# Patient Record
Sex: Male | Born: 1950
Health system: Southern US, Community
[De-identification: ages and names within clinical notes are randomized; demographics above are authoritative.]

## PROBLEM LIST (undated history)

## (undated) DIAGNOSIS — IMO0002 Reserved for concepts with insufficient information to code with codable children: Secondary | ICD-10-CM

## (undated) DIAGNOSIS — E785 Hyperlipidemia, unspecified: Secondary | ICD-10-CM

## (undated) DIAGNOSIS — D649 Anemia, unspecified: Secondary | ICD-10-CM

## (undated) DIAGNOSIS — N400 Enlarged prostate without lower urinary tract symptoms: Secondary | ICD-10-CM

## (undated) DIAGNOSIS — K449 Diaphragmatic hernia without obstruction or gangrene: Secondary | ICD-10-CM

## (undated) DIAGNOSIS — M1610 Unilateral primary osteoarthritis, unspecified hip: Secondary | ICD-10-CM

## (undated) DIAGNOSIS — R7309 Other abnormal glucose: Secondary | ICD-10-CM

## (undated) DIAGNOSIS — I2510 Atherosclerotic heart disease of native coronary artery without angina pectoris: Secondary | ICD-10-CM

## (undated) DIAGNOSIS — I10 Essential (primary) hypertension: Secondary | ICD-10-CM

## (undated) DIAGNOSIS — K219 Gastro-esophageal reflux disease without esophagitis: Secondary | ICD-10-CM

## (undated) DIAGNOSIS — M48061 Spinal stenosis, lumbar region without neurogenic claudication: Secondary | ICD-10-CM

## (undated) DIAGNOSIS — F528 Other sexual dysfunction not due to a substance or known physiological condition: Secondary | ICD-10-CM

## (undated) DIAGNOSIS — I739 Peripheral vascular disease, unspecified: Secondary | ICD-10-CM

## (undated) DIAGNOSIS — R011 Cardiac murmur, unspecified: Secondary | ICD-10-CM

## (undated) DIAGNOSIS — M169 Osteoarthritis of hip, unspecified: Secondary | ICD-10-CM

## (undated) DIAGNOSIS — I639 Cerebral infarction, unspecified: Secondary | ICD-10-CM

## (undated) DIAGNOSIS — K5730 Diverticulosis of large intestine without perforation or abscess without bleeding: Secondary | ICD-10-CM

## (undated) HISTORY — DX: Spinal stenosis, lumbar region without neurogenic claudication: M48.061

## (undated) HISTORY — DX: Other abnormal glucose: R73.09

## (undated) HISTORY — DX: Reserved for concepts with insufficient information to code with codable children: IMO0002

## (undated) HISTORY — DX: Diaphragmatic hernia without obstruction or gangrene: K44.9

## (undated) HISTORY — DX: Unilateral primary osteoarthritis, unspecified hip: M16.10

## (undated) HISTORY — DX: Hyperlipidemia, unspecified: E78.5

## (undated) HISTORY — DX: Anemia, unspecified: D64.9

## (undated) HISTORY — DX: Benign prostatic hyperplasia without lower urinary tract symptoms: N40.0

## (undated) HISTORY — DX: Atherosclerotic heart disease of native coronary artery without angina pectoris: I25.10

## (undated) HISTORY — DX: Essential (primary) hypertension: I10

## (undated) HISTORY — DX: Other sexual dysfunction not due to a substance or known physiological condition: F52.8

## (undated) HISTORY — DX: Cardiac murmur, unspecified: R01.1

## (undated) HISTORY — DX: Gastro-esophageal reflux disease without esophagitis: K21.9

## (undated) HISTORY — DX: Osteoarthritis of hip, unspecified: M16.9

## (undated) HISTORY — PX: UPPER GASTROINTESTINAL ENDOSCOPY: SHX188

## (undated) HISTORY — DX: Diverticulosis of large intestine without perforation or abscess without bleeding: K57.30

## (undated) HISTORY — DX: Peripheral vascular disease, unspecified: I73.9

---

## 2000-02-07 HISTORY — PX: COLONOSCOPY: SHX174

## 2001-11-01 ENCOUNTER — Ambulatory Visit (HOSPITAL_COMMUNITY): Admission: RE | Admit: 2001-11-01 | Discharge: 2001-11-01 | Payer: Self-pay | Admitting: Internal Medicine

## 2001-11-01 ENCOUNTER — Encounter: Payer: Self-pay | Admitting: Internal Medicine

## 2002-11-27 ENCOUNTER — Encounter: Payer: Self-pay | Admitting: Internal Medicine

## 2004-09-19 ENCOUNTER — Ambulatory Visit: Payer: Self-pay | Admitting: Internal Medicine

## 2004-12-12 ENCOUNTER — Ambulatory Visit: Payer: Self-pay | Admitting: Internal Medicine

## 2004-12-22 ENCOUNTER — Ambulatory Visit: Payer: Self-pay | Admitting: Internal Medicine

## 2005-03-28 ENCOUNTER — Ambulatory Visit: Payer: Self-pay | Admitting: Internal Medicine

## 2005-10-26 ENCOUNTER — Ambulatory Visit: Payer: Self-pay | Admitting: Internal Medicine

## 2006-08-03 ENCOUNTER — Encounter: Payer: Self-pay | Admitting: Internal Medicine

## 2007-01-22 ENCOUNTER — Ambulatory Visit: Payer: Self-pay | Admitting: Internal Medicine

## 2007-01-22 DIAGNOSIS — M1610 Unilateral primary osteoarthritis, unspecified hip: Secondary | ICD-10-CM | POA: Insufficient documentation

## 2007-01-22 DIAGNOSIS — I10 Essential (primary) hypertension: Secondary | ICD-10-CM

## 2007-01-22 DIAGNOSIS — N400 Enlarged prostate without lower urinary tract symptoms: Secondary | ICD-10-CM

## 2007-01-22 DIAGNOSIS — M169 Osteoarthritis of hip, unspecified: Secondary | ICD-10-CM

## 2007-01-22 DIAGNOSIS — E7849 Other hyperlipidemia: Secondary | ICD-10-CM

## 2007-01-22 DIAGNOSIS — F528 Other sexual dysfunction not due to a substance or known physiological condition: Secondary | ICD-10-CM

## 2007-01-28 ENCOUNTER — Encounter (INDEPENDENT_AMBULATORY_CARE_PROVIDER_SITE_OTHER): Payer: Self-pay | Admitting: *Deleted

## 2007-01-28 ENCOUNTER — Ambulatory Visit: Payer: Self-pay | Admitting: Internal Medicine

## 2007-02-04 ENCOUNTER — Encounter (INDEPENDENT_AMBULATORY_CARE_PROVIDER_SITE_OTHER): Payer: Self-pay | Admitting: *Deleted

## 2007-02-07 HISTORY — PX: TOTAL HIP ARTHROPLASTY: SHX124

## 2007-05-06 ENCOUNTER — Ambulatory Visit: Payer: Self-pay | Admitting: Internal Medicine

## 2007-05-06 DIAGNOSIS — K219 Gastro-esophageal reflux disease without esophagitis: Secondary | ICD-10-CM

## 2007-05-09 ENCOUNTER — Encounter (INDEPENDENT_AMBULATORY_CARE_PROVIDER_SITE_OTHER): Payer: Self-pay | Admitting: *Deleted

## 2007-05-15 ENCOUNTER — Ambulatory Visit: Payer: Self-pay | Admitting: Internal Medicine

## 2007-05-15 ENCOUNTER — Encounter (INDEPENDENT_AMBULATORY_CARE_PROVIDER_SITE_OTHER): Payer: Self-pay | Admitting: *Deleted

## 2007-05-15 LAB — CONVERTED CEMR LAB
OCCULT 1: NEGATIVE
OCCULT 2: NEGATIVE
OCCULT 3: NEGATIVE

## 2007-05-23 ENCOUNTER — Telehealth (INDEPENDENT_AMBULATORY_CARE_PROVIDER_SITE_OTHER): Payer: Self-pay | Admitting: *Deleted

## 2007-05-31 ENCOUNTER — Telehealth (INDEPENDENT_AMBULATORY_CARE_PROVIDER_SITE_OTHER): Payer: Self-pay | Admitting: *Deleted

## 2007-05-31 ENCOUNTER — Ambulatory Visit: Payer: Self-pay | Admitting: Gastroenterology

## 2007-06-05 ENCOUNTER — Encounter: Payer: Self-pay | Admitting: Internal Medicine

## 2007-06-05 ENCOUNTER — Ambulatory Visit: Payer: Self-pay | Admitting: Gastroenterology

## 2007-07-09 ENCOUNTER — Ambulatory Visit: Payer: Self-pay | Admitting: Internal Medicine

## 2007-07-10 ENCOUNTER — Ambulatory Visit: Payer: Self-pay | Admitting: Internal Medicine

## 2007-07-10 ENCOUNTER — Encounter: Payer: Self-pay | Admitting: Internal Medicine

## 2007-07-10 ENCOUNTER — Telehealth: Payer: Self-pay | Admitting: Internal Medicine

## 2007-07-10 LAB — CONVERTED CEMR LAB
ALT: 20 units/L (ref 0–53)
AST: 23 units/L (ref 0–37)
Alkaline Phosphatase: 89 units/L (ref 39–117)
BUN: 18 mg/dL (ref 6–23)
Basophils Relative: 0.5 % (ref 0.0–1.0)
CO2: 29 meq/L (ref 19–32)
Chloride: 107 meq/L (ref 96–112)
Creatinine, Ser: 1.2 mg/dL (ref 0.4–1.5)
Eosinophils Absolute: 0.2 10*3/uL (ref 0.0–0.7)
Eosinophils Relative: 2.8 % (ref 0.0–5.0)
GFR calc non Af Amer: 66 mL/min
Lymphocytes Relative: 20.9 % (ref 12.0–46.0)
MCV: 86.9 fL (ref 78.0–100.0)
Neutrophils Relative %: 66.9 % (ref 43.0–77.0)
Platelets: 281 10*3/uL (ref 150–400)
Potassium: 3.6 meq/L (ref 3.5–5.1)
RBC: 4.42 M/uL (ref 4.22–5.81)
Total Bilirubin: 0.8 mg/dL (ref 0.3–1.2)
WBC: 7.3 10*3/uL (ref 4.5–10.5)

## 2007-08-19 DIAGNOSIS — K2990 Gastroduodenitis, unspecified, without bleeding: Secondary | ICD-10-CM

## 2007-08-19 DIAGNOSIS — K5730 Diverticulosis of large intestine without perforation or abscess without bleeding: Secondary | ICD-10-CM | POA: Insufficient documentation

## 2007-08-19 DIAGNOSIS — K648 Other hemorrhoids: Secondary | ICD-10-CM | POA: Insufficient documentation

## 2007-08-19 DIAGNOSIS — K2970 Gastritis, unspecified, without bleeding: Secondary | ICD-10-CM | POA: Insufficient documentation

## 2007-08-19 DIAGNOSIS — K449 Diaphragmatic hernia without obstruction or gangrene: Secondary | ICD-10-CM | POA: Insufficient documentation

## 2007-08-20 ENCOUNTER — Ambulatory Visit: Payer: Self-pay | Admitting: Gastroenterology

## 2007-08-20 DIAGNOSIS — R079 Chest pain, unspecified: Secondary | ICD-10-CM

## 2007-08-22 ENCOUNTER — Ambulatory Visit: Payer: Self-pay | Admitting: Cardiovascular Disease

## 2007-08-28 ENCOUNTER — Encounter: Payer: Self-pay | Admitting: Internal Medicine

## 2007-08-28 ENCOUNTER — Ambulatory Visit: Payer: Self-pay

## 2007-08-28 ENCOUNTER — Ambulatory Visit: Payer: Self-pay | Admitting: Cardiology

## 2007-08-28 ENCOUNTER — Inpatient Hospital Stay (HOSPITAL_COMMUNITY): Admission: AD | Admit: 2007-08-28 | Discharge: 2007-08-29 | Payer: Self-pay | Admitting: Cardiovascular Disease

## 2007-09-04 ENCOUNTER — Ambulatory Visit: Payer: Self-pay | Admitting: Cardiovascular Disease

## 2007-09-10 ENCOUNTER — Ambulatory Visit: Payer: Self-pay | Admitting: Cardiovascular Disease

## 2007-09-10 ENCOUNTER — Ambulatory Visit (HOSPITAL_COMMUNITY): Admission: RE | Admit: 2007-09-10 | Discharge: 2007-09-11 | Payer: Self-pay | Admitting: Cardiovascular Disease

## 2007-09-25 ENCOUNTER — Ambulatory Visit: Payer: Self-pay | Admitting: Cardiovascular Disease

## 2007-10-10 ENCOUNTER — Encounter (INDEPENDENT_AMBULATORY_CARE_PROVIDER_SITE_OTHER): Payer: Self-pay | Admitting: *Deleted

## 2007-12-11 ENCOUNTER — Ambulatory Visit: Payer: Self-pay

## 2007-12-11 ENCOUNTER — Ambulatory Visit: Payer: Self-pay | Admitting: Cardiovascular Disease

## 2008-02-07 HISTORY — PX: CORONARY STENT PLACEMENT: SHX1402

## 2008-03-17 DIAGNOSIS — I2510 Atherosclerotic heart disease of native coronary artery without angina pectoris: Secondary | ICD-10-CM

## 2008-03-18 ENCOUNTER — Encounter: Payer: Self-pay | Admitting: Cardiovascular Disease

## 2008-03-18 ENCOUNTER — Ambulatory Visit: Payer: Self-pay | Admitting: Cardiovascular Disease

## 2008-07-29 ENCOUNTER — Ambulatory Visit: Payer: Self-pay | Admitting: Internal Medicine

## 2008-07-29 DIAGNOSIS — IMO0002 Reserved for concepts with insufficient information to code with codable children: Secondary | ICD-10-CM

## 2008-07-29 DIAGNOSIS — M25559 Pain in unspecified hip: Secondary | ICD-10-CM

## 2008-08-03 ENCOUNTER — Encounter: Payer: Self-pay | Admitting: Internal Medicine

## 2008-08-04 ENCOUNTER — Ambulatory Visit: Payer: Self-pay | Admitting: Internal Medicine

## 2008-08-05 ENCOUNTER — Telehealth: Payer: Self-pay | Admitting: Internal Medicine

## 2008-08-05 DIAGNOSIS — M48061 Spinal stenosis, lumbar region without neurogenic claudication: Secondary | ICD-10-CM | POA: Insufficient documentation

## 2008-08-27 ENCOUNTER — Encounter: Payer: Self-pay | Admitting: Cardiovascular Disease

## 2008-08-28 ENCOUNTER — Encounter: Payer: Self-pay | Admitting: Internal Medicine

## 2008-09-29 ENCOUNTER — Ambulatory Visit: Payer: Self-pay | Admitting: Cardiovascular Disease

## 2008-10-01 ENCOUNTER — Telehealth (INDEPENDENT_AMBULATORY_CARE_PROVIDER_SITE_OTHER): Payer: Self-pay

## 2008-10-05 ENCOUNTER — Ambulatory Visit: Payer: Self-pay

## 2008-10-05 ENCOUNTER — Encounter: Payer: Self-pay | Admitting: Cardiology

## 2008-10-13 ENCOUNTER — Inpatient Hospital Stay (HOSPITAL_COMMUNITY): Admission: RE | Admit: 2008-10-13 | Discharge: 2008-10-15 | Payer: Self-pay | Admitting: Orthopedic Surgery

## 2008-10-19 ENCOUNTER — Telehealth: Payer: Self-pay | Admitting: Cardiovascular Disease

## 2009-08-08 IMAGING — CR DG CHEST 2V
2 series · 2 of 2 positions shown · non-contrast
Comparison: Chest x-ray of 07/10/2007

CLINICAL DATA: Chest pain, post cardiac catheterization

CHEST - 2 VIEW

[w chest pa]
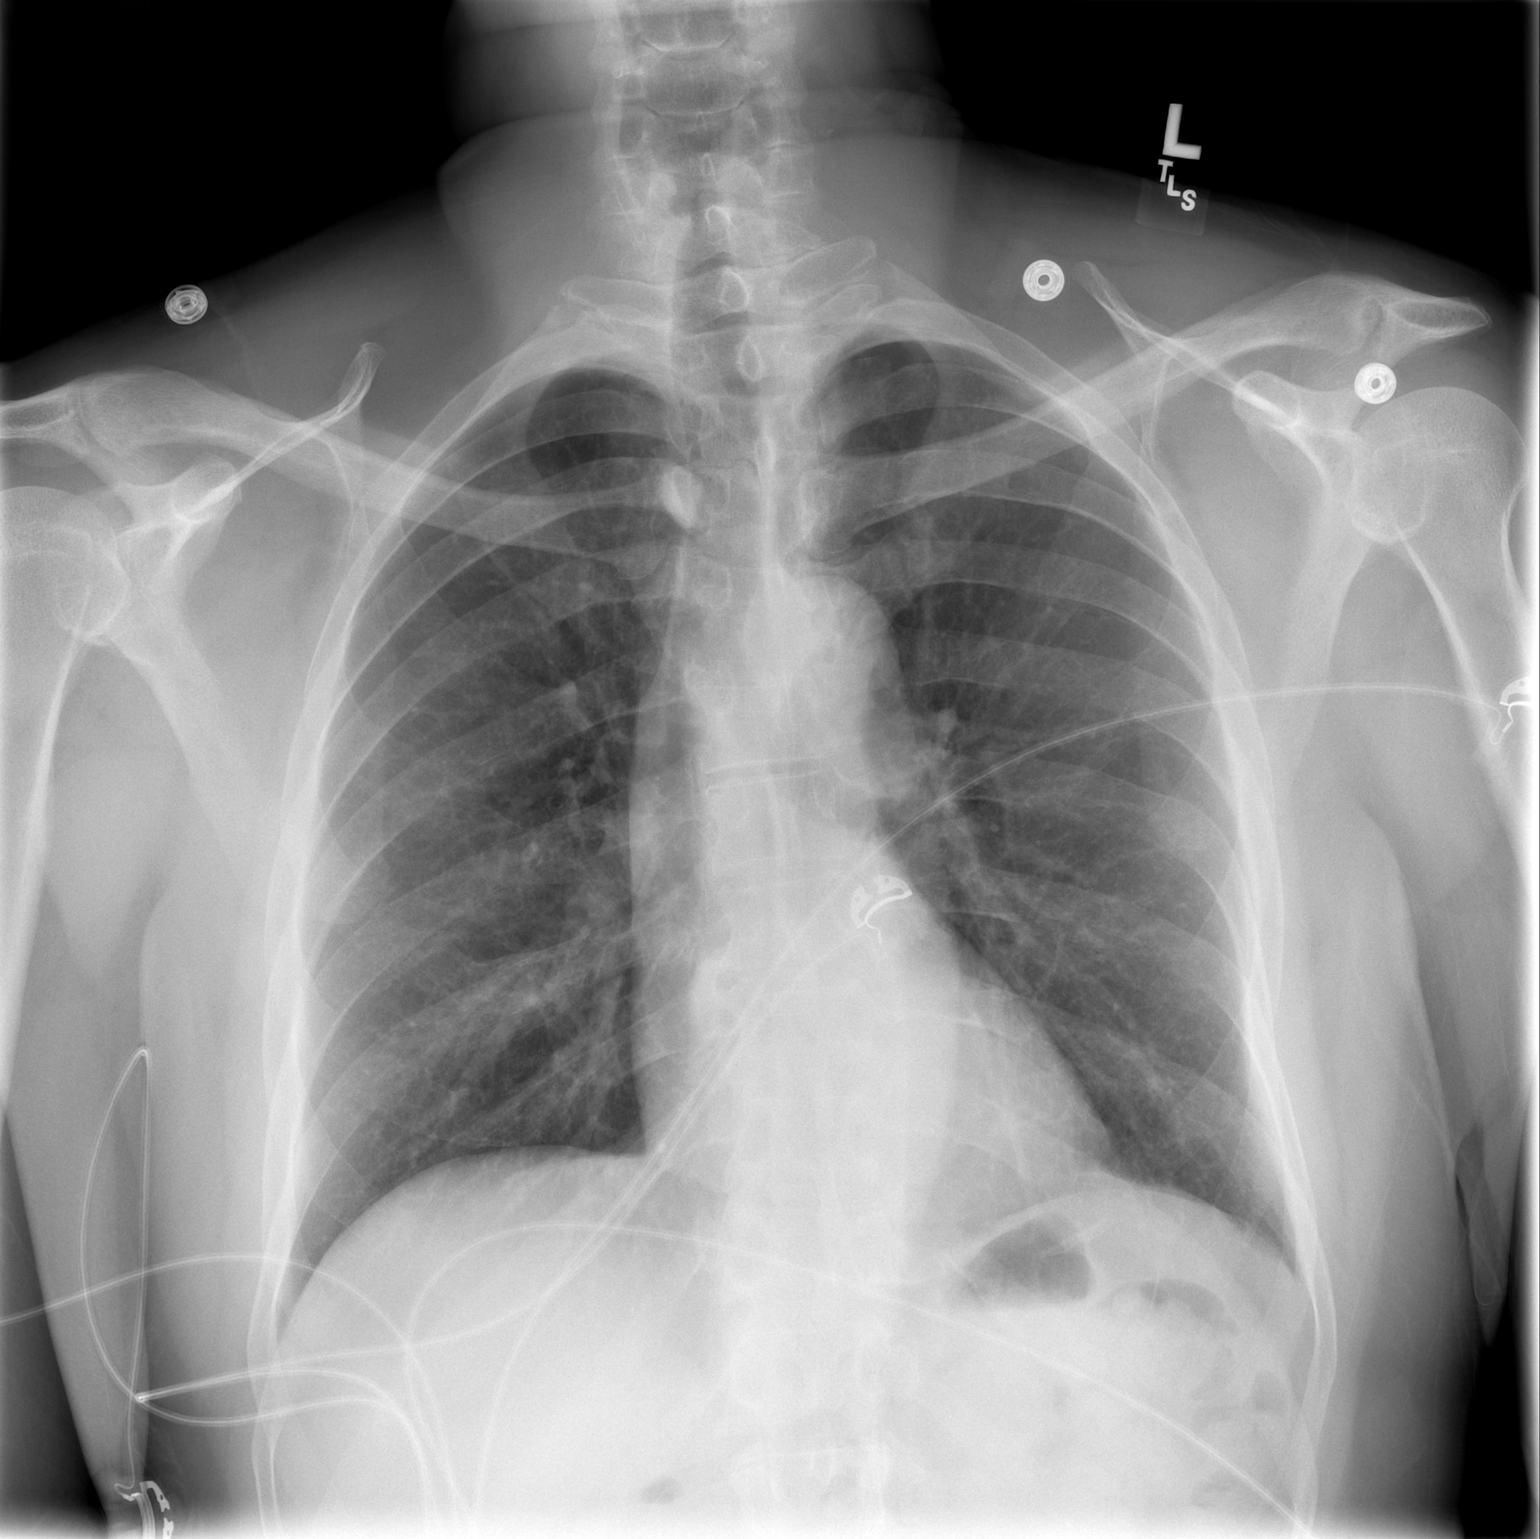

[w chest lat]
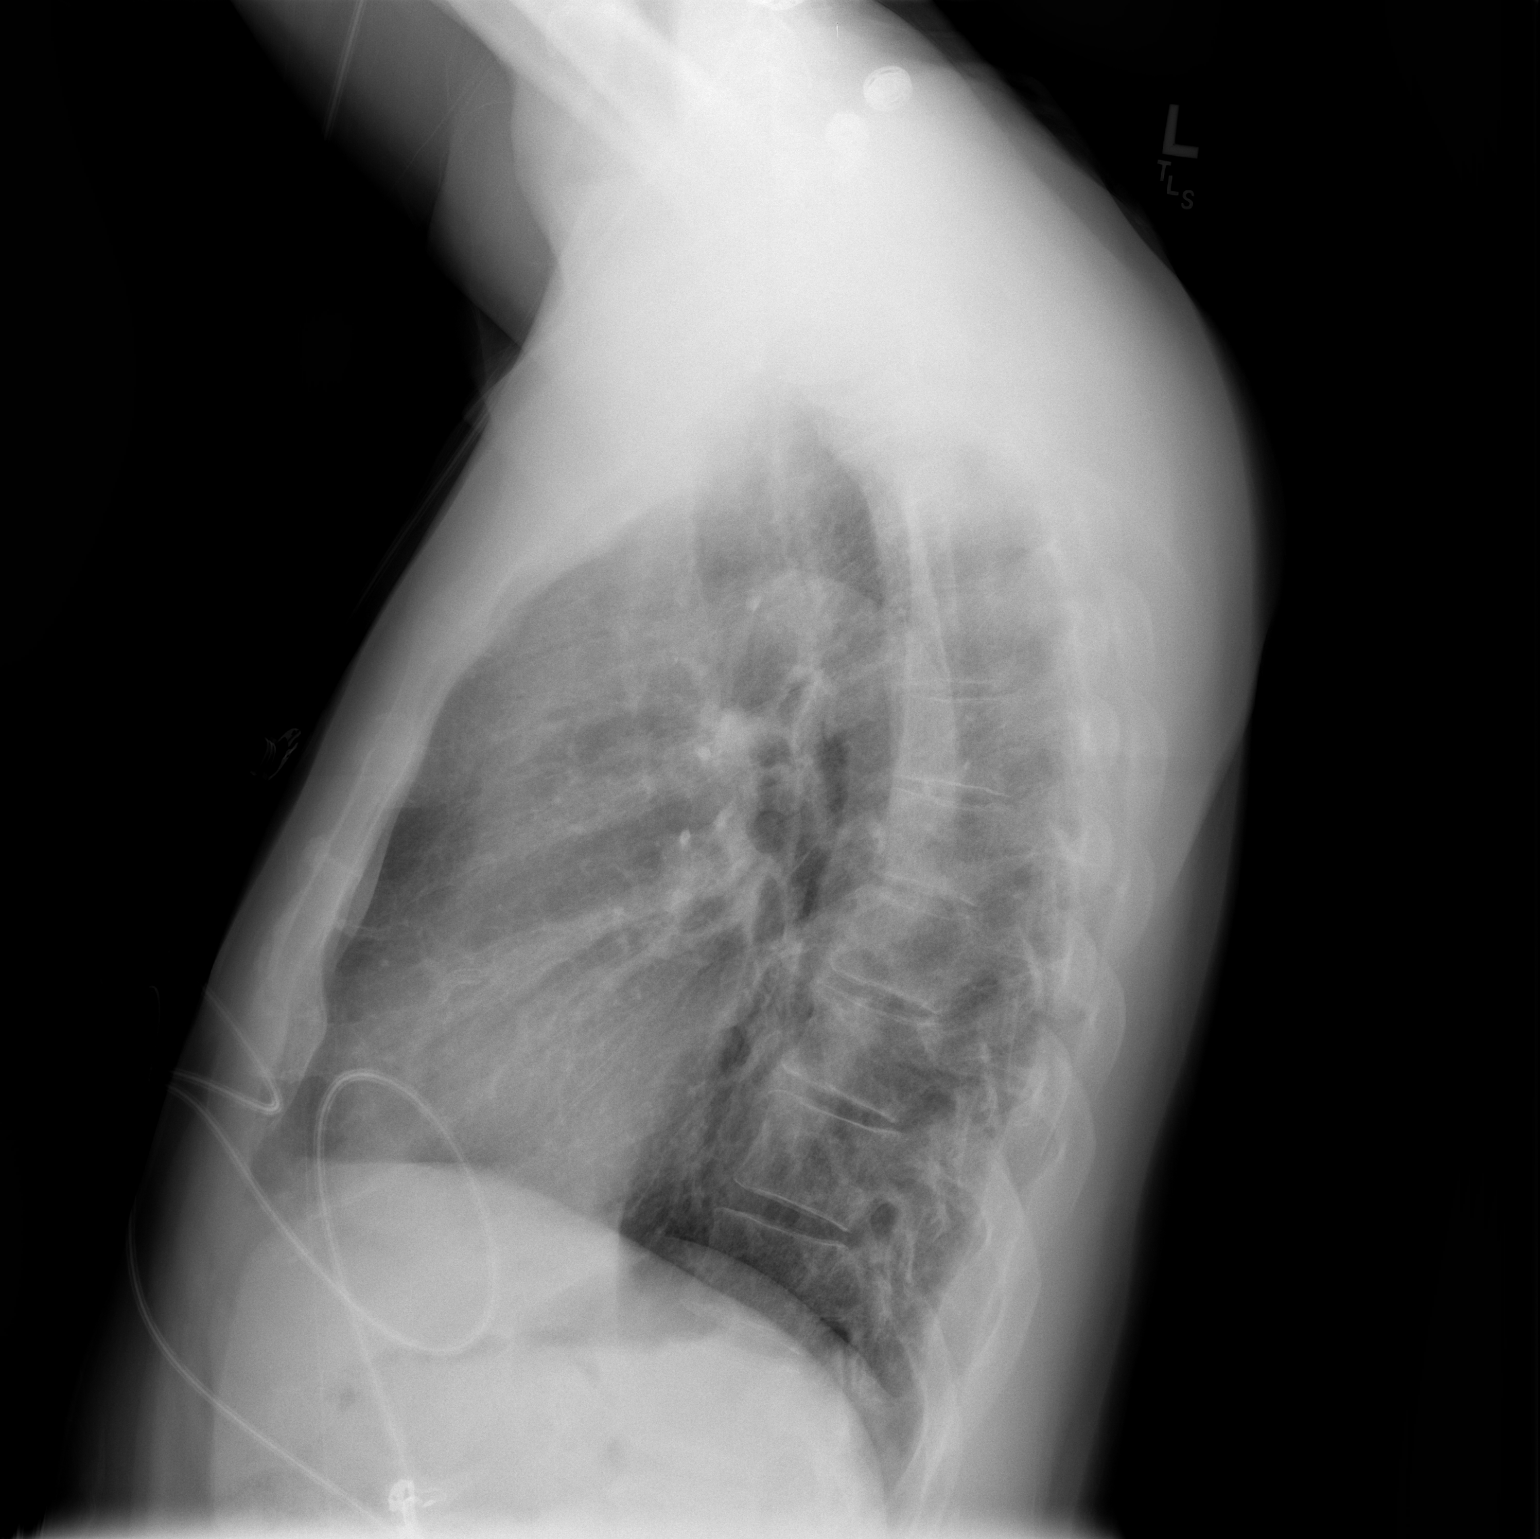

[2 of 2 positions shown; findings below may reference images not displayed]

FINDINGS: The lungs are clear.  The heart is within normal limits
in size.  No bony abnormality is seen other than degenerative
change in the thoracic spine.
IMPRESSION: Stable chest x-ray.  No active lung disease.

## 2009-11-15 ENCOUNTER — Encounter: Payer: Self-pay | Admitting: Internal Medicine

## 2009-11-15 ENCOUNTER — Ambulatory Visit: Payer: Self-pay | Admitting: Internal Medicine

## 2009-11-15 DIAGNOSIS — R3915 Urgency of urination: Secondary | ICD-10-CM | POA: Insufficient documentation

## 2009-11-15 LAB — CONVERTED CEMR LAB
Cholesterol, target level: 200 mg/dL
LDL Goal: 100 mg/dL

## 2009-11-17 ENCOUNTER — Ambulatory Visit: Payer: Self-pay | Admitting: Internal Medicine

## 2009-11-24 LAB — CONVERTED CEMR LAB
ALT: 21 units/L (ref 0–53)
AST: 25 units/L (ref 0–37)
Alkaline Phosphatase: 73 units/L (ref 39–117)
Basophils Relative: 0.3 % (ref 0.0–3.0)
Bilirubin, Direct: 0 mg/dL (ref 0.0–0.3)
Chloride: 110 meq/L (ref 96–112)
Eosinophils Absolute: 0.1 10*3/uL (ref 0.0–0.7)
Eosinophils Relative: 1.3 % (ref 0.0–5.0)
GFR calc non Af Amer: 88.66 mL/min (ref >60)
Lymphocytes Relative: 20.8 % (ref 12.0–46.0)
MCHC: 33 g/dL (ref 30.0–36.0)
Monocytes Relative: 7.5 % (ref 3.0–12.0)
Neutrophils Relative %: 70.1 % (ref 43.0–77.0)
PSA: 2.71 ng/mL (ref 0.10–4.00)
Potassium: 4.9 meq/L (ref 3.5–5.1)
RBC: 4.25 M/uL (ref 4.22–5.81)
Sodium: 144 meq/L (ref 135–145)
Specific Gravity, Urine: >=1.03 (ref 1.000–1.030)
TSH: 0.99 microintl units/mL (ref 0.35–5.50)
Total Bilirubin: 0.4 mg/dL (ref 0.3–1.2)
Total CHOL/HDL Ratio: 4
Urine Glucose: NEGATIVE mg/dL
Urobilinogen, UA: 0.2 (ref 0.0–1.0)
VLDL: 10 mg/dL (ref 0.0–40.0)
WBC: 6.3 10*3/uL (ref 4.5–10.5)
pH: 6 (ref 5.0–8.0)

## 2010-02-02 ENCOUNTER — Ambulatory Visit
Admission: RE | Admit: 2010-02-02 | Discharge: 2010-02-02 | Payer: Self-pay | Source: Home / Self Care | Attending: Internal Medicine | Admitting: Internal Medicine

## 2010-02-02 DIAGNOSIS — H9190 Unspecified hearing loss, unspecified ear: Secondary | ICD-10-CM | POA: Insufficient documentation

## 2010-02-02 DIAGNOSIS — R634 Abnormal weight loss: Secondary | ICD-10-CM

## 2010-02-02 DIAGNOSIS — H6120 Impacted cerumen, unspecified ear: Secondary | ICD-10-CM | POA: Insufficient documentation

## 2010-02-02 DIAGNOSIS — R7309 Other abnormal glucose: Secondary | ICD-10-CM | POA: Insufficient documentation

## 2010-02-02 DIAGNOSIS — R350 Frequency of micturition: Secondary | ICD-10-CM

## 2010-02-02 DIAGNOSIS — D649 Anemia, unspecified: Secondary | ICD-10-CM | POA: Insufficient documentation

## 2010-02-04 LAB — CONVERTED CEMR LAB
Basophils Absolute: 0 10*3/uL (ref 0.0–0.1)
Eosinophils Absolute: 0.2 10*3/uL (ref 0.0–0.7)
Folate: 9.7 ng/mL
HCT: 40.6 % (ref 39.0–52.0)
Lymphs Abs: 1.4 10*3/uL (ref 0.7–4.0)
MCHC: 33.3 g/dL (ref 30.0–36.0)
MCV: 87.6 fL (ref 78.0–100.0)
Monocytes Absolute: 0.6 10*3/uL (ref 0.1–1.0)
Neutrophils Relative %: 64.5 % (ref 43.0–77.0)
Platelets: 305 10*3/uL (ref 150.0–400.0)
RDW: 14.4 % (ref 11.5–14.6)
Saturation Ratios: 23.6 % (ref 20.0–50.0)
Vitamin B-12: 640 pg/mL (ref 211–911)
WBC: 6 10*3/uL (ref 4.5–10.5)

## 2010-02-08 ENCOUNTER — Ambulatory Visit
Admission: RE | Admit: 2010-02-08 | Discharge: 2010-02-08 | Payer: Self-pay | Source: Home / Self Care | Attending: Internal Medicine | Admitting: Internal Medicine

## 2010-02-08 ENCOUNTER — Encounter (INDEPENDENT_AMBULATORY_CARE_PROVIDER_SITE_OTHER): Payer: Self-pay | Admitting: *Deleted

## 2010-02-08 LAB — CONVERTED CEMR LAB
OCCULT 2: NEGATIVE
OCCULT 3: NEGATIVE

## 2010-03-06 LAB — CONVERTED CEMR LAB
ALT: 19 units/L (ref 0–53)
Albumin: 3.7 g/dL (ref 3.5–5.2)
Alkaline Phosphatase: 86 units/L (ref 39–117)
BUN: 18 mg/dL (ref 6–23)
Basophils Absolute: 0 10*3/uL (ref 0.0–0.1)
Basophils Relative: 0.4 % (ref 0.0–1.0)
CO2: 32 meq/L (ref 19–32)
Calcium: 9.9 mg/dL (ref 8.4–10.5)
Cholesterol: 219 mg/dL (ref 0–200)
Eosinophils Absolute: 0.3 10*3/uL (ref 0.0–0.6)
GFR calc Af Amer: 89 mL/min
GFR calc non Af Amer: 73 mL/min
HDL: 41.8 mg/dL (ref >39.0)
Lymphocytes Relative: 23 % (ref 12.0–46.0)
Monocytes Relative: 9.6 % (ref 3.0–11.0)
Neutro Abs: 4.3 10*3/uL (ref 1.4–7.7)
Platelets: 266 10*3/uL (ref 150–400)
TSH: 1.82 microintl units/mL (ref 0.35–5.50)
VLDL: 15 mg/dL (ref 0–40)

## 2010-03-08 NOTE — Assessment & Plan Note (Signed)
Summary: CPX///SPH   Vital Signs:  Patient profile:   60 year old male Height:      73 inches Weight:      192.4 pounds BMI:     25.48 Temp:     98.7 degrees F oral Pulse rate:   76 / minute Resp:     14 per minute BP sitting:   138 / 84  Vitals Entered By: Shonna Chock CMA (November 15, 2009 2:20 PM)   Serial Vital Signs/Assessments:  Time      Position  BP       Pulse  Resp  Temp     By           L Arm     138/84                         Chrae Malloy CMA           R Leg     136/84                         Chrae Malloy CMA  CC: CPX and renew rx for nitroglycerin, General Medical Evaluation, Lipid Management   Primary Care Provider:  Marga Melnick MD  CC:  CPX and renew rx for nitroglycerin, General Medical Evaluation, and Lipid Management.  History of Present Illness: Eric Dickerson is here for a physical; he has  had urinary frequency for 2 years.He has nocturia 1X / night.  Lipid Management History:      Positive NCEP/ATP III risk factors include male age 15 years old or older, family history for ischemic heart disease (males less than 21 years old), hypertension, and ASHD (either angina/prior MI/prior CABG).  Negative NCEP/ATP III risk factors include non-diabetic, non-tobacco-user status, no prior stroke/TIA, no peripheral vascular disease, and no history of aortic aneurysm.     Current Medications (verified): 1)  Plavix 75 Mg Tabs (Clopidogrel Bisulfate) .Marland Kitchen.. 1 Daily 2)  Lopressor 50 Mg Tabs (Metoprolol Tartrate) .... 1/2 Tab Bid 3)  Nitroglycerin 0.4 Mg Subl (Nitroglycerin) .... As Needed  Allergies: 1)  ! Verapamil 2)  ! * Toprol 3)  ! * Benicar 4)  ! Accupril 5)  ! * Labetalol 6)  ! * Shellfish 7)  ! Glucosamine 8)  ! Crestor (Rosuvastatin Calcium)  Past History:  Past Medical History: CAD:  PCI DES RCA 08/2007 .  PCI DES Ramus 09/2007 hiatal hernia gastritis with  anemia  DEGENERATIVE JOINT DISEASE, RIGHT HIP (ICD-715.95) HYPERPLASIA PROSTATE UNS W/O UR  OBST & OTH LUTS (ICD-600.90) HYPERTENSION, ESSENTIAL NOS (ICD-401.9) HYPERLIPIDEMIA (ICD-272.2): NMR 2004: TC 206, Tg 65, HDL 37, LDL 156.[place] Study  LDL goal = < 100 ERECTILE DYSFUNCTION (ICD-302.72)  Past Surgical History: Colonoscopy: negative 2002 PTCA/stent X 2  in 2009  Family History: maternal grandmother: PVD father: htn, MI @  age 43 smoker  brothers:htn,cva ages 68 & 65 brother: died PTE No FH of Colon Cancer:  Social History: Former Smoker quit 1987- smoked over 25 yrs @  1ppd Alcohol use-no Married  4 children Occupation: Quarry manager Patient does not get regular exercise except @ work  Review of Systems  The patient denies anorexia, fever, weight loss, weight gain, vision loss, decreased hearing, hoarseness, chest pain, syncope, dyspnea on exertion, peripheral edema, prolonged cough, headaches, hemoptysis, abdominal pain, melena, hematochezia, severe indigestion/heartburn, suspicious skin lesions, depression, unusual weight change, abnormal bleeding, enlarged lymph nodes, and angioedema.  Blurred vision , ? due to Plavix. GU:  Denies discharge, dysuria, hematuria, and incontinence.  Physical Exam  General:  well-nourished,alert,appropriate and cooperative throughout examination Head:  Normocephalic and atraumatic without obvious abnormalities. No apparent alopecia; goatee Eyes:  No corneal or conjunctival inflammation noted.  Perrla. Funduscopic exam benign, without hemorrhages, exudates or papilledema. Arcus senilis Ears:  External ear exam shows no significant lesions or deformities.  wax on L Hearing is grossly normal bilaterally. Nose:  External nasal examination shows no deformity or inflammation. Nasal mucosa are pink and moist without lesions or exudates. Mouth:  Oral mucosa and oropharynx without lesions or exudates.  Teeth in good repair. Neck:  No deformities, masses, or tenderness noted. Lungs:  Normal respiratory effort, chest  expands symmetrically. Lungs are clear to auscultation, no crackles or wheezes. Heart:  normal rate, regular rhythm, no gallop, no rub, no JVD, no HJR, and grade 1/2 -1 /6 systolic murmur.   Abdomen:  Bowel sounds positive,abdomen soft and non-tender without masses, organomegaly or hernias noted. Rectal:  No external abnormalities noted. Normal sphincter tone. No rectal masses or tenderness. Genitalia:  Testes bilaterally descended without nodularity, tenderness or masses. No scrotal masses or lesions. No penis lesions or urethral discharge. L varicocele.   Prostate:  Prostate gland firm and smooth, no enlargemen but ULN w/o  nodularity, tenderness, mass, asymmetry or induration. Msk:  No deformity or scoliosis noted of thoracic or lumbar spine.   Pulses:  R and L carotid,radial,dorsalis pedis and posterior tibial pulses are full and equal bilaterally Extremities:  No clubbing, cyanosis, edema, or deformity noted with normal full range of motion of all joints.   Neurologic:  alert & oriented X3 and DTRs symmetrical and normal.   Skin:  Intact without suspicious lesions or rashes Cervical Nodes:  No lymphadenopathy noted Axillary Nodes:  No palpable lymphadenopathy Inguinal Nodes:  No significant adenopathy Psych:  memory intact for recent and remote, normally interactive, and good eye contact.     Impression & Recommendations:  Problem # 1:  ROUTINE GENERAL MEDICAL EXAM@HEALTH  CARE FACL (ICD-V70.0)  Orders: EKG w/ Interpretation (93000)  Problem # 2:  URINARY URGENCY (ZOX-096.04)  Problem # 3:  CORONARY ATHEROSCLEROSIS NATIVE CORONARY ARTERY (ICD-414.01) S/P stent X 2 The following medications were removed from the medication list:    Hydrochlorothiazide 25 Mg Tabs (Hydrochlorothiazide) His updated medication list for this problem includes:    Plavix 75 Mg Tabs (Clopidogrel bisulfate) .Marland Kitchen... 1 daily    Lopressor 50 Mg Tabs (Metoprolol tartrate) .Marland Kitchen... 1/2 tab bid    Nitroglycerin 0.4  Mg Subl (Nitroglycerin) .Marland Kitchen... As needed  Problem # 4:  HYPERTENSION, ESSENTIAL NOS (ICD-401.9)  The following medications were removed from the medication list:    Hydrochlorothiazide 25 Mg Tabs (Hydrochlorothiazide) His updated medication list for this problem includes:    Lopressor 50 Mg Tabs (Metoprolol tartrate) .Marland Kitchen... 1/2 tab bid  Problem # 5:  HYPERLIPIDEMIA (ICD-272.2) Crestor intolerance ( myalgias)  Complete Medication List: 1)  Plavix 75 Mg Tabs (Clopidogrel bisulfate) .Marland Kitchen.. 1 daily 2)  Lopressor 50 Mg Tabs (Metoprolol tartrate) .... 1/2 tab bid 3)  Nitroglycerin 0.4 Mg Subl (Nitroglycerin) .... As needed  Other Orders: Tdap => 44yrs IM (54098) Admin 1st Vaccine (11914)  Lipid Assessment/Plan:      Based on NCEP/ATP III, the patient's risk factor category is "history of coronary disease, peripheral vascular disease, cerebrovascular disease, or aortic aneurysm".  The patient's lipid goals are as follows: Total cholesterol goal is 200; LDL cholesterol  goal is 100; HDL cholesterol goal is 40; Triglyceride goal is 150.    Patient Instructions: 1)  Please scedule fasting labs @ [facility]; see Diagnoses for Codes::  2)  BMP ; 3)  Hepatic Panel ; 4)  Lipid Panel ; 5)  TSH ; 6)  CBC w/ Diff; 7)  Urine-dip with micro 8)  PSA; 9)  HbgA1C.   Immunizations Administered:  Tetanus Vaccine:    Vaccine Type: Tdap    Site: right deltoid    Mfr: GlaxoSmithKline    Dose: 0.5 ml    Route: IM    Given by: Shonna Chock CMA    Exp. Date: 11/26/2011    Lot #: [id number]    VIS given: 12/25/07 version given November 15, 2009.    Appended Document: CPX///SPH Prescriptions: NITROGLYCERIN 0.4 MG SUBL (NITROGLYCERIN) as needed  #30 x 1   Entered by:   Shonna Chock CMA   Authorized by:   Marga Melnick MD   Signed by:   Shonna Chock CMA on 11/15/2009   Method used:   Electronically to        [facility]       [address]       [place], [place]  [address]       Ph: [phone]       Fax: [phone]   RxID:   [id number]

## 2010-03-10 NOTE — Assessment & Plan Note (Signed)
Summary: UNABLE TO HEAR OUT OF EAR/KN   Vital Signs:  Patient profile:   60 year old male Weight:      185.8 pounds BMI:     24.60 Temp:     98.5 degrees F oral Pulse rate:   60 / minute Resp:     14 per minute BP sitting:   124 / 82  (left arm) Cuff size:   large  Vitals Entered By: Shonna Chock CMA (February 02, 2010 10:26 AM) CC: Left ear concerns, left ear feels blocked. Symptoms onset after having teeth pulled 3 weeks ago. , Ear pain Comments I examined left ear and it was blocked with waxed./Chrae [facility] CMA  February 02, 2010 10:27 AM    Primary Care Provider:  Marga Melnick MD  CC:  Left ear concerns, left ear feels blocked. Symptoms onset after having teeth pulled 3 weeks ago. , and Ear pain.  History of Present Illness:      This is a 60 year old man who presents with ear  symptoms since 12/23 in the L ear.  The patient also reports hearing loss and tinnitus, but denies ear discharge, fever, and sinus pain.  The ear was soaked & large wax /cotton impaction removed. Hearing normal to whisper @ 6 ft  & TM after removal.                        Weight loss down 6 # since 11/2009; down 26 # over past year. Mild anemia , normal TSH  & PSA and borderline DM  ( A1c 6.2%).were  present; F/U not completed. He is having serious caries addressed. See ROS.  Current Medications (verified): 1)  Plavix 75 Mg Tabs (Clopidogrel Bisulfate) .Marland Kitchen.. 1 Daily 2)  Lopressor 50 Mg Tabs (Metoprolol Tartrate) .... 1/2 Tab Bid 3)  Nitroglycerin 0.4 Mg Subl (Nitroglycerin) .... As Needed  Allergies: 1)  ! Verapamil 2)  ! * Toprol 3)  ! * Benicar 4)  ! Accupril 5)  ! * Labetalol 6)  ! * Shellfish 7)  ! Glucosamine 8)  ! Crestor (Rosuvastatin Calcium)  Review of Systems General:  Denies chills, fever, and sweats. ENT:  Denies difficulty swallowing and hoarseness. CV:  Denies palpitations. GI:  Denies abdominal pain, bloody stools, dark tarry stools, diarrhea, indigestion, loss of appetite,  nausea, and vomiting. GU:  Complains of nocturia and urinary frequency; denies discharge, dysuria, and hematuria; Nocturia X 4 , "every 2 hrs". Derm:  Denies changes in nail beds, dryness, and hair loss. Neuro:  Denies numbness and tingling. Endo:  Complains of excessive urination; denies cold intolerance, excessive hunger, excessive thirst, and heat intolerance. Heme:  Denies abnormal bruising and bleeding.  Physical Exam  General:  Thin but in no acute distress; alert,appropriate and cooperative throughout examination Ears:  R ear normal and L Cerumen impaction.   Nose:  External nasal examination shows no deformity or inflammation. Nasal mucosa are pink and moist without lesions or exudates. Mouth:  Oral mucosa and oropharynx without lesions or exudates.  Lowr teeth in  fair repair; all upper teeth extracted. Neck:  No deformities, masses, or tenderness noted. Lungs:  Normal respiratory effort, chest expands symmetrically. Lungs are clear to auscultation, no crackles or wheezes. Heart:  Normal rate and regular rhythm. S1 and S2 normal without gallop, murmur, click, rub .S4 Abdomen:  Bowel sounds positive,abdomen soft and non-tender without masses, organomegaly or hernias noted. Neurologic:  alert & oriented X3 and  DTRs symmetrical and normal.   Skin:  Intact without suspicious lesions or rashes Cervical Nodes:  No lymphadenopathy noted Axillary Nodes:  No palpable lymphadenopathy Psych:  Oriented X3 and subdued.     Impression & Recommendations:  Problem # 1:  HEARING LOSS, LEFT EAR (ICD-389.9) due to #2  Problem # 2:  CERUMEN IMPACTION, LEFT (ICD-380.4)  Problem # 3:  WEIGHT LOSS (ICD-783.21)  ? role of caries  Orders: Venipuncture (40981) TLB-TSH (Thyroid Stimulating Hormone) (84443-TSH) TLB-A1C / Hgb A1C (Glycohemoglobin) (83036-A1C)  Problem # 4:  URINARY URGENCY (XBJ-478.29)  Problem # 5:  ANEMIA, MILD (ICD-285.9)  Orders: Venipuncture (56213) TLB-CBC Platelet -  w/Differential (85025-CBCD) TLB-B12 + Folate Pnl (08657_84696-E95/MWU) TLB-IBC Pnl (Iron/FE;Transferrin) (83550-IBC)  Complete Medication List: 1)  Plavix 75 Mg Tabs (Clopidogrel bisulfate) .Marland Kitchen.. 1 daily 2)  Lopressor 50 Mg Tabs (Metoprolol tartrate) .... 1/2 tab bid 3)  Nitroglycerin 0.4 Mg Subl (Nitroglycerin) .... As needed  Patient Instructions: 1)  Complete stool cards. Ear Hygiene as discussed. No Q Tips !    Orders Added: 1)  Est. Patient Level IV [13244] 2)  Venipuncture [01027] 3)  TLB-TSH (Thyroid Stimulating Hormone) [84443-TSH] 4)  TLB-CBC Platelet - w/Differential [85025-CBCD] 5)  TLB-B12 + Folate Pnl [82746_82607-B12/FOL] 6)  TLB-IBC Pnl (Iron/FE;Transferrin) [83550-IBC] 7)  TLB-A1C / Hgb A1C (Glycohemoglobin) [83036-A1C]  Appended Document: Orders Update    Clinical Lists Changes  Orders: Added new Service order of UA Dipstick w/o Micro (manual) (25366) - Signed Observations: Added new observation of PH URINE: 5.0  (02/02/2010 11:29) Added new observation of SPEC GR URIN: 1.010  (02/02/2010 11:29) Added new observation of UA COLOR: yellow  (02/02/2010 11:29) Added new observation of APPEARANCE U: Clear  (02/02/2010 11:29) Added new observation of WBC DIPSTK U: negative  (02/02/2010 11:29) Added new observation of NITRITE URN: negative  (02/02/2010 11:29) Added new observation of UROBILINOGEN: 0.2  (02/02/2010 11:29) Added new observation of PROTEIN, URN: negative  (02/02/2010 11:29) Added new observation of BLOOD UR DIP: negative  (02/02/2010 11:29) Added new observation of KETONES URN: negative  (02/02/2010 11:29) Added new observation of BILIRUBIN UR: negative  (02/02/2010 11:29) Added new observation of GLUCOSE, URN: negative  (02/02/2010 11:29)      Laboratory Results   Urine Tests   Date/Time Reported: February 02, 2010 11:29 AM  Routine Urinalysis   Color: yellow Appearance: Clear Glucose: negative   (Normal Range: Negative) Bilirubin:  negative   (Normal Range: Negative) Ketone: negative   (Normal Range: Negative) Spec. Gravity: 1.010   (Normal Range: 1.003-1.035) Blood: negative   (Normal Range: Negative) pH: 5.0   (Normal Range: 5.0-8.0) Protein: negative   (Normal Range: Negative) Urobilinogen: 0.2   (Normal Range: 0-1) Nitrite: negative   (Normal Range: Negative) Leukocyte Esterace: negative   (Normal Range: Negative)

## 2010-03-10 NOTE — Letter (Signed)
Summary: Results Follow up Letter  [facility] [place]  [address] [place], [place] [address]   Phone: [phone]  Fax: [phone]    02/08/2010 MRN: [id number]  [facility] [address] [place], [place]  [address]  Dear Eric Dickerson,  The following are the results of your recent test(s):  Test         Result    Pap Smear:        Normal _____  Not Normal _____ Comments: ______________________________________________________ Cholesterol: LDL(Bad cholesterol):         Your goal is less than:         HDL (Good cholesterol):       Your goal is more than: Comments:  ______________________________________________________ Mammogram:        Normal _____  Not Normal _____ Comments:  ___________________________________________________________________ Hemoccult:        Normal _X____  Not normal _______ Comments:    _____________________________________________________________________ Other Tests:    We routinely do not discuss normal results over the telephone.  If you desire a copy of the results, or you have any questions about this information we can discuss them at your next office visit.   Sincerely,

## 2010-03-22 ENCOUNTER — Encounter: Payer: Self-pay | Admitting: Cardiovascular Disease

## 2010-03-22 ENCOUNTER — Ambulatory Visit (INDEPENDENT_AMBULATORY_CARE_PROVIDER_SITE_OTHER): Payer: BC Managed Care – PPO | Admitting: Cardiovascular Disease

## 2010-03-22 DIAGNOSIS — I2510 Atherosclerotic heart disease of native coronary artery without angina pectoris: Secondary | ICD-10-CM

## 2010-03-22 DIAGNOSIS — I119 Hypertensive heart disease without heart failure: Secondary | ICD-10-CM

## 2010-03-30 NOTE — Assessment & Plan Note (Signed)
Summary: rov/meds/per pt call=mj appt confirm=mj   Primary Provider:  Marga Melnick MD  CC:  check up.  History of Present Illness: Eric Dickerson is seen today for F/U of CAD    He had stenting of the RCA in 08/2007, and staged stenting of the Ramus in 09/2007.  He has arthritis with bilateral hip and back pain that is worsening.  S/P right hip replaccement in 2011 with no complications   He has not had any sifnificant SSCP.  He has mild exertional dyspnea.  He has not had any palpitaions PND, orthopnea or syncope.   Last myovue 8/10 with old anterior infarct no ischemia EF 63%. BP high in office today.  He takes it at home regullarly and assures me that systolics usually run in the 120's and diastolics less than 80  Had all his teeth pulled and bled from his Plavix.  Stents are 61 years old.  Told him it is ok to take 1/2 Plavix tablet/day  Also previously intolerant to crestor and has not wanted to be started on another statin.  Current Problems (verified): 1)  Anemia, Mild  (ICD-285.9) 2)  Pre-diabetes  (ICD-790.29) 3)  Cerumen Impaction, Left  (ICD-380.4) 4)  Hearing Loss, Left Ear  (ICD-389.9) 5)  Urinary Frequency  (ICD-788.41) 6)  Weight Loss  (ICD-783.21) 7)  Urinary Urgency  (IHK-742.59) 8)  Spinal Stenosis, Lumbar  (ICD-724.02) 9)  Lumbar Radiculopathy, Right  (ICD-724.4) 10)  Hip Pain, Right  (ICD-719.45) 11)  Coronary Atherosclerosis Native Coronary Artery  (ICD-414.01) 12)  Chest Pain  (ICD-786.50) 13)  Gastritis  (ICD-535.50) 14)  Hiatal Hernia  (ICD-553.3) 15)  Hemorrhoids, Internal  (ICD-455.0) 16)  Diverticulosis, Colon  (ICD-562.10) 17)  Gerd  (ICD-530.81) 18)  Degenerative Joint Disease, Right Hip  (ICD-715.95) 19)  Hyperplasia Prostate Uns w/o Ur Obst & Oth Luts  (ICD-600.90) 20)  Hypertension, Essential Nos  (ICD-401.9) 21)  Hyperlipidemia  (ICD-272.2) 22)  Erectile Dysfunction  (ICD-302.72)  Current Medications (verified): 1)  Plavix 75 Mg Tabs (Clopidogrel Bisulfate)  .... One Half Tablet By Mouth Once Daily 2)  Lopressor 50 Mg Tabs (Metoprolol Tartrate) .... 1/2 Tab Bid 3)  Nitroglycerin 0.4 Mg Subl (Nitroglycerin) .... As Needed  Allergies (verified): 1)  ! Verapamil 2)  ! * Toprol 3)  ! * Benicar 4)  ! Accupril 5)  ! * Labetalol 6)  ! * Shellfish 7)  ! Glucosamine 8)  ! Crestor (Rosuvastatin Calcium)  Past History:  Past Medical History: Last updated: 11/15/2009 CAD:  PCI DES RCA 08/2007 .  PCI DES Ramus 09/2007 hiatal hernia gastritis with  anemia  DEGENERATIVE JOINT DISEASE, RIGHT HIP (ICD-715.95) HYPERPLASIA PROSTATE UNS W/O UR OBST & OTH LUTS (ICD-600.90) HYPERTENSION, ESSENTIAL NOS (ICD-401.9) HYPERLIPIDEMIA (ICD-272.2): NMR 2004: TC 206, Tg 65, HDL 37, LDL 156.[place] Study  LDL goal = < 100 ERECTILE DYSFUNCTION (ICD-302.72)  Past Surgical History: Last updated: 11/15/2009 Colonoscopy: negative 2002 PTCA/stent X 2  in 2009  Family History: Last updated: 11/15/2009 maternal grandmother: PVD father: htn, MI @  age 3 smoker  brothers:htn,cva ages 69 & 31 brother: died PTE No FH of Colon Cancer:  Social History: Last updated: 11/15/2009 Former Smoker quit 1987- smoked over 25 yrs @  1ppd Alcohol use-no Married  4 children 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   Vital Signs:  Patient profile:   60 year old male Height:      73 inches Weight:      183 pounds BMI:     24.23 Pulse rate:   71 / minute Resp:     14 per minute BP sitting:   148 / 89  (left arm)  Vitals Entered By: Kem Parkinson (March 22, 2010 3:25 PM)  Physical Exam  General:  Affect appropriate Healthy:  appears stated age HEENT: normal Neck supple with  no adenopathy JVP normal no bruits no thyromegaly Lungs clear with no wheezing and good diaphragmatic motion Heart:  S1/S2 no murmur,rub, gallop or click PMI normal Abdomen: benighn, BS positve, no tenderness, no AAA no bruit.  No HSM or HJR Distal pulses intact with no bruits No edema Neuro non-focal Skin warm and dry    Impression & Recommendations:  Problem # 1:  CORONARY ATHEROSCLEROSIS NATIVE CORONARY ARTERY (ICD-414.01) Stable no angina  Continue BB and antiplatlets His updated medication list for this problem includes:    Plavix 75 Mg Tabs (Clopidogrel bisulfate) ..... One half tablet by mouth once daily    Lopressor 50 Mg Tabs (Metoprolol tartrate) .Marland Kitchen... 1/2 tab bid    Nitroglycerin 0.4 Mg Subl (Nitroglycerin) .Marland Kitchen... As needed  Problem # 2:  HYPERTENSION, ESSENTIAL NOS (ICD-401.9) Consider adding amlodipine in future.  Multiple allergies to BP meds.  Home readings are fine His updated medication list for this problem includes:    Lopressor 50 Mg Tabs (Metoprolol tartrate) .Marland Kitchen... 1/2 tab bid  Problem # 3:  HYPERLIPIDEMIA (ICD-272.2) F/U labs primary.  If LDL greater than 130 consider another statin trial  Patient Instructions: 1)  Your physician wants you to follow-up in: ONE YEAR You will receive a reminder letter in the mail two months in advance. If you don't receive a letter, please call our office to schedule the follow-up appointment.

## 2010-05-13 LAB — BASIC METABOLIC PANEL
CO2: 26 mEq/L (ref 19–32)
CO2: 29 mEq/L (ref 19–32)
Calcium: 8.2 mg/dL — ABNORMAL LOW (ref 8.4–10.5)
Chloride: 109 mEq/L (ref 96–112)
GFR calc Af Amer: >60 mL/min (ref >60)
GFR calc non Af Amer: >60 mL/min (ref >60)
Glucose, Bld: 101 mg/dL — ABNORMAL HIGH (ref 70–99)
Potassium: 3.4 mEq/L — ABNORMAL LOW (ref 3.5–5.1)
Sodium: 138 mEq/L (ref 135–145)
Sodium: 140 mEq/L (ref 135–145)

## 2010-05-13 LAB — TYPE AND SCREEN: Antibody Screen: NEGATIVE

## 2010-05-13 LAB — CBC
HCT: 27.5 % — ABNORMAL LOW (ref 39.0–52.0)
Hemoglobin: 8.9 g/dL — ABNORMAL LOW (ref 13.0–17.0)
Hemoglobin: 9.2 g/dL — ABNORMAL LOW (ref 13.0–17.0)
MCHC: 33.3 g/dL (ref 30.0–36.0)
MCHC: 33.6 g/dL (ref 30.0–36.0)
MCV: 87.8 fL (ref 78.0–100.0)
RBC: 3.05 MIL/uL — ABNORMAL LOW (ref 4.22–5.81)
RDW: 14.6 % (ref 11.5–15.5)
WBC: 7 10*3/uL (ref 4.0–10.5)

## 2010-05-14 LAB — BASIC METABOLIC PANEL
CO2: 30 mEq/L (ref 19–32)
Calcium: 9.2 mg/dL (ref 8.4–10.5)
Chloride: 105 mEq/L (ref 96–112)
Creatinine, Ser: 1.04 mg/dL (ref 0.4–1.5)
Glucose, Bld: 111 mg/dL — ABNORMAL HIGH (ref 70–99)

## 2010-05-14 LAB — DIFFERENTIAL
Basophils Absolute: 0 10*3/uL (ref 0.0–0.1)
Basophils Relative: 0 % (ref 0–1)
Eosinophils Absolute: 0.1 10*3/uL (ref 0.0–0.7)
Monocytes Absolute: 0.6 10*3/uL (ref 0.1–1.0)
Neutro Abs: 5.4 10*3/uL (ref 1.7–7.7)
Neutrophils Relative %: 72 % (ref 43–77)

## 2010-05-14 LAB — CBC
Hemoglobin: 13.6 g/dL (ref 13.0–17.0)
MCHC: 33.2 g/dL (ref 30.0–36.0)
MCV: 88.2 fL (ref 78.0–100.0)
RDW: 14.2 % (ref 11.5–15.5)

## 2010-05-14 LAB — APTT: aPTT: 30 seconds (ref 24–37)

## 2010-05-14 LAB — URINALYSIS, ROUTINE W REFLEX MICROSCOPIC
Bilirubin Urine: NEGATIVE
Hgb urine dipstick: NEGATIVE
Protein, ur: NEGATIVE mg/dL
Urobilinogen, UA: 1 mg/dL (ref 0.0–1.0)

## 2010-06-21 NOTE — Assessment & Plan Note (Signed)
[facility]                            CARDIOLOGY OFFICE NOTE   LOTT, SEELBACH                        MRN:          [id number]  DATE:09/24/2007                            DOB:          1950/07/25    Eric Dickerson returns today for followup.  He has had significant angina  with angioplasty and stenting of the right coronary artery.   A note should be made, he does have an intolerance to [facility] and he  is premedicated with prednisone and Benadryl.   His procedure was done by Dr. Excell Seltzer on August 28, 2007.  Since that time  he has been angina free.  He has been active.  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   His coronary risk factors include hypertension, hyperlipidemia, and a  family history of coronary disease.   He has been compliant with his meds.  He has nitroglycerin with him  today.   His review of systems, otherwise negative.  He is taking aspirin a day,  Plavix 75 a day, metoprolol 25 b.i.d., and Crestor 40 a day.   PHYSICAL EXAMINATION:  GENERAL:  Remarkable for a thin-black male, in no  distress.  He has poor dentition.  VITAL SIGNS:  His weight is 194, blood pressure is 137/88, pulse 80 and  regular, and respiratory rate 14 and afebrile.  HEENT:  Unremarkable.  Carotids are normal without bruit.  No  lymphadenopathy, thyromegaly, JVP, or elevation.  LUNGS:  Clear diaphragmatic motion.  No wheezing.  S1 and S2 with normal  heart sounds.  PMI normal.  ABDOMEN:  Benign.  Bowel sounds positive.  No AAA.  No tenderness.  No  bruit.  No hepatosplenomegaly or hepatojugular reflux.  No tenderness.  Cath site, well-healed with no bruit and no ecchymosis.  Distal pulse  are intact.  No edema.  NEUROLOGIC:  Nonfocal.  SKIN:  Warm and dry.  No muscular weakness.   IMPRESSION:  1. Coronary artery disease, stent to the right coronary artery.      Continue aspirin and Plavix.  Follow up treadmill in 3 months.  His      EKG today  was normal and I do not think he needs an imaging study.  2. Hypercholesterolemia in the setting of coronary disease.  Continue      Crestor, lipid, and liver profile in 3 months when he comes for his      treadmill.  3. Hypertension, currently well controlled.  Continue current dose of      metoprolol, we may need to increase this in the future as his basal      heart rate is still in the 80s.  4. History of reflux.  Continue p.r.n. antacids.  He was previously on      an H2 blocker, but we stopped this now that he is on Plavix due to      the potential for resistance to antiplatelet therapy.   I will see him back in 3 months for his treadmill and check his lipid  profile and liver.  Noralyn Pick. Eden Emms, MD, [facility]  Electronically Signed    PCN/MedQ  DD: 09/25/2007  DT: 09/25/2007  Job #: [id number]

## 2010-06-21 NOTE — Discharge Summary (Signed)
NAME:  Eric Dickerson, Eric Dickerson                 ACCOUNT NO.:  [id number]   MEDICAL RECORD NO.:  [id number]          PATIENT TYPE:  INP   LOCATION:  6525                         FACILITY:  [facility]   PHYSICIAN:  Noralyn Pick. Eden Emms, MD, FACCDATE OF BIRTH:  [birth year]   DATE OF ADMISSION:  08/28/2007  DATE OF DISCHARGE:  08/29/2007                               DISCHARGE SUMMARY   CARDIOLOGIST:  Theron Arista C. Eden Emms, MD, [facility]   PRIMARY CARE PHYSICIAN:  Titus Dubin. Alwyn Ren, MD, FACP, FCCP   REASON FOR ADMISSION:  Chest pain and abnormal nuclear study.   DISCHARGE DIAGNOSES:  1. Coronary artery disease.      a.     Status post percutaneous coronary intervention to the right       coronary artery using 2 overlapping Promus drug-eluting stents.      b.     Residual severe stenosis of the ramus intermediate (90%       lesion).      c.     Residual nonobstructive coronary artery disease in the       circumflex and left anterior descending.  2. Preserved left ventricular function with an ejection fraction of      65%.  3. Hypertension.  4. Hyperlipidemia.  5. Gastroesophageal reflux disease.  6. Questionable SHELLFISH allergy.   PROCEDURE PERFORMED IN THIS ADMISSION:  Cardiac catheterization and  percutaneous coronary intervention by Dr. Tonny Bollman on August 28, 2007.  Please see the dictated note for complete details.   ADMISSION HISTORY:  [name] is a 60 year old male patient who saw Dr.  Eden Emms initially in consultation as an outpatient on August 22, 2007 with  complaints of chest pain and abnormal EKG.  He was set up for a Myoview  scan.  This was done in the office on August 28, 2007.  The patient had  chest pain in stage I and his images revealed anterior and inferior  ischemia.  He was admitted for cardiac catheterization.   HOSPITAL COURSE:  The patient underwent cardiac catheterization, the  same day as admission.  As noted above, he had complex 100% proximal  lesion in the RCA.  This was  successfully treated with 2 Promus drug-  eluting stents that overlapped.  He was noted to have significant  disease in the ramus intermediate as well.  Plans are for staged PCI of  this vessel in the coming weeks.  The patient was placed on metoprolol  and Crestor in addition to his aspirin and Plavix.  He was previously on  hydrochlorothiazide and this medication is discontinued at this time  given the initiation of metoprolol.   PERTINENT LABORATORY DATA:  Hemoglobin 11.8 on August 29, 2007, MCV 87.1,  potassium 3.5, BUN 14, creatinine 1.05, TSH 1.010, AST 21, ALT 18, and  albumin 3.6.  Postprocedure cardiac enzymes, CK-MB remained negative x2.  Troponin did have a minimal rise of 0.07, then 0.10.  Chest x-ray on  admission revealed no active lung disease.   DISCHARGE MEDICATIONS:  1. Coated aspirin 325 mg daily.  2. Plavix 75 mg daily.  3. Metoprolol tartrate 25 mg b.i.d.  4. Crestor 40 mg daily.  5. Nitroglycerin p.r.n. chest pain.   The patient was advised to stop his hydrochlorothiazide.   DIET:  Low fat, low sodium diet.   WOUND CARE:  The patient is to call our office for any groin swelling,  bleeding, bruising, or fever.   ACTIVITY:  He is to increase his activity slowly.  No lifting, driving,  or sexual activity for the next 3 days.  Dr. Eden Emms has cleared him to  go back to work on September 02, 2007.   Followup will be with Dr. Tonny Bollman on September 04, 2007, at 2:45 p.m.  The patient will be set up for staged intervention of his ramus  intermediate at his followup appointment that should occur in the next 2  weeks.  The patient should follow up with Dr. Alwyn Ren as directed as well  as Dr. Jarold Motto, his gastroenterologist.   Total physician and PA time greater than 30 minutes and was discharged.      Tereso Newcomer, PA-C      Peter C. Eden Emms, MD, [facility]  Electronically Signed    SW/MEDQ  D:  08/29/2007  T:  08/30/2007  Job:  [id number]   cc:   Titus Dubin. Alwyn Ren,  MD,FACP,FCCP

## 2010-06-21 NOTE — Assessment & Plan Note (Signed)
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  He apparently had a chest x-ray which showed a large  hiatal hernia.  He has a known long history of acid reflux and has been  seen previously by Dr. Corinda Gubler and myself.  He had endoscopic exam in  2002 which showed gastritis felt secondary to NSAIDs.  At that time, he  had had guaiac-positive stools.  Colonoscopy was fairly unremarkable,  except for diverticulosis and some external hemorrhoids.   Eric Dickerson is having regular bowel movements at this time and denies  melena or hematochezia.  Of concern is the fact that he has lost 20  pounds of weight over the last year with some mild anorexia.  He really  denies chest pain or other gastrointestinal symptoms.  He recently saw  Dr. Alwyn Ren and had a negative general physical exam and laboratory data,  including CBC and metabolic profile.  Hemoglobin was 13.8.  TSH level  also was normal as were liver functions.  The patient does not abuse  alcohol, cigarettes, or NSAIDs.  He has not, from what I can see on  reviewing his chart, had evidence of previous abdominal surgery.   PAST MEDICAL HISTORY:  1. Essential hypertension.  2. Hypercholesterolemia.  3. Arthritis in his right hip, and actually on close questioning does      use a fair amount of Advil.   MEDICATIONS:  1. HCTZ 12.5 mg daily.  2. Nexium 40 mg twice a day.  3. Ibuprofen p.r.n.  4. Aleve p.r.n.   ALLERGIES:  HE DENIES DRUG ALLERGIES.   FAMILY HISTORY:  Negative in terms of gastrointestinal problems.   SOCIAL HISTORY:  He is married and apparently lives alone.  He does not  smoke or abuse ethanol.  He has a 12th grade education and works as a  Chartered certified accountant.   REVIEW OF SYSTEMS:  Noncontributory, without current cardiovascular or  pulmonary complaints.   PHYSICAL EXAMINATION:  VITAL SIGNS:  He is 6 feet 2 inches and weighs  197 pounds.  Blood pressure 122/90, pulse 84 and regular.  I could not  appreciate stigmata of chronic liver disease or thyromegaly.  CHEST:  Entirely clear.  CARDIAC:  Regular rhythm without murmurs, gallops, or rubs.  ABDOMEN:  There was no hepatosplenomegaly, abdominal masses or  tenderness.  Bowel sounds were normal.  EXTREMITIES:  Peripheral extremities were unremarkable.  MENTAL STATUS:  Clear.  RECTAL:  Deferred.   ASSESSMENT:  1. Probable large hiatal hernia with possible intermittent gastric      torsion.  2. History of chronic gastroesophageal reflux disease currently on      twice a day Nexium  therapy.  3. History of nonsteroidal anti-inflammatory drug gastroduodenitis -      rule out nonsteroidal anti-inflammatory drug-induced gastric      ulceration.  4. Anorexia and weight loss of unexplained etiology.   RECOMMENDATIONS:  1. Continue reflux regime and twice a day PPI therapy.  2. Will check outpatient barium swallow/upper GI series with      appropriate GI followup.  3. Continue other medications per Dr. Alwyn Ren.     Vania Rea. Jarold Motto, MD, [facility], FAGA  Electronically Signed    DRP/MedQ  DD: 05/31/2007  DT: 05/31/2007  Job #: [id number]   cc:   Titus Dubin. Alwyn Ren, MD,FACP,FCCP

## 2010-06-21 NOTE — Assessment & Plan Note (Signed)
[facility]                            CARDIOLOGY OFFICE NOTE   Eric Dickerson, Eric Dickerson                        MRN:          [id number]  DATE:09/04/2007                            DOB:          March 07, 1950    Eric Dickerson was seen in followup at the [facility] Office on  September 04, 2007.  He is a 60 year old gentleman with coronary artery  disease.  Eric Dickerson presented earlier in the month with chest pain.  He  had both typical and atypical symptoms; however, he underwent an  exercise stress Myoview study with very high risk features.  He  developed chest pain during stage I of the Bruce protocol with inferior  and anterolateral ischemic defects on perfusion imaging.  He underwent  diagnostic coronary angiography later that day on August 28, 2007.  This  demonstrated total occlusion of the right coronary artery with left to  right collaterals, as well as severe stenosis of a moderate-sized ramus  intermedius branch.  The LAD and left circumflex had nonobstructive  stenosis.  Because of the large distal right coronary artery and  significant ischemic defect on stress perfusion imaging, we elected to  proceed with an attempt of reopening the vessel.  There were favorable  angiographic features of this occlusion.  Dr. Myrene Galas and I  performed the procedure together and the vessel was successfully  reopened and treated with overlapping Promus drug-eluting stents.  The  patient had an uneventful postprocedure course and went home the  following day.  Since return home, he continues to have postprandial  chest discomfort.  He denies any further exertional symptoms, but he has  really not been engaged in any heavy exertion since his discharge from  the hospital.  He has been taking Zantac, which has helped his  epigastric and chest discomfort.  He has no dyspnea or other complaints.   Current medications include aspirin 325 mg daily, Plavix 75 mg daily,  metoprolol 25 mg twice daily, and Crestor 40 mg daily.   Allergies include SHELLFISH and GLUCOSAMINE.   PHYSICAL EXAM:  GENERAL:  The patient is alert and oriented.  He is in  no acute distress.  VITALS:  Blood pressure is 133/84, heart rate 83, weight is 195 pounds,  respiratory rate is 12.  HEENT:  Normal.  NECK:  Normal.  Carotid upstrokes without bruits.  JVP is normal.  LUNGS:  Clear bilaterally.  HEART:  Regular rate and rhythm without murmurs or gallops.  ABDOMEN:  Soft, nontender.  No organomegaly.  EXTREMITIES:  No clubbing, cyanosis, or edema.  Distal pulses are intact  and equal.   ASSESSMENT:  This is a 60 year old gentleman with coronary artery  disease as outlined above.  He has had successful stenting of a  chronically occluded right coronary artery.  He has a residual severe  stenosis of the ramus intermedius branch.  The etiologies of his  symptoms are difficult to sort out,  but he clearly have risk stress  study that showed ischemia on both the inferior wall and lateral wall.  We discussed  staged percutaneous coronary intervention of the ramus  branch.  I think this is appropriate in the setting of lateral wall  ischemia.  His procedure was scheduled for next week and risks and  indications were reviewed in detail.  The patient agrees to proceed.  He  will continue on dual anti-platelet therapy with aspirin and Plavix.  After his percutaneous coronary intervention, he will follow up with Dr.  Eden Emms, who is his primary cardiologist.  The patient developed a mild  rash after his previous catheterization, and he has a known shellfish  allergy.  He will be treated with prednisone and Benadryl prior to the  procedure.     Veverly Fells. Excell Seltzer, MD  Electronically Signed    MDC/MedQ  DD: 09/04/2007  DT: 09/05/2007  Job #: [id number]   cc:   Vania Rea. Jarold Motto, MD, [facility], FACP, FAGA  Noralyn Pick. Eden Emms, MD, [facility]

## 2010-06-21 NOTE — Assessment & Plan Note (Signed)
[facility]                            CARDIOLOGY OFFICE NOTE   THEODOROS, STJAMES                        MRN:          [id number]  DATE:08/22/2007                            DOB:          September 28, 1950    HISTORY OF PRESENT ILLNESS:  A 60 year old patient referred for abnormal  EKG, chest pain, and hypertension.   Eric Dickerson is a pleasant 60 year old gentleman, no previously documented  history of heart problems.   He has been having chest pains over the last couple of months,  particularly last 2 weeks.  He has had previous chest pains associated  with reflux.  He has been treated over the years for significant reflux.  However, this was clearly different.  His reflux pain did start in the  epigastrium and went up to his chest, but was clearly worse at night in  recumbency.  He has had a recent EGD, which did not show any  recurrences.  His current pain is exertional.  He can get it after  walking, again it is epigastric pain that radiates up.  There is no  associated diaphoresis or belching.  There is no palpitations or  syncope.   Interestingly, after an initial episode, the pain resolves with resting.  He can walk further and he does not get pain.   He has not had a previous stress test.  His coronary risk factors  include primarily hypertension.   PAST MEDICAL HISTORY:  Remarkable for reflux, hypercholesterolemia, and  hypertension.   FAMILY HISTORY:  Remarkable for an MI in his father's side at age 65.   PAST SURGICAL HISTORY:  The patient has not had previous surgery.   He has not had any significant rest pain.   ALLERGIES:  He has multiple allergies.  He is allergic to ASPIRIN, which  sounds like more GI irritability.  He is allergic to GLUCOSAMINE.  There  is a question of a SHELLFISH allergy.  He also indicates intolerances to  VERAPAMIL, TOPROL, BENICAR, ACCUPRIL, and LABETALOL.   SOCIAL HISTORY:  The patient is happily married  for last 34 years.  He  has 4 children.  He is a Probation officer.  He is otherwise sedentary.  He  likes to fish.  He does not smoke or use alcohol.   CURRENT MEDICATIONS:  1. Hydrochlorothiazide 12.5 a day.  2. Nexium 40 b.i.d.   PHYSICAL EXAMINATION:  GENERAL:  Remarkable for a middle-aged black  male, in no distress.  Affect is appropriate.  He has poor dentition.  VITAL SIGNS:  Weight is 194, blood pressure is 140/88, pulse 93, and he  had a respiratory rate 14, afebrile.  HEENT:  Unremarkable.  NECK:  Carotids normal without bruit, no lymphadenopathy, no  thyromegaly, no JVP elevation.  LUNGS:  Clear.  Good diaphragmatic motion.  No wheezing.  S1 and S2.  Normal heart sounds.  PMI normal.  ABDOMEN:  Benign.  Bowel sounds positive.  No AAA, no tenderness, no  bruit, no hepatosplenomegaly, or hepatojugular reflux.  No epigastric  pain.  EXTREMITIES:  Distal pulses are intact,  no edema.  NEURO:  Nonfocal.  SKIN:  Warm and dry.  MUSCULOSKELETAL:  No muscular weakness.   EKG shows sinus rhythm, nonspecific ST-T wave changes with insignificant  Q waves in II, III and F.   IMPRESSION:  1. Chest pain, a little bit worrisome for angina and that is      exertional.  He needs to follow up stress Myoviews.  Prescription      for sublingual nitro given.  2. Borderline hypertension.  Continue low-dose diuretic.  Follow up      with Dr. Alwyn Ren, given his multiple previous intolerances to meds.      I will leave it up to Dr. Alwyn Ren to further assess his blood      pressure.  His systolic pressures with exercise on his treadmill      will give Dr. Alwyn Ren some leeway in regards to whether it is      adequately treated.  3. History of gastritis.  Continue his current dose of H2 blocker.      Follow up with Dr. Jarold Motto.  4. Abnormal EKG.  I do not think he has had a previous inferior wall      MI.  His Q seem insignificant.  His stress test will give [place] more      leeway into any  abnormalities.  They may simply reflect his      borderline hypertension.   Further recommendation will be based on the results of his response to  nitro and stress test.     Theron Arista C. Eden Emms, MD, [facility]  Electronically Signed    PCN/MedQ  DD: 08/22/2007  DT: 08/22/2007  Job #: [id number]   cc:   Vania Rea. Jarold Motto, MD, [facility], FAGA

## 2010-06-21 NOTE — H&P (Signed)
NAME:  Eric Dickerson, Eric Dickerson NO.:  [id number]   MEDICAL RECORD NO.:  [id number]          PATIENT TYPE:  INP   LOCATION:  NA                           FACILITY:  [facility]   PHYSICIAN:  Madlyn Frankel. Charlann Boxer, M.D.  DATE OF BIRTH:  [birth year]   DATE OF ADMISSION:  10/13/2008  DATE OF DISCHARGE:                              HISTORY & PHYSICAL   PROCEDURE:  Right total hip replacement.   CHIEF COMPLAINTS:  Right hip pain.   HISTORY OF PRESENT ILLNESS:  A 60 year old male with a history of right  hip pain secondary to osteoarthritis.  X-rays revealed bone-on-bone  articulation.  It has been refractory to all conservative treatment.  He  walks with a significant antalgic and Trendelenburg gait.   PRIMARY CARE PHYSICIAN:  Titus Dubin. Alwyn Ren, MD,FACP,FCCP.   CARDIOLOGIST:  Noralyn Pick. Eden Emms, MD, [facility].   PAST MEDICAL HISTORY:  Significant for:  1. Osteoarthritis.  2. Coronary artery disease with stenting.  3. Heart murmur.  4. Hypertension.  5. Reflux disease.   PREVIOUS SURGERY:  Heart stenting.   FAMILY HISTORY:  Heart disease.   SOCIAL HISTORY:  Married, works as a Probation officer.  Nonsmoker.  Primary  caregiver will be family in the home postoperatively.   DRUG ALLERGIES:  No known drug allergies.   CURRENT MEDICATIONS:  1. Plavix 75 mg p.o. daily.  2. Metoprolol50 mg 1/2 tablet b.i.d.  3. Nitroglycerin p.r.n.  4. Tramadol 50 mg p.r.n.   REVIEW OF SYSTEMS:  MUSCULOSKELETAL:  He has joint swelling, joint pain,  back pain.  Otherwise see HPI.   PHYSICAL EXAMINATION:  Pulse 72, respirations 16, blood pressure 138/90.  GENERAL:  Awake, alert and oriented, well-developed, well-nourished in  no acute distress.  HEENT:  Normocephalic.  NECK:  Supple.  No carotid bruits.  CHEST:  Lung sounds clear to auscultation bilaterally.  BREASTS:  Deferred.  HEART:  S1, S2 distinct.  ABDOMEN:  Soft, nontender, nondistended.  Bowel sounds present.  PELVIS:  Stable.  GENITOURINARY:   Deferred.  EXTREMITIES:  Right hip has decreased range of motion, increased pain  with weightbearing, Trendelenburg gait.  SKIN:  No cellulitis.  NEUROLOGIC:  Intact distal sensibilities.   LABORATORY DATA:  Labs, EKG, chest x-ray all pending presurgical  testing.   IMPRESSION:  Right hip osteoarthritis.   PLAN OF ACTION:  Right total hip replacement by Madlyn Frankel. Charlann Boxer, M.D. at  [facility] on October 13, 2008.  Risks, complications were discussed.  Postoperative medications were provided in the office today including  aspirin for deep vein thrombosis  prophylaxis.   He will be speaking with his primary care physician as well as his  cardiologist about bridging therapy and coming off of Plavix prior to  surgery.     ______________________________  Eric Dickerson, [place]      Madlyn Frankel. Charlann Boxer, M.D.  Electronically Signed    BLM/MEDQ  D:  09/30/2008  T:  09/30/2008  Job:  [id number]   cc:   Titus Dubin. Alwyn Ren, MD,FACP,FCCP  [phone] [place]  [place] [address]  Noralyn Pick. Eden Emms, MD, [facility]  [address]  [address]  [place]  [place] [address]

## 2010-06-21 NOTE — H&P (Signed)
NAME:  Eric Dickerson NO.:  [id number]   MEDICAL RECORD NO.:  [id number]          PATIENT TYPE:  INP   LOCATION:  6525                         FACILITY:  [facility]   PHYSICIAN:  Madolyn Frieze. Jens Som, MD, FACCDATE OF BIRTH:  24-Jul-1950   DATE OF ADMISSION:  08/28/2007  DATE OF DISCHARGE:                              HISTORY & PHYSICAL   [name] is a very pleasant 60 year old gentleman who was recently seen  by Dr. Eden Emms for exertional chest pain.  He had developed this over the  past 1 month.  The pain is in the substernal area and radiates to the  upper chest.  It is not pleuritic in position nor is it related to food.  It occurs with exertion and typically resolves with rest.  He has had  brief episodes at rest as well.  He was scheduled for Myoview today,  which showed anterior and inferior ischemia.  He also had chest pain in  stage I.  He is now being admitted for unstable angina and cardiac  catheterization.   His present medications include hydrochlorothiazide 12.5 mg p.o. daily.   He has an allergy to [facility].  He states he has an intolerance to  ASPIRIN due to reflux, and he has an allergy to GLUCOSAMINE.   SOCIAL HISTORY:  He does not smoke nor does he consume alcohol.   His family history is positive for coronary artery disease.   His past medical history is significant for hypertension and  hyperlipidemia, but there is no diabetes mellitus.  He does have a  history of gastroesophageal reflux disease.  He has no prior surgeries.   REVIEW OF SYSTEMS:  He denies any headaches, fevers, or chills.  There  is no productive cough or hemoptysis.  There is no dysphagia,  odynophagia, melena, or hematochezia.  There is no dysuria or hematuria.  There are no rashes or seizure activity.  There is no orthopnea, PND, or  pedal edema.  He has lost approximately 14 pounds over the past year.  Remaining systems are negative.   Physical examination today shows a  blood pressure of 138/80, and his  pulse is 80.  He is well developed and well nourished, in no acute  distress.  His skin is warm and dry.  He does not appear to be  depressed.  There is no peripheral clubbing.  His back is normal.  His  HEENT is normal with normal eyelids.  His neck is supple with normal  upstroke bilaterally.  No bruits noted.  There is no jugular venous  distention.  I cannot appreciate thyromegaly.  His chest is clear to  auscultation.  Normal strengths.  Cardiovascular exam is regular rhythm.  Normal S1 and S2.  There are no murmurs, rubs, or gallops noted.  Abdominal exam, nontender, distended, and positive bowel sounds.  No  hepatosplenomegaly.  No masses appreciated.  There is no abdominal  bruit.  He has 2+ femoral pulses bilaterally.  No bruits.  Extremities  show no edema that I can palpate.  No cords.  He has 2+  posterior tibial  pulses bilaterally.  Neurologic exam is grossly intact.   His electrocardiogram from the treadmill earlier today shows a sinus  rhythm with no ST changes.   DIAGNOSES:  1. Unstable angina - Mr. Haag is complaining of exertional chest pain      and symptoms that are classic for angina.  He has also had some at      rest.  His Myoview shows chest pain in stage I as well as anterior      and inferior ischemia.  We will plan to admit him to [facility] and treat with aspirin, heparin, Lopressor, and statin.      We will proceed with cardiac catheterization tomorrow.  The risks      and benefits have been discussed, and the patient agrees to      proceed.  2. Hypertension - we will begin Lopressor and track his blood pressure      and adjust as indicated.  3. Hyperlipidemia - he will have Lipitor 80 mg p.o. daily added to his      medical regimen.  We will check lipids and liver in 6 weeks.      Madolyn Frieze Jens Som, MD, [facility]  Electronically Signed     BSC/MEDQ  D:  08/28/2007  T:  08/29/2007  Job:  [id number]

## 2010-06-21 NOTE — Discharge Summary (Signed)
NAME:  Eric Dickerson, Eric Dickerson NO.:  [id number]   MEDICAL RECORD NO.:  [id number]          PATIENT TYPE:  OIB   LOCATION:  2621                         FACILITY:  [facility]   PHYSICIAN:  Veverly Fells. Excell Seltzer, MD  DATE OF BIRTH:  Mar 02, 1950   DATE OF ADMISSION:  09/10/2007  DATE OF DISCHARGE:  09/11/2007                               DISCHARGE SUMMARY   PRIMARY FINAL DISCHARGE DIAGNOSIS:  Unstable anginal pain.   SECONDARY DIAGNOSES:  1. Status post percutaneous transluminal coronary angioplasty and      stent to the right coronary artery on August 28, 2007.  2. Allergy or intolerance to shellfish and glucosamine.  3. Hypertension.  4. Hyperlipidemia.  5. Gastroesophageal reflux disease.  6. Family history of coronary artery disease.   TIME OF DISCHARGE:  34 minutes.   HOSPITAL COURSE:  Eric Dickerson is a 60 year old male with known coronary  artery disease.  He had a stent on August 28, 2007, but continued to have  chest pain.  He was seen in the office on September 04, 2007, and scheduled  for outpatient relook catheterization.   The cardiac catheterization showed a ramus intermedius with a 90%  stenosis reduced to 0 with PTCA and a Cypher stent.  He was enrolled in  the ADAPT-DES study and will be followed by research as well.  He was  seen by cardiac rehab postprocedure and ambulated without chest pain or  shortness of breath.   On September 11, 2007, his postprocedure labs were stable.  Eric Dickerson stated  that he was having some stomach upset with full strength aspirin even  though the aspirin was coated.  He is encouraged short term to take the  full strength aspirin and add a proton pump inhibitor or over-the-  counter H2 blocker to his medication regimen.  He will be switched to 81  mg aspirin as soon as possible.  Eric Dickerson was felt by Dr. Excell Seltzer to be  stable for discharge with close outpatient followup arranged.   DISCHARGE INSTRUCTIONS:  1. His activity level is to be  increased gradually.  2. He is not to do any lifting for 2 weeks and no driving for 2 days.  3. He is to call our office for any problems with the cath site.  4. He is to stick to a low-fat diet.  5. He is to follow up with Dr. Eden Emms on September 25, 2007, at 8 a.m.  6. He is to follow up with Dr. Jarold Motto as needed.   DISCHARGE MEDICATIONS:  1. Plavix 75 mg daily.  2. Coated aspirin 325 mg daily.  3. Crestor 40 mg a day.  4. Metoprolol 25 mg b.i.d.  5. Nitroglycerin sublingual p.r.n.      Theodore Demark, PA-C      Veverly Fells. Excell Seltzer, MD  Electronically Signed    RB/MEDQ  D:  09/11/2007  T:  09/12/2007  Job:  [id number]   cc:   Vania Rea. Jarold Motto, MD, Caleen Essex, FAGA

## 2010-08-21 ENCOUNTER — Other Ambulatory Visit: Payer: Self-pay | Admitting: Cardiovascular Disease

## 2010-11-04 LAB — CBC
HCT: 39.6
Hemoglobin: 11.8 — ABNORMAL LOW
Hemoglobin: 12.8 — ABNORMAL LOW
MCHC: 32.6
MCHC: 32.7
MCHC: 32.9
MCV: 87.1
MCV: 87.1
Platelets: 240
Platelets: 265
RBC: 4.17 — ABNORMAL LOW
RBC: 4.53
RDW: 13.8
RDW: 13.9
RDW: 13.9
WBC: 7.1
WBC: 9.4
WBC: 8.7

## 2010-11-04 LAB — BASIC METABOLIC PANEL
BUN: 16
CO2: 29
CO2: 25
Calcium: 9
Calcium: 8.8
Chloride: 104
Creatinine, Ser: 1.05
Creatinine, Ser: 1.04
GFR calc Af Amer: >60
GFR calc Af Amer: >60
GFR calc non Af Amer: >60
Glucose, Bld: 95
Glucose, Bld: 90
Potassium: 4
Sodium: 138
Sodium: 140

## 2010-11-04 LAB — COMPREHENSIVE METABOLIC PANEL
ALT: 18
Alkaline Phosphatase: 73
BUN: 11
CO2: 30
Chloride: 102
GFR calc non Af Amer: >60
Glucose, Bld: 88
Potassium: 3.3 — ABNORMAL LOW
Sodium: 139
Total Bilirubin: 0.7

## 2010-11-04 LAB — CARDIAC PANEL(CRET KIN+CKTOT+MB+TROPI)
CK, MB: 2.1
CK, MB: 3.1
Relative Index: 1.6
Total CK: 138
Total CK: 163

## 2010-11-04 LAB — PROTIME-INR
INR: 0.9
Prothrombin Time: 12.5
Prothrombin Time: 13.6

## 2011-01-02 ENCOUNTER — Other Ambulatory Visit: Payer: Self-pay | Admitting: Cardiovascular Disease

## 2011-01-04 NOTE — Telephone Encounter (Signed)
FU Call: Pt calling to check on status of plavix refill. Please call this refill in.

## 2011-01-12 ENCOUNTER — Ambulatory Visit (INDEPENDENT_AMBULATORY_CARE_PROVIDER_SITE_OTHER): Payer: BC Managed Care – PPO | Admitting: Internal Medicine

## 2011-01-12 ENCOUNTER — Encounter: Payer: Self-pay | Admitting: Internal Medicine

## 2011-01-12 VITALS — BP 132/90 | HR 94 | Temp 98.5°F | Resp 12 | Ht 74.0 in | Wt 196.2 lb

## 2011-01-12 DIAGNOSIS — Z0000 Encounter for general adult medical examination without abnormal findings: Secondary | ICD-10-CM

## 2011-01-12 DIAGNOSIS — M48061 Spinal stenosis, lumbar region without neurogenic claudication: Secondary | ICD-10-CM

## 2011-01-12 DIAGNOSIS — E782 Mixed hyperlipidemia: Secondary | ICD-10-CM

## 2011-01-12 DIAGNOSIS — I2510 Atherosclerotic heart disease of native coronary artery without angina pectoris: Secondary | ICD-10-CM

## 2011-01-12 NOTE — Progress Notes (Signed)
Subjective:    Patient ID: Eric Dickerson, male    DOB: [birth year], 60 y.o.   MRN: [id number]  HPI  Eric Dickerson  is here for a physical;acute issues intermittent postural lightheadedness which he attributes to hypertension.      Review of Systems HYPERTENSION: Disease Monitoring: Blood pressure range-variable, 132/86-141/102   Chest pain, palpitations-no       Dyspnea- no Medications: Compliance- not since BP rose as noted; he had been taking 1/2 daily  Syncope- no    Edema- no  FASTING HYPERGLYCEMIA, PMH of: Disease Monitoring: Blood Sugar ranges-not monitored  Polyuria/phagia/dipsia- ferquency       Visual problems- no Diet: no Exercise: minimally   HYPERLIPIDEMIA: Disease Monitoring: See symptoms for Hypertension Medications: Compliance- previously on statin ; D/Ced by him due to cramps    ROS: Abd pain, bowel changes- no   Muscle aches- not off statin        Objective:   Physical Exam Gen.: Healthy and well-nourished in appearance. Alert, appropriate and cooperative throughout exam. Head: Normocephalic without obvious abnormalities;  no alopecia  Eyes: No corneal or conjunctival inflammation noted. Pupils equal round reactive to light and accommodation. Fundal exam is benign without hemorrhages, exudate, papilledema. Extraocular motion intact. Arcus senilis. Ears: External  ear exam reveals no significant lesions or deformities. Canals clear .TMs normal.  Nose: External nasal exam reveals no deformity or inflammation. Nasal mucosa are pink and moist. No lesions or exudates noted.   Mouth: Oral mucosa and oropharynx reveal no lesions or exudates. Dentures. Neck: No deformities, masses, or tenderness noted. Range of motion normal. Thyroid small & firm; no nodules palpable. Lungs: Normal respiratory effort; chest expands symmetrically. Lungs are clear to auscultation without rales, wheezes, or increased work of breathing. Heart: Normal rate and rhythm. Normal S1 and  S2. No gallop, click, or rub. Grade 1/6 systolic murmur. Abdomen: Bowel sounds normal; abdomen soft and nontender. No masses, organomegaly or hernias noted. Genitalia/DRE: Genital exam is unremarkable. Prostate reveals no enlargement, asymmetry, nodularity, or induration.   .                                                                                   Musculoskeletal/extremities: No deformity or scoliosis noted of  the thoracic or lumbar spine. No clubbing, cyanosis, edema, or deformity noted. Range of motion  normal .Tone & strength  normal.Joints normal. Nail health  good. Vascular: Carotid, radial artery, dorsalis pedis and  posterior tibial pulses are full and equal. No bruits present. Neurologic: Alert and oriented x3. Deep tendon reflexes symmetrical and normal.         Skin: Intact without suspicious lesions or rashes. Lymph: No cervical, axillary, or inguinal lymphadenopathy present. Psych: Mood and affect are normal. Normally interactive  Assessment & Plan:  #1 comprehensive physical exam; no acute findings #2 see Problem List with Assessments & Recommendations  #3 he has coronary artery disease but is not on a statin. He felt this caused myalgias. His family history is of concern in that 2 brothers had strokes in their 29s and his father heart attack in his 30s. Advanced cholesterol testing is indicated to optimally assess risk and optimal intervention for such.  EKG reveals no definite ischemic changes but the T. voltage is low diffusely. Plan: see Orders

## 2011-01-12 NOTE — Patient Instructions (Addendum)
Risk of premature heart attack or stroke increases as LDL or BAD cholesterol rises.Advanced cholesterol panels optimally determine risk based on particle composition ( NMR Lipoprofile ) or by assessing multiple other genetic risks(Boston Heart Panel 1304X). These are indicated when LDL is > 130, especially if there is family history of heart attack in males before 74 or women before 20  Please review Dr Gildardo Griffes book Eat, Drink & Be Healthy for dietary cholesterol information.  Blood Pressure Goal  Ideally is an AVERAGE < 135/85. This AVERAGE should be calculated from @ least 5-7 BP readings taken @ different times of day on different days of week. You should not respond to isolated BP readings , but rather the AVERAGE for that week.  Please  schedule fasting Labs : BMET, hepatic panel, CBC & dif, TSH. Also Boston Heart Panel due to statin intolerance; none coronary disease; and strong family history of premature  vascular disease. PLEASE BRING THESE INSTRUCTIONS TO FOLLOW UP  LAB APPOINTMENT.This will guarantee correct labs are drawn, eliminating need for repeat blood sampling ( needle sticks ! ). Diagnoses /Codes: V70.0, V17.3, 995.20,CAD.

## 2011-01-16 ENCOUNTER — Other Ambulatory Visit: Payer: Self-pay | Admitting: Internal Medicine

## 2011-01-16 DIAGNOSIS — T887XXA Unspecified adverse effect of drug or medicament, initial encounter: Secondary | ICD-10-CM

## 2011-01-16 DIAGNOSIS — Z0000 Encounter for general adult medical examination without abnormal findings: Secondary | ICD-10-CM

## 2011-01-16 DIAGNOSIS — Z8249 Family history of ischemic heart disease and other diseases of the circulatory system: Secondary | ICD-10-CM

## 2011-01-17 ENCOUNTER — Other Ambulatory Visit (INDEPENDENT_AMBULATORY_CARE_PROVIDER_SITE_OTHER): Payer: BC Managed Care – PPO

## 2011-01-17 DIAGNOSIS — T887XXA Unspecified adverse effect of drug or medicament, initial encounter: Secondary | ICD-10-CM

## 2011-01-17 DIAGNOSIS — Z0000 Encounter for general adult medical examination without abnormal findings: Secondary | ICD-10-CM

## 2011-01-17 DIAGNOSIS — Z8249 Family history of ischemic heart disease and other diseases of the circulatory system: Secondary | ICD-10-CM

## 2011-01-17 LAB — CBC WITH DIFFERENTIAL/PLATELET
Basophils Absolute: 0 10*3/uL (ref 0.0–0.1)
Eosinophils Relative: 2 % (ref 0.0–5.0)
Hemoglobin: 13.4 g/dL (ref 13.0–17.0)
Lymphocytes Relative: 31.1 % (ref 12.0–46.0)
Monocytes Relative: 9.3 % (ref 3.0–12.0)
Neutro Abs: 3.3 10*3/uL (ref 1.4–7.7)
RDW: 14.5 % (ref 11.5–14.6)
WBC: 5.8 10*3/uL (ref 4.5–10.5)

## 2011-01-17 LAB — HEPATIC FUNCTION PANEL
Albumin: 3.8 g/dL (ref 3.5–5.2)
Alkaline Phosphatase: 79 U/L (ref 39–117)
Bilirubin, Direct: 0.1 mg/dL (ref 0.0–0.3)
Total Bilirubin: 0.5 mg/dL (ref 0.3–1.2)
Total Protein: 7.7 g/dL (ref 6.0–8.3)

## 2011-01-17 LAB — BASIC METABOLIC PANEL
Chloride: 106 mEq/L (ref 96–112)
GFR: 98.69 mL/min (ref >60.00)
Potassium: 4.1 mEq/L (ref 3.5–5.1)

## 2011-01-17 LAB — TSH: TSH: 2.21 u[IU]/mL (ref 0.35–5.50)

## 2011-02-19 ENCOUNTER — Encounter: Payer: Self-pay | Admitting: Internal Medicine

## 2011-02-23 ENCOUNTER — Encounter: Payer: Self-pay | Admitting: Internal Medicine

## 2011-03-29 ENCOUNTER — Ambulatory Visit: Payer: BC Managed Care – PPO | Admitting: Cardiovascular Disease

## 2011-03-30 ENCOUNTER — Ambulatory Visit (INDEPENDENT_AMBULATORY_CARE_PROVIDER_SITE_OTHER): Payer: BC Managed Care – PPO | Admitting: Cardiovascular Disease

## 2011-03-30 ENCOUNTER — Encounter: Payer: Self-pay | Admitting: Cardiovascular Disease

## 2011-03-30 DIAGNOSIS — E782 Mixed hyperlipidemia: Secondary | ICD-10-CM

## 2011-03-30 DIAGNOSIS — I10 Essential (primary) hypertension: Secondary | ICD-10-CM

## 2011-03-30 DIAGNOSIS — I2510 Atherosclerotic heart disease of native coronary artery without angina pectoris: Secondary | ICD-10-CM

## 2011-03-30 MED ORDER — METOPROLOL TARTRATE 50 MG PO TABS: 50.0000 mg | ORAL_TABLET | Freq: Two times a day (BID) | ORAL | Status: DC

## 2011-03-30 NOTE — Progress Notes (Signed)
Patient ID: Eric Dickerson, male   DOB: 1950/03/06, 61 y.o.   MRN: [id number] Bertram is seen today for F/U of CAD He had stenting of the RCA in 08/2007, and staged stenting of the Ramus in 09/2007. He has arthritis with bilateral hip and back pain that is worsening. S/P right hip replaccement in 2011 with no complications He has not had any sifnificant SSCP. He has mild exertional dyspnea. He has not had any palpitaions PND, orthopnea or syncope. Last myovue 8/10 with old anterior infarct no ischemia EF 63%. BP high in office today. He takes it at home regullarly and assures me that systolics usually run in the 120's and diastolics less than 80  Had all his teeth pulled and bled from his Plavix. Stents are 62 years old. Told him it is ok to take 1/2 Plavix tablet/day Also previously intolerant to crestor and has not wanted to be started on another statin.  ROS: Denies fever, malais, weight loss, blurry vision, decreased visual acuity, cough, sputum, SOB, hemoptysis, pleuritic pain, palpitaitons, heartburn, abdominal pain, melena, lower extremity edema, claudication, or rash.  All other systems reviewed and negative  General: Affect appropriate Healthy:  appears stated age HEENT: normal Neck supple with no adenopathy JVP normal no bruits no thyromegaly Lungs clear with no wheezing and good diaphragmatic motion Heart:  S1/S2 no murmur, no rub, gallop or click PMI normal Abdomen: benighn, BS positve, no tenderness, no AAA no bruit.  No HSM or HJR Distal pulses intact with no bruits No edema Neuro non-focal Skin warm and dry No muscular weakness   Current Outpatient Prescriptions  Medication Sig Dispense Refill  . metoprolol (LOPRESSOR) 50 MG tablet TAKE ONE-HALF TABLET BY MOUTH TWICE DAILY  30 tablet  6  . PLAVIX 75 MG tablet TAKE ONE TABLET BY MOUTH EVERY DAY  30 each  12    Allergies  Olmesartan medoxomil; Quinapril hcl; Glucosamine; Rosuvastatin; and Verapamil  Electrocardiogram:  01/12/11   NSR normal ECG  Assessment and Plan

## 2011-03-30 NOTE — Assessment & Plan Note (Signed)
Increase lopresser to 50 bid.  Low sodium diet.  Intolerant to ACE inhibitors

## 2011-03-30 NOTE — Assessment & Plan Note (Signed)
Stable with no angina and good activity level.  Continue medical Rx Continue asa and plavix

## 2011-03-30 NOTE — Patient Instructions (Addendum)
Your physician wants you to follow-up in: 3 MONTHS WITH DR Haywood Filler will receive a reminder letter in the mail two months in advance. If you don't receive a letter, please call our office to schedule the follow-up appointment. Your physician has recommended you make the following change in your medication: START TAKING METOPROLOL 50 MG 1 TAB TWICE DAILY NOT 1/2 TAB

## 2011-03-30 NOTE — Assessment & Plan Note (Signed)
Cholesterol is at goal.  Continue current dose of statin and diet Rx.  No myalgias or side effects.  F/U  LFT's in 6 months. No results found for this basename: LDLCALC   F/U Dr Alwyn Ren.  Intolerant to some statins

## 2011-06-21 ENCOUNTER — Encounter: Payer: Self-pay | Admitting: *Deleted

## 2011-06-22 ENCOUNTER — Encounter: Payer: Self-pay | Admitting: Cardiovascular Disease

## 2011-06-22 ENCOUNTER — Ambulatory Visit (INDEPENDENT_AMBULATORY_CARE_PROVIDER_SITE_OTHER): Payer: BC Managed Care – PPO | Admitting: Cardiovascular Disease

## 2011-06-22 VITALS — BP 158/100 | HR 72 | Ht 74.0 in | Wt 197.1 lb

## 2011-06-22 DIAGNOSIS — Z79899 Other long term (current) drug therapy: Secondary | ICD-10-CM

## 2011-06-22 DIAGNOSIS — M48061 Spinal stenosis, lumbar region without neurogenic claudication: Secondary | ICD-10-CM

## 2011-06-22 DIAGNOSIS — I10 Essential (primary) hypertension: Secondary | ICD-10-CM

## 2011-06-22 DIAGNOSIS — M79609 Pain in unspecified limb: Secondary | ICD-10-CM

## 2011-06-22 DIAGNOSIS — E782 Mixed hyperlipidemia: Secondary | ICD-10-CM

## 2011-06-22 DIAGNOSIS — M79606 Pain in leg, unspecified: Secondary | ICD-10-CM

## 2011-06-22 MED ORDER — AMLODIPINE BESYLATE 10 MG PO TABS: 10.0000 mg | ORAL_TABLET | Freq: Every day | ORAL | Status: DC

## 2011-06-22 NOTE — Progress Notes (Signed)
Patient ID: Eric Dickerson, male   DOB: 1950-10-27, 61 y.o.   MRN: [id number] Eric Dickerson is seen today for F/U of CAD He had stenting of the RCA in 08/2007, and staged stenting of the Ramus in 09/2007. He has arthritis with bilateral hip and back pain that is worsening. S/P right hip replaccement in 2011 with no complications He has not had any sifnificant SSCP. He has mild exertional dyspnea. He has not had any palpitaions PND, orthopnea or syncope. Last myovue 8/10 with old anterior infarct no ischemia EF 63%. BP high in office today. He takes it at home regullarly and assures me that systolics usually run in the 120's and diastolics less than 80  Had all his teeth pulled and bled from his Plavix. Stents are 62 years old.  He prefers to stay on Plavix  Has weakness in legs.  Both sides heavy with exertion and sometimes can walk through it. No calf or buttock pain.  BP was high 2/13  Lopresser doubled.  ROS: Denies fever, malais, weight loss, blurry vision, decreased visual acuity, cough, sputum, SOB, hemoptysis, pleuritic pain, palpitaitons, heartburn, abdominal pain, melena, lower extremity edema, claudication, or rash.  All other systems reviewed and negative  General: Affect appropriate Healthy:  appears stated age HEENT: normal Neck supple with no adenopathy JVP normal no bruits no thyromegaly Lungs clear with no wheezing and good diaphragmatic motion Heart:  S1/S2 no murmur, no rub, gallop or click PMI normal Abdomen: benighn, BS positve, no tenderness, no AAA no bruit.  No HSM or HJR  Femorals hard to feel no obvoius bruit  PT/DP plus 2 Distal pulses intact with no bruits No edema Neuro non-focal Skin warm and dry No muscular weakness   Current Outpatient Prescriptions  Medication Sig Dispense Refill  . acetaminophen (TYLENOL) 325 MG tablet Take 325 mg by mouth as needed.      . metoprolol (LOPRESSOR) 50 MG tablet Take 1 tablet (50 mg total) by mouth 2 (two) times daily.  60 tablet  11  .  Multiple Vitamins-Minerals (CENTRUM SILVER PO) Take by mouth daily.      Marland Kitchen PLAVIX 75 MG tablet TAKE ONE TABLET BY MOUTH EVERY DAY  30 each  12    Allergies  Olmesartan medoxomil; Quinapril hcl; Glucosamine; Rosuvastatin; and Verapamil  Electrocardiogram:  NSR rate 84 normal Done 01/12/11  Assessment and Plan

## 2011-06-22 NOTE — Assessment & Plan Note (Signed)
He has weakness in his legs No femoral bruits but hard to feel pulses in groin.  Diagnosed with spinal stenosis but need to R/O PVD LE arterial duplex and ABI's consider with exercise if normal at rest

## 2011-06-22 NOTE — Progress Notes (Signed)
Addended by: Scherrie Bateman E on: 06/22/2011 03:12 PM   Modules accepted: Orders

## 2011-06-22 NOTE — Assessment & Plan Note (Signed)
Reviewed home readings and today.  Running too high Add Cozaar 50mg   Check BMET 4 weeks after starting Cozaar.

## 2011-06-22 NOTE — Assessment & Plan Note (Signed)
Cholesterol is at goal.  Continue current dose of statin and diet Rx.  No myalgias or side effects.  F/U  LFT's in 6 months. No results found for this basename: LDLCALC             

## 2011-06-22 NOTE — Patient Instructions (Signed)
Your physician recommends that you schedule a follow-up appointment in:  3 MONTHS WIT DR [facility] Your physician has recommended you make the following change in your medication: ADD  AMLODPINE 10 MG  EVERY DAY Your physician recommends that you return for lab work in:  1 MONTH  BMET  DX [phone] Your physician has requested that you have a lower or upper extremity arterial duplex. This test is an ultrasound of the arteries in the legs or arms. It looks at arterial blood flow in the legs and arms. Allow one hour for Lower and Upper Arterial scans. There are no restrictions or special instructions LOWER ART WITH ABI'S IF NORMAL MAY EXERCISE DX LEG PAIN

## 2011-06-26 ENCOUNTER — Encounter (INDEPENDENT_AMBULATORY_CARE_PROVIDER_SITE_OTHER): Payer: BC Managed Care – PPO

## 2011-06-26 DIAGNOSIS — I70219 Atherosclerosis of native arteries of extremities with intermittent claudication, unspecified extremity: Secondary | ICD-10-CM

## 2011-06-26 DIAGNOSIS — I739 Peripheral vascular disease, unspecified: Secondary | ICD-10-CM

## 2011-06-26 DIAGNOSIS — M79606 Pain in leg, unspecified: Secondary | ICD-10-CM

## 2011-06-28 ENCOUNTER — Ambulatory Visit (INDEPENDENT_AMBULATORY_CARE_PROVIDER_SITE_OTHER): Payer: BC Managed Care – PPO | Admitting: Cardiovascular Disease

## 2011-06-28 ENCOUNTER — Encounter: Payer: Self-pay | Admitting: Cardiovascular Disease

## 2011-06-28 VITALS — BP 135/90 | HR 76 | Ht 74.0 in | Wt 195.1 lb

## 2011-06-28 DIAGNOSIS — I779 Disorder of arteries and arterioles, unspecified: Secondary | ICD-10-CM

## 2011-06-28 DIAGNOSIS — I739 Peripheral vascular disease, unspecified: Secondary | ICD-10-CM | POA: Insufficient documentation

## 2011-06-28 DIAGNOSIS — I2510 Atherosclerotic heart disease of native coronary artery without angina pectoris: Secondary | ICD-10-CM

## 2011-06-28 NOTE — Patient Instructions (Signed)
Your physician has requested that you have an ankle brachial index (ABI) in 6 MONTHS. During this test an ultrasound and blood pressure cuff are used to evaluate the arteries that supply the arms and legs with blood. Allow thirty minutes for this exam. There are no restrictions or special instructions.  Your physician wants you to follow-up in: 6 MONTHS with Dr Kirke Corin. You will receive a reminder letter in the mail two months in advance. If you don't receive a letter, please call our office to schedule the follow-up appointment.  Your physician recommends that you continue on your current medications as directed. Please refer to the Current Medication list given to you today.

## 2011-06-28 NOTE — Progress Notes (Signed)
HPI  This is a 61 year old gentleman who is referred by Dr. Eden Emms for evaluation and management of newly diagnosed peripheral arterial disease. The patient has known history of coronary artery disease status post two-vessel angioplasty and stent placement. He is a previous smoker. He recently complained of bilateral weakness in both legs went walking with mild heaviness. He had an ABI and lower extremity arterial Doppler performed. The ABI was mildly reduced on the left side at 0.7 and normal on the right side. There was evidence of focal more than 50% stenosis in the mid left SFA.  The patient describes heavy feeling in both calves more on the left side after walking more than 200 yards. However, this does not happen all the time. He was actually able to walk half a mile yesterday without significant discomfort.  Allergies  Allergen Reactions  . Olmesartan Medoxomil     REACTION: rash  . Quinapril Hcl     REACTION: cough; ACE inhibitors are contraindicated because of a history of rash with dimensions of receptor blocker.  . Glucosamine     REACTION: Reaction not known  . Rosuvastatin     REACTION: muscle cramps  . Verapamil      Current Outpatient Prescriptions on File Prior to Visit  Medication Sig Dispense Refill  . acetaminophen (TYLENOL) 325 MG tablet Take 325 mg by mouth as needed.      Marland Kitchen amLODipine (NORVASC) 10 MG tablet Take 1 tablet (10 mg total) by mouth daily.  30 tablet  11  . Multiple Vitamins-Minerals (CENTRUM SILVER PO) Take by mouth daily.      Marland Kitchen PLAVIX 75 MG tablet TAKE ONE TABLET BY MOUTH EVERY DAY  30 each  12     Past Medical History  Diagnosis Date  . CAD (coronary artery disease)   . Other abnormal glucose   . Hyperlipidemia   . HTN (hypertension)   . ERECTILE DYSFUNCTION   . CORONARY ATHEROSCLEROSIS NATIVE CORONARY ARTERY   . GERD   . HIATAL HERNIA   . DIVERTICULOSIS, COLON   . HYPERPLASIA PROSTATE UNS W/O UR OBST & OTH LUTS   . DEGENERATIVE JOINT  DISEASE, RIGHT HIP   . SPINAL STENOSIS, LUMBAR   . LUMBAR RADICULOPATHY, RIGHT   . ANEMIA, MILD      Past Surgical History  Procedure Date  . Coronary stent placement 2010     X 2  . Total hip arthroplasty 2009  . Colonoscopy 2002    negative     Family History  Problem Relation Age of Onset  . Diabetes Maternal Grandmother   . Hypertension Father   . Stroke Brother     2 brothers ; 62 & 40  . Pulmonary embolism Brother   . Heart attack Father 78    smoker     History   Social History  . Marital Status: Married    Spouse Name: N/A    Number of Children: N/A  . Years of Education: N/A   Occupational History  . Not on file.   Social History Main Topics  . Smoking status: Former Smoker    Quit date: 02/06/1985  . Smokeless tobacco: Not on file  . Alcohol Use: No  . Drug Use:   . Sexually Active:    Other Topics Concern  . Not on file   Social History Narrative  . No narrative on file      PHYSICAL EXAM   BP 135/90  Pulse 76  Ht 6\' 2"  (1.88 m)  Wt 195 lb 1.9 oz (88.506 kg)  BMI 25.05 kg/m2 Constitutional: He is oriented to person, place, and time. He appears well-developed and well-nourished. No distress.  HENT: No nasal discharge.  Head: Normocephalic and atraumatic.  Eyes: Pupils are equal and round. Right eye exhibits no discharge. Left eye exhibits no discharge.  Neck: Normal range of motion. Neck supple. No JVD present. No thyromegaly present.  Cardiovascular: Normal rate, regular rhythm, normal heart sounds and. Exam reveals no gallop and no friction rub. No murmur heard.  Pulmonary/Chest: Effort normal and breath sounds normal. No stridor. No respiratory distress. He has no wheezes. He has no rales. He exhibits no tenderness.  Abdominal: Soft. Bowel sounds are normal. He exhibits no distension. There is no tenderness. There is no rebound and no guarding.  Musculoskeletal: Normal range of motion. He exhibits no edema and no tenderness.    Neurological: He is alert and oriented to person, place, and time. Coordination normal.  Skin: Skin is warm and dry. No rash noted. He is not diaphoretic. No erythema. No pallor.  Psychiatric: He has a normal mood and affect. His behavior is normal. Judgment and thought content normal.  Vascular: Radial pulses are normal bilaterally. Femoral pulses: Normal on both sides. Posterior tibial: +1 on the right side and not palpable on the left side.     ASSESSMENT AND PLAN

## 2011-06-28 NOTE — Assessment & Plan Note (Signed)
The patient has evidence of peripheral arterial disease with mild non-lifestyle limiting claudication. There was evidence of focal left SFA stenosis. I discussed different management options with the patient including continued medical therapy with a walking program versus proceeding with angiography and possible intervention. Given that his symptoms are overall mild, it's reasonable to treat him medically initially. He is unfortunately intolerant to statins. Continue treatment with Plavix. The patient prefers not to go on another medication and thus I will not add Pletal. I advised him to start a regular walking program. I plan on repeating his ABI in 6 months with a followup at that time to ensure stability. I instructed him to notify me if his symptoms get worse.

## 2011-07-20 ENCOUNTER — Other Ambulatory Visit: Payer: BC Managed Care – PPO

## 2011-07-21 ENCOUNTER — Other Ambulatory Visit (INDEPENDENT_AMBULATORY_CARE_PROVIDER_SITE_OTHER): Payer: BC Managed Care – PPO

## 2011-07-21 DIAGNOSIS — Z79899 Other long term (current) drug therapy: Secondary | ICD-10-CM

## 2011-07-21 LAB — BASIC METABOLIC PANEL
BUN: 18 mg/dL (ref 6–23)
Chloride: 107 mEq/L (ref 96–112)
Potassium: 4.5 mEq/L (ref 3.5–5.1)

## 2011-09-26 ENCOUNTER — Ambulatory Visit: Payer: BC Managed Care – PPO | Admitting: Cardiovascular Disease

## 2011-10-13 ENCOUNTER — Encounter: Payer: Self-pay | Admitting: Cardiovascular Disease

## 2011-10-13 ENCOUNTER — Ambulatory Visit (INDEPENDENT_AMBULATORY_CARE_PROVIDER_SITE_OTHER): Payer: BC Managed Care – PPO | Admitting: Cardiovascular Disease

## 2011-10-13 VITALS — BP 138/94 | HR 95 | Wt 203.0 lb

## 2011-10-13 DIAGNOSIS — E782 Mixed hyperlipidemia: Secondary | ICD-10-CM

## 2011-10-13 DIAGNOSIS — I739 Peripheral vascular disease, unspecified: Secondary | ICD-10-CM

## 2011-10-13 NOTE — Progress Notes (Signed)
Patient ID: Eric Dickerson, male   DOB: 29-Dec-1950, 61 y.o.   MRN: [id number] Eric Dickerson is seen today for F/U of CAD He had stenting of the RCA in 08/2007, and staged stenting of the Ramus in 09/2007. He has arthritis with bilateral hip and back pain that is worsening. S/P right hip replaccement in 2011 with no complications He has not had any sifnificant SSCP. He has mild exertional dyspnea. He has not had any palpitaions PND, orthopnea or syncope. Last myovue 8/10 with old anterior infarct no ischemia EF 63%. BP high in office today. He takes it at home regullarly and assures me that systolics usually run in the 120's and diastolics less than 80  Had all his teeth pulled and bled from his Plavix. Stents are 61 years old. He prefers to stay on Plavix  Has weakness in legs. Both sides heavy with exertion and sometimes can walk through it. No calf or buttock pain.  BP was high 2/13 Lopresser doubled.    ROS: Denies fever, malais, weight loss, blurry vision, decreased visual acuity, cough, sputum, SOB, hemoptysis, pleuritic pain, palpitaitons, heartburn, abdominal pain, melena, lower extremity edema, claudication, or rash.  All other systems reviewed and negative  General: Affect appropriate Healthy:  appears stated age HEENT: normal Neck supple with no adenopathy JVP normal no bruits no thyromegaly Lungs clear with no wheezing and good diaphragmatic motion Heart:  S1/S2 no murmur, no rub, gallop or click PMI normal Abdomen: benighn, BS positve, no tenderness, no AAA no bruit.  No HSM or HJR Distal pulses diminished left femoral bruit No edema Neuro non-focal Skin warm and dry No muscular weakness   Current Outpatient Prescriptions  Medication Sig Dispense Refill  . acetaminophen (TYLENOL) 325 MG tablet Take 325 mg by mouth as needed.      Marland Kitchen amLODipine (NORVASC) 10 MG tablet Take 1 tablet (10 mg total) by mouth daily.  30 tablet  11  . Multiple Vitamins-Minerals (CENTRUM SILVER PO) Take by mouth  daily.      Marland Kitchen PLAVIX 75 MG tablet TAKE ONE TABLET BY MOUTH EVERY DAY  30 each  12    Allergies  Olmesartan medoxomil; Quinapril hcl; Glucosamine; Rosuvastatin; and Verapamil  Electrocardiogram: 06/28/11  NSR rate 72 normal ECG  Assessment and Plan

## 2011-10-13 NOTE — Assessment & Plan Note (Signed)
Well controlled.  Continue current medications and low sodium Dash type diet.    

## 2011-10-13 NOTE — Patient Instructions (Signed)
Your physician recommends that you schedule a follow-up appointment in: November WITH DR ARIDA KNOWN CLAUDICATION   Your physician wants you to follow-up in:   YEAR WITH DR Haywood Filler will receive a reminder letter in the mail two months in advance. If you don't receive a letter, please call our office to schedule the follow-up appointment. Your physician recommends that you continue on your current medications as directed. Please refer to the Current Medication list given to you today. Your physician has requested that you have a lower extremity arterial exercise duplex. During this test, exercise and ultrasound are used to evaluate arterial blood flow in the legs. Allow one hour for this exam. There are no restrictions or special instructions. WITH ABI'S IN November AND SEE DR ARIDA  SAME DAY [facility]

## 2011-10-13 NOTE — Assessment & Plan Note (Signed)
Stable with no angina and good activity level.  Continue medical Rx  

## 2011-10-13 NOTE — Assessment & Plan Note (Signed)
Stable with mild claudication.  Per Dr Kirke Corin F/U with him in November with ABI's

## 2011-10-13 NOTE — Assessment & Plan Note (Signed)
Intolerant to statins continue diet Rx

## 2011-12-27 ENCOUNTER — Encounter: Payer: Self-pay | Admitting: Cardiovascular Disease

## 2011-12-27 ENCOUNTER — Ambulatory Visit: Payer: BC Managed Care – PPO | Admitting: Cardiovascular Disease

## 2011-12-27 ENCOUNTER — Ambulatory Visit (INDEPENDENT_AMBULATORY_CARE_PROVIDER_SITE_OTHER): Payer: BC Managed Care – PPO | Admitting: Cardiovascular Disease

## 2011-12-27 ENCOUNTER — Encounter (INDEPENDENT_AMBULATORY_CARE_PROVIDER_SITE_OTHER): Payer: BC Managed Care – PPO

## 2011-12-27 VITALS — BP 144/96 | HR 72 | Ht 74.0 in | Wt 204.4 lb

## 2011-12-27 DIAGNOSIS — I739 Peripheral vascular disease, unspecified: Secondary | ICD-10-CM

## 2011-12-27 NOTE — Patient Instructions (Addendum)
Your physician recommends that you schedule a follow-up appointment in: as needed  

## 2011-12-29 NOTE — Progress Notes (Signed)
HPI  This is a 61 year old gentleman who is here today for a followup visit regarding  peripheral arterial disease. The patient has known history of coronary artery disease status post two-vessel angioplasty and stent placement. He is a previous smoker. He he has mild claudication. Initial ABI was mildly reduced on the left side at 0.7 and normal on the right side. There was evidence of focal more than 50% stenosis in the mid left SFA.  His symptoms are overall stable. Claudication did not worsen. He is mostly bothered when he walks uphill. He does not have significant limitation walking on a flat level.  Allergies  Allergen Reactions  . Olmesartan Medoxomil     REACTION: rash  . Quinapril Hcl     REACTION: cough; ACE inhibitors are contraindicated because of a history of rash with dimensions of receptor blocker.  . Glucosamine     REACTION: Reaction not known  . Rosuvastatin     REACTION: muscle cramps  . Verapamil      Current Outpatient Prescriptions on File Prior to Visit  Medication Sig Dispense Refill  . acetaminophen (TYLENOL) 325 MG tablet Take 325 mg by mouth as needed.      Marland Kitchen amLODipine (NORVASC) 10 MG tablet Take 1 tablet (10 mg total) by mouth daily.  30 tablet  11  . Multiple Vitamins-Minerals (CENTRUM SILVER PO) Take by mouth daily.      Marland Kitchen PLAVIX 75 MG tablet TAKE ONE TABLET BY MOUTH EVERY DAY  30 each  12     Past Medical History  Diagnosis Date  . CAD (coronary artery disease)   . Other abnormal glucose   . Hyperlipidemia   . HTN (hypertension)   . ERECTILE DYSFUNCTION   . CORONARY ATHEROSCLEROSIS NATIVE CORONARY ARTERY   . GERD   . HIATAL HERNIA   . DIVERTICULOSIS, COLON   . HYPERPLASIA PROSTATE UNS W/O UR OBST & OTH LUTS   . DEGENERATIVE JOINT DISEASE, RIGHT HIP   . SPINAL STENOSIS, LUMBAR   . LUMBAR RADICULOPATHY, RIGHT   . ANEMIA, MILD      Past Surgical History  Procedure Date  . Coronary stent placement 2010     X 2  . Total hip  arthroplasty 2009  . Colonoscopy 2002    negative     Family History  Problem Relation Age of Onset  . Diabetes Maternal Grandmother   . Hypertension Father   . Stroke Brother     2 brothers ; 18 & 10  . Pulmonary embolism Brother   . Heart attack Father 56    smoker     History   Social History  . Marital Status: Married    Spouse Name: N/A    Number of Children: N/A  . Years of Education: N/A   Occupational History  . Not on file.   Social History Main Topics  . Smoking status: Former Smoker    Quit date: 02/06/1985  . Smokeless tobacco: Not on file  . Alcohol Use: No  . Drug Use:   . Sexually Active:    Other Topics Concern  . Not on file   Social History Narrative  . No narrative on file      PHYSICAL EXAM   BP 144/96  Pulse 72  Ht 6\' 2"  (1.88 m)  Wt 204 lb 6.4 oz (92.715 kg)  BMI 26.24 kg/m2  SpO2 98% Constitutional: He is oriented to person, place, and time. He appears well-developed and well-nourished.  No distress.  HENT: No nasal discharge.  Head: Normocephalic and atraumatic.  Eyes: Pupils are equal and round. Right eye exhibits no discharge. Left eye exhibits no discharge.  Neck: Normal range of motion. Neck supple. No JVD present. No thyromegaly present.  Cardiovascular: Normal rate, regular rhythm, normal heart sounds and. Exam reveals no gallop and no friction rub. No murmur heard.  Pulmonary/Chest: Effort normal and breath sounds normal. No stridor. No respiratory distress. He has no wheezes. He has no rales. He exhibits no tenderness.  Abdominal: Soft. Bowel sounds are normal. He exhibits no distension. There is no tenderness. There is no rebound and no guarding.  Musculoskeletal: Normal range of motion. He exhibits no edema and no tenderness.  Neurological: He is alert and oriented to person, place, and time. Coordination normal.  Skin: Skin is warm and dry. No rash noted. He is not diaphoretic. No erythema. No pallor.  Psychiatric: He  has a normal mood and affect. His behavior is normal. Judgment and thought content normal.  Vascular: Radial pulses are normal bilaterally. Femoral pulses: Normal on both sides. Posterior tibial: +1 on the right side and not palpable on the left side.     ASSESSMENT AND PLAN

## 2011-12-29 NOTE — Assessment & Plan Note (Signed)
He is overall stable with no worsening claudication. Repeat ABI is normal on the right side with no significant change in the left side at 0.74. Given that his symptoms are not lifestyle limiting, I recommend continuing medical therapy. I asked him to followup with me as needed if his symptoms worsen. Repeat ABI in 1-2 years might be helpful in identifying progression on the left side. Continue treatment for risk factors.

## 2012-03-25 ENCOUNTER — Other Ambulatory Visit: Payer: Self-pay

## 2012-03-25 MED ORDER — CLOPIDOGREL BISULFATE 75 MG PO TABS: ORAL_TABLET | ORAL | Status: DC

## 2012-04-10 ENCOUNTER — Encounter: Payer: Self-pay | Admitting: Physician Assistant

## 2012-04-10 ENCOUNTER — Ambulatory Visit (INDEPENDENT_AMBULATORY_CARE_PROVIDER_SITE_OTHER): Payer: BC Managed Care – PPO | Admitting: Physician Assistant

## 2012-04-10 VITALS — BP 168/110 | HR 76 | Ht 74.0 in | Wt 205.0 lb

## 2012-04-10 DIAGNOSIS — I2510 Atherosclerotic heart disease of native coronary artery without angina pectoris: Secondary | ICD-10-CM

## 2012-04-10 DIAGNOSIS — I739 Peripheral vascular disease, unspecified: Secondary | ICD-10-CM

## 2012-04-10 DIAGNOSIS — R079 Chest pain, unspecified: Secondary | ICD-10-CM

## 2012-04-10 DIAGNOSIS — E782 Mixed hyperlipidemia: Secondary | ICD-10-CM

## 2012-04-10 DIAGNOSIS — I10 Essential (primary) hypertension: Secondary | ICD-10-CM

## 2012-04-10 MED ORDER — ASPIRIN 81 MG PO TBEC: 81.0000 mg | DELAYED_RELEASE_TABLET | Freq: Every day | ORAL | Status: DC

## 2012-04-10 MED ORDER — FAMOTIDINE 20 MG PO TABS: 20.0000 mg | ORAL_TABLET | Freq: Two times a day (BID) | ORAL | Status: DC

## 2012-04-10 MED ORDER — NITROGLYCERIN 0.4 MG SL SUBL: 0.4000 mg | SUBLINGUAL_TABLET | SUBLINGUAL | Status: DC | PRN

## 2012-04-10 MED ORDER — METOPROLOL TARTRATE 25 MG PO TABS: 25.0000 mg | ORAL_TABLET | Freq: Two times a day (BID) | ORAL | Status: DC

## 2012-04-10 NOTE — Addendum Note (Signed)
Addended byAlben Spittle, Lorin Picket T on: 04/10/2012 05:11 PM   Modules accepted: Orders

## 2012-04-10 NOTE — Progress Notes (Signed)
[address] [place], [place]  [address] Phone: [phone], Fax:  [phone]  Date:  04/10/2012   ID:  Eric Dickerson, DOB 09-07-1950, MRN [id number]  PCP:  Marga Melnick, MD  Primary Cardiologist:  Dr. Charlton Haws   PV:  Dr. Lorine Bears    History of Present Illness: Eric Dickerson is a 62 y.o. male who returns for the evaluation of substernal chest pain with exertion.  He has a hx of CAD, s/p Cypher DES to the ramus and Promus DES x 2 to the RCA 08/2007 (minimal disease in LAD and CFX at that time), PAD, HL, HTN.  Myoview 8/10: EF 63%, inf thinning, small prior ant infarct, no ischemia.  Last seen by Dr. Eden Emms 9/13.  He has noted progressively worsening chest burning over the last 3 mos.  It is just like what he had prior to his PCI but not as bad.  He has not had this symptom since prior to his PCI.  Does note assoc dyspnea.  No jaw or arm pain.  No syncope.  No orthopnea, PND, edema.  Does note increased fatigue.  Has not tried NTG. Pain subsides quickly with rest.  He does note his BP going up and down lately.  Labs (10/11):  LDL 131 Labs (12/12):  K 4.1, creatinine 1.0, ALT 17, Hgb 13.4, TSH 2.21 Labs (6/13):    K 4.5, creatinine 1.2  Wt Readings from Last 3 Encounters:  04/10/12 205 lb (92.987 kg)  12/27/11 204 lb 6.4 oz (92.715 kg)  10/13/11 203 lb (92.08 kg)     Past Medical History  Diagnosis Date  . CAD (coronary artery disease)     a. CAD,  b. s/p Cypher DES to the ramus and Promus DES x 2 to the RCA 08/2007 (minimal disease in LAD and CFX at that time), c. Myoview 8/10: EF 63%, inf thinning, small prior ant infarct, no ischemia  . Other abnormal glucose   . Hyperlipidemia     statin intolerant  . HTN (hypertension)   . ERECTILE DYSFUNCTION   . GERD   . HIATAL HERNIA   . DIVERTICULOSIS, COLON   . HYPERPLASIA PROSTATE UNS W/O UR OBST & OTH LUTS   . DEGENERATIVE JOINT DISEASE, RIGHT HIP   . SPINAL STENOSIS, LUMBAR   . LUMBAR  RADICULOPATHY, RIGHT   . ANEMIA, MILD     Current Outpatient Prescriptions  Medication Sig Dispense Refill  . acetaminophen (TYLENOL) 325 MG tablet Take 325 mg by mouth as needed.      Marland Kitchen amLODipine (NORVASC) 10 MG tablet Take 1 tablet (10 mg total) by mouth daily.  30 tablet  11  . clopidogrel (PLAVIX) 75 MG tablet TAKE ONE TABLET BY MOUTH EVERY DAY  30 tablet  11  . Multiple Vitamins-Minerals (CENTRUM SILVER PO) Take by mouth daily.       No current facility-administered medications for this visit.    Allergies:    Allergies  Allergen Reactions  . Olmesartan Medoxomil     REACTION: rash  . Quinapril Hcl     REACTION: cough; ACE inhibitors are contraindicated because of a history of rash with dimensions of receptor blocker.  . Glucosamine     REACTION: Reaction not known  . Rosuvastatin     REACTION: muscle cramps  . Verapamil     Social History:  The patient  reports that he quit smoking about 27 years ago. He does not have any smokeless tobacco  history on file. He reports that he does not drink alcohol.   ROS:  Please see the history of present illness.   Notes long hx of urinary frequency.  Also notes somewhat increased symptoms of claudication.   All other systems reviewed and negative.   PHYSICAL EXAM: VS:  BP 168/110  Pulse 76  Ht 6\' 2"  (1.88 m)  Wt 205 lb (92.987 kg)  BMI 26.31 kg/m2 Well nourished, well developed, in no acute distress HEENT: normal Neck: no JVD Vascular: no carotid bruits Cardiac:  normal S1, S2; RRR; no murmur Lungs:  clear to auscultation bilaterally, no wheezing, rhonchi or rales Abd: soft, nontender, no hepatomegaly Ext: no edema Skin: warm and dry Neuro:  CNs 2-12 intact, no focal abnormalities noted  EKG:  NSR, HR 76, no acute changes.      ASSESSMENT AND PLAN:  1. Chest Pain:  He presents with symptoms c/w CCS class II-III angina.  He has not had symptoms like this since prior to his last PCI.  ECG is normal.  Symptoms are stable.   His BP is uncontrolled.  We discussed proceeding with cardiac cath.  I discussed this with Dr. Verne Carrow (DOD) who agreed.  Risks and benefits of cardiac catheterization have been discussed with the patient.  These include bleeding, infection, kidney damage, stroke, heart attack, death.  The patient understands these risks and is willing to proceed.  Will plan on arranging with Dr. Charlton Haws.   2. Coronary Artery Disease:  Arrange cardiac cath as noted.  Continue Plavix.  He has had intol to ASA in the past.  Add Pepcid 20 mg bid.  Restart ASA 81 mg QD.  Add metoprolol 25 mg bid.  Refill NTG prn Rx.  He knows to go to the ED if symptoms worsen. 3. Hypertension:  Add metoprolol as noted. 4. Hyperlipidemia:  He is intolerant to statins. 5. PAD:  Proceed with cardiac eval first.  If symptoms persist, may need earlier follow up with Dr. Lorine Bears. 6. Disposition:  Plan cardiac cath as noted and follow up as directed afterward.  Signed, Tereso Newcomer, PA-C  4:19 PM 04/10/2012

## 2012-04-10 NOTE — Patient Instructions (Addendum)
Your physician has requested that you have a cardiac catheterization LEFT HEART CATH ; WE WILL CALL YOU IN THE NEXT 1-2 DAYS WITH THE DATE AND TIME. Cardiac catheterization is used to diagnose and/or treat various heart conditions. Doctors may recommend this procedure for a number of different reasons. The most common reason is to evaluate chest pain. Chest pain can be a symptom of coronary artery disease (CAD), and cardiac catheterization can show whether plaque is narrowing or blocking your heart's arteries. This procedure is also used to evaluate the valves, as well as measure the blood flow and oxygen levels in different parts of your heart. For further information please visit [email or web address] Please follow instruction sheet, as given.  Your physician recommends that you return for lab work in: TODAY PRE CATH LABS, BMET, CBC W/DIFF, PT/INR  START PEPCID 20 MG 1 TABLET  TWICE DAILY START METOPROLOL 25 MG 1 TABLET TWICE DAILY START ASPIRIN 81 MG 1 TABLET DAILY  AN RX FOR NITROGLYCERIN HAS BEEN SENT IN AND YOU HAVE BEEN ADVISED AS TO HOW AND WHEN TO USE NTG

## 2012-04-11 ENCOUNTER — Telehealth: Payer: Self-pay | Admitting: Cardiovascular Disease

## 2012-04-11 LAB — CBC WITH DIFFERENTIAL/PLATELET
Basophils Absolute: 0 10*3/uL (ref 0.0–0.1)
Eosinophils Absolute: 0.2 10*3/uL (ref 0.0–0.7)
HCT: 38.9 % — ABNORMAL LOW (ref 39.0–52.0)
Hemoglobin: 12.8 g/dL — ABNORMAL LOW (ref 13.0–17.0)
Lymphs Abs: 1.5 10*3/uL (ref 0.7–4.0)
MCHC: 32.8 g/dL (ref 30.0–36.0)
MCV: 85.5 fl (ref 78.0–100.0)
Neutro Abs: 4.5 10*3/uL (ref 1.4–7.7)
RDW: 14.6 % (ref 11.5–14.6)

## 2012-04-11 LAB — BASIC METABOLIC PANEL
CO2: 26 mEq/L (ref 19–32)
Glucose, Bld: 92 mg/dL (ref 70–99)
Potassium: 4.1 mEq/L (ref 3.5–5.1)
Sodium: 139 mEq/L (ref 135–145)

## 2012-04-11 NOTE — Telephone Encounter (Signed)
New Problem:    Patient returned your call about his procedure on Monday.  Please call back.

## 2012-04-11 NOTE — Telephone Encounter (Signed)
Instruction letter faxed to pt.

## 2012-04-11 NOTE — Telephone Encounter (Signed)
N/A.  LMTC. 

## 2012-04-11 NOTE — Telephone Encounter (Signed)
Instructions were given verbally.  Pt to call back with his fax number.

## 2012-04-11 NOTE — Telephone Encounter (Signed)
Fax# [phone]

## 2012-04-12 ENCOUNTER — Telehealth: Payer: Self-pay | Admitting: *Deleted

## 2012-04-12 NOTE — Telephone Encounter (Signed)
Message copied by Tarri Fuller on Fri Apr 12, 2012 10:24 AM ------      Message from: [place], [place] T      Created: Thu Apr 11, 2012 11:25 PM       Labs ok for cath.      Tereso Newcomer, PA-C  11:25 PM 04/11/2012 ------

## 2012-04-12 NOTE — Telephone Encounter (Signed)
lmom pre cath labs ok for cath

## 2012-04-13 ENCOUNTER — Telehealth: Payer: Self-pay | Admitting: Physician Assistant

## 2012-04-13 NOTE — Telephone Encounter (Signed)
Received page that pt had called answering service about q re cath Monday. Tried to call back but got voicemail each time. LMOM to call us back if he still has a question. Dayna Dunn PA-C

## 2012-04-13 NOTE — Telephone Encounter (Signed)
Pt called back, wanting instructions on his Monday cath. Reiterated instructions that went out on his faxed letter 3/6 (he did not receive this due to fax problems). He verbalized understanding Dayna Dunn PA-C

## 2012-04-14 NOTE — H&P (Signed)
History and Physical  Date: 04/10/2012   ID: Eric Dickerson, DOB 1950/10/17, MRN [id number]   PCP: Marga Melnick, MD  Primary Cardiologist: Dr. Charlton Haws  PV: Dr. Lorine Bears   History of Present Illness:  Eric Dickerson is a 62 y.o. male who returns for the evaluation of substernal chest pain with exertion. He has a hx of CAD, s/p Cypher DES to the ramus and Promus DES x 2 to the RCA 08/2007 (minimal disease in LAD and CFX at that time), PAD, HL, HTN. Myoview 8/10: EF 63%, inf thinning, small prior ant infarct, no ischemia. Last seen by Dr. Eden Emms 9/13. He has noted progressively worsening chest burning over the last 3 mos. It is just like what he had prior to his PCI but not as bad. He has not had this symptom since prior to his PCI. Does note assoc dyspnea. No jaw or arm pain. No syncope. No orthopnea, PND, edema. Does note increased fatigue. Has not tried NTG. Pain subsides quickly with rest. He does note his BP going up and down lately.   Labs (10/11): LDL 131  Labs (12/12): K 4.1, creatinine 1.0, ALT 17, Hgb 13.4, TSH 2.21  Labs (6/13): K 4.5, creatinine 1.2    Wt Readings from Last 3 Encounters:   04/10/12  205 lb (92.987 kg)   12/27/11  204 lb 6.4 oz (92.715 kg)   10/13/11  203 lb (92.08 kg)     Past Medical History   Diagnosis  Date   .  CAD (coronary artery disease)      a. CAD, b. s/p Cypher DES to the ramus and Promus DES x 2 to the RCA 08/2007 (minimal disease in LAD and CFX at that time), c. Myoview 8/10: EF 63%, inf thinning, small prior ant infarct, no ischemia   .  Other abnormal glucose    .  Hyperlipidemia      statin intolerant   .  HTN (hypertension)    .  ERECTILE DYSFUNCTION    .  GERD    .  HIATAL HERNIA    .  DIVERTICULOSIS, COLON    .  HYPERPLASIA PROSTATE UNS W/O UR OBST & OTH LUTS    .  DEGENERATIVE JOINT DISEASE, RIGHT HIP    .  SPINAL STENOSIS, LUMBAR    .  LUMBAR RADICULOPATHY, RIGHT    .  ANEMIA, MILD      Current Outpatient Prescriptions     Medication  Sig  Dispense  Refill   .  acetaminophen (TYLENOL) 325 MG tablet  Take 325 mg by mouth as needed.     Marland Kitchen  amLODipine (NORVASC) 10 MG tablet  Take 1 tablet (10 mg total) by mouth daily.  30 tablet  11   .  clopidogrel (PLAVIX) 75 MG tablet  TAKE ONE TABLET BY MOUTH EVERY DAY  30 tablet  11   .  Multiple Vitamins-Minerals (CENTRUM SILVER PO)  Take by mouth daily.       No current facility-administered medications for this visit.   Allergies:  Allergies   Allergen  Reactions   .  Olmesartan Medoxomil      REACTION: rash   .  Quinapril Hcl      REACTION: cough; ACE inhibitors are contraindicated because of a history of rash with dimensions of receptor blocker.   .  Glucosamine      REACTION: Reaction not known   .  Rosuvastatin      REACTION: muscle  cramps   .  Verapamil      Social History: The patient reports that he quit smoking about 27 years ago. He does not have any smokeless tobacco history on file. He reports that he does not drink alcohol.    ROS: Please see the history of present illness. Notes long hx of urinary frequency. Also notes somewhat increased symptoms of claudication. All other systems reviewed and negative.   PHYSICAL EXAM:  VS: BP 168/110  Pulse 76  Ht 6\' 2"  (1.88 m)  Wt 205 lb (92.987 kg)  BMI 26.31 kg/m2  Well nourished, well developed, in no acute distress  HEENT: normal  Neck: no JVD  Vascular: no carotid bruits  Cardiac: normal S1, S2; RRR; no murmur  Lungs: clear to auscultation bilaterally, no wheezing, rhonchi or rales  Abd: soft, nontender, no hepatomegaly  Ext: no edema  Skin: warm and dry  Neuro: CNs 2-12 intact, no focal abnormalities noted   EKG: NSR, HR 76, no acute changes.   ASSESSMENT AND PLAN:  1. Chest Pain: He presents with symptoms c/w CCS class II-III angina. He has not had symptoms like this since prior to his last PCI. ECG is normal. Symptoms are stable. His BP is uncontrolled. We discussed proceeding with cardiac  cath. I discussed this with Dr. Verne Carrow (DOD) who agreed. Risks and benefits of cardiac catheterization have been discussed with the patient. These include bleeding, infection, kidney damage, stroke, heart attack, death. The patient understands these risks and is willing to proceed. Will plan on arranging with Dr. Charlton Haws.  2. Coronary Artery Disease: Arrange cardiac cath as noted. Continue Plavix. He has had intol to ASA in the past. Add Pepcid 20 mg bid. Restart ASA 81 mg QD. Add metoprolol 25 mg bid. Refill NTG prn Rx. He knows to go to the ED if symptoms worsen. 3. Hypertension: Add metoprolol as noted. 4. Hyperlipidemia: He is intolerant to statins. 5. PAD: Proceed with cardiac eval first. If symptoms persist, may need earlier follow up with Dr. Lorine Bears. 6. Disposition: Plan cardiac cath as noted and follow up as directed afterward.   Signed,  Tereso Newcomer, PA-C 4:19 PM 04/10/2012

## 2012-04-15 ENCOUNTER — Other Ambulatory Visit: Payer: Self-pay

## 2012-04-15 ENCOUNTER — Inpatient Hospital Stay (HOSPITAL_BASED_OUTPATIENT_CLINIC_OR_DEPARTMENT_OTHER)
Admission: RE | Admit: 2012-04-15 | Discharge: 2012-04-15 | Disposition: A | Discharge status: Home / Self Care | Payer: BC Managed Care – PPO | Source: Ambulatory Visit | Attending: Cardiovascular Disease | Admitting: Cardiovascular Disease

## 2012-04-15 ENCOUNTER — Encounter (HOSPITAL_BASED_OUTPATIENT_CLINIC_OR_DEPARTMENT_OTHER)
Admission: RE | Disposition: A | Discharge status: Home / Self Care | Payer: Self-pay | Source: Ambulatory Visit | Attending: Cardiovascular Disease

## 2012-04-15 ENCOUNTER — Ambulatory Visit (HOSPITAL_COMMUNITY)
Admission: RE | Admit: 2012-04-15 | Discharge: 2012-04-15 | Disposition: A | Discharge status: Home / Self Care | Payer: BC Managed Care – PPO | Source: Ambulatory Visit | Attending: Thoracic Surgery (Cardiothoracic Vascular Surgery) | Admitting: Thoracic Surgery (Cardiothoracic Vascular Surgery)

## 2012-04-15 DIAGNOSIS — I739 Peripheral vascular disease, unspecified: Secondary | ICD-10-CM | POA: Insufficient documentation

## 2012-04-15 DIAGNOSIS — I2510 Atherosclerotic heart disease of native coronary artery without angina pectoris: Secondary | ICD-10-CM | POA: Insufficient documentation

## 2012-04-15 DIAGNOSIS — E785 Hyperlipidemia, unspecified: Secondary | ICD-10-CM | POA: Insufficient documentation

## 2012-04-15 DIAGNOSIS — Z01810 Encounter for preprocedural cardiovascular examination: Secondary | ICD-10-CM

## 2012-04-15 DIAGNOSIS — R079 Chest pain, unspecified: Secondary | ICD-10-CM | POA: Insufficient documentation

## 2012-04-15 DIAGNOSIS — Z9861 Coronary angioplasty status: Secondary | ICD-10-CM | POA: Insufficient documentation

## 2012-04-15 DIAGNOSIS — I10 Essential (primary) hypertension: Secondary | ICD-10-CM | POA: Insufficient documentation

## 2012-04-15 SURGERY — JV LEFT AND RIGHT HEART CATHETERIZATION WITH CORONARY ANGIOGRAM

## 2012-04-15 MED ORDER — ACETAMINOPHEN 325 MG PO TABS: 650.0000 mg | ORAL_TABLET | ORAL | Status: DC | PRN

## 2012-04-15 MED ORDER — SODIUM CHLORIDE 0.9 % IV SOLN: 250.0000 mL | INTRAVENOUS | Status: DC | PRN

## 2012-04-15 MED ORDER — ASPIRIN EC 325 MG PO TBEC: 325.0000 mg | DELAYED_RELEASE_TABLET | Freq: Every day | ORAL | Status: DC

## 2012-04-15 MED ORDER — METOPROLOL TARTRATE 25 MG PO TABS: 50.0000 mg | ORAL_TABLET | Freq: Two times a day (BID) | ORAL | Status: DC

## 2012-04-15 MED ORDER — DIAZEPAM 2 MG PO TABS: 2.0000 mg | ORAL_TABLET | Freq: Three times a day (TID) | ORAL | Status: DC | PRN

## 2012-04-15 MED ORDER — ONDANSETRON HCL 4 MG/2ML IJ SOLN: 4.0000 mg | Freq: Four times a day (QID) | INTRAMUSCULAR | Status: DC | PRN

## 2012-04-15 MED ORDER — SODIUM CHLORIDE 0.9 % IJ SOLN: 3.0000 mL | INTRAMUSCULAR | Status: DC | PRN

## 2012-04-15 MED ORDER — SODIUM CHLORIDE 0.9 % IV SOLN: INTRAVENOUS | Status: DC

## 2012-04-15 MED ORDER — SODIUM CHLORIDE 0.9 % IJ SOLN: 3.0000 mL | Freq: Two times a day (BID) | INTRAMUSCULAR | Status: DC

## 2012-04-15 MED ORDER — ASPIRIN 81 MG PO CHEW
324.0000 mg | CHEWABLE_TABLET | ORAL | Status: AC
  Administered 2012-04-15: 243 mg via ORAL

## 2012-04-15 MED ORDER — SODIUM CHLORIDE 0.45 % IV SOLN: INTRAVENOUS | Status: AC

## 2012-04-15 MED ORDER — OXYCODONE-ACETAMINOPHEN 5-325 MG PO TABS: 1.0000 | ORAL_TABLET | ORAL | Status: DC | PRN

## 2012-04-15 NOTE — OR Nursing (Signed)
Discharge instructions reviewed and signed, pt stated understanding, ambulated in hall without difficulty, site level 0, transported to H&V front desk for further tests this afternoon

## 2012-04-15 NOTE — OR Nursing (Signed)
Tegaderm dressing applied, site level 0, bedrest begins at 0915 

## 2012-04-15 NOTE — Progress Notes (Signed)
VASCULAR LAB PRELIMINARY  PRELIMINARY  PRELIMINARY  PRELIMINARY  Pre-op Cardiac Surgery  Carotid Findings:  Bilateral:  No evidence of hemodynamically significant internal carotid artery stenosis.   Vertebral artery flow is antegrade.      Upper Extremity Right Left  Brachial Pressures 133 T 128 T  Radial Waveforms T T  Ulnar Waveforms T T  Palmar Arch (Allen's Test) WNL WNL   Findings:  Doppler waveforms remain normal with ulnar and radial compressions bilaterally.    Lower  Extremity Right Left  Dorsalis Pedis    Anterior Tibial 122 T 81 M  Posterior Tibial 87 T 66 M  Ankle/Brachial Indices 0.92  0.61    Findings:  Right:  ABI indicates a mild reduction in arterial flow.  Left:  ABI indicates a moderate reduction in arterial flow.   BIGGS, SANDRA, RVT 04/15/2012, 2:15 PM

## 2012-04-15 NOTE — Interval H&P Note (Signed)
History and Physical Interval Note:  04/15/2012 8:32 AM  Eric Dickerson  has presented today for surgery, with the diagnosis of cp  The various methods of treatment have been discussed with the patient and family. After consideration of risks, benefits and other options for treatment, the patient has consented to  Procedure(s): JV LEFT AND RIGHT HEART CATHETERIZATION WITH CORONARY ANGIOGRAM (N/A) as a surgical intervention .  The patient's history has been reviewed, patient examined, no change in status, stable for surgery.  I have reviewed the patient's chart and labs.  Questions were answered to the patient's satisfaction.     Charlton Haws

## 2012-04-15 NOTE — OR Nursing (Signed)
Meal served 

## 2012-04-15 NOTE — CV Procedure (Signed)
      Catheterization   Indication:  Chest pain known CAD with previous stents to RCA and IM2  Procedure: After informed consent and clinical "time out" the right groin was prepped and draped in a sterile fashion.  A 5Fr sheath was placed in the right femoral artery using seldinger technique and local lidocaine.  Standard JL4, JR4 and angled pigtail catheters were used to engage the coronary arteries.  Coronary arteries were visualized in orthogonal views using caudal and cranial angulation.  RAO ventriculography was done using 28* cc of contrast.    Medications:   Versed: 2 mg's  Fentanyl: 0 ug's  Coronary Arteries: Right dominant with no anomalies  LM: 20% distal  LAD: 80% ostial  40% multiple discrete lesions in mid vessel    IM: patent stent  D1: normal  Circumflex: 95% ostial disease    OM1: Normal  RCA:  Proximal and mid stents widely patent   PDA:  40% at crux  PLA: small  Ventriculography: EF: 55 %,  No discrete RWMA;s  Hemodynamics:  Aortic Pressure:  140 80  mmHg  LV Pressure: 144 10  mmHg  Impression:   Unfortunately has developed ostial disease in LAD and circumflex.  This is not manageable with repeat stenting.  Has no rest symptoms.  Will refer to CVTS for CABG Tolerated procedure well with no complications.    Charlton Haws 04/15/2012 9:00 AM

## 2012-04-16 ENCOUNTER — Other Ambulatory Visit: Payer: Self-pay | Admitting: *Deleted

## 2012-04-16 ENCOUNTER — Encounter (HOSPITAL_COMMUNITY): Payer: Self-pay | Admitting: Pharmacy Technician

## 2012-04-16 ENCOUNTER — Institutional Professional Consult (permissible substitution) (INDEPENDENT_AMBULATORY_CARE_PROVIDER_SITE_OTHER): Payer: BC Managed Care – PPO | Admitting: Thoracic Surgery (Cardiothoracic Vascular Surgery)

## 2012-04-16 ENCOUNTER — Encounter: Payer: Self-pay | Admitting: Thoracic Surgery (Cardiothoracic Vascular Surgery)

## 2012-04-16 VITALS — BP 136/88 | HR 85 | Resp 20 | Ht 74.0 in | Wt 205.0 lb

## 2012-04-16 DIAGNOSIS — I2510 Atherosclerotic heart disease of native coronary artery without angina pectoris: Secondary | ICD-10-CM

## 2012-04-16 NOTE — Progress Notes (Signed)
PCP is Eric Melnick, MD Referring Eric Dickerson is Eric Stade, MD  Chief Complaint  Patient presents with  . Coronary Artery Disease    Cardiac Cath and Dopplers per CABG  04/15/12     HPI: 62 year old gentleman presents with chief complaint of exertional chest pain.  Eric Dickerson is a 62 year old gentleman with a history of coronary disease. He previously had PTCA and stenting of the ramus intermedius and right coronary in 2009. Recently he began having exertional chest pain. He describes this as a burning sensation it only occurs when he is walking up an incline or stairs. This is relieved by rest. He has not had any rest or nocturnal symptoms. The symptoms were similar to the ones that he had prior to his angioplasty in 2009.  He saw Dr. Eden Dickerson recommended cardiac catheterization. Catheterization revealed that the previous stents were patent. However he has new tight ostial lesions of both the LAD and circumflex. These are not amenable to angioplasty.   Past Medical History  Diagnosis Date  . CAD (coronary artery disease)     a. CAD,  b. s/p Cypher DES to the ramus and Promus DES x 2 to the RCA 08/2007 (minimal disease in LAD and CFX at that time), c. Myoview 8/10: EF 63%, inf thinning, small prior ant infarct, no ischemia  . Other abnormal glucose   . Hyperlipidemia     statin intolerant  . HTN (hypertension)   . ERECTILE DYSFUNCTION   . GERD   . HIATAL HERNIA   . DIVERTICULOSIS, COLON   . HYPERPLASIA PROSTATE UNS W/O UR OBST & OTH LUTS   . DEGENERATIVE JOINT DISEASE, RIGHT HIP   . SPINAL STENOSIS, LUMBAR   . LUMBAR RADICULOPATHY, RIGHT   . ANEMIA, MILD     Past Surgical History  Procedure Laterality Date  . Coronary stent placement  2010     X 2  . Total hip arthroplasty  2009  . Colonoscopy  2002    negative  . Coronary angioplasty with stent placement Bilateral 2009    Stents to Intermediate and RCA    Family History  Problem Relation Age of Onset  . Diabetes  Maternal Grandmother   . Hypertension Father   . Heart attack Father 13    smoker  . Stroke Brother     2 brothers ; 34 & 53  . Hyperlipidemia Brother   . Pulmonary embolism Brother   . Hyperlipidemia Mother     Social History History  Substance Use Topics  . Smoking status: Former Smoker    Quit date: 02/06/1985  . Smokeless tobacco: Not on file  . Alcohol Use: No    Current Outpatient Prescriptions  Medication Sig Dispense Refill  . amLODipine (NORVASC) 10 MG tablet Take 1 tablet (10 mg total) by mouth daily.  30 tablet  11  . aspirin 81 MG EC tablet Take 1 tablet (81 mg total) by mouth daily. Swallow whole.      . famotidine (PEPCID) 20 MG tablet Take 1 tablet (20 mg total) by mouth 2 (two) times daily.  60 tablet  3  . metoprolol tartrate (LOPRESSOR) 25 MG tablet Take 2 tablets (50 mg total) by mouth 2 (two) times daily.  60 tablet  11  . Multiple Vitamins-Minerals (CENTRUM SILVER PO) Take by mouth daily.      . nitroGLYCERIN (NITROSTAT) 0.4 MG SL tablet Place 1 tablet (0.4 mg total) under the tongue every 5 (five) minutes as needed for chest  pain.  25 tablet  3   No current facility-administered medications for this visit.    Allergies  Allergen Reactions  . Olmesartan Medoxomil     REACTION: rash  . Quinapril Hcl     REACTION: cough; ACE inhibitors are contraindicated because of a history of rash with dimensions of receptor blocker.  . Glucosamine     REACTION: Reaction not known  . Rosuvastatin     REACTION: muscle cramps  . Verapamil     Review of Systems  Constitutional: Positive for fatigue.  Respiratory: Negative.   Cardiovascular: Positive for chest pain. Negative for leg swelling.       Mild claudication- stable  Genitourinary: Positive for frequency.       Erectile dysfunction  Neurological: Negative.   Hematological: Negative.   All other systems reviewed and are negative.    BP 136/88  Pulse 85  Resp 20  Ht 6\' 2"  (1.88 m)  Wt 205 lb  (92.987 kg)  BMI 26.31 kg/m2  SpO2 98% Physical Exam  Vitals reviewed. Constitutional: He is oriented to person, place, and time. He appears well-developed and well-nourished. No distress.  HENT:  Head: Normocephalic and atraumatic.  Eyes: EOM are normal. Pupils are equal, round, and reactive to light.  Neck: Neck supple. No thyromegaly present.  No bruits  Cardiovascular: Normal rate, regular rhythm and normal heart sounds.  Exam reveals no gallop and no friction rub.   No murmur heard. Unable to palpate DP or PT bilaterally  Pulmonary/Chest: Effort normal and breath sounds normal. He has no wheezes. He has no rales.  Abdominal: Soft. There is no tenderness.  Musculoskeletal: He exhibits no edema.  Lymphadenopathy:    He has no cervical adenopathy.  Neurological: He is alert and oriented to person, place, and time. No cranial nerve deficit.  Skin: Skin is warm and dry.  Psychiatric: He has a normal mood and affect.     Diagnostic Tests: CARDIAC CATHETERIZATION Coronary Arteries:  Right dominant with no anomalies  LM: 20% distal   LAD: 80% ostial 40% multiple discrete lesions in mid vessel  IM: patent stent  D1: normal  Circumflex: 95% ostial disease  OM1: Normal  RCA: Proximal and mid stents widely patent  PDA: 40% at crux  PLA: small  Ventriculography: EF: 55 %, No discrete RWMA;s  Hemodynamics:  Aortic Pressure: 140 80 mmHg LV Pressure: 144 10 mmHg  Impression: Unfortunately has developed ostial disease in LAD and circumflex. This is not manageable with repeat stenting. Has no rest symptoms. Will refer to CVTS for CABG  Tolerated procedure well with no complications.  Eric Dickerson  04/15/2012  9:00 AM   Impression: 62 year old with multiple cardiac risk factors and known coronary disease who presents with recent onset exertional angina. A catheterization he has tight ostial lesions of both the LAD and circumflex which are not amenable to percutaneous intervention.  Coronary bypass grafting is indicated for survival benefit as well as relief of symptoms.  He would benefit from bilateral IMA grafting given his young age.  I discussed with him the general nature of the procedure, need for general anesthesia ,and  incisions to be used. We will plan to use bilateral mammary arteries, and only need to harvest saphenous vein if there is a technical problem with one of the mammaries. I discussed the expected hospital stay, overall recovery and short and long term outcomes. He understands the risks include, but are not limited to, death, stroke, MI, DVT/PE, bleeding, possible  need for transfusion, infections, cardiac arrhythmias, and other organ system dysfunction including respiratory, renal, or GI complications.  He does understand this is a palliative and not a curative procedure, and that he may need further cardiac interventions in the future. He They understands and accepts the risks and agree to proceed.   Plan: CABG with bilateral IMA grafting on Monday 04/22/12

## 2012-04-18 ENCOUNTER — Ambulatory Visit (HOSPITAL_COMMUNITY)
Admission: RE | Admit: 2012-04-18 | Discharge: 2012-04-18 | Disposition: A | Discharge status: Home / Self Care | Payer: BC Managed Care – PPO | Source: Ambulatory Visit | Attending: Thoracic Surgery (Cardiothoracic Vascular Surgery) | Admitting: Thoracic Surgery (Cardiothoracic Vascular Surgery)

## 2012-04-18 ENCOUNTER — Encounter (HOSPITAL_COMMUNITY): Payer: Self-pay

## 2012-04-18 ENCOUNTER — Telehealth: Payer: Self-pay | Admitting: *Deleted

## 2012-04-18 ENCOUNTER — Encounter (HOSPITAL_COMMUNITY)
Admission: RE | Admit: 2012-04-18 | Discharge: 2012-04-18 | Disposition: A | Discharge status: Home / Self Care | Payer: BC Managed Care – PPO | Source: Ambulatory Visit | Attending: Thoracic Surgery (Cardiothoracic Vascular Surgery) | Admitting: Thoracic Surgery (Cardiothoracic Vascular Surgery)

## 2012-04-18 ENCOUNTER — Encounter (HOSPITAL_COMMUNITY): Payer: BC Managed Care – PPO

## 2012-04-18 ENCOUNTER — Inpatient Hospital Stay (HOSPITAL_COMMUNITY)
Admission: RE | Admit: 2012-04-18 | Discharge: 2012-04-18 | Disposition: A | Discharge status: Home / Self Care | Payer: BC Managed Care – PPO | Source: Ambulatory Visit | Attending: Thoracic Surgery (Cardiothoracic Vascular Surgery) | Admitting: Thoracic Surgery (Cardiothoracic Vascular Surgery)

## 2012-04-18 VITALS — BP 126/76 | HR 72 | Temp 98.0°F | Resp 20 | Ht 74.0 in | Wt 205.0 lb

## 2012-04-18 DIAGNOSIS — Z01811 Encounter for preprocedural respiratory examination: Secondary | ICD-10-CM | POA: Insufficient documentation

## 2012-04-18 DIAGNOSIS — Z01818 Encounter for other preprocedural examination: Secondary | ICD-10-CM | POA: Insufficient documentation

## 2012-04-18 DIAGNOSIS — Z01810 Encounter for preprocedural cardiovascular examination: Secondary | ICD-10-CM | POA: Insufficient documentation

## 2012-04-18 DIAGNOSIS — Z01812 Encounter for preprocedural laboratory examination: Secondary | ICD-10-CM | POA: Insufficient documentation

## 2012-04-18 DIAGNOSIS — R9431 Abnormal electrocardiogram [ECG] [EKG]: Secondary | ICD-10-CM | POA: Insufficient documentation

## 2012-04-18 LAB — CBC
Hemoglobin: 12.9 g/dL — ABNORMAL LOW (ref 13.0–17.0)
MCH: 28.6 pg (ref 26.0–34.0)
MCV: 84.3 fL (ref 78.0–100.0)
Platelets: 260 10*3/uL (ref 150–400)
RBC: 4.51 MIL/uL (ref 4.22–5.81)
WBC: 9.9 10*3/uL (ref 4.0–10.5)

## 2012-04-18 LAB — COMPREHENSIVE METABOLIC PANEL
ALT: 18 U/L (ref 0–53)
AST: 19 U/L (ref 0–37)
CO2: 22 mEq/L (ref 19–32)
Chloride: 104 mEq/L (ref 96–112)
Creatinine, Ser: 0.92 mg/dL (ref 0.50–1.35)
GFR calc Af Amer: >90 mL/min (ref >90)
GFR calc non Af Amer: 89 mL/min — ABNORMAL LOW (ref >90)
Glucose, Bld: 98 mg/dL (ref 70–99)
Total Bilirubin: 0.2 mg/dL — ABNORMAL LOW (ref 0.3–1.2)

## 2012-04-18 LAB — TYPE AND SCREEN: Antibody Screen: NEGATIVE

## 2012-04-18 LAB — BLOOD GAS, ARTERIAL
Acid-Base Excess: 1.2 mmol/L (ref 0.0–2.0)
Bicarbonate: 25.2 mEq/L — ABNORMAL HIGH (ref 20.0–24.0)
Drawn by: 344381
O2 Saturation: 97.1 %
TCO2: 26.5 mmol/L (ref 0–100)
pCO2 arterial: 39.8 mmHg (ref 35.0–45.0)
pO2, Arterial: 87.5 mmHg (ref 80.0–100.0)

## 2012-04-18 LAB — URINALYSIS, ROUTINE W REFLEX MICROSCOPIC
Nitrite: NEGATIVE
Specific Gravity, Urine: 1.026 (ref 1.005–1.030)
Urobilinogen, UA: 0.2 mg/dL (ref 0.0–1.0)
pH: 5.5 (ref 5.0–8.0)

## 2012-04-18 LAB — HEMOGLOBIN A1C: Mean Plasma Glucose: 126 mg/dL — ABNORMAL HIGH (ref <117)

## 2012-04-18 LAB — SURGICAL PCR SCREEN
MRSA, PCR: NEGATIVE
Staphylococcus aureus: NEGATIVE

## 2012-04-18 LAB — PULMONARY FUNCTION TEST

## 2012-04-18 LAB — APTT: aPTT: 30 seconds (ref 24–37)

## 2012-04-18 NOTE — Pre-Procedure Instructions (Signed)
Eric Dickerson  04/18/2012  Your procedure is scheduled on: Monday, March 17th   Report to [facility] at 5:30 AM.             (Posted surgical time - 7:30 am - 12:52 pm)   Call this number if you have problems the morning of surgery: [phone]   Remember:   Do not eat food or drink liquids after midnight Sunday.   Take these medicines the morning of surgery with A SIP OF WATER: Pepcid, Metoprolol   Do not wear jewelry, no rings, or watches  Do not wear lotions, powders, or colognes. You may NOT wear deodorant.   Men may shave face and neck.   Do not bring valuables to the hospital.  Contacts, dentures or bridgework may not be worn into surgery.   Leave suitcase in the car. After surgery it may be brought to your room.  For patients admitted to the hospital, checkout time is 11:00 AM the day of discharge.   Please bring back to hospital, the incentive spirometry and Cardiac Surgery Booklet.   Name and phone number of your driver:    Special Instructions: Shower using CHG 2 nights before surgery and the night before surgery.  If you shower the day of surgery use CHG.  Use special wash - you have one bottle of CHG for all showers.  You should use approximately 1/3 of the bottle for each shower.   Please read over the following fact sheets that you were given: Pain Booklet, Coughing and Deep Breathing, Blood Transfusion Information, Open Heart Packet, MRSA Information and Surgical Site Infection Prevention

## 2012-04-18 NOTE — Progress Notes (Signed)
Pt given Albuterol 2.5mg  neb with PFT. Unable to enter order and chart in the [facility]. Pt ID verified by name and DOB. Pt tol well, no complications.

## 2012-04-18 NOTE — Telephone Encounter (Signed)
Completed heart surgery education with patient.  Book given.  Answered all questions.  Told to call office if any other concerns came up.

## 2012-04-21 MED ORDER — PHENYLEPHRINE HCL 10 MG/ML IJ SOLN
30.0000 ug/min | INTRAVENOUS | Status: AC
  Administered 2012-04-22: 20 ug/min via INTRAVENOUS
  Filled 2012-04-21: qty 2

## 2012-04-21 MED ORDER — DEXMEDETOMIDINE HCL IN NACL 400 MCG/100ML IV SOLN
0.1000 ug/kg/h | INTRAVENOUS | Status: AC
  Administered 2012-04-22: 0.2 ug/kg/h via INTRAVENOUS
  Filled 2012-04-21: qty 100

## 2012-04-21 MED ORDER — CEFUROXIME SODIUM 1.5 G IJ SOLR
1.5000 g | INTRAMUSCULAR | Status: AC
  Administered 2012-04-22: .75 g via INTRAVENOUS
  Administered 2012-04-22: 1.5 g via INTRAVENOUS
  Filled 2012-04-21 (×2): qty 1.5

## 2012-04-21 MED ORDER — DOPAMINE-DEXTROSE 3.2-5 MG/ML-% IV SOLN
2.0000 ug/kg/min | INTRAVENOUS | Status: DC
  Filled 2012-04-21: qty 250

## 2012-04-21 MED ORDER — NITROGLYCERIN IN D5W 200-5 MCG/ML-% IV SOLN
2.0000 ug/min | INTRAVENOUS | Status: AC
  Administered 2012-04-22: 5 ug/min via INTRAVENOUS
  Filled 2012-04-21: qty 250

## 2012-04-21 MED ORDER — METOPROLOL TARTRATE 12.5 MG HALF TABLET: 12.5000 mg | ORAL_TABLET | Freq: Once | ORAL | Status: DC

## 2012-04-21 MED ORDER — EPINEPHRINE HCL 1 MG/ML IJ SOLN
0.5000 ug/min | INTRAVENOUS | Status: DC
  Filled 2012-04-21: qty 4

## 2012-04-21 MED ORDER — VANCOMYCIN HCL 10 G IV SOLR
1250.0000 mg | INTRAVENOUS | Status: AC
  Administered 2012-04-22: 1250 mg via INTRAVENOUS
  Filled 2012-04-21: qty 1250

## 2012-04-21 MED ORDER — VERAPAMIL HCL 2.5 MG/ML IV SOLN
INTRAVENOUS | Status: AC
  Administered 2012-04-22: 09:00:00
  Filled 2012-04-21: qty 2.5

## 2012-04-21 MED ORDER — SODIUM CHLORIDE 0.9 % IV SOLN
INTRAVENOUS | Status: AC
  Administered 2012-04-22: 1.7 [IU]/h via INTRAVENOUS
  Filled 2012-04-21: qty 1

## 2012-04-21 MED ORDER — DEXTROSE 5 % IV SOLN
750.0000 mg | INTRAVENOUS | Status: DC
  Filled 2012-04-21 (×2): qty 750

## 2012-04-21 MED ORDER — SODIUM CHLORIDE 0.9 % IV SOLN
INTRAVENOUS | Status: AC
  Administered 2012-04-22: 69.8 mL/h via INTRAVENOUS
  Filled 2012-04-21: qty 40

## 2012-04-21 MED ORDER — MAGNESIUM SULFATE 50 % IJ SOLN
40.0000 meq | INTRAMUSCULAR | Status: DC
  Filled 2012-04-21: qty 10

## 2012-04-21 MED ORDER — POTASSIUM CHLORIDE 2 MEQ/ML IV SOLN
80.0000 meq | INTRAVENOUS | Status: DC
  Filled 2012-04-21: qty 40

## 2012-04-22 ENCOUNTER — Encounter (HOSPITAL_COMMUNITY)
Admission: RE | Disposition: A | Discharge status: Home / Self Care | Payer: Self-pay | Source: Ambulatory Visit | Attending: Thoracic Surgery (Cardiothoracic Vascular Surgery)

## 2012-04-22 ENCOUNTER — Encounter (HOSPITAL_COMMUNITY): Payer: Self-pay | Admitting: *Deleted

## 2012-04-22 ENCOUNTER — Encounter (HOSPITAL_COMMUNITY): Payer: Self-pay | Admitting: Certified Registered"

## 2012-04-22 ENCOUNTER — Inpatient Hospital Stay (HOSPITAL_COMMUNITY): Payer: BC Managed Care – PPO | Admitting: Certified Registered"

## 2012-04-22 ENCOUNTER — Inpatient Hospital Stay (HOSPITAL_COMMUNITY): Payer: BC Managed Care – PPO

## 2012-04-22 ENCOUNTER — Inpatient Hospital Stay (HOSPITAL_COMMUNITY)
Admission: RE | Admit: 2012-04-22 | Discharge: 2012-04-26 | DRG: 109 | Disposition: A | Discharge status: Home / Self Care | Payer: BC Managed Care – PPO | Source: Ambulatory Visit | Attending: Thoracic Surgery (Cardiothoracic Vascular Surgery) | Admitting: Thoracic Surgery (Cardiothoracic Vascular Surgery)

## 2012-04-22 DIAGNOSIS — N400 Enlarged prostate without lower urinary tract symptoms: Secondary | ICD-10-CM | POA: Diagnosis present

## 2012-04-22 DIAGNOSIS — Z87891 Personal history of nicotine dependence: Secondary | ICD-10-CM

## 2012-04-22 DIAGNOSIS — K449 Diaphragmatic hernia without obstruction or gangrene: Secondary | ICD-10-CM | POA: Diagnosis present

## 2012-04-22 DIAGNOSIS — E785 Hyperlipidemia, unspecified: Secondary | ICD-10-CM | POA: Diagnosis present

## 2012-04-22 DIAGNOSIS — Z888 Allergy status to other drugs, medicaments and biological substances status: Secondary | ICD-10-CM

## 2012-04-22 DIAGNOSIS — I739 Peripheral vascular disease, unspecified: Secondary | ICD-10-CM | POA: Diagnosis present

## 2012-04-22 DIAGNOSIS — Z9861 Coronary angioplasty status: Secondary | ICD-10-CM

## 2012-04-22 DIAGNOSIS — J9819 Other pulmonary collapse: Secondary | ICD-10-CM | POA: Diagnosis not present

## 2012-04-22 DIAGNOSIS — I10 Essential (primary) hypertension: Secondary | ICD-10-CM | POA: Diagnosis present

## 2012-04-22 DIAGNOSIS — D62 Acute posthemorrhagic anemia: Secondary | ICD-10-CM | POA: Diagnosis not present

## 2012-04-22 DIAGNOSIS — M48061 Spinal stenosis, lumbar region without neurogenic claudication: Secondary | ICD-10-CM | POA: Diagnosis present

## 2012-04-22 DIAGNOSIS — I2510 Atherosclerotic heart disease of native coronary artery without angina pectoris: Principal | ICD-10-CM | POA: Diagnosis present

## 2012-04-22 DIAGNOSIS — Z8719 Personal history of other diseases of the digestive system: Secondary | ICD-10-CM

## 2012-04-22 DIAGNOSIS — I209 Angina pectoris, unspecified: Secondary | ICD-10-CM | POA: Diagnosis present

## 2012-04-22 DIAGNOSIS — K219 Gastro-esophageal reflux disease without esophagitis: Secondary | ICD-10-CM | POA: Diagnosis present

## 2012-04-22 DIAGNOSIS — R7309 Other abnormal glucose: Secondary | ICD-10-CM | POA: Diagnosis present

## 2012-04-22 DIAGNOSIS — M1610 Unilateral primary osteoarthritis, unspecified hip: Secondary | ICD-10-CM | POA: Diagnosis present

## 2012-04-22 DIAGNOSIS — Z7982 Long term (current) use of aspirin: Secondary | ICD-10-CM

## 2012-04-22 DIAGNOSIS — Z8249 Family history of ischemic heart disease and other diseases of the circulatory system: Secondary | ICD-10-CM

## 2012-04-22 DIAGNOSIS — Z96649 Presence of unspecified artificial hip joint: Secondary | ICD-10-CM

## 2012-04-22 DIAGNOSIS — Z79899 Other long term (current) drug therapy: Secondary | ICD-10-CM

## 2012-04-22 DIAGNOSIS — I2584 Coronary atherosclerosis due to calcified coronary lesion: Secondary | ICD-10-CM | POA: Diagnosis present

## 2012-04-22 HISTORY — PX: CORONARY ARTERY BYPASS GRAFT: SHX141

## 2012-04-22 HISTORY — PX: TEE WITHOUT CARDIOVERSION: SHX5443

## 2012-04-22 LAB — POCT I-STAT 3, ART BLOOD GAS (G3+)
Acid-Base Excess: 2 mmol/L (ref 0.0–2.0)
Acid-base deficit: 1 mmol/L (ref 0.0–2.0)
Bicarbonate: 23.7 mEq/L (ref 20.0–24.0)
O2 Saturation: 100 %
O2 Saturation: 100 %
O2 Saturation: 97 %
TCO2: 28 mmol/L (ref 0–100)
TCO2: 25 mmol/L (ref 0–100)
TCO2: 24 mmol/L (ref 0–100)
pCO2 arterial: 35.1 mmHg (ref 35.0–45.0)
pCO2 arterial: 38.9 mmHg (ref 35.0–45.0)
pCO2 arterial: 42.6 mmHg (ref 35.0–45.0)
pH, Arterial: 7.436 (ref 7.350–7.450)
pH, Arterial: 7.332 — ABNORMAL LOW (ref 7.350–7.450)
pO2, Arterial: 293 mmHg — ABNORMAL HIGH (ref 80.0–100.0)
pO2, Arterial: 179 mmHg — ABNORMAL HIGH (ref 80.0–100.0)
pO2, Arterial: 105 mmHg — ABNORMAL HIGH (ref 80.0–100.0)

## 2012-04-22 LAB — MAGNESIUM: Magnesium: 2.6 mg/dL — ABNORMAL HIGH (ref 1.5–2.5)

## 2012-04-22 LAB — POCT I-STAT 4, (NA,K, GLUC, HGB,HCT)
Glucose, Bld: 101 mg/dL — ABNORMAL HIGH (ref 70–99)
Glucose, Bld: 131 mg/dL — ABNORMAL HIGH (ref 70–99)
Glucose, Bld: 112 mg/dL — ABNORMAL HIGH (ref 70–99)
HCT: 33 % — ABNORMAL LOW (ref 39.0–52.0)
HCT: 28 % — ABNORMAL LOW (ref 39.0–52.0)
HCT: 33 % — ABNORMAL LOW (ref 39.0–52.0)
Hemoglobin: 8.5 g/dL — ABNORMAL LOW (ref 13.0–17.0)
Hemoglobin: 9.5 g/dL — ABNORMAL LOW (ref 13.0–17.0)
Potassium: 4.1 mEq/L (ref 3.5–5.1)
Potassium: 3.9 mEq/L (ref 3.5–5.1)
Sodium: 141 mEq/L (ref 135–145)
Sodium: 138 mEq/L (ref 135–145)
Sodium: 138 mEq/L (ref 135–145)
Sodium: 140 mEq/L (ref 135–145)

## 2012-04-22 LAB — CBC
HCT: 32 % — ABNORMAL LOW (ref 39.0–52.0)
HCT: 26.9 % — ABNORMAL LOW (ref 39.0–52.0)
Hemoglobin: 9.5 g/dL — ABNORMAL LOW (ref 13.0–17.0)
MCH: 28.4 pg (ref 26.0–34.0)
MCHC: 34.6 g/dL (ref 30.0–36.0)
MCV: 82.1 fL (ref 78.0–100.0)
MCV: 83.3 fL (ref 78.0–100.0)
Platelets: 208 10*3/uL (ref 150–400)
Platelets: 179 10*3/uL (ref 150–400)
RBC: 3.9 MIL/uL — ABNORMAL LOW (ref 4.22–5.81)
RBC: 3.23 MIL/uL — ABNORMAL LOW (ref 4.22–5.81)
RBC: 3.41 MIL/uL — ABNORMAL LOW (ref 4.22–5.81)
RDW: 13.4 % (ref 11.5–15.5)
WBC: 18.5 10*3/uL — ABNORMAL HIGH (ref 4.0–10.5)
WBC: 13.2 10*3/uL — ABNORMAL HIGH (ref 4.0–10.5)

## 2012-04-22 LAB — POCT I-STAT, CHEM 8
BUN: 10 mg/dL (ref 6–23)
BUN: 11 mg/dL (ref 6–23)
Calcium, Ion: 1.26 mmol/L (ref 1.13–1.30)
Chloride: 106 mEq/L (ref 96–112)
Creatinine, Ser: 0.8 mg/dL (ref 0.50–1.35)
Glucose, Bld: 184 mg/dL — ABNORMAL HIGH (ref 70–99)
Hemoglobin: 8.8 g/dL — ABNORMAL LOW (ref 13.0–17.0)
Potassium: 4.2 mEq/L (ref 3.5–5.1)
Sodium: 140 mEq/L (ref 135–145)
TCO2: 25 mmol/L (ref 0–100)
TCO2: 24 mmol/L (ref 0–100)

## 2012-04-22 LAB — POCT I-STAT 3, VENOUS BLOOD GAS (G3P V)
Bicarbonate: 22 mEq/L (ref 20.0–24.0)
Patient temperature: 36.2
TCO2: 23 mmol/L (ref 0–100)
pCO2, Ven: 38 mmHg — ABNORMAL LOW (ref 45.0–50.0)
pH, Ven: 7.367 — ABNORMAL HIGH (ref 7.250–7.300)

## 2012-04-22 LAB — CREATININE, SERUM
Creatinine, Ser: 0.82 mg/dL (ref 0.50–1.35)
Creatinine, Ser: 0.87 mg/dL (ref 0.50–1.35)
GFR calc Af Amer: >90 mL/min (ref >90)
GFR calc non Af Amer: >90 mL/min (ref >90)

## 2012-04-22 LAB — GLUCOSE, CAPILLARY: Glucose-Capillary: 94 mg/dL (ref 70–99)

## 2012-04-22 LAB — POCT I-STAT GLUCOSE
Glucose, Bld: 105 mg/dL — ABNORMAL HIGH (ref 70–99)
Operator id: 284731

## 2012-04-22 LAB — PROTIME-INR: INR: 1.28 (ref 0.00–1.49)

## 2012-04-22 LAB — APTT: aPTT: 37 seconds (ref 24–37)

## 2012-04-22 LAB — PLATELET COUNT: Platelets: 208 10*3/uL (ref 150–400)

## 2012-04-22 SURGERY — CORONARY ARTERY BYPASS GRAFTING (CABG)
Anesthesia: General | Site: Chest | Wound class: Clean

## 2012-04-22 MED ORDER — MORPHINE SULFATE 2 MG/ML IJ SOLN
1.0000 mg | INTRAMUSCULAR | Status: DC | PRN
Start: 2012-04-22 — End: 2012-04-23
  Filled 2012-04-22 (×2): qty 1

## 2012-04-22 MED ORDER — ASPIRIN EC 325 MG PO TBEC
325.0000 mg | DELAYED_RELEASE_TABLET | Freq: Every day | ORAL | Status: DC
  Filled 2012-04-22: qty 1

## 2012-04-22 MED ORDER — INSULIN ASPART 100 UNIT/ML ~~LOC~~ SOLN
0.0000 [IU] | SUBCUTANEOUS | Status: AC
  Administered 2012-04-22 (×2): 2 [IU] via SUBCUTANEOUS
  Administered 2012-04-22: 4 [IU] via SUBCUTANEOUS

## 2012-04-22 MED ORDER — MAGNESIUM SULFATE 40 MG/ML IJ SOLN
4.0000 g | Freq: Once | INTRAMUSCULAR | Status: AC
  Administered 2012-04-22: 4 g via INTRAVENOUS
  Filled 2012-04-22: qty 100

## 2012-04-22 MED ORDER — SODIUM CHLORIDE 0.9 % IJ SOLN: 3.0000 mL | INTRAMUSCULAR | Status: DC | PRN

## 2012-04-22 MED ORDER — CHLORHEXIDINE GLUCONATE 4 % EX LIQD: 30.0000 mL | CUTANEOUS | Status: DC

## 2012-04-22 MED ORDER — FENTANYL CITRATE 0.05 MG/ML IJ SOLN
INTRAMUSCULAR | Status: DC | PRN
  Administered 2012-04-22: 100 ug via INTRAVENOUS
  Administered 2012-04-22 (×3): 250 ug via INTRAVENOUS
  Administered 2012-04-22: 150 ug via INTRAVENOUS
  Administered 2012-04-22 (×2): 250 ug via INTRAVENOUS

## 2012-04-22 MED ORDER — HEPARIN SODIUM (PORCINE) 1000 UNIT/ML IJ SOLN
INTRAMUSCULAR | Status: DC | PRN
  Administered 2012-04-22: 5000 [IU] via INTRAVENOUS
  Administered 2012-04-22: 30000 [IU] via INTRAVENOUS

## 2012-04-22 MED ORDER — ALBUMIN HUMAN 5 % IV SOLN
12.5000 g | Freq: Once | INTRAVENOUS | Status: AC
  Administered 2012-04-22: 12.5 g via INTRAVENOUS

## 2012-04-22 MED ORDER — ACETAMINOPHEN 10 MG/ML IV SOLN
1000.0000 mg | Freq: Once | INTRAVENOUS | Status: AC
  Administered 2012-04-22: 1000 mg via INTRAVENOUS
  Filled 2012-04-22: qty 100

## 2012-04-22 MED ORDER — PROTAMINE SULFATE 10 MG/ML IV SOLN
INTRAVENOUS | Status: DC | PRN
  Administered 2012-04-22 (×4): 50 mg via INTRAVENOUS
  Administered 2012-04-22 (×2): 25 mg via INTRAVENOUS
  Administered 2012-04-22: 50 mg via INTRAVENOUS

## 2012-04-22 MED ORDER — BISACODYL 5 MG PO TBEC
10.0000 mg | DELAYED_RELEASE_TABLET | Freq: Every day | ORAL | Status: DC
  Administered 2012-04-23: 10 mg via ORAL
  Filled 2012-04-22: qty 2

## 2012-04-22 MED ORDER — POTASSIUM CHLORIDE 10 MEQ/50ML IV SOLN: 10.0000 meq | INTRAVENOUS | Status: AC

## 2012-04-22 MED ORDER — PROPOFOL 10 MG/ML IV BOLUS
INTRAVENOUS | Status: DC | PRN
  Administered 2012-04-22: 60 mg via INTRAVENOUS

## 2012-04-22 MED ORDER — SODIUM CHLORIDE 0.9 % IV SOLN
INTRAVENOUS | Status: DC | PRN
  Administered 2012-04-22: 12:00:00 via INTRAVENOUS

## 2012-04-22 MED ORDER — SODIUM CHLORIDE 0.45 % IV SOLN
INTRAVENOUS | Status: DC
  Administered 2012-04-22: 14:00:00 via INTRAVENOUS

## 2012-04-22 MED ORDER — DOCUSATE SODIUM 100 MG PO CAPS
200.0000 mg | ORAL_CAPSULE | Freq: Every day | ORAL | Status: DC
  Administered 2012-04-23 – 2012-04-25 (×3): 200 mg via ORAL
  Filled 2012-04-22 (×4): qty 2

## 2012-04-22 MED ORDER — ACETAMINOPHEN 160 MG/5ML PO SOLN
975.0000 mg | Freq: Four times a day (QID) | ORAL | Status: DC
  Filled 2012-04-22: qty 40.6

## 2012-04-22 MED ORDER — SODIUM CHLORIDE 0.9 % IJ SOLN
3.0000 mL | Freq: Two times a day (BID) | INTRAMUSCULAR | Status: DC
  Administered 2012-04-23: 3 mL via INTRAVENOUS

## 2012-04-22 MED ORDER — INSULIN ASPART 100 UNIT/ML ~~LOC~~ SOLN: 0.0000 [IU] | SUBCUTANEOUS | Status: DC

## 2012-04-22 MED ORDER — METOPROLOL TARTRATE 25 MG/10 ML ORAL SUSPENSION
12.5000 mg | Freq: Two times a day (BID) | ORAL | Status: DC
  Administered 2012-04-22: 12.5 mg
  Filled 2012-04-22 (×5): qty 5

## 2012-04-22 MED ORDER — ROCURONIUM BROMIDE 100 MG/10ML IV SOLN
INTRAVENOUS | Status: DC | PRN
  Administered 2012-04-22 (×5): 50 mg via INTRAVENOUS

## 2012-04-22 MED ORDER — OXYCODONE HCL 5 MG PO TABS
5.0000 mg | ORAL_TABLET | ORAL | Status: DC | PRN
  Administered 2012-04-23 (×2): 10 mg via ORAL
  Filled 2012-04-22 (×2): qty 2
  Filled 2012-04-22: qty 1

## 2012-04-22 MED ORDER — CALCIUM CHLORIDE 10 % IV SOLN
1.0000 g | Freq: Once | INTRAVENOUS | Status: AC
  Administered 2012-04-22: 1 g via INTRAVENOUS

## 2012-04-22 MED ORDER — METOPROLOL TARTRATE 1 MG/ML IV SOLN: 2.5000 mg | INTRAVENOUS | Status: DC | PRN

## 2012-04-22 MED ORDER — INSULIN REGULAR BOLUS VIA INFUSION
0.0000 [IU] | Freq: Three times a day (TID) | INTRAVENOUS | Status: DC
  Filled 2012-04-22: qty 10

## 2012-04-22 MED ORDER — DEXTROSE 5 % IV SOLN
1.5000 g | Freq: Two times a day (BID) | INTRAVENOUS | Status: DC
  Administered 2012-04-22 – 2012-04-23 (×2): 1.5 g via INTRAVENOUS
  Filled 2012-04-22 (×4): qty 1.5

## 2012-04-22 MED ORDER — MIDAZOLAM HCL 2 MG/2ML IJ SOLN
2.0000 mg | INTRAMUSCULAR | Status: DC | PRN
  Administered 2012-04-22: 2 mg via INTRAVENOUS
  Filled 2012-04-22: qty 2

## 2012-04-22 MED ORDER — FAMOTIDINE 20 MG PO TABS: 20.0000 mg | ORAL_TABLET | Freq: Two times a day (BID) | ORAL | Status: DC

## 2012-04-22 MED ORDER — 0.9 % SODIUM CHLORIDE (POUR BTL) OPTIME
TOPICAL | Status: DC | PRN
  Administered 2012-04-22: 1000 mL

## 2012-04-22 MED ORDER — MORPHINE SULFATE 2 MG/ML IJ SOLN
2.0000 mg | INTRAMUSCULAR | Status: DC | PRN
  Administered 2012-04-22 – 2012-04-23 (×8): 2 mg via INTRAVENOUS
  Administered 2012-04-23: 4 mg via INTRAVENOUS
  Filled 2012-04-22 (×2): qty 2
  Filled 2012-04-22 (×4): qty 1

## 2012-04-22 MED ORDER — SODIUM CHLORIDE 0.9 % IV SOLN
INTRAVENOUS | Status: DC
  Administered 2012-04-22: 20 mL/h via INTRAVENOUS
  Administered 2012-04-23: 09:00:00 via INTRAVENOUS

## 2012-04-22 MED ORDER — METOCLOPRAMIDE HCL 5 MG/ML IJ SOLN
5.0000 mg | Freq: Four times a day (QID) | INTRAMUSCULAR | Status: AC
  Administered 2012-04-22 – 2012-04-23 (×4): 5 mg via INTRAVENOUS
  Filled 2012-04-22 (×4): qty 1

## 2012-04-22 MED ORDER — ALBUMIN HUMAN 5 % IV SOLN
INTRAVENOUS | Status: AC
  Administered 2012-04-22: 12.5 g
  Filled 2012-04-22: qty 250

## 2012-04-22 MED ORDER — ONDANSETRON HCL 4 MG/2ML IJ SOLN
4.0000 mg | Freq: Four times a day (QID) | INTRAMUSCULAR | Status: DC | PRN
  Administered 2012-04-23 – 2012-04-25 (×3): 4 mg via INTRAVENOUS
  Filled 2012-04-22 (×3): qty 2

## 2012-04-22 MED ORDER — ACETAMINOPHEN 500 MG PO TABS
1000.0000 mg | ORAL_TABLET | Freq: Four times a day (QID) | ORAL | Status: DC
  Administered 2012-04-22 – 2012-04-26 (×11): 1000 mg via ORAL
  Filled 2012-04-22 (×19): qty 2

## 2012-04-22 MED ORDER — ASPIRIN 81 MG PO CHEW: 324.0000 mg | CHEWABLE_TABLET | Freq: Every day | ORAL | Status: DC

## 2012-04-22 MED ORDER — ARTIFICIAL TEARS OP OINT
TOPICAL_OINTMENT | OPHTHALMIC | Status: DC | PRN
  Administered 2012-04-22: 1 via OPHTHALMIC

## 2012-04-22 MED ORDER — LACTATED RINGERS IV SOLN
INTRAVENOUS | Status: DC | PRN
  Administered 2012-04-22 (×3): via INTRAVENOUS

## 2012-04-22 MED ORDER — SODIUM CHLORIDE 0.9 % IV SOLN: INTRAVENOUS | Status: DC

## 2012-04-22 MED ORDER — ALBUMIN HUMAN 5 % IV SOLN
250.0000 mL | INTRAVENOUS | Status: AC | PRN
  Administered 2012-04-22 (×4): 250 mL via INTRAVENOUS
  Filled 2012-04-22: qty 500

## 2012-04-22 MED ORDER — FAMOTIDINE IN NACL 20-0.9 MG/50ML-% IV SOLN
20.0000 mg | Freq: Two times a day (BID) | INTRAVENOUS | Status: DC
  Administered 2012-04-22: 20 mg via INTRAVENOUS

## 2012-04-22 MED ORDER — BISACODYL 10 MG RE SUPP: 10.0000 mg | Freq: Every day | RECTAL | Status: DC

## 2012-04-22 MED ORDER — LACTATED RINGERS IV SOLN: INTRAVENOUS | Status: DC

## 2012-04-22 MED ORDER — SODIUM CHLORIDE 0.9 % IJ SOLN
OROMUCOSAL | Status: DC | PRN
  Administered 2012-04-22: 09:00:00 via TOPICAL

## 2012-04-22 MED ORDER — METOPROLOL TARTRATE 12.5 MG HALF TABLET
12.5000 mg | ORAL_TABLET | Freq: Two times a day (BID) | ORAL | Status: DC
  Administered 2012-04-23 (×2): 12.5 mg via ORAL
  Filled 2012-04-22 (×5): qty 1

## 2012-04-22 MED ORDER — SODIUM CHLORIDE 0.9 % IV SOLN: 250.0000 mL | INTRAVENOUS | Status: DC

## 2012-04-22 MED ORDER — DEXMEDETOMIDINE HCL IN NACL 200 MCG/50ML IV SOLN
0.1000 ug/kg/h | INTRAVENOUS | Status: DC
  Administered 2012-04-22 (×2): 0.7 ug/kg/h via INTRAVENOUS
  Filled 2012-04-22 (×2): qty 50

## 2012-04-22 MED ORDER — HEMOSTATIC AGENTS (NO CHARGE) OPTIME
TOPICAL | Status: DC | PRN
  Administered 2012-04-22: 1 via TOPICAL

## 2012-04-22 MED ORDER — PANTOPRAZOLE SODIUM 40 MG PO TBEC
40.0000 mg | DELAYED_RELEASE_TABLET | Freq: Every day | ORAL | Status: DC
  Administered 2012-04-24: 40 mg via ORAL
  Filled 2012-04-22 (×2): qty 1

## 2012-04-22 MED ORDER — ALBUMIN HUMAN 5 % IV SOLN
INTRAVENOUS | Status: AC
  Filled 2012-04-22: qty 250

## 2012-04-22 MED ORDER — NITROGLYCERIN IN D5W 200-5 MCG/ML-% IV SOLN: 0.0000 ug/min | INTRAVENOUS | Status: DC

## 2012-04-22 MED ORDER — VANCOMYCIN HCL IN DEXTROSE 1-5 GM/200ML-% IV SOLN
1000.0000 mg | Freq: Once | INTRAVENOUS | Status: AC
  Administered 2012-04-22: 1000 mg via INTRAVENOUS
  Filled 2012-04-22: qty 200

## 2012-04-22 MED ORDER — LACTATED RINGERS IV SOLN
500.0000 mL | Freq: Once | INTRAVENOUS | Status: AC | PRN
  Administered 2012-04-22: 500 mL via INTRAVENOUS

## 2012-04-22 MED ORDER — PHENYLEPHRINE HCL 10 MG/ML IJ SOLN
0.0000 ug/min | INTRAVENOUS | Status: DC
  Administered 2012-04-23: 20 ug/min via INTRAVENOUS
  Filled 2012-04-22: qty 2

## 2012-04-22 MED ORDER — MIDAZOLAM HCL 5 MG/5ML IJ SOLN
INTRAMUSCULAR | Status: DC | PRN
  Administered 2012-04-22: 5 mg via INTRAVENOUS
  Administered 2012-04-22: 2 mg via INTRAVENOUS
  Administered 2012-04-22: 4 mg via INTRAVENOUS
  Administered 2012-04-22: 2 mg via INTRAVENOUS
  Administered 2012-04-22: 4 mg via INTRAVENOUS
  Administered 2012-04-22: 3 mg via INTRAVENOUS

## 2012-04-22 SURGICAL SUPPLY — 79 items
ATTRACTOMAT 16X20 MAGNETIC DRP (DRAPES) ×3 IMPLANT
BAG DECANTER FOR FLEXI CONT (MISCELLANEOUS) ×3 IMPLANT
BANDAGE ELASTIC 4 VELCRO ST LF (GAUZE/BANDAGES/DRESSINGS) IMPLANT
BANDAGE ELASTIC 6 VELCRO ST LF (GAUZE/BANDAGES/DRESSINGS) IMPLANT
BANDAGE GAUZE ELAST BULKY 4 IN (GAUZE/BANDAGES/DRESSINGS) IMPLANT
BASKET HEART (ORDER IN 25'S) (MISCELLANEOUS) ×1
BASKET HEART (ORDER IN 25S) (MISCELLANEOUS) ×2 IMPLANT
BLADE STERNUM SYSTEM 6 (BLADE) ×3 IMPLANT
CANISTER SUCTION 2500CC (MISCELLANEOUS) ×3 IMPLANT
CATH CPB KIT HENDRICKSON (MISCELLANEOUS) ×3 IMPLANT
CATH ROBINSON RED A/P 18FR (CATHETERS) ×3 IMPLANT
CATH THORACIC 36FR (CATHETERS) IMPLANT
CATH THORACIC 36FR RT ANG (CATHETERS) ×3 IMPLANT
CLIP TI MEDIUM 24 (CLIP) IMPLANT
CLIP TI WIDE RED SMALL 24 (CLIP) ×6 IMPLANT
CLOTH BEACON ORANGE TIMEOUT ST (SAFETY) ×3 IMPLANT
COVER SURGICAL LIGHT HANDLE (MISCELLANEOUS) ×3 IMPLANT
CRADLE DONUT ADULT HEAD (MISCELLANEOUS) ×3 IMPLANT
DRAPE CARDIOVASCULAR INCISE (DRAPES) ×1
DRAPE SLUSH/WARMER DISC (DRAPES) ×3 IMPLANT
DRAPE SRG 135X102X78XABS (DRAPES) ×2 IMPLANT
DRSG COVADERM 4X14 (GAUZE/BANDAGES/DRESSINGS) ×3 IMPLANT
ELECT REM PT RETURN 9FT ADLT (ELECTROSURGICAL) ×6
ELECTRODE REM PT RTRN 9FT ADLT (ELECTROSURGICAL) ×4 IMPLANT
GLOVE EUDERMIC 7 POWDERFREE (GLOVE) ×9 IMPLANT
GOWN PREVENTION PLUS XLARGE (GOWN DISPOSABLE) ×6 IMPLANT
GOWN STRL NON-REIN LRG LVL3 (GOWN DISPOSABLE) ×12 IMPLANT
HEMOSTAT POWDER SURGIFOAM 1G (HEMOSTASIS) ×9 IMPLANT
HEMOSTAT SURGICEL 2X14 (HEMOSTASIS) ×3 IMPLANT
INSERT FOGARTY XLG (MISCELLANEOUS) IMPLANT
KIT BASIN OR (CUSTOM PROCEDURE TRAY) ×3 IMPLANT
KIT ROOM TURNOVER OR (KITS) ×3 IMPLANT
KIT SUCTION CATH 14FR (SUCTIONS) ×6 IMPLANT
KIT VASOVIEW W/TROCAR VH 2000 (KITS) IMPLANT
MARKER GRAFT CORONARY BYPASS (MISCELLANEOUS) ×9 IMPLANT
NS IRRIG 1000ML POUR BTL (IV SOLUTION) ×15 IMPLANT
PACK OPEN HEART (CUSTOM PROCEDURE TRAY) ×3 IMPLANT
PAD ARMBOARD 7.5X6 YLW CONV (MISCELLANEOUS) ×6 IMPLANT
PENCIL BUTTON HOLSTER BLD 10FT (ELECTRODE) IMPLANT
PUNCH AORTIC ROTATE 4.0MM (MISCELLANEOUS) ×3 IMPLANT
PUNCH AORTIC ROTATE 4.5MM 8IN (MISCELLANEOUS) IMPLANT
PUNCH AORTIC ROTATE 5MM 8IN (MISCELLANEOUS) IMPLANT
SET CARDIOPLEGIA MPS 5001102 (MISCELLANEOUS) ×3 IMPLANT
SPONGE GAUZE 4X4 12PLY (GAUZE/BANDAGES/DRESSINGS) ×3 IMPLANT
SPONGE LAP 4X18 X RAY DECT (DISPOSABLE) ×3 IMPLANT
SUT BONE WAX W31G (SUTURE) ×3 IMPLANT
SUT MNCRL AB 4-0 PS2 18 (SUTURE) IMPLANT
SUT PROLENE 3 0 SH DA (SUTURE) ×3 IMPLANT
SUT PROLENE 4 0 RB 1 (SUTURE)
SUT PROLENE 4 0 SH DA (SUTURE) IMPLANT
SUT PROLENE 4-0 RB1 .5 CRCL 36 (SUTURE) IMPLANT
SUT PROLENE 6 0 C 1 30 (SUTURE) ×6 IMPLANT
SUT PROLENE 7 0 BV1 MDA (SUTURE) ×3 IMPLANT
SUT PROLENE 8 0 BV175 6 (SUTURE) ×6 IMPLANT
SUT SILK  1 MH (SUTURE) ×1
SUT SILK 1 MH (SUTURE) ×2 IMPLANT
SUT STEEL 6MS V (SUTURE) IMPLANT
SUT STEEL STERNAL CCS#1 18IN (SUTURE) IMPLANT
SUT STEEL SZ 6 DBL 3X14 BALL (SUTURE) IMPLANT
SUT VIC AB 1 CTX 36 (SUTURE) ×2
SUT VIC AB 1 CTX36XBRD ANBCTR (SUTURE) ×4 IMPLANT
SUT VIC AB 2-0 CT1 27 (SUTURE)
SUT VIC AB 2-0 CT1 TAPERPNT 27 (SUTURE) IMPLANT
SUT VIC AB 2-0 CTX 27 (SUTURE) IMPLANT
SUT VIC AB 3-0 SH 27 (SUTURE)
SUT VIC AB 3-0 SH 27X BRD (SUTURE) IMPLANT
SUT VIC AB 3-0 X1 27 (SUTURE) IMPLANT
SUT VICRYL 4-0 PS2 18IN ABS (SUTURE) IMPLANT
SUTURE E-PAK OPEN HEART (SUTURE) ×3 IMPLANT
SYSTEM SAHARA CHEST DRAIN ATS (WOUND CARE) ×3 IMPLANT
TAPE CLOTH SURG 4X10 WHT LF (GAUZE/BANDAGES/DRESSINGS) ×3 IMPLANT
TAPE PAPER 3X10 WHT MICROPORE (GAUZE/BANDAGES/DRESSINGS) ×3 IMPLANT
TOWEL OR 17X24 6PK STRL BLUE (TOWEL DISPOSABLE) ×6 IMPLANT
TOWEL OR 17X26 10 PK STRL BLUE (TOWEL DISPOSABLE) ×6 IMPLANT
TRAY FOLEY IC TEMP SENS 14FR (CATHETERS) ×3 IMPLANT
TUBE FEEDING 8FR 16IN STR KANG (MISCELLANEOUS) IMPLANT
TUBING INSUFFLATION 10FT LAP (TUBING) IMPLANT
UNDERPAD 30X30 INCONTINENT (UNDERPADS AND DIAPERS) ×3 IMPLANT
WATER STERILE IRR 1000ML POUR (IV SOLUTION) ×6 IMPLANT

## 2012-04-22 NOTE — Preoperative (Signed)
Beta Blockers   Reason not to administer Beta Blockers:Not Applicable 

## 2012-04-22 NOTE — Anesthesia Preprocedure Evaluation (Addendum)
Anesthesia Evaluation  Patient identified by MRN, date of birth, ID band Patient awake    Reviewed: Allergy & Precautions, H&P , NPO status , Patient's Chart, lab work & pertinent test results, reviewed documented beta blocker date and time   Airway Mallampati: I TM Distance: >3 FB Neck ROM: Full    Dental  (+) Edentulous Upper, Edentulous Lower and Dental Advisory Given   Pulmonary          Cardiovascular hypertension, Pt. on home beta blockers + CAD and + Peripheral Vascular Disease Rhythm:Regular Rate:Normal     Neuro/Psych  Neuromuscular disease    GI/Hepatic GERD-  Medicated and Controlled,  Endo/Other    Renal/GU      Musculoskeletal   Abdominal   Peds  Hematology   Anesthesia Other Findings   Reproductive/Obstetrics                          Anesthesia Physical Anesthesia Plan  ASA: III  Anesthesia Plan: General   Post-op Pain Management:    Induction: Intravenous  Airway Management Planned: Oral ETT  Additional Equipment: Arterial line, CVP, PA Cath, TEE and Ultrasound Guidance Line Placement  Intra-op Plan:   Post-operative Plan: Post-operative intubation/ventilation  Informed Consent:   Plan Discussed with:   Anesthesia Plan Comments:         Anesthesia Quick Evaluation

## 2012-04-22 NOTE — Progress Notes (Signed)
[facility] BRIEF SICU PROGRESS NOTE  Day of Surgery  S/P Procedure(s) (LRB): CORONARY ARTERY BYPASS GRAFTING (CABG) (N/A) TRANSESOPHAGEAL ECHOCARDIOGRAM (TEE) (N/A)   Sedated on vent Stable hemodynamics Chest tube output < 100 mL/hr UOP adequate  Plan: Continue routine early postop  Angelyne Terwilliger H 04/22/2012 6:12 PM

## 2012-04-22 NOTE — Progress Notes (Signed)
  Echocardiogram Echocardiogram Transesophageal has been performed.  Ellender Hose A 04/22/2012, 8:45 AM

## 2012-04-22 NOTE — Anesthesia Postprocedure Evaluation (Signed)
  Anesthesia Post-op Note  Patient: Eric Dickerson  Procedure(s) Performed: Procedure(s) with comments: CORONARY ARTERY BYPASS GRAFTING (CABG) (N/A) - CABG X 2, BIMA, POSSIBLE EVH TRANSESOPHAGEAL ECHOCARDIOGRAM (TEE) (N/A)  Patient Location: SICU  Anesthesia Type:General  Level of Consciousness: Patient remains intubated per anesthesia plan  Airway and Oxygen Therapy: Patient remains intubated per anesthesia plan and Patient placed on Ventilator (see vital sign flow sheet for setting)  Post-op Pain: none  Post-op Assessment: Post-op Vital signs reviewed, Patient's Cardiovascular Status Stable and Respiratory Function Stable  Post-op Vital Signs: Reviewed and stable  Complications: No apparent anesthesia complications

## 2012-04-22 NOTE — CV Procedure (Signed)
Intraoperative Transesophageal Echocardiography Report:   Mr. Jeremian Whitby is a 62 year old man with a history of coronary artery disease with stenting of the ramus intermedius and right coronary in 2009. He recently developed exertional chest pain. Cardiac catheterization revealed ostial lesions of the LAD and left circumflex arteries. He is now scheduled to undergo coronary artery bypass grafting by Dr. Dorris Fetch. Intraoperative transesophageal echocardiography was requested to evaluate the right and left ventricular function, to assess for any valvular pathology, and to serve as a monitor for intraoperative volume status.  The patient was brought to the operating room at [facility] and general anesthesia was induced without difficulty. Following endotracheal intubation and orogastric orogastric suctioning, the transesophageal echocardiography probe was inserted into the esophagus without difficulty.  Impression: Pre-bypass findings:  1. Aortic valve: There was mild thickening of the aortic valve leaflets. The leaflets opened normally and there was no aortic insufficiency  2. Mitral valve: The mitral leaflets opened normally and coapted well. There was trace mitral insufficiency. There were no flail or prolapsing segments noted.  3. Left ventricle: There appeared to be normal left ventricular systolic function. There were no regional wall motion abnormalities appreciated and the ejection fraction was estimated at 55-60%. Left ventricular end-diastolic diameter measured 42 mm. Left ventricular wall thickness measured 0.79 cm of the posterior wall and 0.74 cm of the anterior wall at end diastole at the mid-papillary level in the short axis view. There was no thrombus noted in the left ventricular apex  4. Right ventricle: There was normal appearing right ventricular size and normal contractility of the right ventricular free wall.  5. Tricuspid valve: The tricuspid valve appeared  structurally normal and there was trace tricuspid insufficiency.  6. Interatrial septum:  The interatrial septum was intact without evidence of patent foramen ovale or atrial septal defect by color Doppler or bubble study.  7. Left atrium: There was no thrombus noted in the left atrium or left atrial appendage.  8. Ascending aorta: There was no significant atheromatous disease appreciated in the ascending aorta. Maximal diameter of the ascending aorta was 3.47 cm.  9. Descending aorta: The descending aorta appeared free of atheromatous disease and measured 2.14 cm. in diameter.  Post-bypass findings:  1. Aortic valve: The aortic valve appeared unchanged from the pre-bypass study. There was no aortic insufficiency and the leaflets opened normally  2. Mitral valve: The mitral valve appeared unchanged from the pre-bypass study. There was trace mitral insufficiency with good coaptation of the leaflets.  3. Left ventricle: There appeared to be good contractility in all segments interrogated and the ejection fraction was estimated at 55-60%.  4. Right ventricle: There right ventricle appeared normal in size with good contractility of the right ventricular free wall  5. Tricuspid valve: The tricuspid valve was structurally intact with trace tricuspid insufficiency.

## 2012-04-22 NOTE — Transfer of Care (Signed)
Immediate Anesthesia Transfer of Care Note  Patient: Eric Dickerson  Procedure(s) Performed: Procedure(s) with comments: CORONARY ARTERY BYPASS GRAFTING (CABG) (N/A) - CABG X 2, BIMA, POSSIBLE EVH TRANSESOPHAGEAL ECHOCARDIOGRAM (TEE) (N/A)  Patient Location: SICU  Anesthesia Type:General  Level of Consciousness: Patient remains intubated per anesthesia plan  Airway & Oxygen Therapy: Patient remains intubated per anesthesia plan and Patient placed on Ventilator (see vital sign flow sheet for setting)  Post-op Assessment: Report given to PACU RN  Post vital signs: Reviewed and stable  Complications: No apparent anesthesia complications

## 2012-04-22 NOTE — Procedures (Signed)
Extubation Procedure Note  Patient Details:   Name: Eric Dickerson DOB: 17-Jul-1950 MRN: [id number]   Airway Documentation:     Evaluation  O2 sats: stable throughout  Complications: No apparent complications Patient did  tolerate procedure well. Bilateral Breath Sounds: Clear   Yes Extubated PT to a 3L [place]. PT was able to tell me his name. SP02 was 100%  Eric Dickerson, Eric Dickerson 04/22/2012, 8:02 PM

## 2012-04-22 NOTE — H&P (View-Only) (Signed)
PCP is Marga Melnick, MD Referring Kirstan Fentress is [name], MD  Chief Complaint  Patient presents with  . Coronary Artery Disease    Cardiac Cath and Dopplers per CABG  04/15/12     HPI: 62 year old gentleman presents with chief complaint of exertional chest pain.  [name] is a 62 year old gentleman with a history of coronary disease. He previously had PTCA and stenting of the ramus intermedius and right coronary in 2009. Recently he began having exertional chest pain. He describes this as a burning sensation it only occurs when he is walking up an incline or stairs. This is relieved by rest. He has not had any rest or nocturnal symptoms. The symptoms were similar to the ones that he had prior to his angioplasty in 2009.  He saw Dr. Eden Emms recommended cardiac catheterization. Catheterization revealed that the previous stents were patent. However he has new tight ostial lesions of both the LAD and circumflex. These are not amenable to angioplasty.   Past Medical History  Diagnosis Date  . CAD (coronary artery disease)     a. CAD,  b. s/p Cypher DES to the ramus and Promus DES x 2 to the RCA 08/2007 (minimal disease in LAD and CFX at that time), c. Myoview 8/10: EF 63%, inf thinning, small prior ant infarct, no ischemia  . Other abnormal glucose   . Hyperlipidemia     statin intolerant  . HTN (hypertension)   . ERECTILE DYSFUNCTION   . GERD   . HIATAL HERNIA   . DIVERTICULOSIS, COLON   . HYPERPLASIA PROSTATE UNS W/O UR OBST & OTH LUTS   . DEGENERATIVE JOINT DISEASE, RIGHT HIP   . SPINAL STENOSIS, LUMBAR   . LUMBAR RADICULOPATHY, RIGHT   . ANEMIA, MILD     Past Surgical History  Procedure Laterality Date  . Coronary stent placement  2010     X 2  . Total hip arthroplasty  2009  . Colonoscopy  2002    negative  . Coronary angioplasty with stent placement Bilateral 2009    Stents to Intermediate and RCA    Family History  Problem Relation Age of Onset  . Diabetes  Maternal Grandmother   . Hypertension Father   . Heart attack Father 35    smoker  . Stroke Brother     2 brothers ; 70 & 36  . Hyperlipidemia Brother   . Pulmonary embolism Brother   . Hyperlipidemia Mother     Social History History  Substance Use Topics  . Smoking status: Former Smoker    Quit date: 02/06/1985  . Smokeless tobacco: Not on file  . Alcohol Use: No    Current Outpatient Prescriptions  Medication Sig Dispense Refill  . amLODipine (NORVASC) 10 MG tablet Take 1 tablet (10 mg total) by mouth daily.  30 tablet  11  . aspirin 81 MG EC tablet Take 1 tablet (81 mg total) by mouth daily. Swallow whole.      . famotidine (PEPCID) 20 MG tablet Take 1 tablet (20 mg total) by mouth 2 (two) times daily.  60 tablet  3  . metoprolol tartrate (LOPRESSOR) 25 MG tablet Take 2 tablets (50 mg total) by mouth 2 (two) times daily.  60 tablet  11  . Multiple Vitamins-Minerals (CENTRUM SILVER PO) Take by mouth daily.      . nitroGLYCERIN (NITROSTAT) 0.4 MG SL tablet Place 1 tablet (0.4 mg total) under the tongue every 5 (five) minutes as needed for chest  pain.  25 tablet  3   No current facility-administered medications for this visit.    Allergies  Allergen Reactions  . Olmesartan Medoxomil     REACTION: rash  . Quinapril Hcl     REACTION: cough; ACE inhibitors are contraindicated because of a history of rash with dimensions of receptor blocker.  . Glucosamine     REACTION: Reaction not known  . Rosuvastatin     REACTION: muscle cramps  . Verapamil     Review of Systems  Constitutional: Positive for fatigue.  Respiratory: Negative.   Cardiovascular: Positive for chest pain. Negative for leg swelling.       Mild claudication- stable  Genitourinary: Positive for frequency.       Erectile dysfunction  Neurological: Negative.   Hematological: Negative.   All other systems reviewed and are negative.    BP 136/88  Pulse 85  Resp 20  Ht 6\' 2"  (1.88 m)  Wt 205 lb  (92.987 kg)  BMI 26.31 kg/m2  SpO2 98% Physical Exam  Vitals reviewed. Constitutional: He is oriented to person, place, and time. He appears well-developed and well-nourished. No distress.  HENT:  Head: Normocephalic and atraumatic.  Eyes: EOM are normal. Pupils are equal, round, and reactive to light.  Neck: Neck supple. No thyromegaly present.  No bruits  Cardiovascular: Normal rate, regular rhythm and normal heart sounds.  Exam reveals no gallop and no friction rub.   No murmur heard. Unable to palpate DP or PT bilaterally  Pulmonary/Chest: Effort normal and breath sounds normal. He has no wheezes. He has no rales.  Abdominal: Soft. There is no tenderness.  Musculoskeletal: He exhibits no edema.  Lymphadenopathy:    He has no cervical adenopathy.  Neurological: He is alert and oriented to person, place, and time. No cranial nerve deficit.  Skin: Skin is warm and dry.  Psychiatric: He has a normal mood and affect.     Diagnostic Tests: CARDIAC CATHETERIZATION Coronary Arteries:  Right dominant with no anomalies  LM: 20% distal   LAD: 80% ostial 40% multiple discrete lesions in mid vessel  IM: patent stent  D1: normal  Circumflex: 95% ostial disease  OM1: Normal  RCA: Proximal and mid stents widely patent  PDA: 40% at crux  PLA: small  Ventriculography: EF: 55 %, No discrete RWMA;s  Hemodynamics:  Aortic Pressure: 140 80 mmHg LV Pressure: 144 10 mmHg  Impression: Unfortunately has developed ostial disease in LAD and circumflex. This is not manageable with repeat stenting. Has no rest symptoms. Will refer to CVTS for CABG  Tolerated procedure well with no complications.  Eric Dickerson  04/15/2012  9:00 AM   Impression: 62 year old with multiple cardiac risk factors and known coronary disease who presents with recent onset exertional angina. A catheterization he has tight ostial lesions of both the LAD and circumflex which are not amenable to percutaneous intervention.  Coronary bypass grafting is indicated for survival benefit as well as relief of symptoms.  He would benefit from bilateral IMA grafting given his young age.  I discussed with him the general nature of the procedure, need for general anesthesia ,and  incisions to be used. We will plan to use bilateral mammary arteries, and only need to harvest saphenous vein if there is a technical problem with one of the mammaries. I discussed the expected hospital stay, overall recovery and short and long term outcomes. He understands the risks include, but are not limited to, death, stroke, MI, DVT/PE, bleeding, possible  need for transfusion, infections, cardiac arrhythmias, and other organ system dysfunction including respiratory, renal, or GI complications.  He does understand this is a palliative and not a curative procedure, and that he may need further cardiac interventions in the future. He They understands and accepts the risks and agree to proceed.   Plan: CABG with bilateral IMA grafting on Monday 04/22/12

## 2012-04-22 NOTE — Progress Notes (Signed)
Utilization review completed.  P.J. Antavious Spanos,RN,BSN Case Manager [phone]  

## 2012-04-22 NOTE — Anesthesia Postprocedure Evaluation (Signed)
  Anesthesia Post-op Note  Patient: Eric Dickerson  Procedure(s) Performed: Procedure(s) with comments: CORONARY ARTERY BYPASS GRAFTING (CABG) (N/A) - CABG X 2, BIMA, POSSIBLE EVH TRANSESOPHAGEAL ECHOCARDIOGRAM (TEE) (N/A)  Patient Location: SICU  Anesthesia Type:General  Level of Consciousness: sedated and Patient remains intubated per anesthesia plan  Airway and Oxygen Therapy: Patient remains intubated per anesthesia plan and Patient placed on Ventilator (see vital sign flow sheet for setting)  Post-op Pain: none  Post-op Assessment: Post-op Vital signs reviewed and Patient's Cardiovascular Status Stable  Post-op Vital Signs: stable  Complications: No apparent anesthesia complications

## 2012-04-22 NOTE — Interval H&P Note (Signed)
History and Physical Interval Note:  04/22/2012 7:33 AM  Eric Dickerson  has presented today for surgery, with the diagnosis of CAD  The various methods of treatment have been discussed with the patient and family. After consideration of risks, benefits and other options for treatment, the patient has consented to  Procedure(s) with comments: CORONARY ARTERY BYPASS GRAFTING (CABG) (N/A) - CABG X 2, BIMA, POSSIBLE EVH as a surgical intervention .  The patient's history has been reviewed, patient examined, no change in status, stable for surgery.  I have reviewed the patient's chart and labs.  Questions were answered to the patient's satisfaction.     Marda Breidenbach C

## 2012-04-22 NOTE — Brief Op Note (Addendum)
04/22/2012  11:55 AM  PATIENT:  Eric Dickerson  62 y.o. male  PRE-OPERATIVE DIAGNOSIS:  CAD  POST-OPERATIVE DIAGNOSIS:  Coronary Artery Disease  PROCEDURE:  Procedure(s) with comments: CORONARY ARTERY BYPASS GRAFTING x2 -LIMA to LAD -FREE RIMA to OM1  TRANSESOPHAGEAL ECHOCARDIOGRAM (TEE) (N/A)  SURGEON:  Surgeon(s) and Role:    * Loreli Slot, MD - Primary  PHYSICIAN ASSISTANT: Erin Barrett PA-C  ANESTHESIA:   general  EBL:  Total I/O In: -  Out: 450 [Urine:450]  BLOOD ADMINISTERED: CELLSAVER  DRAINS: Chest tubes in right and left pleural space, Mediastinal chest drains   LOCAL MEDICATIONS USED:  NONE   COUNTS:  YES  PLAN OF CARE: Admit to inpatient   PATIENT DISPOSITION:  ICU - intubated and hemodynamically stable.   Delay start of Pharmacological VTE agent (>24hrs) due to surgical blood loss or risk of bleeding: yes  XC= 50 min CPB= 98 min

## 2012-04-22 NOTE — Anesthesia Procedure Notes (Addendum)
Procedure Name: Intubation Date/Time: 04/22/2012 7:55 AM Performed by: De Nurse Pre-anesthesia Checklist: Patient identified, Emergency Drugs available, Timeout performed, Suction available and Patient being monitored Patient Re-evaluated:Patient Re-evaluated prior to inductionOxygen Delivery Method: Circle system utilized Preoxygenation: Pre-oxygenation with 100% oxygen Intubation Type: IV induction Ventilation: Oral airway inserted - appropriate to patient size and Mask ventilation without difficulty Laryngoscope Size: Mac and 3 Grade View: Grade I Tube type: Oral Tube size: 8.5 mm Number of attempts: 1 Airway Equipment and Method: Stylet Placement Confirmation: ETT inserted through vocal cords under direct vision,  breath sounds checked- equal and bilateral and positive ETCO2 Secured at: 23 cm Tube secured with: Tape Dental Injury: Teeth and Oropharynx as per pre-operative assessment

## 2012-04-23 ENCOUNTER — Encounter (HOSPITAL_COMMUNITY): Payer: Self-pay | Admitting: Thoracic Surgery (Cardiothoracic Vascular Surgery)

## 2012-04-23 ENCOUNTER — Inpatient Hospital Stay (HOSPITAL_COMMUNITY): Payer: BC Managed Care – PPO

## 2012-04-23 LAB — BASIC METABOLIC PANEL
CO2: 24 mEq/L (ref 19–32)
Calcium: 8.4 mg/dL (ref 8.4–10.5)
Chloride: 106 mEq/L (ref 96–112)
Glucose, Bld: 114 mg/dL — ABNORMAL HIGH (ref 70–99)
Potassium: 4.2 mEq/L (ref 3.5–5.1)
Sodium: 138 mEq/L (ref 135–145)

## 2012-04-23 LAB — GLUCOSE, CAPILLARY: Glucose-Capillary: 97 mg/dL (ref 70–99)

## 2012-04-23 LAB — CBC
Hemoglobin: 8.4 g/dL — ABNORMAL LOW (ref 13.0–17.0)
Platelets: 179 10*3/uL (ref 150–400)
RBC: 2.94 MIL/uL — ABNORMAL LOW (ref 4.22–5.81)
WBC: 15 10*3/uL — ABNORMAL HIGH (ref 4.0–10.5)

## 2012-04-23 LAB — MAGNESIUM: Magnesium: 2.4 mg/dL (ref 1.5–2.5)

## 2012-04-23 MED ORDER — ZOLPIDEM TARTRATE 5 MG PO TABS
5.0000 mg | ORAL_TABLET | Freq: Every evening | ORAL | Status: DC | PRN
  Administered 2012-04-24 – 2012-04-25 (×2): 5 mg via ORAL
  Filled 2012-04-23 (×2): qty 1

## 2012-04-23 MED ORDER — ALBUMIN HUMAN 5 % IV SOLN
INTRAVENOUS | Status: AC
  Administered 2012-04-23: 01:00:00
  Filled 2012-04-23: qty 250

## 2012-04-23 MED ORDER — INSULIN ASPART 100 UNIT/ML ~~LOC~~ SOLN: 0.0000 [IU] | SUBCUTANEOUS | Status: DC

## 2012-04-23 MED ORDER — ALUM & MAG HYDROXIDE-SIMETH 200-200-20 MG/5ML PO SUSP: 15.0000 mL | ORAL | Status: DC | PRN

## 2012-04-23 MED ORDER — MOVING RIGHT ALONG BOOK
Freq: Once | Status: AC
  Administered 2012-04-24: 08:00:00
  Filled 2012-04-23 (×2): qty 1

## 2012-04-23 MED ORDER — SODIUM CHLORIDE 0.9 % IJ SOLN: 3.0000 mL | INTRAMUSCULAR | Status: DC | PRN

## 2012-04-23 MED ORDER — INSULIN DETEMIR 100 UNIT/ML ~~LOC~~ SOLN
15.0000 [IU] | Freq: Every day | SUBCUTANEOUS | Status: DC
  Administered 2012-04-23 – 2012-04-24 (×2): 15 [IU] via SUBCUTANEOUS
  Filled 2012-04-23 (×2): qty 0.15
  Filled 2012-04-23: qty 10

## 2012-04-23 MED ORDER — ENOXAPARIN SODIUM 40 MG/0.4ML ~~LOC~~ SOLN
40.0000 mg | Freq: Every day | SUBCUTANEOUS | Status: DC
  Administered 2012-04-23 – 2012-04-25 (×3): 40 mg via SUBCUTANEOUS
  Filled 2012-04-23 (×4): qty 0.4

## 2012-04-23 MED ORDER — ASPIRIN EC 81 MG PO TBEC
81.0000 mg | DELAYED_RELEASE_TABLET | Freq: Every day | ORAL | Status: DC
  Administered 2012-04-23 – 2012-04-26 (×4): 81 mg via ORAL
  Filled 2012-04-23 (×4): qty 1

## 2012-04-23 MED ORDER — ALPRAZOLAM 0.25 MG PO TABS
0.2500 mg | ORAL_TABLET | Freq: Four times a day (QID) | ORAL | Status: DC | PRN
  Administered 2012-04-23 – 2012-04-24 (×2): 0.25 mg via ORAL
  Filled 2012-04-23 (×2): qty 1

## 2012-04-23 MED ORDER — MAGNESIUM HYDROXIDE 400 MG/5ML PO SUSP: 30.0000 mL | Freq: Every day | ORAL | Status: DC | PRN

## 2012-04-23 MED ORDER — ASPIRIN EC 81 MG PO TBEC
81.0000 mg | DELAYED_RELEASE_TABLET | Freq: Every day | ORAL | Status: DC
  Filled 2012-04-23: qty 1

## 2012-04-23 MED ORDER — CLOPIDOGREL BISULFATE 75 MG PO TABS
75.0000 mg | ORAL_TABLET | Freq: Every day | ORAL | Status: DC
  Administered 2012-04-23 – 2012-04-26 (×4): 75 mg via ORAL
  Filled 2012-04-23 (×4): qty 1

## 2012-04-23 MED ORDER — SODIUM CHLORIDE 0.9 % IV SOLN: 250.0000 mL | INTRAVENOUS | Status: DC | PRN

## 2012-04-23 MED ORDER — GUAIFENESIN-DM 100-10 MG/5ML PO SYRP
15.0000 mL | ORAL_SOLUTION | ORAL | Status: DC | PRN
  Administered 2012-04-24 – 2012-04-25 (×2): 15 mL via ORAL
  Filled 2012-04-23 (×2): qty 15

## 2012-04-23 MED ORDER — SODIUM CHLORIDE 0.9 % IJ SOLN
3.0000 mL | Freq: Two times a day (BID) | INTRAMUSCULAR | Status: DC
  Administered 2012-04-23 – 2012-04-25 (×5): 3 mL via INTRAVENOUS

## 2012-04-23 MED ORDER — ALBUMIN HUMAN 5 % IV SOLN
12.5000 g | Freq: Once | INTRAVENOUS | Status: AC
  Administered 2012-04-23: 12.5 g via INTRAVENOUS
  Filled 2012-04-23: qty 250

## 2012-04-23 MED ORDER — INSULIN ASPART 100 UNIT/ML ~~LOC~~ SOLN
0.0000 [IU] | Freq: Three times a day (TID) | SUBCUTANEOUS | Status: DC
  Administered 2012-04-23 (×2): 2 [IU] via SUBCUTANEOUS

## 2012-04-23 MED FILL — Magnesium Sulfate Inj 50%: INTRAMUSCULAR | Qty: 10 | Status: AC

## 2012-04-23 MED FILL — Sodium Chloride Irrigation Soln 0.9%: Qty: 3000 | Status: AC

## 2012-04-23 MED FILL — Electrolyte-R (PH 7.4) Solution: INTRAVENOUS | Qty: 3000 | Status: AC

## 2012-04-23 MED FILL — Mannitol IV Soln 20%: INTRAVENOUS | Qty: 500 | Status: AC

## 2012-04-23 MED FILL — Heparin Sodium (Porcine) Inj 1000 Unit/ML: INTRAMUSCULAR | Qty: 10 | Status: AC

## 2012-04-23 MED FILL — Potassium Chloride Inj 2 mEq/ML: INTRAVENOUS | Qty: 40 | Status: AC

## 2012-04-23 MED FILL — Lidocaine HCl IV Inj 20 MG/ML: INTRAVENOUS | Qty: 5 | Status: AC

## 2012-04-23 MED FILL — Sodium Chloride IV Soln 0.9%: INTRAVENOUS | Qty: 1000 | Status: AC

## 2012-04-23 MED FILL — Heparin Sodium (Porcine) Inj 1000 Unit/ML: INTRAMUSCULAR | Qty: 30 | Status: AC

## 2012-04-23 MED FILL — Sodium Bicarbonate IV Soln 8.4%: INTRAVENOUS | Qty: 50 | Status: AC

## 2012-04-23 NOTE — Progress Notes (Signed)
Pt complained of feeling like he had a fever. Temp is 100.1. Encouraged incentive spirometer. Tylenol recently administered per orders for scheduled dose.

## 2012-04-23 NOTE — Progress Notes (Signed)
1 Day Post-Op Procedure(s) (LRB): CORONARY ARTERY BYPASS GRAFTING (CABG) (N/A) TRANSESOPHAGEAL ECHOCARDIOGRAM (TEE) (N/A) Subjective: Feels well Denies pain and nausea  Objective: Vital signs in last 24 hours: Temp:  [96.6 F (35.9 C)-101.1 F (38.4 C)] 100 F (37.8 C) (03/18 0700) Pulse Rate:  [78-112] 98 (03/18 0700) Cardiac Rhythm:  [-] Sinus tachycardia (03/18 0400) Resp:  [11-34] 22 (03/18 0700) BP: (93-117)/(60-86) 106/63 mmHg (03/18 0700) SpO2:  [90 %-100 %] 100 % (03/18 0700) Arterial Line BP: (80-128)/(56-84) 107/58 mmHg (03/18 0700) FiO2 (%):  [40 %-50 %] 40 % (03/17 1854) Weight:  [208 lb 15.9 oz (94.8 kg)] 208 lb 15.9 oz (94.8 kg) (03/18 0600)  Hemodynamic parameters for last 24 hours: PAP: (21-40)/(11-22) 31/14 mmHg CO:  [3 L/min-5.6 L/min] 5.6 L/min CI:  [1.4 L/min/m2-2.6 L/min/m2] 2.6 L/min/m2  Intake/Output from previous day: 03/17 0701 - 03/18 0700 In: 6331.8 [P.O.:60; I.V.:4306.8; Blood:315; IV Piggyback:1650] Out: 4460 [Urine:2765; Blood:675; Chest Tube:1020] Intake/Output this shift:    General appearance: alert and no distress Neurologic: intact Heart: RRR, + rub Lungs: diminished breath sounds bibasilar Abdomen: normal findings: soft, non-tender  Lab Results:  Recent Labs  04/22/12 2030 04/22/12 2043 04/23/12 0330  WBC 15.3*  --  15.0*  HGB 9.7* 9.5* 8.4*  HCT 28.0* 28.0* 24.7*  PLT 179  --  179   BMET:  Recent Labs  04/22/12 2043 04/23/12 0330  NA 139 138  K 4.0 4.2  CL 106 106  CO2  --  24  GLUCOSE 184* 114*  BUN 11 13  CREATININE 0.90 0.97  CALCIUM  --  8.4    PT/INR:  Recent Labs  04/22/12 1359  LABPROT 15.7*  INR 1.28   ABG    Component Value Date/Time   PHART 7.332* 04/22/2012 1933   HCO3 22.3 04/22/2012 1933   TCO2 24 04/22/2012 2043   ACIDBASEDEF 3.0* 04/22/2012 1933   O2SAT 97.0 04/22/2012 1933   CBG (last 3)   Recent Labs  04/22/12 2214 04/22/12 2327 04/23/12 0324  GLUCAP 156* 113* 103*     Assessment/Plan: S/P Procedure(s) (LRB): CORONARY ARTERY BYPASS GRAFTING (CABG) (N/A) TRANSESOPHAGEAL ECHOCARDIOGRAM (TEE) (N/A) Plan for transfer to step-down: see transfer orders CV- s/p CABG x 2, BIMA- has drug eluting stents in RI and RCA- restart plavix  Good hemodynamics- dc swan  Allergic to ACE-I  RESP- pulmonary toilet for basilar atelectasis  RENAL- lytes, creatinine OK- diurese  CBG - well controlled  Anemia- secondary to ABL- monitor   LOS: 1 day    Abbrielle Batts C 04/23/2012

## 2012-04-23 NOTE — Progress Notes (Signed)
Anesthesiology Follow-up:  Alert, neuro intact, hemodynamically stable. Chest tubes just removed.  VS: T-37.4 BP 109/69 HR 106 (SR) RR 23 O2 Sat 97 on 2L  K-4.2 Cr- 0.97 glucose 114 H/H- 8.4/24.7 Plts 179,000  Extubated 6 1/2 hours post-op, stable post-op course.  Kipp Brood

## 2012-04-23 NOTE — Op Note (Signed)
NAMEMarland Kitchen  Eric, Dickerson NO.:  [id number]  MEDICAL RECORD NO.:  [id number]  LOCATION:  2308                         FACILITY:  [facility]  PHYSICIAN:  Salvatore Decent. Dorris Fetch, M.D.DATE OF BIRTH:  [birth year]  DATE OF PROCEDURE:  04/22/2012 DATE OF DISCHARGE:                              OPERATIVE REPORT   PREOPERATIVE DIAGNOSIS:  Severe two-vessel coronary artery disease with exertional angina.  POSTOPERATIVE DIAGNOSIS:  Severe two-vessel coronary artery disease with exertional angina.  PROCEDURE:  Median sternotomy, extracorporeal circulation, coronary artery bypass grafting x2 (left internal mammary artery to left anterior descending, free right internal mammary artery to obtuse marginal 1).  SURGEON:  Salvatore Decent. Dorris Fetch, M.D.  ASSISTANT:  Lowella Dandy, PA-C  ANESTHESIA:  General.  FINDINGS:  Good quality mammary arteries, good quality targets.  CLINICAL NOTE:  Eric Dickerson is a 62 year old gentleman with a history of coronary artery disease who has previously had PTCA and stenting of his right coronary as well as an intermediate branch.  He now presents with exertional chest pain.  Dr. Eden Emms performed cardiac catheterization, which revealed the previous stents were patent, however, he had new tight ostial lesions in both the LAD and circumflex.  He had preserved left ventricular function.  He was referred for coronary artery bypass grafting.  The indications, risks, benefits, and alternatives were discussed in detail with the patient.  He understood and accepted the risks and agreed to proceed.  OPERATIVE NOTE:  Eric Dickerson was brought to the preoperative holding area on April 22, 2012.  There Anesthesia placed a Swan-Ganz catheter and arterial blood pressure monitoring lines.  Intravenous antibiotics were administered.  He was taken to the operating room, anesthetized, and intubated.  A Foley catheter was placed.  Transesophageal echocardiography was  performed.  It revealed normal left ventricular function with no significant valvular pathology.  The chest, abdomen, and legs were prepped and draped in usual sterile fashion.  A median sternotomy was performed.  The left internal mammary artery was harvested using standard technique.  5000 units of heparin was administered during the vessel harvest.  After harvesting the left mammary artery, the right internal mammary artery then was harvested, again using standard technique.  Both of these vessels were of good caliber, although both did bifurcate relatively high, so at this point the decision was not made as to whether the right mammary could be used as a pedicle graft or as a free graft, it was left attached at this point.  The remainder of the full heparin dose was given.  The pericardium was Opened. After confirming adequate anticoagulation with ACT measurement, the aorta was cannulated via concentric 2-0 Ethibond pledgeted pursestring sutures.  A dual-stage venous cannula was placed via pursestring suture in the right atrial appendage.  Cardiopulmonary bypass was instituted.  The coronary arteries were inspected and anastomotic sites were chosen.  It was clear at this point that the right mammary would not reach either the LAD or the obtuse marginal as a pedicle graft.  Therefore, it was clamped and divided proximally.  The proximal stump was suture ligated with a 2-0 silk suture.  An antegrade cardioplegic cannula was placed in the  ascending aorta.  The aorta was crossclamped.  The left ventricle was emptied via the aortic root vent.  Cardiac arrest then was achieved with a combination of cold antegrade blood cardioplegia and topical iced saline.  After achieving a complete diastolic arrest and adequate myocardial septal cooling to 10 degrees Celsius with 1 L of cardioplegia, the following distal anastomoses were performed.  First, the free RIMA was placed end-to-side to  the first obtuse marginal branch of the left circumflex.  This was a 1.5 mm good quality target.  The mammary was of good quality.  The end-to-side anastomosis was performed with a running 8-0 Prolene suture.  The mammary pedicle was tacked to the epicardial surface of the heart with 6- 0 Prolene sutures.  Additional cardioplegia was administered down the aortic root.  Next, the left internal mammary artery was brought through a window in the pericardium.  The distal end was beveled.  It was anastomosed end-to- side to the distal LAD.  This was a 1.5 mm good quality target.  The mammary was a good quality conduit.  The end-to-side anastomosis was performed with a running 8-0 Prolene suture.  After completion of the mammary to LAD anastomosis, the bulldog clamp was briefly removed to inspect for hemostasis.  Immediate and rapid septal rewarming was noted. The bulldog clamp was replaced, the mammary pedicle was tacked to the epicardial surface of the heart with 6-0 Prolene sutures.  The cardioplegia cannula was removed from the ascending aorta.  The proximal anastomosis for the right mammary graft was performed to a 4.0 mm punch aortotomy with a running 7-0 Prolene suture.  After completion of proximal anastomosis, the patient was placed in Trendelenburg position.  Lidocaine was administered.  The bulldog clamp was removed from the left mammary artery.  The aortic root was de-aired and the aortic crossclamp was removed.  Total crossclamp time was 50 minutes.  While rewarming, the patient initially fibrillated, and he required a single defibrillation with 10 joules.  The proximal and distal anastomoses were inspected for hemostasis.  On inspecting the distal anastomosis of the right mammary graft, it appeared that it was kinked or twisted to the distal anastomosis.  The tacking sutures were taken down and it was clear that this was not an acceptable result.  A partial occlusion clamp was  placed on the ascending aorta, encompassing the proximal anastomosis to the right coronary, that anastomosis was taken down, freeing the graft, which then was untwisted and there was excellent backflow.  The proximal anastomosis then was performed once again with a running 7-0 Prolene suture.  The partial occlusion clamp was removed.  Epicardial pacing wires were placed on the right ventricle and right atrium and DDD pacing was initiated.  When the patient had rewarmed to a core temperature of 37 degrees Celsius, he was weaned on the first attempt without difficulty.  He did not require inotropic support. Total bypass time was 98 minutes.  The initial cardiac index was greater than 2 liters/minute/meter squared and the patient remained hemodynamically stable throughout postbypass Period.  Postbypass transesophageal echocardiography revealed preserved left ventricular function unchanged from the prebypass study. A test dose of protamine was administered and was well tolerated.  The atrial and aortic cannulae were removed.  The remainder of protamine was administered without incident.  The chest was irrigated with warm saline.  Hemostasis was achieved.  The pericardium was reapproximated with interrupted 3-0 silk sutures, came together easily without tension or kinking the underlying grafts.  A 32-French Blake drains were placed in each pleura through separate subcostal incisions and a 28-French chest tube was placed into the mediastinum.  The sternum was closed with interrupted heavy gauge stainless steel wires.  The pectoralis fascia, subcutaneous tissue, and skin were closed in standard fashion.  All sponge, needle, and instrument counts were correct at the end of the procedure.  There were no intraoperative complications.  The patient was taken from the operating room to the surgical intensive care unit in good condition.     Salvatore Decent Dorris Fetch, M.D.     SCH/MEDQ  D:   04/22/2012  T:  04/23/2012  Job:  [id number]

## 2012-04-24 ENCOUNTER — Inpatient Hospital Stay (HOSPITAL_COMMUNITY): Payer: BC Managed Care – PPO

## 2012-04-24 LAB — CBC
Hemoglobin: 8 g/dL — ABNORMAL LOW (ref 13.0–17.0)
MCH: 28.7 pg (ref 26.0–34.0)
RBC: 2.79 MIL/uL — ABNORMAL LOW (ref 4.22–5.81)
WBC: 13.6 10*3/uL — ABNORMAL HIGH (ref 4.0–10.5)

## 2012-04-24 LAB — GLUCOSE, CAPILLARY
Glucose-Capillary: 105 mg/dL — ABNORMAL HIGH (ref 70–99)
Glucose-Capillary: 107 mg/dL — ABNORMAL HIGH (ref 70–99)

## 2012-04-24 LAB — BASIC METABOLIC PANEL
GFR calc Af Amer: 85 mL/min — ABNORMAL LOW (ref >90)
GFR calc non Af Amer: 73 mL/min — ABNORMAL LOW (ref >90)
Glucose, Bld: 98 mg/dL (ref 70–99)
Potassium: 4.2 mEq/L (ref 3.5–5.1)
Sodium: 139 mEq/L (ref 135–145)

## 2012-04-24 MED ORDER — METOPROLOL TARTRATE 25 MG PO TABS
25.0000 mg | ORAL_TABLET | Freq: Two times a day (BID) | ORAL | Status: DC
  Administered 2012-04-24: 25 mg via ORAL
  Filled 2012-04-24 (×2): qty 1

## 2012-04-24 MED ORDER — METOPROLOL TARTRATE 50 MG PO TABS
50.0000 mg | ORAL_TABLET | Freq: Two times a day (BID) | ORAL | Status: DC
  Administered 2012-04-24 – 2012-04-26 (×4): 50 mg via ORAL
  Filled 2012-04-24 (×6): qty 1

## 2012-04-24 NOTE — Progress Notes (Addendum)
2 Days Post-Op Procedure(s) (LRB): CORONARY ARTERY BYPASS GRAFTING (CABG) (N/A) TRANSESOPHAGEAL ECHOCARDIOGRAM (TEE) (N/A) Subjective:  Eric Dickerson has no complaints this morning.  He is ambulating without difficulty. +BM  Objective: Vital signs in last 24 hours: Temp:  [98.7 F (37.1 C)-100.1 F (37.8 C)] 99.3 F (37.4 C) (03/19 0516) Pulse Rate:  [72-119] 111 (03/19 0516) Cardiac Rhythm:  [-] Normal sinus rhythm;Sinus tachycardia (03/19 0800) Resp:  [20-30] 20 (03/19 0516) BP: (95-128)/(60-81) 116/76 mmHg (03/19 0516) SpO2:  [92 %-100 %] 92 % (03/19 0516) Arterial Line BP: (114-122)/(67-68) 122/68 mmHg (03/18 1100) Weight:  [207 lb 3.7 oz (94 kg)] 207 lb 3.7 oz (94 kg) (03/19 0516)  Hemodynamic parameters for last 24 hours: PAP: (33-35)/(16-19) 33/16 mmHg  Intake/Output from previous day: 03/18 0701 - 03/19 0700 In: 867 [P.O.:660; I.V.:157; IV Piggyback:50] Out: 745 [Urine:645; Chest Tube:100]  General appearance: alert, cooperative and no distress Heart: regular rate and rhythm Lungs: clear to auscultation bilaterally Abdomen: soft, non-tender; bowel sounds normal; no masses,  no organomegaly Extremities: edema trace Wound: clean and dry  Lab Results:  Recent Labs  04/23/12 0330 04/24/12 0413  WBC 15.0* 13.6*  HGB 8.4* 8.0*  HCT 24.7* 23.6*  PLT 179 163   BMET:  Recent Labs  04/23/12 0330 04/24/12 0413  NA 138 139  K 4.2 4.2  CL 106 106  CO2 24 24  GLUCOSE 114* 98  BUN 13 18  CREATININE 0.97 1.06  CALCIUM 8.4 8.6    PT/INR:  Recent Labs  04/22/12 1359  LABPROT 15.7*  INR 1.28   ABG    Component Value Date/Time   PHART 7.332* 04/22/2012 1933   HCO3 22.3 04/22/2012 1933   TCO2 24 04/22/2012 2043   ACIDBASEDEF 3.0* 04/22/2012 1933   O2SAT 97.0 04/22/2012 1933   CBG (last 3)   Recent Labs  04/23/12 1535 04/23/12 2119 04/24/12 0554  GLUCAP 145* 126* 105*    Assessment/Plan: S/P Procedure(s) (LRB): CORONARY ARTERY BYPASS GRAFTING (CABG)  (N/A) TRANSESOPHAGEAL ECHOCARDIOGRAM (TEE) (N/A)  1. CV- NSR Tachy- on Lopressor 12.5 mg BID, will increase to 25 mg BID 2. Pulm- wean off oxygen as tolerated, encouraged use of IS 3. Renal- creatinine WNL, volume status stable 4. Expected Blood Loss Anemia-stable hgb 8.0 5. CBGs controlled   LOS: 2 days    Lowella Dandy 04/24/2012  Doesn't feel as well today. He has a low grade temp- likely form atelectasis- IS Will increase lopressor to home dose of 50 BID for his sinus tachy

## 2012-04-24 NOTE — Progress Notes (Signed)
CARDIAC REHAB PHASE I   PRE:  Rate/Rhythm: 111ST  BP:  Supine:   Sitting: 121/84  Standing:    SaO2: 96%2L  MODE:  Ambulation   350  ft   POST:  Rate/Rhythm: 130 ST  BP:  Supine: 107/90  Sitting:   Standing:    SaO2: 92%RA 1130-1157 Pt c/o being cold and hesitant to walk. Room warm and pt with blankets. Told pt importance of walking and that I would put a blanket on his shoulders when we walked. Pt walked 350 ft on RA with rolling walker and asst x 1. Tolerated well with one standing rest break. Sats good on RA but HR to 130 so put back on oxygen in room. Set lunch up.   Luetta Nutting, RN BSN  04/24/2012 11:56 AM

## 2012-04-25 LAB — GLUCOSE, CAPILLARY: Glucose-Capillary: 91 mg/dL (ref 70–99)

## 2012-04-25 NOTE — Progress Notes (Signed)
Mid sternal dressing off. Site cleansed with betadine. Dressing left off. Incision WNL. Dion Saucier

## 2012-04-25 NOTE — Discharge Summary (Signed)
Physician Discharge Summary  Patient ID: Eric Dickerson MRN: [id number] DOB/AGE: 63-29-1952 63 y.o.  Admit date: 04/22/2012 Discharge date: 04/26/2012  Admission Diagnoses: 1. History of CAD (s/p DES to ramus, DES x 2 to RCA in 09') 2.History of hyperlipidemia (statin intolerant) 3.History of hypertension 4.History of tobacco abuse 5.History of GERD 6.History of mild anemia  Discharge Diagnoses:  1. History of CAD (s/p DES to ramus, DES x 2 to RCA in 09') 2.History of hyperlipidemia (statin intolerant) 3.History of hypertension 4.History of tobacco abuse 5.History of GERD 6.History of mild anemia   Procedure (s):  Median sternotomy, extracorporeal circulation, coronary  artery bypass grafting x2 (left internal mammary artery to left anterior descending, free right internal mammary artery to obtuse marginal 1) by Dr. Dorris Fetch on 04/22/2012.  History of Presenting Illness: This is a 62 year old African American gentleman with a history of coronary disease. He previously had PTCA and stenting of the ramus intermedius and right coronary in 2009. Recently, he began having exertional chest pain. He describes this as a burning sensation. It only occurs when he is walking up an incline or stairs and is relieved by rest. He has not had any rest or nocturnal symptoms. The symptoms were similar to the ones that he had prior to his angioplasty in 2009. He saw Dr. Eden Emms who recommended a cardiac catheterization. Cardiac catheterization done on 04/15/2012 revealed that the previous stents were patent. However, he has new tight ostial lesions of both the LAD and circumflex. These were not amenable to angioplasty. He was then seen in the office by Dr. Dorris Fetch for the consideration of coronary artery bypass grafting surgery. Potential risks, complications, and benefits of the surgery were discussed with the patient and he agreed to proceed. Pre operative carotid duplex US showed no significant  stenosis bilaterally. ABI's were 0.92 on the right and 0.61 on the left. He was admitted on 04/22/2012 in order to undergo a CABG x 2.  Brief Hospital Course:  He was extubated without difficulty the evening of surgery. He remained afebrile and hemodynamically stable. His Theone Murdoch, a line, chest tubes, and foley were all removed early in his post operative course. He was started on Lopressor and this was titrated accordingly. He was restarted on Plavix, as he had DES stents in the past. He was volume overloaded and diuresed accordingly. He was felt surgically stable for transfer from the ICU to PCTU for further convalescence on 04/23/2012. He has been tolerating a diet and has had a bowel movement. He had a history of mild anemia pre op and had anemia post op as well. His last H and H was 8 and 23.6. He did not require a blood transfusion. He did require a couple liters of oxygen via [place] post op, but was able to be weaned to room air. Epicardial pacing wires and chest tube sutures will be removed prior to discharge. Provided he remains afebrile, hemodynamically stable, and pending morning round evaluation, he will be surgically stable for discharge on 04/26/2012.    Latest Vital Signs: Blood pressure 132/87, pulse 110, temperature 99.8 F (37.7 C), temperature source Oral, resp. rate 18, height 6\' 2"  (1.88 m), weight 92.4 kg (203 lb 11.3 oz), SpO2 94.00%.  Physical Exam: Cardiovascular: Tachycardic, no murmur  Pulmonary: Clear to auscultation bilaterally; no rales, wheezes, or rhonchi.  Abdomen: Soft, non tender, bowel sounds present.  Extremities:No lower extremity edema.  Wound: Clean and dry. No erythema or signs of infection.   Discharge Condition:Stable  Recent laboratory studies:  Lab Results  Component Value Date   WBC 13.6* 04/24/2012   HGB 8.0* 04/24/2012   HCT 23.6* 04/24/2012   MCV 84.6 04/24/2012   PLT 163 04/24/2012   Lab Results  Component Value Date   NA 139 04/24/2012   K 4.2  04/24/2012   CL 106 04/24/2012   CO2 24 04/24/2012   CREATININE 1.06 04/24/2012   GLUCOSE 98 04/24/2012      Diagnostic Studies: Dg Chest 2 View  04/24/2012  *RADIOLOGY REPORT*  Clinical Data: Post CABG and sort chest.  CHEST - 2 VIEW  Comparison: 04/23/2012  Findings: Two views of the chest were obtained.  Again noted is a small right pneumothorax which has not significantly changed.  The right basilar chest tube has been removed and there is evidence for a small right pleural effusion.  Linear density in the left mid lung is suggestive for atelectasis.  The right jugular catheter and Swan-Ganz catheter have been removed.  Stable appearance of the heart and mediastinum. Removal of the mediastinal tube and the left chest tube.  IMPRESSION: Removal of bilateral chest drains.  There is a small right hydropneumothorax.  Left lung atelectasis.   Original Report Authenticated By: Richarda Overlie, M.D.      Future Appointments Provider Department Dept Phone   05/21/2012 10:30 AM Loreli Slot, MD [facility] [phone]      Discharge Medications:   Medication List    STOP taking these medications       nitroGLYCERIN 0.4 MG SL tablet  Commonly known as:  NITROSTAT      TAKE these medications       amLODipine 10 MG tablet  Commonly known as:  NORVASC  Take 1 tablet (10 mg total) by mouth daily.     aspirin EC 81 MG tablet  Take 81 mg by mouth daily.     clopidogrel 75 MG tablet  Commonly known as:  PLAVIX  Take 75 mg by mouth daily.     famotidine 20 MG tablet  Commonly known as:  PEPCID  Take 1 tablet (20 mg total) by mouth 2 (two) times daily.     metoprolol tartrate 25 MG tablet  Commonly known as:  LOPRESSOR  Take 2 tablets (50 mg total) by mouth 2 (two) times daily.     multivitamin with minerals Tabs  Take 1 tablet by mouth daily. Centrum silver     oxyCODONE 5 MG immediate release tablet  Commonly known as:  Oxy IR/ROXICODONE   Take 1-2 tablets (5-10 mg total) by mouth every 4 (four) hours as needed for pain.       The patient has been discharged on:   1.Beta Blocker:  Yes [  x ]                              No   [   ]                              If No, reason:  2.Ace Inhibitor/ARB: Yes [   ]                                     No  [  x  ]  If No, reason: Allergy and preserved LVEF  3.Statin:   Yes [   ]                  No  [ x  ]                  If No, reason: Allergy  4.Marlowe Kays:  Yes  [ x  ]                  No   [   ]                  If No, reason:   Follow Up Appointments:     Follow-up Information   Follow up with Charlton Haws, MD. (Call for a follow up appointment for 2 weeks)    Contact information:   [address] [place] [address] [phone]       Follow up with Loreli Slot, MD. (PA/LAT CXR to be taken (at [facility] which is in the same building as Dr. Sunday Corn office) on 05/21/2012 at 9:30 am;Appointment with Dr. Dorris Fetch is on 05/21/2012 at 10:30 am)    Contact information:   [address] [place] [address] [phone]       Follow up with Marga Melnick, MD. (Call for a follow up appointment regarding further surveillance of HGA1C 6)    Contact information:   [address] [facility] [address] [place] [address] [phone]       Signed: Doree Fudge MPA-C 04/25/2012, 9:09 AM

## 2012-04-25 NOTE — Progress Notes (Addendum)
                   [address]            [place] [address]          [phone]      3 Days Post-Op Procedure(s) (LRB): CORONARY ARTERY BYPASS GRAFTING (CABG) (N/A) TRANSESOPHAGEAL ECHOCARDIOGRAM (TEE) (N/A)  Subjective: Patient eating breakfast and is without complaints.  Objective: Vital signs in last 24 hours: Temp:  [99.4 F (37.4 C)-101.5 F (38.6 C)] 99.4 F (37.4 C) (03/20 0512) Pulse Rate:  [115-122] 116 (03/20 0512) Cardiac Rhythm:  [-] Normal sinus rhythm;Sinus tachycardia (03/19 2025) Resp:  [18-19] 19 (03/20 0512) BP: (107-125)/(69-81) 125/81 mmHg (03/20 0512) SpO2:  [91 %-98 %] 91 % (03/20 0512) Weight:  [93.1 kg (205 lb 4 oz)] 93.1 kg (205 lb 4 oz) (03/20 0512)  Pre op weight  93 kg Current Weight  04/25/12 93.1 kg (205 lb 4 oz)      Intake/Output from previous day: 03/19 0701 - 03/20 0700 In: 360 [P.O.:360] Out: 451 [Urine:450; Stool:1]   Physical Exam:  Cardiovascular: Tachycardic, no murmur Pulmonary: Clear to auscultation bilaterally; no rales, wheezes, or rhonchi. Abdomen: Soft, non tender, bowel sounds present. Extremities:No lower extremity edema. Wound: Clean and dry.  No erythema or signs of infection.  Lab Results: CBC: Recent Labs  04/23/12 0330 04/24/12 0413  WBC 15.0* 13.6*  HGB 8.4* 8.0*  HCT 24.7* 23.6*  PLT 179 163   BMET:  Recent Labs  04/23/12 0330 04/24/12 0413  NA 138 139  K 4.2 4.2  CL 106 106  CO2 24 24  GLUCOSE 114* 98  BUN 13 18  CREATININE 0.97 1.06  CALCIUM 8.4 8.6    PT/INR:  Lab Results  Component Value Date   INR 1.28 04/22/2012   INR 0.92 04/18/2012   INR 1.1* 04/10/2012   ABG:  INR: Will add last result for INR, ABG once components are confirmed Will add last 4 CBG results once components are confirmed  Assessment/Plan:  1. CV - ST. On Lopressor 50 bid and Plavix 75 daily 2.  Pulmonary - Encourage incentive spirometer and flutter valve 3.Remove EPW in am 4.  Acute  blood loss anemia - Last H and H 8 and 23.6 5. CBGs 121/124/91. Pre op HGA1C 6.1. Likely pre diabetic.  Will stop SS and scheduled Levemir 6.Possible discharge 1-2 days  ZIMMERMAN,DONIELLE MPA-C 04/25/2012,7:40 AM  Patient seen and examined. He looks good. Feels better today Sinus tachy with occasional PVC- give Am lopressor now Dc EPW Probably home in AM

## 2012-04-25 NOTE — Progress Notes (Signed)
EPW d/c at 1445 per MD order. All wires intact upon removal. No arrhythmias noted. Vital signs stable. Frequent vitals in place. Bed rest until 1545. Call bell in reach. Dion Saucier

## 2012-04-25 NOTE — Progress Notes (Signed)
CARDIAC REHAB PHASE I   PRE:  Rate/Rhythm: 103 ST    BP: sitting 104/74    SaO2: 93 RA  MODE:  Ambulation: 550 ft   POST:  Rate/Rhythm: 125 ST    BP: sitting 103/68     SaO2: 86 RA up to 91 RA  Tolerated well with RW. No major c/o except DOE. SaO2 low after walk with HR up to 125 ST. Return to recliner. Encouraged IS. Discussed [facility] and requests his name be sent to [place] [facility]. [id number]   Elissa Lovett [place] CES, ACSM 04/25/2012 11:12 AM

## 2012-04-25 NOTE — Care Management Note (Unsigned)
    Page 1 of 1   04/25/2012     3:06:49 PM   CARE MANAGEMENT NOTE 04/25/2012  Patient:  Eric Dickerson, Eric Dickerson   Account Number:  [id number]  Date Initiated:  04/25/2012  Documentation initiated by:  Sherrise Liberto  Subjective/Objective Assessment:   PT S/P CABG X 2 ON 04/22/12.  PTA, PT INDEPENDENT, LIVES WITH SPOUSE.     Action/Plan:   MET WITH PT TO DISCUSS DC PLANS.  PT STATES WIFE TO PROVIDE 24HR CARE AT DC.  PT HAS RW AT HOME, IF NEEDED.  WILL FOLLOW FOR ADDITIONAL NEEDS.   Anticipated DC Date:  04/27/2012   Anticipated DC Plan:  HOME/SELF CARE      DC Planning Services  CM consult      Choice offered to / List presented to:             Status of service:  In process, will continue to follow Medicare Important Message given?   (If response is "NO", the following Medicare IM given date fields will be blank) Date Medicare IM given:   Date Additional Medicare IM given:    Discharge Disposition:    Per UR Regulation:  Reviewed for med. necessity/level of care/duration of stay  If discussed at Long Length of Stay Meetings, dates discussed:    Comments:

## 2012-04-26 MED ORDER — OXYCODONE HCL 5 MG PO TABS: 5.0000 mg | ORAL_TABLET | ORAL | Status: DC | PRN

## 2012-04-26 NOTE — Progress Notes (Addendum)
[id number] Cardiac Rehab Completed discharge education with pt and brother. They voice understanding. Encouraged pt to watch Recovering from [facility] video. Melina Copa RN

## 2012-04-26 NOTE — Progress Notes (Addendum)
                   [address]            [place] [address]          [phone]      4 Days Post-Op Procedure(s) (LRB): CORONARY ARTERY BYPASS GRAFTING (CABG) (N/A) TRANSESOPHAGEAL ECHOCARDIOGRAM (TEE) (N/A)  Subjective: Patient  "washing up". Wants to go home  Objective: Vital signs in last 24 hours: Temp:  [99 F (37.2 C)-101.1 F (38.4 C)] 99.8 F (37.7 C) (03/21 0610) Pulse Rate:  [108-123] 110 (03/21 0610) Cardiac Rhythm:  [-] Normal sinus rhythm;Sinus tachycardia (03/20 2000) Resp:  [18] 18 (03/21 0610) BP: (100-132)/(70-87) 132/87 mmHg (03/21 0610) SpO2:  [92 %-95 %] 94 % (03/21 0610) Weight:  [92.4 kg (203 lb 11.3 oz)] 92.4 kg (203 lb 11.3 oz) (03/21 0610)  Pre op weight  93 kg Current Weight  04/26/12 92.4 kg (203 lb 11.3 oz)      Intake/Output from previous day: 03/20 0701 - 03/21 0700 In: 360 [P.O.:360] Out: 501 [Urine:500; Stool:1]   Physical Exam:  Cardiovascular: Tachycardic, no murmur Pulmonary: Clear to auscultation bilaterally; no rales, wheezes, or rhonchi. Abdomen: Soft, non tender, bowel sounds present. Extremities:No lower extremity edema. Wound: Clean and dry.  No erythema or signs of infection.  Lab Results: CBC:  Recent Labs  04/24/12 0413  WBC 13.6*  HGB 8.0*  HCT 23.6*  PLT 163   BMET:   Recent Labs  04/24/12 0413  NA 139  K 4.2  CL 106  CO2 24  GLUCOSE 98  BUN 18  CREATININE 1.06  CALCIUM 8.6    PT/INR:  Lab Results  Component Value Date   INR 1.28 04/22/2012   INR 0.92 04/18/2012   INR 1.1* 04/10/2012   ABG:  INR: Will add last result for INR, ABG once components are confirmed Will add last 4 CBG results once components are confirmed  Assessment/Plan:  1. CV - ST. On Lopressor 50 bid and Plavix 75 daily. Give Lopressor now 2.  Pulmonary - Encourage incentive spirometer and flutter valve 3.  Acute blood loss anemia - Last H and H 8 and 23.6 4.Possible discharge later  today  ZIMMERMAN,DONIELLE MPA-C 04/26/2012,7:54 AM  Looks great. Wants to go home. He is still a little tachycardic, continue lopressor at current dose Dc home

## 2012-04-30 ENCOUNTER — Encounter: Payer: BC Managed Care – PPO | Admitting: Internal Medicine

## 2012-05-13 ENCOUNTER — Telehealth (HOSPITAL_COMMUNITY): Payer: Self-pay | Admitting: *Deleted

## 2012-05-16 ENCOUNTER — Other Ambulatory Visit: Payer: Self-pay | Admitting: *Deleted

## 2012-05-16 DIAGNOSIS — I2510 Atherosclerotic heart disease of native coronary artery without angina pectoris: Secondary | ICD-10-CM

## 2012-05-17 ENCOUNTER — Ambulatory Visit (INDEPENDENT_AMBULATORY_CARE_PROVIDER_SITE_OTHER): Payer: BC Managed Care – PPO | Admitting: Physician Assistant

## 2012-05-17 ENCOUNTER — Encounter: Payer: Self-pay | Admitting: Physician Assistant

## 2012-05-17 VITALS — BP 120/86 | HR 88 | Ht 74.0 in | Wt 193.1 lb

## 2012-05-17 DIAGNOSIS — I739 Peripheral vascular disease, unspecified: Secondary | ICD-10-CM

## 2012-05-17 DIAGNOSIS — I10 Essential (primary) hypertension: Secondary | ICD-10-CM

## 2012-05-17 DIAGNOSIS — E782 Mixed hyperlipidemia: Secondary | ICD-10-CM

## 2012-05-17 DIAGNOSIS — I2510 Atherosclerotic heart disease of native coronary artery without angina pectoris: Secondary | ICD-10-CM

## 2012-05-17 MED ORDER — PRAVASTATIN SODIUM 20 MG PO TABS: 20.0000 mg | ORAL_TABLET | ORAL | Status: DC

## 2012-05-17 NOTE — Progress Notes (Signed)
[address] [place], [place]  [address] Phone: [phone], Fax:  [phone]  Date:  05/17/2012   ID:  Eric Dickerson, DOB 1951-01-09, MRN [id number]  PCP:  Marga Melnick, MD  Primary Cardiologist:  Dr. Charlton Haws   PV:  Dr. Lorine Bears    History of Present Illness: Eric Dickerson is a 62 y.o. male who returns for f/u after a recent admission to the hospital for CABG.  He has a hx of CAD, s/p Cypher DES to the ramus and Promus DES x 2 to the RCA 08/2007 (minimal disease in LAD and CFX at that time), PAD, HL, HTN.  Myoview 8/10: EF 63%, inf thinning, small prior ant infarct, no ischemia.  I saw him recently with progressively worsening chest pain. We set him up for cardiac catheterization.  LHC 04/15/12: Distal left main 20%, ostial LAD 80%, mid LAD 40% multiple discrete lesions, ramus intermediate patent stent, ostial circumflex 95%, proximal and mid RCA stents patent, PDA 40%, EF 55%.  He was referred for CABG. This was performed by Dr. Dorris Fetch 04/22/12 (LIMA-LAD, free RIMA-OM1).  Preop carotid Dopplers were negative for significant ICA stenosis bilaterally. ABIs: Right 0.92, left 0.61. Postoperative course was fairly uneventful aside from ABL anemia and volume overload. He remained in NSR.  Since discharge, he has done well. Chest is still somewhat sore. Denies significant dyspnea. He has been walking close to a mile a day. He denies orthopnea, PND, edema or syncope.  Labs (10/11):  LDL 131 Labs (12/12):  K 4.1, creatinine 1.0, ALT 17, Hgb 13.4, TSH 2.21 Labs (6/13):    K 4.5, creatinine 1.2 Labs (3/14):    K 4.2, Cr 1.06, ALT 18, Hgb 8   Wt Readings from Last 3 Encounters:  04/26/12 203 lb 11.3 oz (92.4 kg)  04/26/12 203 lb 11.3 oz (92.4 kg)  04/18/12 205 lb (92.987 kg)     Past Medical History  Diagnosis Date  . CAD (coronary artery disease)     a. CAD,  b. s/p Cypher DES to the ramus and Promus DES x 2 to the RCA 08/2007 (minimal disease in LAD and  CFX at that time), c. Myoview 8/10: EF 63%, inf thinning, small prior ant infarct, no ischemia;  d. s/p CABG 3/14 (L-LAD, RIMA-OM1)  . Other abnormal glucose   . Hyperlipidemia     statin intolerant  . HTN (hypertension)   . ERECTILE DYSFUNCTION   . GERD   . HIATAL HERNIA   . DIVERTICULOSIS, COLON   . HYPERPLASIA PROSTATE UNS W/O UR OBST & OTH LUTS   . DEGENERATIVE JOINT DISEASE, RIGHT HIP   . SPINAL STENOSIS, LUMBAR   . LUMBAR RADICULOPATHY, RIGHT   . ANEMIA, MILD   . PAD (peripheral artery disease)     pre CABG 04/2012 => ABIs: Right 0.92, left 0.61    Current Outpatient Prescriptions  Medication Sig Dispense Refill  . amLODipine (NORVASC) 10 MG tablet Take 1 tablet (10 mg total) by mouth daily.  30 tablet  11  . aspirin EC 81 MG tablet Take 81 mg by mouth daily.      . clopidogrel (PLAVIX) 75 MG tablet Take 75 mg by mouth daily.      . famotidine (PEPCID) 20 MG tablet Take 1 tablet (20 mg total) by mouth 2 (two) times daily.  60 tablet  3  . metoprolol tartrate (LOPRESSOR) 25 MG tablet Take 2 tablets (50 mg total) by mouth 2 (two)  times daily.  60 tablet  11  . Multiple Vitamin (MULTIVITAMIN WITH MINERALS) TABS Take 1 tablet by mouth daily. Centrum silver      . oxyCODONE (OXY IR/ROXICODONE) 5 MG immediate release tablet Take 1-2 tablets (5-10 mg total) by mouth every 4 (four) hours as needed for pain.  40 tablet  0   No current facility-administered medications for this visit.    Allergies:    Allergies  Allergen Reactions  . Olmesartan Medoxomil Rash  . Quinapril Hcl Other (See Comments)     cough; ACE inhibitors are contraindicated because of a history of rash with dimensions of receptor blocker.  . Glucosamine Other (See Comments)    Doesn't remember reaction  . Rosuvastatin Other (See Comments)    : muscle cramps  . Verapamil Other (See Comments)    Extra sensitive to sun    Social History:  The patient  reports that he quit smoking about 27 years ago. He does not  have any smokeless tobacco history on file. He reports that he does not drink alcohol or use illicit drugs.   ROS:  Please see the history of present illness.   No fevers, chills, cough.  All other systems reviewed and negative.   PHYSICAL EXAM: VS:  BP 120/86  Pulse 88  Ht 6\' 2"  (1.88 m)  Wt 193 lb 1.9 oz (87.599 kg)  BMI 24.78 kg/m2 Well nourished, well developed, in no acute distress HEENT: normal Neck: no JVD Cardiac:  normal S1, S2; RRR; no murmur Chest: Median sternotomy scar well-healed without erythema or discharge Lungs:  clear to auscultation bilaterally, no wheezing, rhonchi or rales Abd: soft, nontender, no hepatomegaly Ext: no edema Skin: warm and dry Neuro:  CNs 2-12 intact, no focal abnormalities noted  EKG:  NSR, HR 89, normal axis, RBBB, nonspecific ST-T wave changes.      ASSESSMENT AND PLAN:  1. Coronary Artery Disease:  Doing well after recent CABG.  He is not interested in cardiac rehabilitation. He will continue to increase his activity on his own. He has been intolerant to aspirin. He will continue Plavix. 2. Hypertension:  Controlled.  Continue current therapy.  3. Hyperlipidemia:  He has been intolerant to some statins. He agrees to try Pravastatin 20 mg QHS on Mon, Wed, Fri only.  Check Lipids and LFTs in 6 weeks.   4. PAD:  Follow up with Dr. Lorine Bears as planned. 5. Disposition:  F/u with Dr. Charlton Haws in 6-8 weeks.  Luna Glasgow, PA-C  11:33 AM 05/17/2012

## 2012-05-17 NOTE — Patient Instructions (Addendum)
START PRAVASTATIN 20 MG ; TAKE ON Monday, WED, AND FRI'S ONLY  FASTING LIPID AND LIVER PANEL TO BE DONE 07/05/12  PLEASE FOLLOW UP WITH DR. Eden Emms IN ABOUT 6-8 WEEKS

## 2012-05-20 ENCOUNTER — Ambulatory Visit (INDEPENDENT_AMBULATORY_CARE_PROVIDER_SITE_OTHER): Payer: BC Managed Care – PPO | Admitting: Physician Assistant

## 2012-05-20 ENCOUNTER — Ambulatory Visit
Admission: RE | Admit: 2012-05-20 | Discharge: 2012-05-20 | Disposition: A | Discharge status: Home / Self Care | Payer: BC Managed Care – PPO | Source: Ambulatory Visit | Attending: Thoracic Surgery (Cardiothoracic Vascular Surgery) | Admitting: Thoracic Surgery (Cardiothoracic Vascular Surgery)

## 2012-05-20 VITALS — BP 123/86 | HR 97 | Resp 16 | Ht 74.0 in | Wt 193.5 lb

## 2012-05-20 DIAGNOSIS — Z951 Presence of aortocoronary bypass graft: Secondary | ICD-10-CM

## 2012-05-20 DIAGNOSIS — I2510 Atherosclerotic heart disease of native coronary artery without angina pectoris: Secondary | ICD-10-CM

## 2012-05-20 NOTE — Progress Notes (Signed)
[address]            [place] [address]          [phone]     HPI: Patient returns for routine postoperative follow-up having undergone CABG x 2 by Dr. Dorris Fetch on 04/22/2012.  The patient's postoperative course was generally uneventful, and he was discharged home on 04/26/2012.  Since hospital discharge, the patient has continued to progress well.  He has been walking a lot at home, around 1 mile at a time, including some hills, and is not having any shortness of breath or chest tightness.  He was seen by Tereso Newcomer, PA-C on 4/11 and was started on a trial of a low dose statin as he has had intolerances in the past.  He is eating well, and has not taken any pain medicine since he was discharged.  He denies lower extremity edema.    Current Outpatient Prescriptions  Medication Sig Dispense Refill  . amLODipine (NORVASC) 10 MG tablet Take 1 tablet (10 mg total) by mouth daily.  30 tablet  11  . clopidogrel (PLAVIX) 75 MG tablet Take 75 mg by mouth daily.      . famotidine (PEPCID) 20 MG tablet Take 20 mg by mouth 2 (two) times daily as needed.      . metoprolol tartrate (LOPRESSOR) 25 MG tablet Take 2 tablets (50 mg total) by mouth 2 (two) times daily.  60 tablet  11  . Multiple Vitamin (MULTIVITAMIN WITH MINERALS) TABS Take 1 tablet by mouth daily. Centrum silver      . NITROSTAT 0.4 MG SL tablet Place 0.4 mg under the tongue every 5 (five) minutes as needed.       Marland Kitchen oxyCODONE (OXY IR/ROXICODONE) 5 MG immediate release tablet Take 1-2 tablets (5-10 mg total) by mouth every 4 (four) hours as needed for pain.  40 tablet  0  . pravastatin (PRAVACHOL) 20 MG tablet Take 1 tablet (20 mg total) by mouth as directed. TAKE Monday, WED, AND FRI'S ONLY  30 tablet  11   No current facility-administered medications for this visit.     Physical Exam: Filed Vitals:   05/20/12 1306  BP: 123/86  Pulse: 97  Resp: 16    Wounds: Sternal and chest tube  incisions healing well without erythema or drainage.  Sternum is stable to palpation. Heart: regular rate and rhythm Lungs:Clear to auscultation Extremities: No lower extremity edema   Diagnostic Tests: Chest xray: Dg Chest 2 View  05/20/2012  *RADIOLOGY REPORT*  Clinical Data: Recent heart surgery.  CHEST - 2 VIEW  Comparison: 04/24/2012.  Findings: Trachea is midline.  Heart size normal.  Sternotomy wires are unchanged in position.  Small loculated left pleural effusion. Right pleural effusion has resolved in the interval.  Overall improvement in aeration bilaterally with minimal residual streaky atelectasis in the left lower lobe.  No pneumothorax.  IMPRESSION:  1.  Small loculated left pleural effusion. 2.  Resolved right pleural effusion. 3.  Minimal residual streaky atelectasis in the left lower lobe.   Original Report Authenticated By: Leanna Battles, M.D.        Assessment/Plan: Eric Dickerson is doing well following CABG x 2.  He would prefer to do cardiac rehab at [facility], where he works, rather than at [employer], and I think this should be fine.  He may begin driving and increasing his  activity as tolerated.  He plans to return to work in May with a decreased schedule initially.  We will see him back as needed.

## 2012-05-21 ENCOUNTER — Ambulatory Visit: Payer: BC Managed Care – PPO | Admitting: Thoracic Surgery (Cardiothoracic Vascular Surgery)

## 2012-06-29 ENCOUNTER — Telehealth: Payer: Self-pay | Admitting: Adult Health

## 2012-06-29 ENCOUNTER — Other Ambulatory Visit: Payer: Self-pay | Admitting: Adult Health

## 2012-06-29 NOTE — Telephone Encounter (Signed)
Patient states he has called our office at the first of the week to have refill authorized on amlodipine 10 mg daily but has not been called in.  I authorized refills called to [facility] pharmacy on [address], in [place].  He is also stating that he has a rash from institution of metoprolol. Will need follow up appt to discuss alternative medications.

## 2012-07-05 ENCOUNTER — Telehealth: Payer: Self-pay | Admitting: *Deleted

## 2012-07-05 ENCOUNTER — Other Ambulatory Visit: Payer: Self-pay | Admitting: *Deleted

## 2012-07-05 ENCOUNTER — Other Ambulatory Visit (INDEPENDENT_AMBULATORY_CARE_PROVIDER_SITE_OTHER): Payer: BC Managed Care – PPO

## 2012-07-05 DIAGNOSIS — I10 Essential (primary) hypertension: Secondary | ICD-10-CM

## 2012-07-05 DIAGNOSIS — E782 Mixed hyperlipidemia: Secondary | ICD-10-CM

## 2012-07-05 DIAGNOSIS — I2510 Atherosclerotic heart disease of native coronary artery without angina pectoris: Secondary | ICD-10-CM

## 2012-07-05 LAB — HEPATIC FUNCTION PANEL
Alkaline Phosphatase: 68 U/L (ref 39–117)
Bilirubin, Direct: 0 mg/dL (ref 0.0–0.3)
Total Protein: 7.2 g/dL (ref 6.0–8.3)

## 2012-07-05 LAB — LIPID PANEL
Cholesterol: 161 mg/dL (ref 0–200)
LDL Cholesterol: 103 mg/dL — ABNORMAL HIGH (ref 0–99)

## 2012-07-05 MED ORDER — AMLODIPINE BESYLATE 10 MG PO TABS: 10.0000 mg | ORAL_TABLET | Freq: Every day | ORAL | Status: DC

## 2012-07-05 NOTE — Telephone Encounter (Signed)
lmom labs ok, no changes to be made 

## 2012-07-05 NOTE — Telephone Encounter (Signed)
Pt has appt with Dr. Eden Emms on 07/12/12

## 2012-07-05 NOTE — Telephone Encounter (Signed)
Message copied by Tarri Fuller on Fri Jul 05, 2012  2:33 PM ------      Message from: [place], [place] T      Created: Fri Jul 05, 2012  1:24 PM       LFTs ok      Lipids ok on Pravastatin 3 x per week      Continue with current treatment plan.      Tereso Newcomer, PA-C  1:24 PM 07/05/2012 ------

## 2012-07-05 NOTE — Telephone Encounter (Signed)
See note by K. Lyman Bishop, NP. Please make sure someone is getting him an appt as she notes to discuss medications. Tereso Newcomer, PA-C   07/05/2012 8:29 AM

## 2012-07-08 ENCOUNTER — Telehealth: Payer: Self-pay | Admitting: Cardiovascular Disease

## 2012-07-08 NOTE — Telephone Encounter (Signed)
PT AWARE OF LAB RESULTS./CY 

## 2012-07-08 NOTE — Telephone Encounter (Signed)
New problem   Pt want to know results of labs he had done 07/05/12. Please call pt

## 2012-07-12 ENCOUNTER — Ambulatory Visit: Payer: BC Managed Care – PPO | Admitting: Cardiovascular Disease

## 2012-09-06 ENCOUNTER — Encounter: Payer: Self-pay | Admitting: Internal Medicine

## 2012-09-06 ENCOUNTER — Ambulatory Visit (INDEPENDENT_AMBULATORY_CARE_PROVIDER_SITE_OTHER): Payer: BC Managed Care – PPO | Admitting: Internal Medicine

## 2012-09-06 VITALS — BP 116/68 | HR 68 | Temp 98.3°F | Resp 12 | Ht 73.03 in | Wt 197.0 lb

## 2012-09-06 DIAGNOSIS — D649 Anemia, unspecified: Secondary | ICD-10-CM

## 2012-09-06 DIAGNOSIS — I2510 Atherosclerotic heart disease of native coronary artery without angina pectoris: Secondary | ICD-10-CM

## 2012-09-06 DIAGNOSIS — Z1331 Encounter for screening for depression: Secondary | ICD-10-CM

## 2012-09-06 DIAGNOSIS — E782 Mixed hyperlipidemia: Secondary | ICD-10-CM

## 2012-09-06 DIAGNOSIS — N429 Disorder of prostate, unspecified: Secondary | ICD-10-CM

## 2012-09-06 DIAGNOSIS — R7309 Other abnormal glucose: Secondary | ICD-10-CM | POA: Insufficient documentation

## 2012-09-06 DIAGNOSIS — R3915 Urgency of urination: Secondary | ICD-10-CM

## 2012-09-06 LAB — CBC WITH DIFFERENTIAL/PLATELET
HCT: 38.4 % — ABNORMAL LOW (ref 39.0–52.0)
Hemoglobin: 12.3 g/dL — ABNORMAL LOW (ref 13.0–17.0)
Lymphocytes Relative: 26 % (ref 12–46)
Lymphs Abs: 1.7 10*3/uL (ref 0.7–4.0)
MCHC: 32 g/dL (ref 30.0–36.0)
Monocytes Absolute: 0.6 10*3/uL (ref 0.1–1.0)
Monocytes Relative: 10 % (ref 3–12)
Neutro Abs: 4.2 10*3/uL (ref 1.7–7.7)

## 2012-09-06 NOTE — Progress Notes (Signed)
  Subjective:    Patient ID: Eric Dickerson, male    DOB: 1951/01/30, 62 y.o.   MRN: [id number]  HPI   He was scheduled for an annual followup; but he had bypass surgery 04/22/12 has been followed by cardiology and his cardiovascular surgeon.  His previous labs were reviewed. He was significantly anemic postoperatively with hematocrit of 23.6. No iron post discharge. While hospitalized his glucose was as high as 184.  His most recent LDL was 103 and HDL 48.2 on Pravastatin 20 mg 3 days / week. GFR was minimally reduced at 85; but creatinine and BUN were normal.  All medications are prescribed through cardiology.  He does have acute issues of urinary urgency intermittently. This is not a persistent symptom.   Review of Systems He denies hesitancy,dribbling, dysuria, pyuria, or hematuria. He also denies polyuria, polydipsia, or polyphagia. He denies epistaxis, hemoptysis,  melena, or rectal bleeding.He has no unexplained weight loss, dysphagia, or abdominal pain. He has no abnormal bruising or bleeding. He has no difficulty stopping bleeding with injury.     Objective:   Physical Exam General appearance ; thi but adequately  Nourished;t w/o distress.  Eyes: No conjunctival inflammation or scleral icterus is present. Arcus senilis  Oral exam: Dentures; lips and gums are healthy appearing.There is no oropharyngeal erythema or exudate noted.   Heart:  Normal rate and regular rhythm. S1 and S2 normal without gallop, murmur, click, rub or other extra sounds.S4   Lungs:Chest clear to auscultation; no wheezes, rhonchi,rales ,or rubs present.No increased work of breathing.   Abdomen: bowel sounds normal, soft and non-tender without masses, organomegaly or hernias noted.  No guarding or rebound   Genitalia: Genitalia normal except for smaller L testis & left varices. Prostate is broad & flat without enlargement, asymmetry, nodularity, or induration.    Skin:Warm & dry.  Intact without  suspicious lesions or rashes ; no jaundice or tenting  Lymphatic: No lymphadenopathy is noted about the head, neck, axilla, or inguinal areas.    All pulses intact without  Bruits but pedal pulses decreased .No ischemic skin changes.             Assessment & Plan:   #1 urgency #2 anemia #3 hyperglycemia #4 LDL not @ goal of <70; ? Increase Pravastatin vs trial to Livalo 2 mg 3 X/ week See Orders

## 2012-09-06 NOTE — Addendum Note (Signed)
Addended by: Silvio Pate D on: 09/06/2012 03:36 PM   Modules accepted: Orders

## 2012-09-06 NOTE — Patient Instructions (Addendum)
Change the pravastatin to Livalo 2mg  Monday, Wednesday, and Friday (samples). Recheck fasting lipids, hepatic panel, and CK after 6-8 weeks. Your minimal LDL goal is less than 70.   If you activate the  My Chart system; lab & Xray results will be released directly  to you as soon as I review & address these through the computer. If you choose not to sign up for My Chart within 36 hours of labs being drawn; results will be reviewed & interpretation added before being copied & mailed, causing a delay in getting the results to you.If you do not receive that report within 7-10 days ,please call. Additionally you can use this system to gain direct  access to your records  if  out of town or @ an office of a  physician who is not in  the My Chart network.  This improves continuity of care & places you in control of your medical record.

## 2012-09-07 LAB — HEMOGLOBIN A1C: Mean Plasma Glucose: 120 mg/dL — ABNORMAL HIGH (ref <117)

## 2012-09-17 ENCOUNTER — Ambulatory Visit (INDEPENDENT_AMBULATORY_CARE_PROVIDER_SITE_OTHER): Payer: BC Managed Care – PPO | Admitting: Cardiovascular Disease

## 2012-09-17 VITALS — BP 113/74 | HR 89 | Wt 197.0 lb

## 2012-09-17 DIAGNOSIS — E782 Mixed hyperlipidemia: Secondary | ICD-10-CM

## 2012-09-17 DIAGNOSIS — I10 Essential (primary) hypertension: Secondary | ICD-10-CM

## 2012-09-17 DIAGNOSIS — I2510 Atherosclerotic heart disease of native coronary artery without angina pectoris: Secondary | ICD-10-CM

## 2012-09-17 NOTE — Assessment & Plan Note (Signed)
F/U Dr Alwyn Ren med change noted  Near target for post CABG

## 2012-09-17 NOTE — Assessment & Plan Note (Addendum)
Well controlled.  Continue current medications and low sodium Dash type diet.    

## 2012-09-17 NOTE — Assessment & Plan Note (Signed)
Stable with no angina and good activity level.  Continue medical Rx  

## 2012-09-17 NOTE — Progress Notes (Signed)
Patient ID: Eric Dickerson, male   DOB: 1950-04-20, 62 y.o.   MRN: [id number] Eric Dickerson is a 62 y.o. male who returns for f/u after a recent admission to the hospital for CABG. He has a hx of CAD, s/p Cypher DES to the ramus and Promus DES x 2 to the RCA 08/2007 (minimal disease in LAD and CFX at that time), PAD, HL, HTN. Myoview 8/10: EF 63%, inf thinning, small prior ant infarct, no ischemia. I saw him recently with progressively worsening chest pain. We set him up for cardiac catheterization. LHC 04/15/12: Distal left main 20%, ostial LAD 80%, mid LAD 40% multiple discrete lesions, ramus intermediate patent stent, ostial circumflex 95%, proximal and mid RCA stents patent, PDA 40%, EF 55%. He was referred for CABG. This was performed by Dr. Dorris Fetch 04/22/12 (LIMA-LAD, free RIMA-OM1). Preop carotid Dopplers were negative for significant ICA stenosis bilaterally. ABIs: Right 0.92, left 0.61. Postoperative course was fairly uneventful aside from ABL anemia and volume overload. He remained in NSR. Since discharge, he has done well. Chest is still somewhat sore. Denies significant dyspnea. He has been walking close to a mile a day. He denies orthopnea, PND, edema or syncope.  Dr Alwyn Ren has changed his cholesterol pill   ROS: Denies fever, malais, weight loss, blurry vision, decreased visual acuity, cough, sputum, SOB, hemoptysis, pleuritic pain, palpitaitons, heartburn, abdominal pain, melena, lower extremity edema, claudication, or rash.  All other systems reviewed and negative  General: Affect appropriate Healthy:  appears stated age HEENT: normal Neck supple with no adenopathy Sternum well healed JVP normal no bruits no thyromegaly Lungs clear with no wheezing and good diaphragmatic motion Heart:  S1/S2 no murmur, no rub, gallop or click PMI normal Abdomen: benighn, BS positve, no tenderness, no AAA no bruit.  No HSM or HJR Distal pulses intact with no bruits No edema Neuro non-focal Skin warm  and dry No muscular weakness   Current Outpatient Prescriptions  Medication Sig Dispense Refill  . amLODipine (NORVASC) 10 MG tablet Take 1 tablet (10 mg total) by mouth daily.  30 tablet  11  . clopidogrel (PLAVIX) 75 MG tablet Take 75 mg by mouth. 1/2 by mouth daily      . famotidine (PEPCID) 20 MG tablet Take 20 mg by mouth 2 (two) times daily as needed.      . metoprolol tartrate (LOPRESSOR) 25 MG tablet Take 25 mg by mouth 2 (two) times daily.      . Multiple Vitamin (MULTIVITAMIN WITH MINERALS) TABS Take 1 tablet by mouth daily. Centrum silver      . NITROSTAT 0.4 MG SL tablet Place 0.4 mg under the tongue every 5 (five) minutes as needed.       . Pitavastatin Calcium (LIVALO) 2 MG TABS Take by mouth. 1 tab mon, wed, fri       No current facility-administered medications for this visit.    Allergies  Olmesartan medoxomil; Quinapril hcl; Glucosamine; Rosuvastatin; and Verapamil  Electrocardiogram:  05/17/12  SR rate 89 ICRBBB nonspecific ST/T wave changes   Assessment and Plan

## 2012-12-12 ENCOUNTER — Other Ambulatory Visit: Payer: Self-pay

## 2013-04-28 ENCOUNTER — Other Ambulatory Visit: Payer: Self-pay | Admitting: Cardiovascular Disease

## 2013-04-28 ENCOUNTER — Other Ambulatory Visit: Payer: Self-pay | Admitting: Physician Assistant

## 2013-07-06 ENCOUNTER — Other Ambulatory Visit: Payer: Self-pay | Admitting: Cardiovascular Disease

## 2013-07-15 ENCOUNTER — Other Ambulatory Visit (INDEPENDENT_AMBULATORY_CARE_PROVIDER_SITE_OTHER): Payer: BC Managed Care – PPO

## 2013-07-15 ENCOUNTER — Ambulatory Visit (INDEPENDENT_AMBULATORY_CARE_PROVIDER_SITE_OTHER): Payer: BC Managed Care – PPO | Admitting: Family Medicine

## 2013-07-15 ENCOUNTER — Ambulatory Visit (INDEPENDENT_AMBULATORY_CARE_PROVIDER_SITE_OTHER)
Admission: RE | Admit: 2013-07-15 | Discharge: 2013-07-15 | Disposition: A | Discharge status: Home / Self Care | Payer: BC Managed Care – PPO | Source: Ambulatory Visit | Attending: Family Medicine | Admitting: Family Medicine

## 2013-07-15 ENCOUNTER — Encounter: Payer: Self-pay | Admitting: Family Medicine

## 2013-07-15 VITALS — BP 136/80 | HR 74 | Ht 74.0 in | Wt 202.0 lb

## 2013-07-15 DIAGNOSIS — M1710 Unilateral primary osteoarthritis, unspecified knee: Secondary | ICD-10-CM

## 2013-07-15 DIAGNOSIS — M25562 Pain in left knee: Secondary | ICD-10-CM

## 2013-07-15 DIAGNOSIS — M25569 Pain in unspecified knee: Secondary | ICD-10-CM

## 2013-07-15 DIAGNOSIS — S83249A Other tear of medial meniscus, current injury, unspecified knee, initial encounter: Secondary | ICD-10-CM | POA: Insufficient documentation

## 2013-07-15 DIAGNOSIS — IMO0002 Reserved for concepts with insufficient information to code with codable children: Secondary | ICD-10-CM

## 2013-07-15 NOTE — Assessment & Plan Note (Signed)
Seen on ultrasound today. We discussed icing he was given a home exercise program. Patient was given a brace today as well. Patient will come back again in 3 weeks for further evaluation.

## 2013-07-15 NOTE — Progress Notes (Signed)
  [facility] [address] [place], [place] [address] Phone: [phone] Subjective:    I'm seeing this patient by the request  of:  Eric Cobble, MD   CC: Left knee pain  Eric Dickerson is a 63 y.o. male coming in with complaint of left knee pain. Patient states it has gotten worse over the last couple weeks if not the last couple months. Patient does not remember any true injury. Patient states all the pain is on the medial aspect of the knee. States that they can be a clicking sensation sometimes. Patient denies though if her giving on him. Patient has tried some over-the-counter medications with mild improvement. Patient denies any nighttime awakening. Patient was the severity of 7/10.     Past medical history, social, surgical and family history all reviewed in electronic medical record.   Review of Systems: No headache, visual changes, nausea, vomiting, diarrhea, constipation, dizziness, abdominal pain, skin rash, fevers, chills, night sweats, weight loss, swollen lymph nodes, body aches, joint swelling, muscle aches, chest pain, shortness of breath, mood changes.   Objective Blood pressure 136/80, pulse 74, height 6\' 2"  (1.88 m), weight 202 lb (91.627 kg), SpO2 97.00%.  General: No apparent distress alert and oriented x3 mood and affect normal, dressed appropriately.  HEENT: Pupils equal, extraocular movements intact  Respiratory: Patient's speak in full sentences and does not appear short of breath  Cardiovascular: No lower extremity edema, non tender, no erythema  Skin: Warm dry intact with no signs of infection or rash on extremities or on axial skeleton.  Abdomen: Soft nontender  Neuro: Cranial nerves II through XII are intact, neurovascularly intact in all extremities with 2+ DTRs and 2+ pulses.  Lymph: No lymphadenopathy of posterior or anterior cervical chain or axillae bilaterally.  Gait normal with good balance and  coordination.  MSK:  Non tender with full range of motion and good stability and symmetric strength and tone of shoulders, elbows, wrist, hip, and ankles bilaterally.  Knee: Left Normal to inspection with no erythema or effusion or obvious bony abnormalities. Palpation show severe medial joint line tenderness. ROM full in flexion and extension and lower leg rotation. Ligaments with solid consistent endpoints including ACL, PCL, LCL, MCL. Positive Mcmurray's, Apley's, and Thessalonian tests. Non painful patellar compression. Patellar glide was a moderate crepitus. Patellar and quadriceps tendons unremarkable. Hamstring and quadriceps strength is normal.   MSK US performed of: Left knee This study was ordered, performed, and interpreted by Charlann Boxer D.O.  Knee: All structures visualized. Anteromedial meniscus is significantly displaced secondary to severe osteoarthritis and narrowing of the medial joint space. , anterolateral, posteromedial, and posterolateral menisci unremarkable but does have narrowing of the joint spaces.. Patellar Tendon unremarkable on long and transverse views without effusion. No abnormality of prepatellar bursa. LCL and MCL unremarkable on long and transverse views. No abnormality of origin of medial or lateral head of the gastrocnemius.  IMPRESSION:  Moderate to severe osteoarthritis with a displaced medial meniscal tear.    Impression and Recommendations:     This case required medical decision making of moderate complexity.

## 2013-07-15 NOTE — Patient Instructions (Addendum)
Nice to meet you xrays downstairs today Exercises 3 times a week Ice 20 minutes 2 times a day Try the medicine 3 times a day for 6 days.  Stop if it hurts your stomach.  Vitamin D 2000 IU daily.  Tylenol 650 mg three times daily.  Come back again in 3 weeks.

## 2013-07-15 NOTE — Assessment & Plan Note (Signed)
Patient does have severe osteoporosis we will get an x-ray for further evaluation. Patient was given a brace today to offload the medial compartment. In addition to this the patient also has a medial meniscal tear which is going to complicate rehabilitation. We discussed icing, discussed medications and easily going to do for 6 days. Patient knows if he has any discomfort on the stomach he is going to stop the medications. Patient will try some over-the-counter medications he can be beneficial as well. The patient has continued pain he could come back in 3 weeks for further evaluation.

## 2013-08-07 ENCOUNTER — Encounter: Payer: Self-pay | Admitting: Family Medicine

## 2013-08-07 ENCOUNTER — Ambulatory Visit (INDEPENDENT_AMBULATORY_CARE_PROVIDER_SITE_OTHER): Payer: BC Managed Care – PPO | Admitting: Family Medicine

## 2013-08-07 VITALS — BP 120/82 | HR 73 | Ht 74.0 in | Wt 203.0 lb

## 2013-08-07 DIAGNOSIS — M1710 Unilateral primary osteoarthritis, unspecified knee: Secondary | ICD-10-CM

## 2013-08-07 DIAGNOSIS — M1712 Unilateral primary osteoarthritis, left knee: Secondary | ICD-10-CM

## 2013-08-07 NOTE — Patient Instructions (Signed)
Good to see you You are doing better Continue the compression sleeve Ice 10-20 minutes after activity and before bed.  Try the pennsaid topically 2 times  Daily  Call us Monday if you like it and we will send in a prescription that will be shipped to your house.  Would love to see you again in 4 weeks. Otherwise if pain gets worse you knoe where I am.

## 2013-08-07 NOTE — Assessment & Plan Note (Signed)
Patient is doing remarkably well at this time with conservative therapy and is about 50-60% better subjectively. We also discussed continuing compression sleeve as well as the icing protocol. Patient was given to more strengthening exercises I think would be beneficial. Patient will try a topical anti-inflammatory on a trial basis and see if this is helpful. Patient will stop the oral anti-inflammatories and continue the over-the-counter medications that seems to be helping. Patient will follow up again in 4 weeks for further evaluation. At that time if any worsening pain or no improvement I would consider corticosteroid injection.  Spent greater than 25 minutes with patient face-to-face and had greater than 50% of counseling including as described above in assessment and plan.

## 2013-08-07 NOTE — Progress Notes (Signed)
  [name] [address] [place], [place] [address] Phone: [phone] Subjective:    I'm seeing this patient by the request  of:  Unice Cobble, MD   CC: Left knee pain  FBP:[id number] Eric Dickerson is a 63 y.o. male coming in with complaint of left knee pain. Patient does have left knee pain showing the patient does have severe medial compartment degenerative changes and does have an acute meniscal tear. Patient was seen previously and given a brace as well as home exercise program. Patient was also to do icing: Over-the-counter medications which is not doing on a regular basis. Patient states that he is approximately 70% better at this time. Patient states he is able to make it a day without any significant pain and weakness a dull aching sensation at the end of the day. Patient has not been wearing the brace a regular basis because he feels like he can be uncomfortable. Patient denies any new symptoms such as swelling or any radiation of pain. Patient overall is feeling somewhat better. Patient is also already stopped the anti-inflammatories.    Past medical history, social, surgical and family history all reviewed in electronic medical record.   Review of Systems: No headache, visual changes, nausea, vomiting, diarrhea, constipation, dizziness, abdominal pain, skin rash, fevers, chills, night sweats, weight loss, swollen lymph nodes, body aches, joint swelling, muscle aches, chest pain, shortness of breath, mood changes.   Objective Blood pressure 120/82, pulse 73, height 6\' 2"  (1.88 m), weight 203 lb (92.08 kg), SpO2 98.00%.  General: No apparent distress alert and oriented x3 mood and affect normal, dressed appropriately.  HEENT: Pupils equal, extraocular movements intact  Respiratory: Patient's speak in full sentences and does not appear short of breath  Cardiovascular: No lower extremity edema, non tender, no erythema  Skin: Warm dry intact with no  signs of infection or rash on extremities or on axial skeleton.  Abdomen: Soft nontender  Neuro: Cranial nerves II through XII are intact, neurovascularly intact in all extremities with 2+ DTRs and 2+ pulses.  Lymph: No lymphadenopathy of posterior or anterior cervical chain or axillae bilaterally.  Gait normal with good balance and coordination.  MSK:  Non tender with full range of motion and good stability and symmetric strength and tone of shoulders, elbows, wrist, hip, and ankles bilaterally.  Knee: Left Normal to inspection with no erythema or effusion or obvious bony abnormalities. Mild medial joint line tenderness ROM full in flexion and extension and lower leg rotation. Ligaments with solid consistent endpoints including ACL, PCL, LCL, MCL. Continued Positive Mcmurray's, Apley's, and Thessalonian tests. Non painful patellar compression. Patellar glide was a moderate crepitus. Patellar and quadriceps tendons unremarkable. Hamstring and quadriceps strength is normal.     Impression and Recommendations:     This case required medical decision making of moderate complexity.

## 2013-09-05 ENCOUNTER — Ambulatory Visit: Payer: BC Managed Care – PPO | Admitting: Family Medicine

## 2013-10-21 ENCOUNTER — Ambulatory Visit: Payer: BC Managed Care – PPO | Admitting: Internal Medicine

## 2013-10-28 ENCOUNTER — Ambulatory Visit (INDEPENDENT_AMBULATORY_CARE_PROVIDER_SITE_OTHER): Payer: BC Managed Care – PPO | Admitting: Internal Medicine

## 2013-10-28 ENCOUNTER — Encounter: Payer: Self-pay | Admitting: Internal Medicine

## 2013-10-28 VITALS — BP 120/88 | HR 81 | Temp 98.5°F | Resp 13 | Ht 74.0 in | Wt 203.0 lb

## 2013-10-28 DIAGNOSIS — Z0000 Encounter for general adult medical examination without abnormal findings: Secondary | ICD-10-CM

## 2013-10-28 NOTE — Progress Notes (Signed)
Pre visit review using our clinic review tool, if applicable. No additional management support is needed unless otherwise documented below in the visit note. 

## 2013-10-28 NOTE — Progress Notes (Signed)
Subjective:    Patient ID: Eric Dickerson, male    DOB: May 03, 1950, 63 y.o.   MRN: [id number]  HPI   He is here for a physical;acute issues include intermittent polyuria over past 8 years and intermittent hearing loss on L.   He is on modified heart healthy diet. Exercise consists of intermittent walking without associated symptoms. BP < 135/90 or less @ home.  Because of his history coronary disease; his LDL goal is less than 70  There is a strong family history of premature coronary disease.      Review of Systems  Significant headaches, epistaxis, chest pain, palpitations, exertional dyspnea, claudication, paroxysmal nocturnal dyspnea, or edema absent. No abdominal symptoms, memory loss or myalgias on Pravastatin     Objective:   Physical Exam  Pertinent or positive findings  include: Dense arcus senilis is present. Cerumen impaction noted on the left He has complete dentures. Pedal pulses are decreased. There are no ischemic changes of the feet.  Pes planus is present. Prostate broad & flat   Gen.: Healthy and well-nourished in appearance. Alert, appropriate and cooperative throughout exam. Appears younger than stated age  Head: Normocephalic without obvious abnormalities; pattern alopecia  Eyes: No corneal or conjunctival inflammation noted. Pupils equal round reactive to light and accommodation. Extraocular motion intact.  Ears: External  ear exam reveals no significant lesions or deformities. Canals clear .TMs normal. Hearing is grossly normal bilaterally. Nose: External nasal exam reveals no deformity or inflammation. Nasal mucosa are pink and moist. No lesions or exudates noted.   Mouth: Oral mucosa and oropharynx reveal no lesions or exudates.  Neck: No deformities, masses, or tenderness noted. Range of motion & Thyroid normal. Lungs: Normal respiratory effort; chest expands symmetrically. Lungs are clear to auscultation without rales, wheezes, or increased work of  breathing. Heart: Normal rate and rhythm. Normal S1 and S2. No gallop, click, or rub. No murmur. Abdomen: Bowel sounds normal; abdomen soft and nontender. No masses, organomegaly or hernias noted. Genitalia: Genitalia normal except for left varices. Prostate is without asymmetry, nodularity, or induration                            Musculoskeletal/extremities: No deformity or scoliosis noted of  the thoracic or lumbar spine.  No clubbing, cyanosis, edema, or significant extremity  deformity noted. Range of motion normal .Tone & strength normal. Hand joints normal Fingernail / toenail health good. Able to lie down & sit up w/o help. Negative SLR bilaterally Vascular: Carotid, radial artery, dorsalis pedis and  posterior tibial pulses are equal. No bruits present. Neurologic: Alert and oriented x3. Deep tendon reflexes symmetrical and normal.  Gait normal  .      Skin: Intact without suspicious lesions or rashes. Lymph: No cervical, axillary, or inguinal lymphadenopathy present. Psych: Mood and affect are normal. Normally interactive                                                                                        Assessment & Plan:  #1 comprehensive physical exam; no acute findings  Plan: see Orders  &  Recommendations

## 2013-10-28 NOTE — Patient Instructions (Signed)
Please do not use Q-tips as we discussed. Should wax build up occur, please put 2-3 drops of mineral oil in the ear at night and cover the canal with a  cotton ball. In the morning fill the canal with hydrogen peroxide & leave  for 10-15 minutes. Following this shower and use the thinnest washrag available to wick out the wax.  

## 2013-10-30 ENCOUNTER — Other Ambulatory Visit (INDEPENDENT_AMBULATORY_CARE_PROVIDER_SITE_OTHER): Payer: BC Managed Care – PPO

## 2013-10-30 DIAGNOSIS — Z0000 Encounter for general adult medical examination without abnormal findings: Secondary | ICD-10-CM

## 2013-10-30 LAB — CBC WITH DIFFERENTIAL/PLATELET
BASOS PCT: 0.5 % (ref 0.0–3.0)
Basophils Absolute: 0 10*3/uL (ref 0.0–0.1)
EOS ABS: 0.4 10*3/uL (ref 0.0–0.7)
Eosinophils Relative: 5.7 % — ABNORMAL HIGH (ref 0.0–5.0)
HCT: 39.2 % (ref 39.0–52.0)
Hemoglobin: 12.8 g/dL — ABNORMAL LOW (ref 13.0–17.0)
Lymphocytes Relative: 23 % (ref 12.0–46.0)
Lymphs Abs: 1.5 10*3/uL (ref 0.7–4.0)
MCHC: 32.6 g/dL (ref 30.0–36.0)
MCV: 87.1 fl (ref 78.0–100.0)
Monocytes Absolute: 0.5 10*3/uL (ref 0.1–1.0)
Monocytes Relative: 8 % (ref 3.0–12.0)
Neutro Abs: 4 10*3/uL (ref 1.4–7.7)
Neutrophils Relative %: 62.8 % (ref 43.0–77.0)
Platelets: 249 10*3/uL (ref 150.0–400.0)
RBC: 4.5 Mil/uL (ref 4.22–5.81)
RDW: 14.5 % (ref 11.5–15.5)
WBC: 6.4 10*3/uL (ref 4.0–10.5)

## 2013-10-30 LAB — BASIC METABOLIC PANEL
BUN: 14 mg/dL (ref 6–23)
CO2: 27 mEq/L (ref 19–32)
CREATININE: 1 mg/dL (ref 0.4–1.5)
Calcium: 9.6 mg/dL (ref 8.4–10.5)
Chloride: 104 mEq/L (ref 96–112)
GFR: 92.4 mL/min (ref >60.00)
Glucose, Bld: 85 mg/dL (ref 70–99)
Potassium: 4.3 mEq/L (ref 3.5–5.1)
Sodium: 138 mEq/L (ref 135–145)

## 2013-10-30 LAB — HEPATIC FUNCTION PANEL
ALT: 18 U/L (ref 0–53)
AST: 21 U/L (ref 0–37)
Albumin: 3.7 g/dL (ref 3.5–5.2)
Alkaline Phosphatase: 75 U/L (ref 39–117)
BILIRUBIN DIRECT: 0 mg/dL (ref 0.0–0.3)
BILIRUBIN TOTAL: 0.5 mg/dL (ref 0.2–1.2)
TOTAL PROTEIN: 7.2 g/dL (ref 6.0–8.3)

## 2013-10-30 LAB — LIPID PANEL
CHOL/HDL RATIO: 4
Cholesterol: 230 mg/dL — ABNORMAL HIGH (ref 0–200)
HDL: 53.3 mg/dL (ref >39.00)
LDL CALC: 166 mg/dL — AB (ref 0–99)
NonHDL: 176.7
Triglycerides: 52 mg/dL (ref 0.0–149.0)
VLDL: 10.4 mg/dL (ref 0.0–40.0)

## 2013-10-30 LAB — URINALYSIS
BILIRUBIN URINE: NEGATIVE
HGB URINE DIPSTICK: NEGATIVE
Ketones, ur: NEGATIVE
LEUKOCYTES UA: NEGATIVE
NITRITE: NEGATIVE
Specific Gravity, Urine: 1.01 (ref 1.000–1.030)
Total Protein, Urine: NEGATIVE
UROBILINOGEN UA: 0.2 (ref 0.0–1.0)
Urine Glucose: NEGATIVE
pH: 7 (ref 5.0–8.0)

## 2013-10-30 LAB — FERRITIN: FERRITIN: 70.7 ng/mL (ref 22.0–322.0)

## 2013-10-30 LAB — PSA: PSA: 4.07 ng/mL — ABNORMAL HIGH (ref 0.10–4.00)

## 2013-10-30 LAB — TSH: TSH: 1.91 u[IU]/mL (ref 0.35–4.50)

## 2013-11-02 ENCOUNTER — Other Ambulatory Visit: Payer: Self-pay | Admitting: Internal Medicine

## 2013-11-02 DIAGNOSIS — R972 Elevated prostate specific antigen [PSA]: Secondary | ICD-10-CM

## 2013-11-02 MED ORDER — PRAVASTATIN SODIUM 40 MG PO TABS: 40.0000 mg | ORAL_TABLET | Freq: Every day | ORAL | Status: DC

## 2013-11-11 ENCOUNTER — Telehealth: Payer: Self-pay | Admitting: Internal Medicine

## 2013-11-11 NOTE — Telephone Encounter (Signed)
Patient is requesting call on results of labs and what Dr. Linna Darner would like him to do.

## 2013-11-11 NOTE — Telephone Encounter (Signed)
Patient advised Dr Linna Darner would like him to see a urologist regarding his PSA. Referral already placed by Dr Linna Darner. Patient aware he is waiting on a call from [facility].

## 2014-03-08 ENCOUNTER — Other Ambulatory Visit: Payer: Self-pay | Admitting: Internal Medicine

## 2014-04-04 IMAGING — CR DG CHEST 1V PORT
1 series · 1 of 1 positions shown · non-contrast
Comparison: Chest x-ray 04/22/2012.

CLINICAL DATA: Status post CABG.

PORTABLE CHEST - 1 VIEW

[AP]
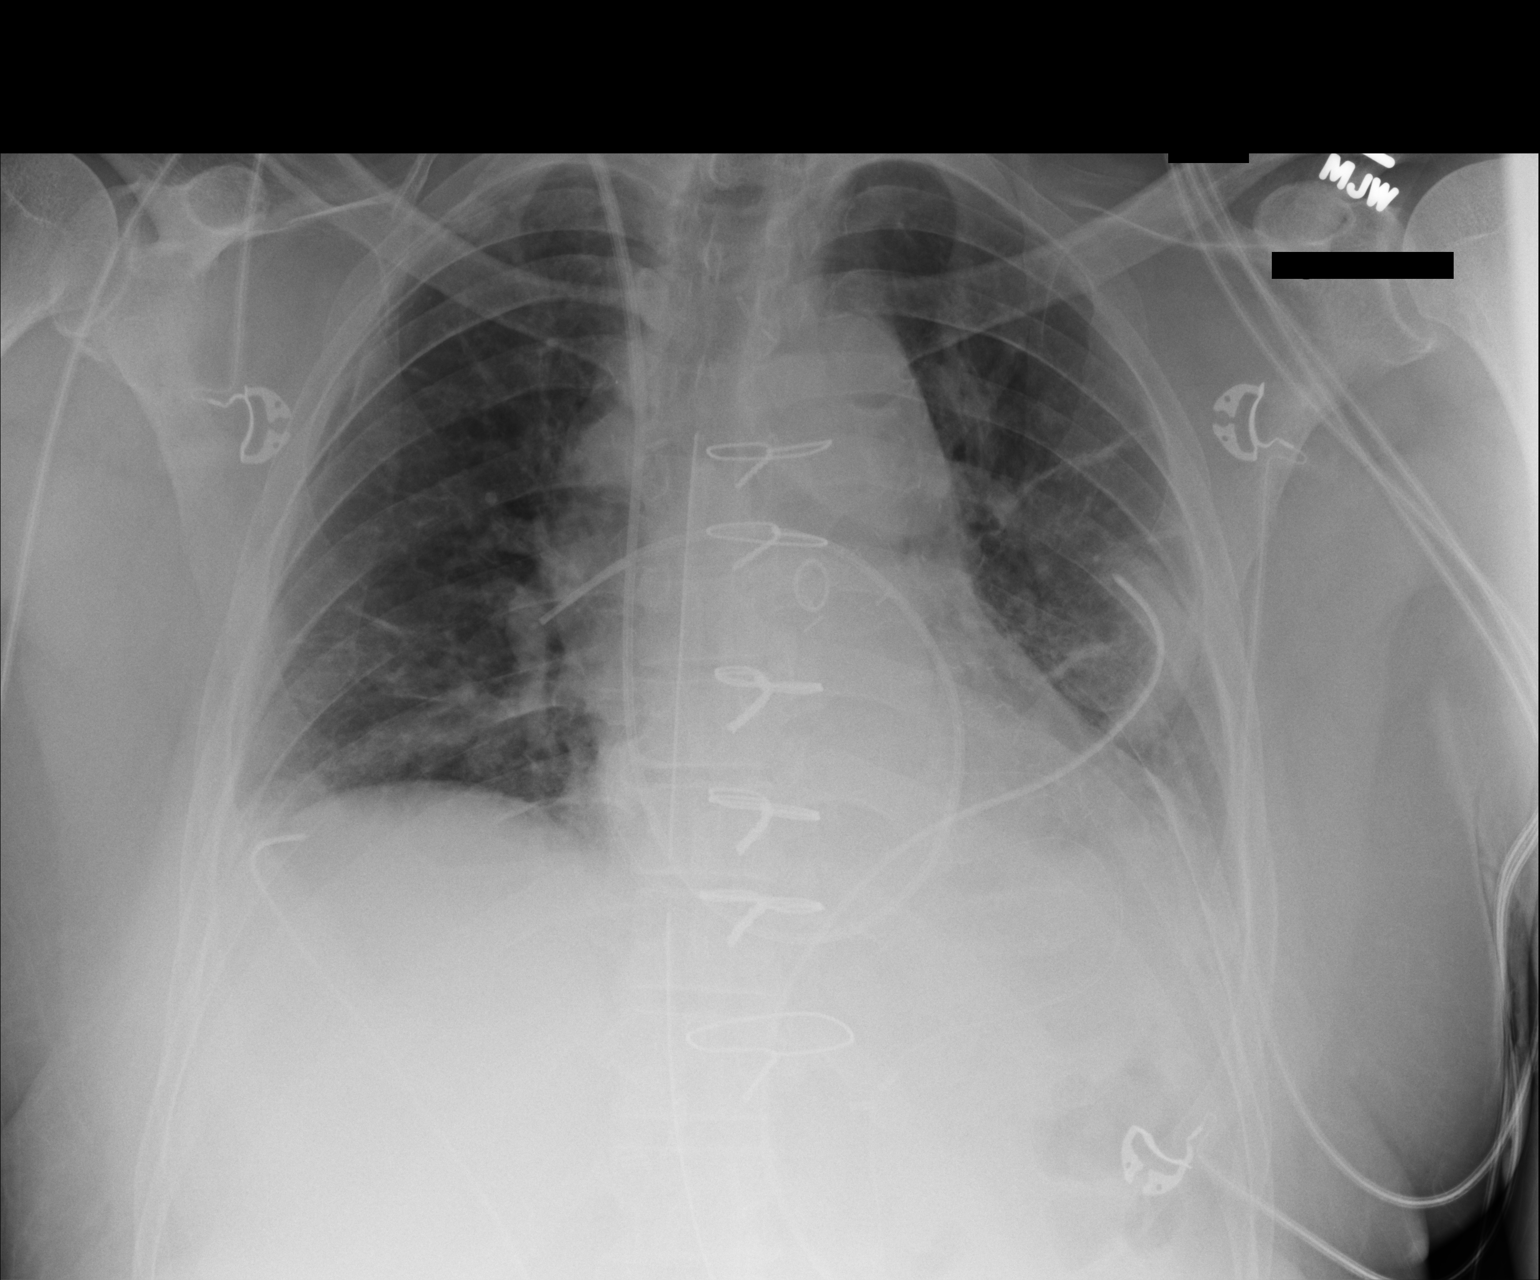

[1 of 1 positions shown; findings below may reference images not displayed]

FINDINGS: Previously noted endotracheal tube and nasogastric tube
have been removed.  Mediastinal / pericardial drain remains in
position projecting over the mid thorax.  Left-sided chest tube
remains in position with tip projecting over the lateral aspect of
the mid left hemithorax.  The right-sided chest tube is in position
with tip projecting over the lower right hemithorax.  Right IJ
central venous Cordis through which a Swan Ganz catheter has been
passed into the right descending pulmonary artery branch.
Epicardial pacing wires remain in position.  Status post median
sternotomy for CABG.  Lung volumes are low.  There are persistent
bibasilar opacities (left greater than right), favored to reflect
residual postoperative atelectasis.  A small left pleural effusion
is unchanged. There is a lucency in the lateral aspect of the right
hemithorax which is favored to represent a skin fold, however, a
small right-sided pneumothorax is difficult to entirely exclude.
No left pneumothorax is identified at this time.  Crowding of the
pulmonary vasculature, without frank pulmonary edema.
Cardiopericardial silhouette is within normal limits. The patient
is rotated to the left on today's exam, resulting in distortion of
the mediastinal contours and reduced diagnostic sensitivity and
specificity for mediastinal pathology.  Atherosclerosis in the
thoracic aorta.
IMPRESSION: 1.  Support apparatus, as above.
2.  Probable skin fold projecting over the lateral aspect of the
right hemithorax.  This may alternatively represent a tiny right-
sided pneumothorax, however, there is a chest tube in position.
This could be further evaluated with a repeat portable chest x-ray
at this time.
3.  Low lung volumes with bibasilar subsegmental atelectasis and
small left pleural effusion.

These results will be called to the ordering clinician or
representative by the Radiologist Assistant, and communication
documented in the PACS Dashboard.

## 2014-04-05 IMAGING — CR DG CHEST 2V
2 series · 2 of 2 positions shown · non-contrast
Comparison: 04/23/2012

CLINICAL DATA: Post CABG and sort chest.

CHEST - 2 VIEW

[w chest pa]
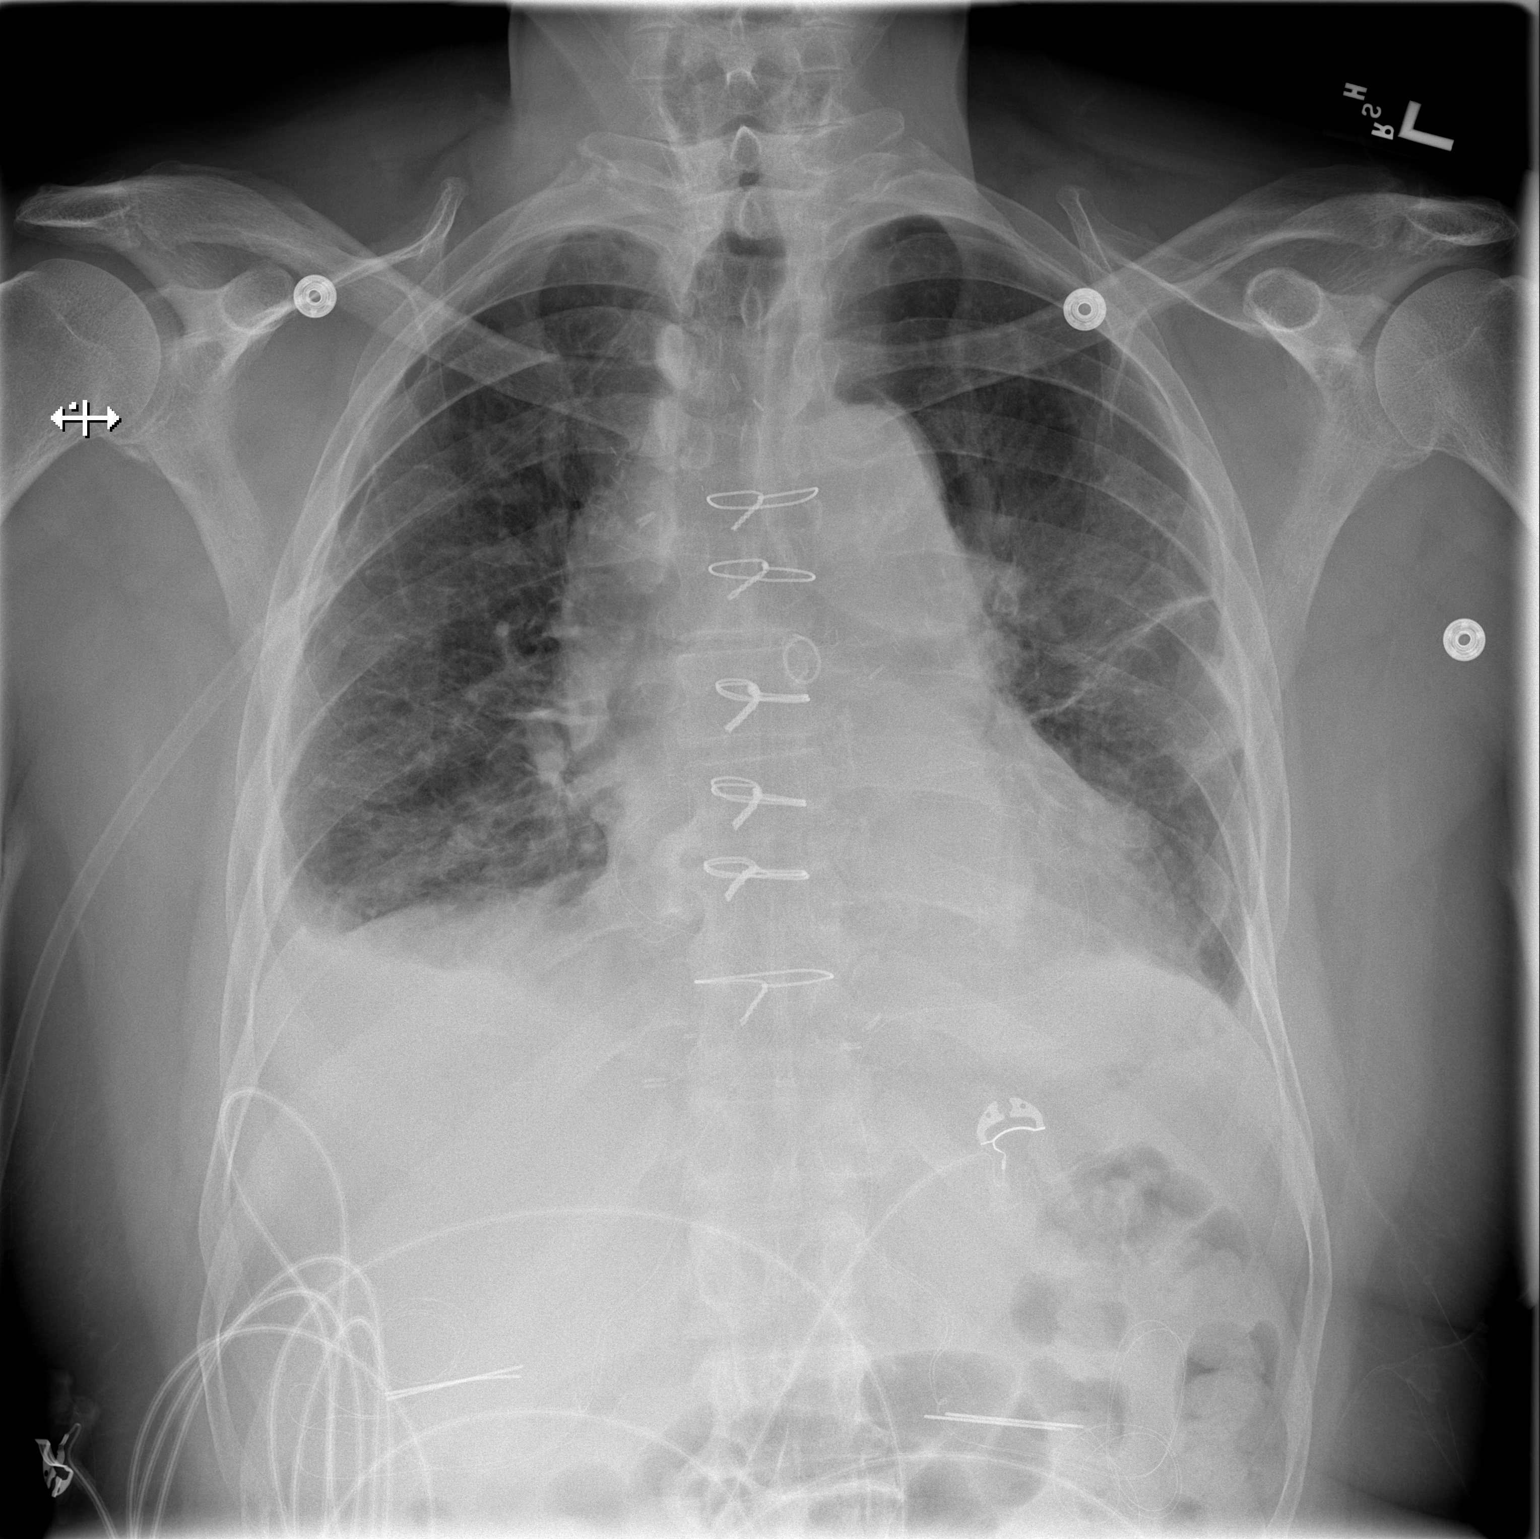

[w chest lat]
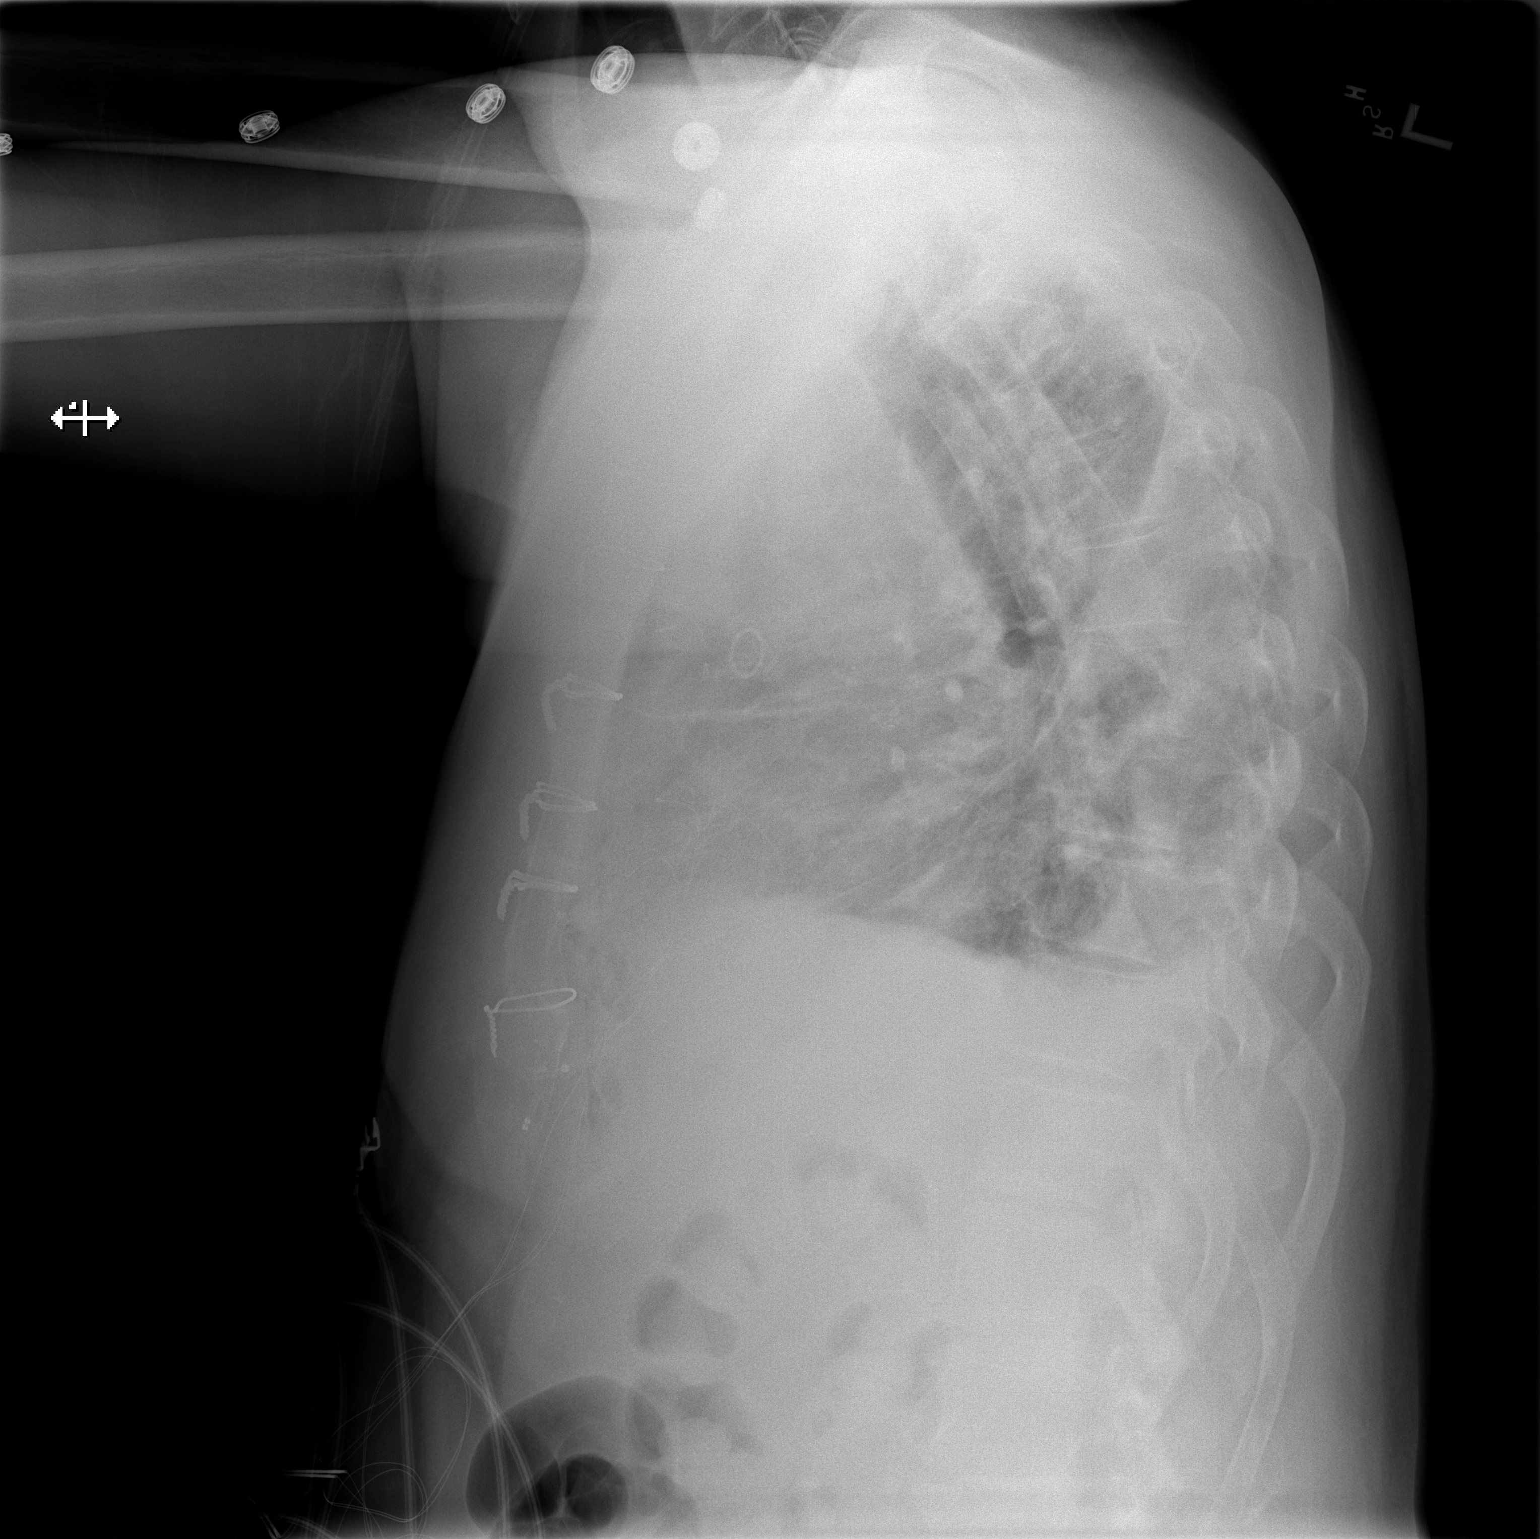

[2 of 2 positions shown; findings below may reference images not displayed]

FINDINGS: Two views of the chest were obtained.  Again noted is a
small right pneumothorax which has not significantly changed.  The
right basilar chest tube has been removed and there is evidence for
a small right pleural effusion.  Linear density in the left mid
lung is suggestive for atelectasis.  The right jugular catheter and
Swan-Ganz catheter have been removed.  Stable appearance of the
heart and mediastinum. Removal of the mediastinal tube and the left
chest tube.
IMPRESSION: Removal of bilateral chest drains.  There is a small right
hydropneumothorax.

Left lung atelectasis.

## 2014-07-12 ENCOUNTER — Other Ambulatory Visit: Payer: Self-pay | Admitting: Internal Medicine

## 2014-07-13 NOTE — Telephone Encounter (Signed)
Please advise on refill. Patient has not been seen since 2014. Thanks, MI

## 2014-07-15 ENCOUNTER — Other Ambulatory Visit: Payer: Self-pay

## 2014-07-15 MED ORDER — CLOPIDOGREL BISULFATE 75 MG PO TABS: 75.0000 mg | ORAL_TABLET | Freq: Every day | ORAL | Status: DC

## 2014-07-15 MED ORDER — METOPROLOL TARTRATE 25 MG PO TABS: 25.0000 mg | ORAL_TABLET | Freq: Two times a day (BID) | ORAL | Status: DC

## 2014-07-15 NOTE — Telephone Encounter (Signed)
REFILLED BY ROSE  JACOBS

## 2014-07-15 NOTE — Telephone Encounter (Signed)
Per note 4.11.14 patient has appt 7.25.16

## 2014-07-16 MED ORDER — METOPROLOL TARTRATE 25 MG PO TABS: 25.0000 mg | ORAL_TABLET | Freq: Two times a day (BID) | ORAL | Status: DC

## 2014-07-22 ENCOUNTER — Other Ambulatory Visit: Payer: Self-pay | Admitting: Cardiovascular Disease

## 2014-07-30 ENCOUNTER — Encounter: Payer: Self-pay | Admitting: Internal Medicine

## 2014-07-30 ENCOUNTER — Ambulatory Visit: Payer: BC Managed Care – PPO | Admitting: Internal Medicine

## 2014-07-30 ENCOUNTER — Ambulatory Visit (INDEPENDENT_AMBULATORY_CARE_PROVIDER_SITE_OTHER): Payer: BC Managed Care – PPO | Admitting: Internal Medicine

## 2014-07-30 VITALS — BP 130/86 | HR 67 | Temp 98.4°F | Resp 16 | Wt 209.0 lb

## 2014-07-30 DIAGNOSIS — L563 Solar urticaria: Secondary | ICD-10-CM | POA: Diagnosis not present

## 2014-07-30 MED ORDER — PREDNISONE 10 MG PO TABS: ORAL_TABLET | ORAL | Status: DC

## 2014-07-30 MED ORDER — RANITIDINE HCL 150 MG PO TABS: 150.0000 mg | ORAL_TABLET | Freq: Two times a day (BID) | ORAL | Status: DC

## 2014-07-30 NOTE — Progress Notes (Signed)
   Subjective:    Patient ID: Eric Dickerson, male    DOB: 1950/05/16, 64 y.o.   MRN: [id number]  HPI  He has had a persistent rash over the lower upper arm and forearms for the last month. It is pruritic. He definitely feels heat aggravates it. He's used Benadryl orally and cortisone topically for the itch with some benefit. He does wear a hat and has no facial rash. The rash tends to be worse on the left upper extremity as it is exposed to sun when he drives.  He has no other constitutional or systemic symptoms. He is not on HCTZ. He's not been on doxycycline.  Review of Systems  No associated itchy, watery eyes.  Swelling of the lips or tongue denied.  Shortness of breath, wheezing, or cough absent.  Fever ,chills , or sweats denied. Purulence absent.  Diarrhea not present.      Objective:   Physical Exam Pertinent or positive findings include: He has hyperpigmentation over the distal upper arms and forearms and hands. He has scattered urticarial lesions mainly on the left upper extremity. Dermatographia can be elicited. He has complete dentures.   General appearance :adequately nourished; in no distress.  Eyes: No conjunctival inflammation or scleral icterus is present.  Oral exam:  Lips and gums are healthy appearing.There is no oropharyngeal erythema or exudate noted.   Heart:  Normal rate and regular rhythm. S1 and S2 normal without gallop, murmur, click, rub or other extra sounds    Lungs:Chest clear to auscultation; no wheezes, rhonchi,rales ,or rubs present.No increased work of breathing.   Vascular : all pulses equal ; no bruits present.  Skin:Warm & dry ; no tenting    Lymphatic: No lymphadenopathy is noted about the head, neck, axilla, or inguinal areas.   Neuro: Strength, tone  normal.         Assessment & Plan:  #1 solar urticaria  Plan: See orders and recommendations. If symptoms persist; allergy evaluation.

## 2014-07-30 NOTE — Progress Notes (Signed)
Pre visit review using our clinic review tool, if applicable. No additional management support is needed unless otherwise documented below in the visit note. 

## 2014-07-30 NOTE — Patient Instructions (Addendum)
Hives are generally idiopathic or without proven cause.Causes can be foods, sun, heat, cold or viruses. Sun exposure appears to be your trigger. Use OTC benadryl @ bedtime.

## 2014-08-30 NOTE — Progress Notes (Signed)
Cardiology Office Note   Date:  08/31/2014   ID:  Eric Dickerson, DOB 16-Jan-1951, MRN [id number]  PCP:  Unice Cobble, MD  Cardiologist:  Dr. Jenkins Rouge   PV:  Dr. Kathlyn Sacramento   Chief Complaint  Patient presents with  . Coronary Artery Disease     History of Present Illness: Eric Dickerson is a 64 y.o. male with a hx of CAD, s/p Cypher DES to the ramus and Promus DES x 2 to the RCA 08/2007 (minimal disease in LAD and CFX at that time), PAD, HL, HTN. He developed progressively worsening chest pain in 3/14 and LHC demonstrated 3 v CAD.  He was referred for CABG by Dr. Roxan Hockey 04/22/12 (LIMA-LAD, free RIMA-OM1).    Last seen by Dr. Jenkins Rouge 8/14.   Returns for FU.  Here alone.  Still working at [employer].  No chest pain, shortness of breath, syncope, orthopnea, PND, edema.  He has a tired feeling in his L leg with activity.  Also has L knee swelling and pain c/w DJD.  Has been told his DJD is not that bad.     Studies/Reports Reviewed Today:  Myoview 8/10: EF 63%, inf thinning, small prior ant infarct, no ischemia.   LHC 04/15/12: Distal left main 20%, ostial LAD 80%, mid LAD 40% multiple discrete lesions, ramus intermediate patent stent, ostial circumflex 95%, proximal and mid RCA stents patent, PDA 40%, EF 55%.  Preop carotid Dopplers were negative for significant ICA stenosis bilaterally. ABIs: Right 0.92, left 0.61.   Past Medical History  Diagnosis Date  . CAD (coronary artery disease)     a. CAD,  b. s/p Cypher DES to the ramus and Promus DES x 2 to the RCA 08/2007 (minimal disease in LAD and CFX at that time), c. Myoview 8/10: EF 63%, inf thinning, small prior ant infarct, no ischemia;  d. s/p CABG 3/14 (L-LAD, RIMA-OM1)  . Other abnormal glucose   . Hyperlipidemia     statin intolerant  . HTN (hypertension)   . ERECTILE DYSFUNCTION   . GERD   . HIATAL HERNIA   . DIVERTICULOSIS, COLON   . HYPERPLASIA PROSTATE UNS W/O UR OBST & OTH LUTS   . DEGENERATIVE JOINT  DISEASE, RIGHT HIP   . SPINAL STENOSIS, LUMBAR   . LUMBAR RADICULOPATHY, RIGHT   . ANEMIA, MILD   . PAD (peripheral artery disease)     pre CABG 04/2012 => ABIs: Right 0.92, left 0.61    Past Surgical History  Procedure Laterality Date  . Coronary stent placement  2010     X 2  . Total hip arthroplasty  2009  . Colonoscopy  2002    negative  . Coronary artery bypass graft N/A 04/22/2012    Procedure: CORONARY ARTERY BYPASS GRAFTING (CABG);  Surgeon: [name], MD;  Location: [facility];  Service: Open Heart Surgery;  Laterality: N/A;  CABG X 2, BIMA, POSSIBLE EVH  . Tee without cardioversion N/A 04/22/2012    Procedure: TRANSESOPHAGEAL ECHOCARDIOGRAM (TEE);  Surgeon: [name], MD;  Location: [facility];  Service: Open Heart Surgery;  Laterality: N/A;     Current Outpatient Prescriptions  Medication Sig Dispense Refill  . amLODipine (NORVASC) 10 MG tablet TAKE ONE TABLET BY MOUTH ONCE DAILY 30 tablet 1  . clopidogrel (PLAVIX) 75 MG tablet Take 1 tablet (75 mg total) by mouth daily. 30 tablet 1  . metoprolol tartrate (LOPRESSOR) 25 MG tablet Take 25 mg by mouth daily.    Marland Kitchen  Multiple Vitamin (MULTIVITAMIN WITH MINERALS) TABS Take 1 tablet by mouth daily. Centrum silver    . NITROSTAT 0.4 MG SL tablet Place 0.4 mg under the tongue every 5 (five) minutes as needed.     . ranitidine (ZANTAC) 150 MG capsule Take 150 mg by mouth 2 (two) times daily as needed for heartburn.    . ezetimibe (ZETIA) 10 MG tablet Take 1 tablet (10 mg total) by mouth daily. 30 tablet 11  . metoprolol succinate (TOPROL-XL) 25 MG 24 hr tablet Take 1 tablet (25 mg total) by mouth daily. 30 tablet 11   No current facility-administered medications for this visit.    Allergies:   Olmesartan medoxomil; Quinapril hcl; Glucosamine; Livalo; Rosuvastatin; and Verapamil    Social History:  The patient  reports that he quit smoking about 29 years ago. He does not have any smokeless tobacco history on file. He  reports that he does not drink alcohol or use illicit drugs.   Family History:  The patient's family history includes Diabetes in his maternal grandmother; Heart attack (age of onset: 48) in his father; Heart attack (age of onset: 1) in his brother; Hyperlipidemia in his brother and mother; Hypertension in his father; Pulmonary embolism in his brother; Stroke in his brother. There is no history of Cancer or COPD.    ROS:   Please see the history of present illness.   Review of Systems  All other systems reviewed and are negative.     PHYSICAL EXAM: VS:  BP 130/90 mmHg  Pulse 70  Ht 6\' 2"  (1.88 m)  Wt 208 lb (94.348 kg)  BMI 26.69 kg/m2    Wt Readings from Last 3 Encounters:  08/31/14 208 lb (94.348 kg)  07/30/14 209 lb (94.802 kg)  10/28/13 203 lb (92.08 kg)     GEN: Well nourished, well developed, in no acute distress HEENT: normal Neck: no JVD, no carotid bruits, no masses Cardiac:  Normal S1/S2, RRR; no murmur ,  no rubs or gallops, no edema   Respiratory:  clear to auscultation bilaterally, no wheezing, rhonchi or rales. GI: soft, nontender, nondistended, + BS MS: no deformity or atrophy Skin: warm and dry  Neuro:  CNs II-XII intact, Strength and sensation are intact Psych: Normal affect   EKG:  EKG is ordered today.  It demonstrates:   NSR, HR 70, normal axis, RBBB, no change from prior tracing   Recent Labs: 10/30/2013: ALT 18; BUN 14; Creatinine, Ser 1.0; Hemoglobin 12.8*; Platelets 249.0; Potassium 4.3; Sodium 138; TSH 1.91    Lipid Panel    Component Value Date/Time   CHOL 230* 10/30/2013 1034   TRIG 52.0 10/30/2013 1034   HDL 53.30 10/30/2013 1034   CHOLHDL 4 10/30/2013 1034   VLDL 10.4 10/30/2013 1034   LDLCALC 166* 10/30/2013 1034   LDLDIRECT 130.4 11/17/2009 0853      ASSESSMENT AND PLAN:  Coronary artery disease involving native coronary artery of native heart without angina pectoris:  No angina.  He cannot tolerate ASA 2/2 GERD.  Continue  Plavix.  He cannot tolerate statins.  Continue beta-blocker.   Essential hypertension:  Borderline control.  He only takes Metoprolol Tartrate 25 mg QPM.  He has fatigue when he takes it Twice daily. I will change Metoprolol Tartrate to Toprol-XL 25 mg QD.    Hyperlipidemia:  Intol to statins.  Try Zetia 10 mg QD (IMPROVE-IT study).  If he has intol to Zetia, refer to Lipid Clinic for PCSK-9 inhibitor.  PAD (peripheral artery disease):  He is having L leg "tiredness."  Suspect claudication.  Also has knee DJD.  Will get ABIs.  If significantly abnormal on L, refer to PV.     Medication Changes: Current medicines are reviewed at length with the patient today.  Concerns regarding medicines are as outlined above.  The following changes have been made:   Discontinued Medications   METOPROLOL TARTRATE (LOPRESSOR) 25 MG TABLET    Take 1 tablet (25 mg total) by mouth 2 (two) times daily.   PREDNISONE (DELTASONE) 10 MG TABLET    1 tid pc   RANITIDINE (ZANTAC) 150 MG TABLET    Take 1 tablet (150 mg total) by mouth 2 (two) times daily.   Modified Medications   No medications on file   New Prescriptions   EZETIMIBE (ZETIA) 10 MG TABLET    Take 1 tablet (10 mg total) by mouth daily.   METOPROLOL SUCCINATE (TOPROL-XL) 25 MG 24 HR TABLET    Take 1 tablet (25 mg total) by mouth daily.     Labs/ tests ordered today include:   Orders Placed This Encounter  Procedures  . Basic Metabolic Panel (BMET)  . Hepatic function panel  . Lipid Profile  . EKG 12-Lead     Disposition:   FU with Dr. Jenkins Rouge 1 year.   Signed, Versie Starks, MHS 08/31/2014 5:10 PM    [facility] [address], [place], [place]  [address] Phone: [phone]; Fax: [phone]

## 2014-08-31 ENCOUNTER — Other Ambulatory Visit: Payer: Self-pay | Admitting: Physician Assistant

## 2014-08-31 ENCOUNTER — Ambulatory Visit (INDEPENDENT_AMBULATORY_CARE_PROVIDER_SITE_OTHER): Payer: BC Managed Care – PPO | Admitting: Physician Assistant

## 2014-08-31 ENCOUNTER — Encounter: Payer: Self-pay | Admitting: Physician Assistant

## 2014-08-31 VITALS — BP 130/90 | HR 70 | Ht 74.0 in | Wt 208.0 lb

## 2014-08-31 DIAGNOSIS — I739 Peripheral vascular disease, unspecified: Secondary | ICD-10-CM | POA: Diagnosis not present

## 2014-08-31 DIAGNOSIS — E785 Hyperlipidemia, unspecified: Secondary | ICD-10-CM

## 2014-08-31 DIAGNOSIS — I2510 Atherosclerotic heart disease of native coronary artery without angina pectoris: Secondary | ICD-10-CM

## 2014-08-31 DIAGNOSIS — I10 Essential (primary) hypertension: Secondary | ICD-10-CM | POA: Diagnosis not present

## 2014-08-31 MED ORDER — EZETIMIBE 10 MG PO TABS: 10.0000 mg | ORAL_TABLET | Freq: Every day | ORAL | Status: DC

## 2014-08-31 MED ORDER — METOPROLOL SUCCINATE ER 25 MG PO TB24
25.0000 mg | ORAL_TABLET | Freq: Every day | ORAL | Status: DC
Start: 2014-08-31 — End: 2015-08-17

## 2014-08-31 NOTE — Patient Instructions (Signed)
Medication Instructions:  1. FINISH BOTTLE OF METOPROLOL TARTRATE  2. THEN START TOPROL XL 25 MG DAILY ONCE DONE WITH TARTRATE   Labwork: 6-8 WEEKS FOR FASTING LIPID AND LIVER PANEL AND BMET  Testing/Procedures: Your physician has requested that you have a lower extremity arterial duplex WITH ABI'S. Allow one hour for Lower and Upper Arterial scans. There are no restrictions or special instructions   Follow-Up: Your physician wants you to follow-up in: [place] Eric Dickerson will receive a reminder letter in the mail two months in advance. If you don't receive a letter, please call our office to schedule the follow-up appointment.   Any Other Special Instructions Will Be Listed Below (If Applicable).

## 2014-09-02 ENCOUNTER — Ambulatory Visit (HOSPITAL_COMMUNITY): Payer: BC Managed Care – PPO | Attending: Cardiology

## 2014-09-02 DIAGNOSIS — E785 Hyperlipidemia, unspecified: Secondary | ICD-10-CM | POA: Diagnosis not present

## 2014-09-02 DIAGNOSIS — I10 Essential (primary) hypertension: Secondary | ICD-10-CM | POA: Insufficient documentation

## 2014-09-02 DIAGNOSIS — I2510 Atherosclerotic heart disease of native coronary artery without angina pectoris: Secondary | ICD-10-CM | POA: Diagnosis not present

## 2014-09-02 DIAGNOSIS — Z87891 Personal history of nicotine dependence: Secondary | ICD-10-CM | POA: Insufficient documentation

## 2014-09-02 DIAGNOSIS — I739 Peripheral vascular disease, unspecified: Secondary | ICD-10-CM | POA: Insufficient documentation

## 2014-09-03 ENCOUNTER — Encounter: Payer: Self-pay | Admitting: Physician Assistant

## 2014-09-03 ENCOUNTER — Telehealth: Payer: Self-pay | Admitting: *Deleted

## 2014-09-03 NOTE — Telephone Encounter (Signed)
Pt notified of ABI results by phone with verbal understanding. Pt aware no further testing needed.

## 2014-09-25 ENCOUNTER — Other Ambulatory Visit: Payer: Self-pay | Admitting: Cardiovascular Disease

## 2014-10-23 ENCOUNTER — Other Ambulatory Visit (INDEPENDENT_AMBULATORY_CARE_PROVIDER_SITE_OTHER): Payer: BC Managed Care – PPO | Admitting: *Deleted

## 2014-10-23 ENCOUNTER — Telehealth: Payer: Self-pay | Admitting: *Deleted

## 2014-10-23 DIAGNOSIS — I10 Essential (primary) hypertension: Secondary | ICD-10-CM | POA: Diagnosis not present

## 2014-10-23 DIAGNOSIS — E785 Hyperlipidemia, unspecified: Secondary | ICD-10-CM

## 2014-10-23 DIAGNOSIS — I2510 Atherosclerotic heart disease of native coronary artery without angina pectoris: Secondary | ICD-10-CM

## 2014-10-23 DIAGNOSIS — E782 Mixed hyperlipidemia: Secondary | ICD-10-CM

## 2014-10-23 LAB — HEPATIC FUNCTION PANEL
ALBUMIN: 3.8 g/dL (ref 3.5–5.2)
ALK PHOS: 71 U/L (ref 39–117)
ALT: 14 U/L (ref 0–53)
AST: 18 U/L (ref 0–37)
Bilirubin, Direct: 0.1 mg/dL (ref 0.0–0.3)
TOTAL PROTEIN: 7.5 g/dL (ref 6.0–8.3)
Total Bilirubin: 0.3 mg/dL (ref 0.2–1.2)

## 2014-10-23 LAB — BASIC METABOLIC PANEL
BUN: 22 mg/dL (ref 6–23)
CHLORIDE: 105 meq/L (ref 96–112)
CO2: 28 meq/L (ref 19–32)
Calcium: 9.6 mg/dL (ref 8.4–10.5)
Creatinine, Ser: 1.21 mg/dL (ref 0.40–1.50)
GFR: 77.35 mL/min (ref >60.00)
GLUCOSE: 87 mg/dL (ref 70–99)
POTASSIUM: 4 meq/L (ref 3.5–5.1)
SODIUM: 140 meq/L (ref 135–145)

## 2014-10-23 LAB — LIPID PANEL
CHOL/HDL RATIO: 4
Cholesterol: 181 mg/dL (ref 0–200)
HDL: 50.6 mg/dL (ref >39.00)
LDL Cholesterol: 116 mg/dL — ABNORMAL HIGH (ref 0–99)
NONHDL: 130.35
Triglycerides: 70 mg/dL (ref 0.0–149.0)
VLDL: 14 mg/dL (ref 0.0–40.0)

## 2014-10-23 NOTE — Addendum Note (Signed)
Addended by: Eulis Foster on: 10/23/2014 07:39 AM   Modules accepted: Orders

## 2014-10-23 NOTE — Telephone Encounter (Signed)
Pt notified of lab results. Per [place] recommendation to lipid clinic; pt states he would like to keep trying Zetia since he has not been on it a long time yet. I advised let's recheck L/L 3 months, I will d/w PA of pt;s wishes.

## 2014-10-27 ENCOUNTER — Other Ambulatory Visit: Payer: Self-pay | Admitting: Internal Medicine

## 2015-01-29 ENCOUNTER — Other Ambulatory Visit (INDEPENDENT_AMBULATORY_CARE_PROVIDER_SITE_OTHER): Payer: BC Managed Care – PPO | Admitting: *Deleted

## 2015-01-29 DIAGNOSIS — I2510 Atherosclerotic heart disease of native coronary artery without angina pectoris: Secondary | ICD-10-CM | POA: Diagnosis not present

## 2015-01-29 DIAGNOSIS — E782 Mixed hyperlipidemia: Secondary | ICD-10-CM

## 2015-01-29 LAB — HEPATIC FUNCTION PANEL
ALT: 14 U/L (ref 9–46)
AST: 16 U/L (ref 10–35)
Albumin: 3.5 g/dL — ABNORMAL LOW (ref 3.6–5.1)
Alkaline Phosphatase: 70 U/L (ref 40–115)
BILIRUBIN DIRECT: 0.1 mg/dL (ref <=0.2)
BILIRUBIN TOTAL: 0.3 mg/dL (ref 0.2–1.2)
Indirect Bilirubin: 0.2 mg/dL (ref 0.2–1.2)
Total Protein: 6.6 g/dL (ref 6.1–8.1)

## 2015-01-29 LAB — LIPID PANEL
CHOL/HDL RATIO: 3.9 ratio (ref <=5.0)
Cholesterol: 196 mg/dL (ref 125–200)
HDL: 50 mg/dL (ref >=40)
LDL Cholesterol: 132 mg/dL — ABNORMAL HIGH (ref <130)
Triglycerides: 68 mg/dL (ref <150)
VLDL: 14 mg/dL (ref <30)

## 2015-01-29 NOTE — Addendum Note (Signed)
Addended by: Eulis Foster on: 01/29/2015 07:33 AM   Modules accepted: Orders

## 2015-01-29 NOTE — Addendum Note (Signed)
Addended by: Eulis Foster on: 01/29/2015 07:32 AM   Modules accepted: Orders

## 2015-02-03 ENCOUNTER — Telehealth: Payer: Self-pay | Admitting: *Deleted

## 2015-02-03 DIAGNOSIS — I2510 Atherosclerotic heart disease of native coronary artery without angina pectoris: Secondary | ICD-10-CM

## 2015-02-03 DIAGNOSIS — E782 Mixed hyperlipidemia: Secondary | ICD-10-CM

## 2015-02-03 NOTE — Telephone Encounter (Signed)
Pt notified of lab results and findings. Pt is agreeable to referral to Lipid Clinic. I will have [facility] call pt with the date and time of appt for Lipid Clinic.

## 2015-02-10 ENCOUNTER — Other Ambulatory Visit: Payer: Self-pay | Admitting: *Deleted

## 2015-02-10 DIAGNOSIS — I10 Essential (primary) hypertension: Secondary | ICD-10-CM

## 2015-02-10 DIAGNOSIS — I2510 Atherosclerotic heart disease of native coronary artery without angina pectoris: Secondary | ICD-10-CM

## 2015-03-19 ENCOUNTER — Ambulatory Visit (INDEPENDENT_AMBULATORY_CARE_PROVIDER_SITE_OTHER): Payer: BC Managed Care – PPO | Admitting: Nurse Practitioner

## 2015-03-19 ENCOUNTER — Encounter: Payer: Self-pay | Admitting: Nurse Practitioner

## 2015-03-19 VITALS — BP 114/76 | HR 76 | Temp 98.7°F | Resp 16 | Wt 207.0 lb

## 2015-03-19 DIAGNOSIS — J302 Other seasonal allergic rhinitis: Secondary | ICD-10-CM | POA: Diagnosis not present

## 2015-03-19 MED ORDER — FLUTICASONE PROPIONATE 50 MCG/ACT NA SUSP: 2.0000 | Freq: Every day | NASAL | Status: DC

## 2015-03-19 NOTE — Progress Notes (Signed)
Patient ID: Eric Dickerson, male    DOB: 1950-03-29  Age: 64 y.o. MRN: [id number]  CC: Cough   HPI Eric Dickerson presents for CC of cough x 1 month.   1) Cough, clear mucous, no other symptoms  PNDrip   Treatment : Alkaseltzer cough/cold  Tylenol sinus   Sick contacts: Denies   History Eric Dickerson has a past medical history of CAD (coronary artery disease); Other abnormal glucose; Hyperlipidemia; HTN (hypertension); ERECTILE DYSFUNCTION; GERD; HIATAL HERNIA; DIVERTICULOSIS, COLON; HYPERPLASIA PROSTATE UNS W/O UR OBST & OTH LUTS; DEGENERATIVE JOINT DISEASE, RIGHT HIP; SPINAL STENOSIS, LUMBAR; LUMBAR RADICULOPATHY, RIGHT; ANEMIA, MILD; and PAD (peripheral artery disease) ([place]).   Eric Dickerson has past surgical history that includes Coronary stent placement (2010); Total hip arthroplasty (2009); Colonoscopy (2002); Coronary artery bypass graft (N/A, 04/22/2012); and TEE without cardioversion (N/A, 04/22/2012).   His family history includes Diabetes in his maternal grandmother; Heart attack (age of onset: 57) in his father; Heart attack (age of onset: 40) in his brother; Hyperlipidemia in his brother and mother; Hypertension in his father; Pulmonary embolism in his brother; Stroke in his brother. There is no history of Cancer or COPD.Eric Dickerson reports that Eric Dickerson quit smoking about 30 years ago. Eric Dickerson does not have any smokeless tobacco history on file. Eric Dickerson reports that Eric Dickerson does not drink alcohol or use illicit drugs.  Outpatient Prescriptions Prior to Visit  Medication Sig Dispense Refill  . metoprolol succinate (TOPROL-XL) 25 MG 24 hr tablet Take 1 tablet (25 mg total) by mouth daily. 30 tablet 11  . Multiple Vitamin (MULTIVITAMIN WITH MINERALS) TABS Take 1 tablet by mouth daily. Centrum silver    . NITROSTAT 0.4 MG SL tablet Place 0.4 mg under the tongue every 5 (five) minutes as needed.     . ranitidine (ZANTAC) 150 MG capsule Take 150 mg by mouth 2 (two) times daily as needed for heartburn.    Marland Kitchen amLODipine (NORVASC) 10 MG  tablet TAKE ONE TABLET BY MOUTH ONCE DAILY 30 tablet 6  . clopidogrel (PLAVIX) 75 MG tablet Take 1 tablet (75 mg total) by mouth daily. 30 tablet 1  . ezetimibe (ZETIA) 10 MG tablet Take 1 tablet (10 mg total) by mouth daily. 30 tablet 11   No facility-administered medications prior to visit.    ROS Review of Systems  Constitutional: Negative for fever, chills, diaphoresis and fatigue.  HENT: Positive for congestion, postnasal drip and rhinorrhea. Negative for sinus pressure, sneezing and sore throat.   Eyes: Negative for visual disturbance.  Respiratory: Positive for cough. Negative for chest tightness, shortness of breath and wheezing.   Cardiovascular: Negative for chest pain, palpitations and leg swelling.  Neurological: Negative for dizziness.    Objective:  BP 114/76 mmHg  Pulse 76  Temp(Src) 98.7 F (37.1 C) (Oral)  Resp 16  Wt 207 lb (93.895 kg)  SpO2 97%  Physical Exam  Constitutional: Eric Dickerson is oriented to person, place, and time. Eric Dickerson appears well-developed and well-nourished. No distress.  HENT:  Head: Normocephalic and atraumatic.  Right Ear: External ear normal.  Left Ear: External ear normal.  Mouth/Throat: No oropharyngeal exudate.  TM's clear bilaterally  Eyes: EOM are normal. Pupils are equal, round, and reactive to light. Right eye exhibits no discharge. Left eye exhibits no discharge. No scleral icterus.  Neck: Normal range of motion. Neck supple.  Cardiovascular: Normal rate, regular rhythm and normal heart sounds.  Exam reveals no gallop and no friction rub.   No murmur heard. Pulmonary/Chest: Effort normal  and breath sounds normal. No respiratory distress. Eric Dickerson has no wheezes. Eric Dickerson has no rales. Eric Dickerson exhibits no tenderness.  Lymphadenopathy:    Eric Dickerson has no cervical adenopathy.  Neurological: Eric Dickerson is alert and oriented to person, place, and time.  Skin: Skin is warm and dry. No rash noted. Eric Dickerson is not diaphoretic.  Psychiatric: Eric Dickerson has a normal mood and affect. His  behavior is normal. Judgment and thought content normal.   Assessment & Plan:   Eric Dickerson was seen today for cough.  Diagnoses and all orders for this visit:  Other seasonal allergic rhinitis  Other orders -     Discontinue: fluticasone (FLONASE) 50 MCG/ACT nasal spray; Place 2 sprays into both nostrils daily. -     fluticasone (FLONASE) 50 MCG/ACT nasal spray; Place 2 sprays into both nostrils daily.  I have discontinued Eric Dickerson clopidogrel and ezetimibe. I am also having him maintain his multivitamin with minerals, NITROSTAT, ranitidine, metoprolol succinate, and fluticasone.  Meds ordered this encounter  Medications  . DISCONTD: fluticasone (FLONASE) 50 MCG/ACT nasal spray    Sig: Place 2 sprays into both nostrils daily.    Dispense:  16 g    Refill:  6    Order Specific Question:  Supervising Provider    Answer:  Deborra Medina L [[id number]]  . fluticasone (FLONASE) 50 MCG/ACT nasal spray    Sig: Place 2 sprays into both nostrils daily.    Dispense:  16 g    Refill:  6    Order Specific Question:  Supervising Provider    Answer:  Crecencio Mc [[id number]]     Follow-up: Return if symptoms worsen or fail to improve.

## 2015-03-19 NOTE — Progress Notes (Signed)
Pre visit review using our clinic review tool, if applicable. No additional management support is needed unless otherwise documented below in the visit note. 

## 2015-03-19 NOTE — Patient Instructions (Signed)
Flonase- 2 sprays in each nostril once daily  Allegra/Zyrtec/ or Claritin to help with other symptoms (pick one over the counter).   Allergic Rhinitis Allergic rhinitis is when the mucous membranes in the nose respond to allergens. Allergens are particles in the air that cause your body to have an allergic reaction. This causes you to release allergic antibodies. Through a chain of events, these eventually cause you to release histamine into the blood stream. Although meant to protect the body, it is this release of histamine that causes your discomfort, such as frequent sneezing, congestion, and an itchy, runny nose.  CAUSES Seasonal allergic rhinitis (hay fever) is caused by pollen allergens that may come from grasses, trees, and weeds. Year-round allergic rhinitis (perennial allergic rhinitis) is caused by allergens such as house dust mites, pet dander, and mold spores. SYMPTOMS  Nasal stuffiness (congestion).  Itchy, runny nose with sneezing and tearing of the eyes. DIAGNOSIS Your health care provider can help you determine the allergen or allergens that trigger your symptoms. If you and your health care provider are unable to determine the allergen, skin or blood testing may be used. Your health care provider will diagnose your condition after taking your health history and performing a physical exam. Your health care provider may assess you for other related conditions, such as asthma, pink eye, or an ear infection. TREATMENT Allergic rhinitis does not have a cure, but it can be controlled by:  Medicines that block allergy symptoms. These may include allergy shots, nasal sprays, and oral antihistamines.  Avoiding the allergen. Hay fever may often be treated with antihistamines in pill or nasal spray forms. Antihistamines block the effects of histamine. There are over-the-counter medicines that may help with nasal congestion and swelling around the eyes. Check with your health care provider  before taking or giving this medicine. If avoiding the allergen or the medicine prescribed do not work, there are many new medicines your health care provider can prescribe. Stronger medicine may be used if initial measures are ineffective. Desensitizing injections can be used if medicine and avoidance does not work. Desensitization is when a patient is given ongoing shots until the body becomes less sensitive to the allergen. Make sure you follow up with your health care provider if problems continue. HOME CARE INSTRUCTIONS It is not possible to completely avoid allergens, but you can reduce your symptoms by taking steps to limit your exposure to them. It helps to know exactly what you are allergic to so that you can avoid your specific triggers. SEEK MEDICAL CARE IF:  You have a fever.  You develop a cough that does not stop easily (persistent).  You have shortness of breath.  You start wheezing.  Symptoms interfere with normal daily activities.   This information is not intended to replace advice given to you by your health care provider. Make sure you discuss any questions you have with your health care provider.   Document Released: 10/18/2000 Document Revised: 02/13/2014 Document Reviewed: 09/30/2012 Elsevier Interactive Patient Education [employer].

## 2015-03-22 ENCOUNTER — Other Ambulatory Visit: Payer: Self-pay | Admitting: Cardiovascular Disease

## 2015-03-22 MED ORDER — AMLODIPINE BESYLATE 10 MG PO TABS: 10.0000 mg | ORAL_TABLET | Freq: Every day | ORAL | Status: DC

## 2015-03-23 DIAGNOSIS — J309 Allergic rhinitis, unspecified: Secondary | ICD-10-CM | POA: Insufficient documentation

## 2015-03-23 NOTE — Assessment & Plan Note (Signed)
Cough and mucous seem to be allergic in nature  Flonase sent to pharmacy  Advised follow up if any symptoms change

## 2015-03-26 NOTE — Progress Notes (Signed)
Patient ID: Eric Dickerson, male   DOB: 1950-08-29, 65 y.o.   MRN: [id number]    Cardiology Office Note   Date:  04/02/2015   ID:  Eric Dickerson, DOB 1950/09/05, MRN [id number]  PCP:  Mauricio Po, FNP  Cardiologist:  Dr. Jenkins Rouge   PV:  Dr. Kathlyn Sacramento   Chief Complaint  Patient presents with  . atherosclerosis of native coronary artery of native heart wi    no sx     History of Present Illness: Eric Dickerson is a 65 y.o. male with a hx of CAD, s/p Cypher DES to the ramus and Promus DES x 2 to the RCA 08/2007 (minimal disease in LAD and CFX at that time), PAD, HL, HTN. He developed progressively worsening chest pain in 3/14 and LHC demonstrated 3 v CAD.  He was referred for CABG by Dr. Roxan Hockey 04/22/12 (LIMA-LAD, free RIMA-OM1).    Last seen by me  8/14.   Returns for FU.  Here alone.  Still working at [employer].  No chest pain, shortness of breath, syncope, orthopnea, PND, edema.    Does have some claudication in LLE   Studies/Reports Reviewed Dickerson:  Myoview 8/10: EF 63%, inf thinning, small prior ant infarct, no ischemia.   LHC 04/15/12: Distal left main 20%, ostial LAD 80%, mid LAD 40% multiple discrete lesions, ramus intermediate patent stent, ostial circumflex 95%, proximal and mid RCA stents patent, PDA 40%, EF 55%.  Preop carotid Dopplers were negative for significant ICA stenosis bilaterally.  09/01/14   ABIs: Right 0.92, left 0.82.  Discussed with Eric Dickerson alternative for cholesterol meds and he will try crestor 20 mg    Past Medical History  Diagnosis Date  . CAD (coronary artery disease)     a. CAD,  b. s/p Cypher DES to the ramus and Promus DES x 2 to the RCA 08/2007 (minimal disease in LAD and CFX at that time), c. Myoview 8/10: EF 63%, inf thinning, small prior ant infarct, no ischemia;  d. s/p CABG 3/14 (L-LAD, RIMA-OM1)  . Other abnormal glucose   . Hyperlipidemia     statin intolerant  . HTN (hypertension)   . ERECTILE DYSFUNCTION   . GERD   .  HIATAL HERNIA   . DIVERTICULOSIS, COLON   . HYPERPLASIA PROSTATE UNS W/O UR OBST & OTH LUTS   . DEGENERATIVE JOINT DISEASE, RIGHT HIP   . SPINAL STENOSIS, LUMBAR   . LUMBAR RADICULOPATHY, RIGHT   . ANEMIA, MILD   . PAD (peripheral artery disease) ([place])     pre CABG 04/2012 => ABIs: Right 0.92, left 0.61 // ABI 09/02/14: R 0.93; L 0.82    Past Surgical History  Procedure Laterality Date  . Coronary stent placement  2010     X 2  . Total hip arthroplasty  2009  . Colonoscopy  2002    negative  . Coronary artery bypass graft N/A 04/22/2012    Procedure: CORONARY ARTERY BYPASS GRAFTING (CABG);  Surgeon: [name], MD;  Location: [place];  Service: Open Heart Surgery;  Laterality: N/A;  CABG X 2, BIMA, POSSIBLE EVH  . Tee without cardioversion N/A 04/22/2012    Procedure: TRANSESOPHAGEAL ECHOCARDIOGRAM (TEE);  Surgeon: [name], MD;  Location: [place];  Service: Open Heart Surgery;  Laterality: N/A;     Current Outpatient Prescriptions  Medication Sig Dispense Refill  . amLODipine (NORVASC) 10 MG tablet Take 1 tablet (10 mg total) by mouth daily. 30 tablet  6  . fluticasone (FLONASE) 50 MCG/ACT nasal spray Place 2 sprays into both nostrils daily. 16 g 6  . metoprolol succinate (TOPROL-XL) 25 MG 24 hr tablet Take 1 tablet (25 mg total) by mouth daily. 30 tablet 11  . Multiple Vitamin (MULTIVITAMIN WITH MINERALS) TABS Take 1 tablet by mouth daily. Centrum silver    . NITROSTAT 0.4 MG SL tablet Place 0.4 mg under the tongue every 5 (five) minutes as needed for chest pain (3 doses max).     . ranitidine (ZANTAC) 150 MG capsule Take 150 mg by mouth 2 (two) times daily as needed for heartburn.    . tamsulosin (FLOMAX) 0.4 MG CAPS capsule Take 0.4 mg by mouth daily.    . rosuvastatin (CRESTOR) 20 MG tablet Take 1 tablet (20 mg total) by mouth daily. 30 tablet 1   No current facility-administered medications for this visit.    Allergies:   Olmesartan medoxomil; Quinapril hcl;  Glucosamine; Livalo; Rosuvastatin; and Verapamil    Social History:  The patient  reports that he quit smoking about 30 years ago. He has never used smokeless tobacco. He reports that he does not drink alcohol or use illicit drugs.   Family History:  The patient's family history includes Diabetes in his maternal grandmother; Heart attack (age of onset: 24) in his father; Heart attack (age of onset: 55) in his brother; Hyperlipidemia in his brother and mother; Hypertension in his father; Pulmonary embolism in his brother; Stroke in his brother. There is no history of Cancer or COPD.    ROS:   Please see the history of present illness.   Review of Systems  All other systems reviewed and are negative.     PHYSICAL EXAM: VS:  BP 110/70 mmHg  Pulse 98  Ht 6\' 2"  (1.88 m)  Wt 93.441 kg (206 lb)  BMI 26.44 kg/m2  SpO2 97%    Wt Readings from Last 3 Encounters:  04/02/15 93.441 kg (206 lb)  03/29/15 93.895 kg (207 lb)  03/19/15 93.895 kg (207 lb)     GEN: Well nourished, well developed, in no acute distress HEENT: normal Neck: no JVD, no carotid bruits, no masses Cardiac:  Normal S1/S2, RRR; no murmur ,  no rubs or gallops, no edema   Respiratory:  clear to auscultation bilaterally, no wheezing, rhonchi or rales. GI: soft, nontender, nondistended, + BS MS: no deformity or atrophy Skin: warm and dry  Neuro:  CNs II-XII intact, Strength and sensation are intact Psych: Normal affect   EKG:  09/01/14   NSR, HR 70, normal axis, RBBB, no change from prior tracing   Recent Labs: 03/29/2015: ALT 15; BUN 17; Creatinine, Ser 1.15; Hemoglobin 12.3*; Platelets 324.0; Potassium 4.4; Sodium 141; TSH 1.71    Lipid Panel    Component Value Date/Time   CHOL 196 01/29/2015 0733   TRIG 68 01/29/2015 0733   HDL 50 01/29/2015 0733   CHOLHDL 3.9 01/29/2015 0733   VLDL 14 01/29/2015 0733   LDLCALC 132* 01/29/2015 0733   LDLDIRECT 130.4 11/17/2009 0853      ASSESSMENT AND PLAN:  Coronary  artery disease involving native coronary artery of native heart without angina pectoris:  No angina.  He cannot tolerate ASA 2/2 GERD.  Continue Plavix.    Continue beta-blocker.   Essential hypertension:  Borderline control.  He only takes Metoprolol Tartrate 25 mg QPM.  He has fatigue when he takes it Twice daily. I will change Metoprolol Tartrate to Toprol-XL 25 mg  QD.    Hyperlipidemia:  Intolerant to some statins and zetia Eric Dickerson and he will try crestor 20 mg and f/u with her in clinic   PAD (peripheral artery disease):   Left ABI .82 on July 2016      Medication Changes: crestor 20 mg    Labs/ tests ordered Dickerson include:   No orders of the defined types were placed in this encounter.     Disposition:   FU with me  1 year.   Jenkins Rouge

## 2015-03-29 ENCOUNTER — Other Ambulatory Visit (INDEPENDENT_AMBULATORY_CARE_PROVIDER_SITE_OTHER): Payer: BC Managed Care – PPO

## 2015-03-29 ENCOUNTER — Encounter: Payer: Self-pay | Admitting: Family

## 2015-03-29 ENCOUNTER — Ambulatory Visit (INDEPENDENT_AMBULATORY_CARE_PROVIDER_SITE_OTHER): Payer: BC Managed Care – PPO | Admitting: Family

## 2015-03-29 VITALS — BP 120/80 | HR 76 | Temp 98.2°F | Resp 16 | Ht 74.0 in | Wt 207.0 lb

## 2015-03-29 DIAGNOSIS — Z0001 Encounter for general adult medical examination with abnormal findings: Secondary | ICD-10-CM | POA: Insufficient documentation

## 2015-03-29 DIAGNOSIS — Z0000 Encounter for general adult medical examination without abnormal findings: Secondary | ICD-10-CM | POA: Insufficient documentation

## 2015-03-29 LAB — COMPREHENSIVE METABOLIC PANEL
ALT: 15 U/L (ref 0–53)
AST: 17 U/L (ref 0–37)
Albumin: 4 g/dL (ref 3.5–5.2)
Alkaline Phosphatase: 67 U/L (ref 39–117)
BUN: 17 mg/dL (ref 6–23)
CHLORIDE: 106 meq/L (ref 96–112)
CO2: 28 mEq/L (ref 19–32)
Calcium: 9.7 mg/dL (ref 8.4–10.5)
Creatinine, Ser: 1.15 mg/dL (ref 0.40–1.50)
GFR: 81.92 mL/min (ref >60.00)
GLUCOSE: 88 mg/dL (ref 70–99)
POTASSIUM: 4.4 meq/L (ref 3.5–5.1)
SODIUM: 141 meq/L (ref 135–145)
Total Bilirubin: 0.3 mg/dL (ref 0.2–1.2)
Total Protein: 6.9 g/dL (ref 6.0–8.3)

## 2015-03-29 LAB — CBC
HEMATOCRIT: 37.6 % — AB (ref 39.0–52.0)
Hemoglobin: 12.3 g/dL — ABNORMAL LOW (ref 13.0–17.0)
MCHC: 32.8 g/dL (ref 30.0–36.0)
MCV: 85.4 fl (ref 78.0–100.0)
Platelets: 324 10*3/uL (ref 150.0–400.0)
RBC: 4.4 Mil/uL (ref 4.22–5.81)
RDW: 14.8 % (ref 11.5–15.5)
WBC: 7.7 10*3/uL (ref 4.0–10.5)

## 2015-03-29 LAB — PSA: PSA: 3.69 ng/mL (ref 0.10–4.00)

## 2015-03-29 LAB — TSH: TSH: 1.71 u[IU]/mL (ref 0.35–4.50)

## 2015-03-29 LAB — HEPATITIS C ANTIBODY: HCV AB: NEGATIVE

## 2015-03-29 NOTE — Progress Notes (Signed)
Pre visit review using our clinic review tool, if applicable. No additional management support is needed unless otherwise documented below in the visit note. 

## 2015-03-29 NOTE — Progress Notes (Signed)
Subjective:    Patient ID: Eric Dickerson, male    DOB: 23-Apr-1950, 65 y.o.   MRN: [id number]  Chief Complaint  Patient presents with  . CPE    not fasting    HPI:  Eric Dickerson is a 65 y.o. male who presents today for an annual wellness visit.   1) Health Maintenance -   Diet -  Averages about 1 meal with several snacks consisting of chicken, [place], rare fruits and vegetables; 2 cups of caffeine daily  Exercise - No structured exercise; walking on job.    2) Preventative Exams / Immunizations:  Dental -- Up to date  Vision -- Up to date   Health Maintenance  Topic Date Due  . Hepatitis C Screening  1950-02-12  . HIV Screening  08/09/1964  . ZOSTAVAX  08/09/2009  . PNA vac Low Risk Adult (1 of 2 - PCV13) 08/10/2014  . INFLUENZA VACCINE  09/07/2015 (Originally 09/07/2014)  . COLONOSCOPY  06/04/2017  . TETANUS/TDAP  11/16/2019    Immunization History  Administered Date(s) Administered  . Td 11/15/2009   Allergies  Allergen Reactions  . Olmesartan Medoxomil Rash  . Quinapril Hcl Other (See Comments)     cough; ACE inhibitors are contraindicated because of a history of rash with Angiotensin receptor blocker.  . Glucosamine Other (See Comments)    Doesn't remember reaction  . Livalo [Pitavastatin]     cramps  . Rosuvastatin Other (See Comments)    : muscle cramps  . Verapamil Other (See Comments)    Extra sensitive to sun     Outpatient Prescriptions Prior to Visit  Medication Sig Dispense Refill  . amLODipine (NORVASC) 10 MG tablet Take 1 tablet (10 mg total) by mouth daily. 30 tablet 6  . fluticasone (FLONASE) 50 MCG/ACT nasal spray Place 2 sprays into both nostrils daily. 16 g 6  . metoprolol succinate (TOPROL-XL) 25 MG 24 hr tablet Take 1 tablet (25 mg total) by mouth daily. 30 tablet 11  . Multiple Vitamin (MULTIVITAMIN WITH MINERALS) TABS Take 1 tablet by mouth daily. Centrum silver    . NITROSTAT 0.4 MG SL tablet Place 0.4 mg under the tongue every  5 (five) minutes as needed.     . ranitidine (ZANTAC) 150 MG capsule Take 150 mg by mouth 2 (two) times daily as needed for heartburn.     No facility-administered medications prior to visit.     Past Medical History  Diagnosis Date  . CAD (coronary artery disease)     a. CAD,  b. s/p Cypher DES to the ramus and Promus DES x 2 to the RCA 08/2007 (minimal disease in LAD and CFX at that time), c. Myoview 8/10: EF 63%, inf thinning, small prior ant infarct, no ischemia;  d. s/p CABG 3/14 (L-LAD, RIMA-OM1)  . Other abnormal glucose   . Hyperlipidemia     statin intolerant  . HTN (hypertension)   . ERECTILE DYSFUNCTION   . GERD   . HIATAL HERNIA   . DIVERTICULOSIS, COLON   . HYPERPLASIA PROSTATE UNS W/O UR OBST & OTH LUTS   . DEGENERATIVE JOINT DISEASE, RIGHT HIP   . SPINAL STENOSIS, LUMBAR   . LUMBAR RADICULOPATHY, RIGHT   . ANEMIA, MILD   . PAD (peripheral artery disease) ([place])     pre CABG 04/2012 => ABIs: Right 0.92, left 0.61 // ABI 09/02/14: R 0.93; L 0.82     Past Surgical History  Procedure Laterality Date  . Coronary  stent placement  2010     X 2  . Total hip arthroplasty  2009  . Colonoscopy  2002    negative  . Coronary artery bypass graft N/A 04/22/2012    Procedure: CORONARY ARTERY BYPASS GRAFTING (CABG);  Surgeon: [name], MD;  Location: [place];  Service: Open Heart Surgery;  Laterality: N/A;  CABG X 2, BIMA, POSSIBLE EVH  . Tee without cardioversion N/A 04/22/2012    Procedure: TRANSESOPHAGEAL ECHOCARDIOGRAM (TEE);  Surgeon: [name], MD;  Location: [place];  Service: Open Heart Surgery;  Laterality: N/A;     Family History  Problem Relation Age of Onset  . Diabetes Maternal Grandmother   . Hypertension Father   . Heart attack Father 16    smoker  . Stroke Brother     2 brothers ; 50 & 51  . Hyperlipidemia Brother   . Pulmonary embolism Brother   . Hyperlipidemia Mother   . Cancer Neg Hx   . COPD Neg Hx   . Heart attack Brother 68      Social History   Social History  . Marital Status: Married    Spouse Name: N/A  . Number of Children: 4  . Years of Education: 12   Occupational History  . Facilities Management    Social History Main Topics  . Smoking status: Former Smoker -- 1.50 packs/day for 12 years    Quit date: 02/06/1985  . Smokeless tobacco: Never Used     Comment: smoked 1967-1987, up to 1.5 ppd  . Alcohol Use: No  . Drug Use: No  . Sexual Activity: Not on file   Other Topics Concern  . Not on file   Social History Narrative   Denies abuse and feels safe at home.     Review of Systems  Constitutional: Denies fever, chills, fatigue, or significant weight gain/loss. HENT: Head: Denies headache or neck pain Ears: Denies changes in hearing, ringing in ears, earache, drainage Nose: Denies discharge, stuffiness, itching, nosebleed, sinus pain Throat: Denies sore throat, hoarseness, dry mouth, sores, thrush Eyes: Denies loss/changes in vision, pain, redness, blurry/double vision, flashing lights Cardiovascular: Denies chest pain/discomfort, tightness, palpitations, shortness of breath with activity, difficulty lying down, swelling, sudden awakening with shortness of breath Respiratory: Denies shortness of breath, cough, sputum production, wheezing Gastrointestinal: Denies dysphasia, heartburn, change in appetite, nausea, change in bowel habits, rectal bleeding, constipation, diarrhea, yellow skin or eyes Genitourinary: Denies frequency, urgency, burning/pain, blood in urine, incontinence, change in urinary strength. Musculoskeletal: Denies muscle/joint pain, stiffness, back pain, redness or swelling of joints, trauma Skin: Denies rashes, lumps, itching, dryness, color changes, or hair/nail changes Neurological: Denies dizziness, fainting, seizures, weakness, numbness, tingling, tremor Psychiatric - Denies nervousness, stress, depression or memory loss Endocrine: Denies heat or cold intolerance,  sweating, frequent urination, excessive thirst, changes in appetite Hematologic: Denies ease of bruising or bleeding     Objective:     BP 120/80 mmHg  Pulse 76  Temp(Src) 98.2 F (36.8 C) (Oral)  Resp 16  Ht 6\' 2"  (1.88 m)  Wt 207 lb (93.895 kg)  BMI 26.57 kg/m2  SpO2 96% Nursing note and vital signs reviewed.  Physical Exam  Constitutional: He is oriented to person, place, and time. He appears well-developed and well-nourished.  HENT:  Head: Normocephalic.  Right Ear: Hearing, tympanic membrane, external ear and ear canal normal.  Left Ear: Hearing, tympanic membrane, external ear and ear canal normal.  Nose: Nose normal.  Mouth/Throat: Uvula is  midline, oropharynx is clear and moist and mucous membranes are normal.  Eyes: Conjunctivae and EOM are normal. Pupils are equal, round, and reactive to light.  Arcus Senillis noted bilaterally.   Neck: Neck supple. No JVD present. No tracheal deviation present. No thyromegaly present.  Cardiovascular: Normal rate, regular rhythm, normal heart sounds and intact distal pulses.   Pulmonary/Chest: Effort normal and breath sounds normal.  Abdominal: Soft. Bowel sounds are normal. He exhibits no distension and no mass. There is no tenderness. There is no rebound and no guarding.  Musculoskeletal: Normal range of motion. He exhibits no edema or tenderness.  Lymphadenopathy:    He has no cervical adenopathy.  Neurological: He is alert and oriented to person, place, and time. He has normal reflexes. No cranial nerve deficit. He exhibits normal muscle tone. Coordination normal.  Skin: Skin is warm and dry.  Psychiatric: He has a normal mood and affect. His behavior is normal. Judgment and thought content normal.       Assessment & Plan:   Problem List Items Addressed This Visit      Other   Routine general medical examination at a health care facility - Primary    1) Anticipatory Guidance: Discussed importance of wearing a seatbelt  while driving and not texting while driving; changing batteries in smoke detector at least once annually; wearing suntan lotion when outside; eating a balanced and moderate diet; getting physical activity at least 30 minutes per day.  2) Immunizations / Screenings / Labs:  Declines influenza, shingles and pneumococcal vaccinations. All other immunizations are up-to-date per recommendations. Obtain hepatitis C antibody for hepatitis C screening. Obtain PSA for prostate cancer screening. All other screenings are up-to-date per recommendations. Obtain CBC, BMET, Lipid profile and TSH.   Overall adequate exam with risk factors for cardiovascular disease including coronary artery disease, hyperlipidemia, hypertension, and peripheral artery disease. Chronic conditions are currently managed through medication. He is working with cardiology in the lipid clinic to help control his cholesterol levels as he has been intolerant to statin therapy. Encouraged nutritional intake that is moderate, varied, and balanced and focuses on nutrient dense foods and is low in saturated/processed sugary foods. Increase physical activity to 30 minutes of moderate level activity daily. Continue other healthy lifestyle behaviors and choices. I'll a prevention exam in 1 year. Follow-up office visit for chronic conditions pending blood work.       Relevant Orders   CBC (Completed)   Comprehensive metabolic panel (Completed)   PSA (Completed)   TSH (Completed)   Hepatitis C antibody

## 2015-03-29 NOTE — Assessment & Plan Note (Signed)
1) Anticipatory Guidance: Discussed importance of wearing a seatbelt while driving and not texting while driving; changing batteries in smoke detector at least once annually; wearing suntan lotion when outside; eating a balanced and moderate diet; getting physical activity at least 30 minutes per day.  2) Immunizations / Screenings / Labs:  Declines influenza, shingles and pneumococcal vaccinations. All other immunizations are up-to-date per recommendations. Obtain hepatitis C antibody for hepatitis C screening. Obtain PSA for prostate cancer screening. All other screenings are up-to-date per recommendations. Obtain CBC, BMET, Lipid profile and TSH.   Overall adequate exam with risk factors for cardiovascular disease including coronary artery disease, hyperlipidemia, hypertension, and peripheral artery disease. Chronic conditions are currently managed through medication. He is working with cardiology in the lipid clinic to help control his cholesterol levels as he has been intolerant to statin therapy. Encouraged nutritional intake that is moderate, varied, and balanced and focuses on nutrient dense foods and is low in saturated/processed sugary foods. Increase physical activity to 30 minutes of moderate level activity daily. Continue other healthy lifestyle behaviors and choices. I'll a prevention exam in 1 year. Follow-up office visit for chronic conditions pending blood work.

## 2015-03-29 NOTE — Patient Instructions (Signed)
Thank you for choosing [facility].  Summary/Instructions:  Health Maintenance, Male A healthy lifestyle and preventative care can promote health and wellness.  Maintain regular health, dental, and eye exams.  Eat a healthy diet. Foods like vegetables, fruits, whole grains, low-fat dairy products, and lean protein foods contain the nutrients you need and are low in calories. Decrease your intake of foods high in solid fats, added sugars, and salt. Get information about a proper diet from your health care provider, if necessary.  Regular physical exercise is one of the most important things you can do for your health. Most adults should get at least 150 minutes of moderate-intensity exercise (any activity that increases your heart rate and causes you to sweat) each week. In addition, most adults need muscle-strengthening exercises on 2 or more days a week.   Maintain a healthy weight. The body mass index (BMI) is a screening tool to identify possible weight problems. It provides an estimate of body fat based on height and weight. Your health care provider can find your BMI and can help you achieve or maintain a healthy weight. For males 20 years and older:  A BMI below 18.5 is considered underweight.  A BMI of 18.5 to 24.9 is normal.  A BMI of 25 to 29.9 is considered overweight.  A BMI of 30 and above is considered obese.  Maintain normal blood lipids and cholesterol by exercising and minimizing your intake of saturated fat. Eat a balanced diet with plenty of fruits and vegetables. Blood tests for lipids and cholesterol should begin at age 3 and be repeated every 5 years. If your lipid or cholesterol levels are high, you are over age 29, or you are at high risk for heart disease, you may need your cholesterol levels checked more frequently.Ongoing high lipid and cholesterol levels should be treated with medicines if diet and exercise are not working.  If you smoke, find out from  your health care provider how to quit. If you do not use tobacco, do not start.  Lung cancer screening is recommended for adults aged 83-80 years who are at high risk for developing lung cancer because of a history of smoking. A yearly low-dose CT scan of the lungs is recommended for people who have at least a 30-pack-year history of smoking and are current smokers or have quit within the past 15 years. A pack year of smoking is smoking an average of 1 pack of cigarettes a day for 1 year (for example, a 30-pack-year history of smoking could mean smoking 1 pack a day for 30 years or 2 packs a day for 15 years). Yearly screening should continue until the smoker has stopped smoking for at least 15 years. Yearly screening should be stopped for people who develop a health problem that would prevent them from having lung cancer treatment.  If you choose to drink alcohol, do not have more than 2 drinks per day. One drink is considered to be 12 oz (360 mL) of beer, 5 oz (150 mL) of wine, or 1.5 oz (45 mL) of liquor.  Avoid the use of street drugs. Do not share needles with anyone. Ask for help if you need support or instructions about stopping the use of drugs.  High blood pressure causes heart disease and increases the risk of stroke. High blood pressure is more likely to develop in:  People who have blood pressure in the end of the normal range (100-139/85-89 mm Hg).  People who are overweight  or obese.  People who are African American.  If you are 69-68 years of age, have your blood pressure checked every 3-5 years. If you are 72 years of age or older, have your blood pressure checked every year. You should have your blood pressure measured twice--once when you are at a hospital or clinic, and once when you are not at a hospital or clinic. Record the average of the two measurements. To check your blood pressure when you are not at a hospital or clinic, you can use:  An automated blood pressure machine  at a pharmacy.  A home blood pressure monitor.  If you are 74-71 years old, ask your health care provider if you should take aspirin to prevent heart disease.  Diabetes screening involves taking a blood sample to check your fasting blood sugar level. This should be done once every 3 years after age 44 if you are at a normal weight and without risk factors for diabetes. Testing should be considered at a younger age or be carried out more frequently if you are overweight and have at least 1 risk factor for diabetes.  Colorectal cancer can be detected and often prevented. Most routine colorectal cancer screening begins at the age of 45 and continues through age 59. However, your health care provider may recommend screening at an earlier age if you have risk factors for colon cancer. On a yearly basis, your health care provider may provide home test kits to check for hidden blood in the stool. A small camera at the end of a tube may be used to directly examine the colon (sigmoidoscopy or colonoscopy) to detect the earliest forms of colorectal cancer. Talk to your health care provider about this at age 81 when routine screening begins. A direct exam of the colon should be repeated every 5-10 years through age 77, unless early forms of precancerous polyps or small growths are found.  People who are at an increased risk for hepatitis B should be screened for this virus. You are considered at high risk for hepatitis B if:  You were born in a country where hepatitis B occurs often. Talk with your health care provider about which countries are considered high risk.  Your parents were born in a high-risk country and you have not received a shot to protect against hepatitis B (hepatitis B vaccine).  You have HIV or AIDS.  You use needles to inject street drugs.  You live with, or have sex with, someone who has hepatitis B.  You are a man who has sex with other men (MSM).  You get hemodialysis  treatment.  You take certain medicines for conditions like cancer, organ transplantation, and autoimmune conditions.  Hepatitis C blood testing is recommended for all people born from [birth year] through 1966 and any individual with known risk factors for hepatitis C.  Healthy men should no longer receive prostate-specific antigen (PSA) blood tests as part of routine cancer screening. Talk to your health care provider about prostate cancer screening.  Testicular cancer screening is not recommended for adolescents or adult males who have no symptoms. Screening includes self-exam, a health care provider exam, and other screening tests. Consult with your health care provider about any symptoms you have or any concerns you have about testicular cancer.  Practice safe sex. Use condoms and avoid high-risk sexual practices to reduce the spread of sexually transmitted infections (STIs).  You should be screened for STIs, including gonorrhea and chlamydia if:  You are sexually  active and are younger than 24 years.  You are older than 24 years, and your health care provider tells you that you are at risk for this type of infection.  Your sexual activity has changed since you were last screened, and you are at an increased risk for chlamydia or gonorrhea. Ask your health care provider if you are at risk.  If you are at risk of being infected with HIV, it is recommended that you take a prescription medicine daily to prevent HIV infection. This is called pre-exposure prophylaxis (PrEP). You are considered at risk if:  You are a man who has sex with other men (MSM).  You are a heterosexual man who is sexually active with multiple partners.  You take drugs by injection.  You are sexually active with a partner who has HIV.  Talk with your health care provider about whether you are at high risk of being infected with HIV. If you choose to begin PrEP, you should first be tested for HIV. You should then be tested  every 3 months for as long as you are taking PrEP.  Use sunscreen. Apply sunscreen liberally and repeatedly throughout the day. You should seek shade when your shadow is shorter than you. Protect yourself by wearing long sleeves, pants, a wide-brimmed hat, and sunglasses year round whenever you are outdoors.  Tell your health care provider of new moles or changes in moles, especially if there is a change in shape or color. Also, tell your health care provider if a mole is larger than the size of a pencil eraser.  A one-time screening for abdominal aortic aneurysm (AAA) and surgical repair of large AAAs by ultrasound is recommended for men aged 2-75 years who are current or former smokers.  Stay current with your vaccines (immunizations).   This information is not intended to replace advice given to you by your health care provider. Make sure you discuss any questions you have with your health care provider.   Document Released: 07/22/2007 Document Revised: 02/13/2014 Document Reviewed: 06/20/2010 Elsevier Interactive Patient Education [employer].

## 2015-03-30 ENCOUNTER — Encounter: Payer: Self-pay | Admitting: Family

## 2015-04-02 ENCOUNTER — Ambulatory Visit (INDEPENDENT_AMBULATORY_CARE_PROVIDER_SITE_OTHER): Payer: BC Managed Care – PPO | Admitting: Cardiovascular Disease

## 2015-04-02 ENCOUNTER — Ambulatory Visit (INDEPENDENT_AMBULATORY_CARE_PROVIDER_SITE_OTHER): Payer: Self-pay | Admitting: Pharmacist

## 2015-04-02 ENCOUNTER — Encounter: Payer: Self-pay | Admitting: Cardiovascular Disease

## 2015-04-02 VITALS — BP 110/70 | HR 98 | Ht 74.0 in | Wt 206.0 lb

## 2015-04-02 DIAGNOSIS — I2510 Atherosclerotic heart disease of native coronary artery without angina pectoris: Secondary | ICD-10-CM

## 2015-04-02 DIAGNOSIS — E782 Mixed hyperlipidemia: Secondary | ICD-10-CM

## 2015-04-02 MED ORDER — NITROGLYCERIN 0.4 MG SL SUBL: 0.4000 mg | SUBLINGUAL_TABLET | SUBLINGUAL | Status: DC | PRN

## 2015-04-02 MED ORDER — ROSUVASTATIN CALCIUM 20 MG PO TABS: 20.0000 mg | ORAL_TABLET | Freq: Every day | ORAL | Status: DC

## 2015-04-02 NOTE — Patient Instructions (Signed)
Start Crestor 20mg  daily.  If you start to have any problems with muscle pains, decrease to 1/2 tablet (10mg ) daily.    Please call Gay Filler at [phone] if you have any pains in your muscles or other issues with the medication.   Plan to recheck your labs in 3 months.  We will call you with the results.

## 2015-04-02 NOTE — Progress Notes (Signed)
HPI: Eric Dickerson is a 65 y.o. male patient referred to lipid clinic by Dr. Johnsie Cancel.  He has a PMH significant for CAD, s/p Cypher DES to the ramus and Promus DES x 2 to the RCA 08/2007 (minimal disease in LAD and CFX at that time), PAD, HL, HTN. He has a history of statin intolerance and was referred to Lipid Clinic for evaluation for PCSK-9 inhibitors.   Current Medications: none Intolerances: Crestor 40mg , Pravastatin 20mg  and 40mg , Livalo dose unknown- all caused myalgias that resolved when therapy stopped.  Occurred within a week of starting therapy.  Zetia 10mg  daily- stopped due to no effect on LDL Risk Factors: CAD, HTN, age  LDL goal: < 70 mg/dL, non-HDL goal < 100 mg/dL  Labs: 01/29/15-TC 196, TG 68, HDL 50, LDL 132 (on Zetia 10mg  daily) 10/30/13-  TC 230, TG 52, HDL 53, LDL 166 (on no therapy)  Past Medical History  Diagnosis Date  . CAD (coronary artery disease)     a. CAD,  b. s/p Cypher DES to the ramus and Promus DES x 2 to the RCA 08/2007 (minimal disease in LAD and CFX at that time), c. Myoview 8/10: EF 63%, inf thinning, small prior ant infarct, no ischemia;  d. s/p CABG 3/14 (L-LAD, RIMA-OM1)  . Other abnormal glucose   . Hyperlipidemia     statin intolerant  . HTN (hypertension)   . ERECTILE DYSFUNCTION   . GERD   . HIATAL HERNIA   . DIVERTICULOSIS, COLON   . HYPERPLASIA PROSTATE UNS W/O UR OBST & OTH LUTS   . DEGENERATIVE JOINT DISEASE, RIGHT HIP   . SPINAL STENOSIS, LUMBAR   . LUMBAR RADICULOPATHY, RIGHT   . ANEMIA, MILD   . PAD (peripheral artery disease) ([place])     pre CABG 04/2012 => ABIs: Right 0.92, left 0.61 // ABI 09/02/14: R 0.93; L 0.82    Current Outpatient Prescriptions on File Prior to Visit  Medication Sig Dispense Refill  . amLODipine (NORVASC) 10 MG tablet Take 1 tablet (10 mg total) by mouth daily. 30 tablet 6  . fluticasone (FLONASE) 50 MCG/ACT nasal spray Place 2 sprays into both nostrils daily. 16 g 6  . metoprolol succinate (TOPROL-XL)  25 MG 24 hr tablet Take 1 tablet (25 mg total) by mouth daily. 30 tablet 11  . Multiple Vitamin (MULTIVITAMIN WITH MINERALS) TABS Take 1 tablet by mouth daily. Centrum silver    . nitroGLYCERIN (NITROSTAT) 0.4 MG SL tablet Place 1 tablet (0.4 mg total) under the tongue every 5 (five) minutes as needed for chest pain (3 doses max). 25 tablet 3  . ranitidine (ZANTAC) 150 MG capsule Take 150 mg by mouth 2 (two) times daily as needed for heartburn.    . rosuvastatin (CRESTOR) 20 MG tablet Take 1 tablet (20 mg total) by mouth daily. 30 tablet 11  . tamsulosin (FLOMAX) 0.4 MG CAPS capsule Take 0.4 mg by mouth daily.     No current facility-administered medications on file prior to visit.    Allergies  Allergen Reactions  . Olmesartan Medoxomil Rash  . Quinapril Hcl Other (See Comments)     cough; ACE inhibitors are contraindicated because of a history of rash with Angiotensin receptor blocker.  . Glucosamine Other (See Comments)    Doesn't remember reaction  . Livalo [Pitavastatin]     cramps  . Rosuvastatin Other (See Comments)    : muscle cramps  . Verapamil Other (See Comments)  Extra sensitive to sun    Assessment/Plan: 1.  Hyperlipidemia- Pt not at LDL goal of < 70 mg/dL.  Has only tried max dose Crestor and does not remember trying lower doses.  Will try lower dose to see if he has any side effects.  Suggested starting at 5mg  three days of the week but pt wanted to start at 20mg  and work his way down if need be.  Will recheck labs in 3 months.  If he is unable to tolerate lower doses of Crestor, will pursue PCSK-9 inhibitor.

## 2015-04-02 NOTE — Patient Instructions (Signed)

## 2015-06-28 ENCOUNTER — Other Ambulatory Visit (INDEPENDENT_AMBULATORY_CARE_PROVIDER_SITE_OTHER): Payer: BC Managed Care – PPO

## 2015-06-28 DIAGNOSIS — I10 Essential (primary) hypertension: Secondary | ICD-10-CM

## 2015-06-28 DIAGNOSIS — E782 Mixed hyperlipidemia: Secondary | ICD-10-CM | POA: Diagnosis not present

## 2015-06-28 LAB — LIPID PANEL
Cholesterol: 142 mg/dL (ref 125–200)
HDL: 56 mg/dL (ref >=40)
LDL CALC: 78 mg/dL (ref <130)
TRIGLYCERIDES: 39 mg/dL (ref <150)
Total CHOL/HDL Ratio: 2.5 Ratio (ref <=5.0)
VLDL: 8 mg/dL (ref <30)

## 2015-06-28 LAB — HEPATIC FUNCTION PANEL
ALT: 17 U/L (ref 9–46)
AST: 20 U/L (ref 10–35)
Albumin: 3.9 g/dL (ref 3.6–5.1)
Alkaline Phosphatase: 75 U/L (ref 40–115)
BILIRUBIN INDIRECT: 0.2 mg/dL (ref 0.2–1.2)
Bilirubin, Direct: 0.1 mg/dL (ref <=0.2)
TOTAL PROTEIN: 6.6 g/dL (ref 6.1–8.1)
Total Bilirubin: 0.3 mg/dL (ref 0.2–1.2)

## 2015-06-28 NOTE — Addendum Note (Signed)
Addended by: Stephannie Peters on: 06/28/2015 08:29 AM   Modules accepted: Orders

## 2015-07-01 ENCOUNTER — Telehealth: Payer: Self-pay

## 2015-07-01 DIAGNOSIS — Z79899 Other long term (current) drug therapy: Secondary | ICD-10-CM

## 2015-07-01 NOTE — Telephone Encounter (Signed)
-----   Message from Josue Hector, MD sent at 06/29/2015  5:09 PM EDT ----- Cholesterol is at goal and LFT's are normal F/U labs in 6 months

## 2015-07-01 NOTE — Telephone Encounter (Signed)
Ordered repeat labs and scheduled for 12/22/15. Patient verbalized understanding and knows to be fasting for lab work.

## 2015-08-17 ENCOUNTER — Other Ambulatory Visit: Payer: Self-pay | Admitting: Physician Assistant

## 2015-08-20 ENCOUNTER — Other Ambulatory Visit: Payer: Self-pay | Admitting: Physician Assistant

## 2015-11-10 ENCOUNTER — Other Ambulatory Visit: Payer: Self-pay | Admitting: Cardiovascular Disease

## 2015-11-12 ENCOUNTER — Other Ambulatory Visit: Payer: Self-pay | Admitting: Cardiovascular Disease

## 2015-11-12 NOTE — Telephone Encounter (Signed)
amLODipine (NORVASC) 10 MG tablet  Medication  Date: 11/10/2015 Department: [facility] [address] Office Ordering/Authorizing: Josue Hector, MD  Order Providers   Prescribing Provider Encounter Provider  Josue Hector, MD Josue Hector, MD  Medication Detail    Disp Refills Start End   amLODipine (NORVASC) 10 MG tablet 30 tablet 3 11/10/2015    Sig: TAKE ONE TABLET BY MOUTH EVERY DAY   E-Prescribing Status: Receipt confirmed by pharmacy (11/10/2015 11:23 AM EDT)   Pharmacy   [facility] [place], [place]

## 2015-11-24 ENCOUNTER — Ambulatory Visit (INDEPENDENT_AMBULATORY_CARE_PROVIDER_SITE_OTHER): Payer: BC Managed Care – PPO | Admitting: Family

## 2015-11-24 ENCOUNTER — Encounter: Payer: Self-pay | Admitting: Family

## 2015-11-24 VITALS — BP 140/90 | HR 85 | Temp 98.4°F | Resp 16 | Ht 74.0 in | Wt 202.4 lb

## 2015-11-24 DIAGNOSIS — R350 Frequency of micturition: Secondary | ICD-10-CM | POA: Diagnosis not present

## 2015-11-24 DIAGNOSIS — H6123 Impacted cerumen, bilateral: Secondary | ICD-10-CM | POA: Insufficient documentation

## 2015-11-24 LAB — POCT URINALYSIS DIPSTICK
BILIRUBIN UA: NEGATIVE
GLUCOSE UA: NEGATIVE
Ketones, UA: NEGATIVE
Leukocytes, UA: NEGATIVE
NITRITE UA: NEGATIVE
Protein, UA: NEGATIVE
RBC UA: NEGATIVE
Spec Grav, UA: >=1.03
UROBILINOGEN UA: NEGATIVE
pH, UA: 6

## 2015-11-24 MED ORDER — DARIFENACIN HYDROBROMIDE ER 7.5 MG PO TB24: 7.5000 mg | ORAL_TABLET | Freq: Every day | ORAL | 0 refills | Status: DC

## 2015-11-24 NOTE — Patient Instructions (Signed)
Thank you for choosing [facility].  SUMMARY AND INSTRUCTIONS:  Please continue to take your medications as prescribed.   Follow up in 1 month.   Medication:  Your prescription(s) have been submitted to your pharmacy or been printed and provided for you. Please take as directed and contact our office if you believe you are having problem(s) with the medication(s) or have any questions.   Follow up:  If your symptoms worsen or fail to improve, please contact our office for further instruction, or in case of emergency go directly to the emergency room at the closest medical facility.     Overactive Bladder, Adult Overactive bladder is a group of urinary symptoms. With overactive bladder, you may suddenly feel the need to pass urine (urinate) right away. After feeling this sudden urge, you might also leak urine if you cannot get to the bathroom fast enough (urinary incontinence). These symptoms might interfere with your daily work or social activities. Overactive bladder symptoms may also wake you up at night. Overactive bladder affects the nerve signals between your bladder and your brain. Your bladder may get the signal to empty before it is full. Very sensitive muscles can also make your bladder squeeze too soon. CAUSES Many things can cause an overactive bladder. Possible causes include:  Urinary tract infection.  Infection of nearby tissues, such as the prostate.  Prostate enlargement.  Being pregnant with twins or more (multiples).  Surgery on the uterus or urethra.  Bladder stones, inflammation, or tumors.  Drinking too much caffeine or alcohol.  Certain medicines, especially those that you take to help your body get rid of extra fluid (diuretics) by increasing urine production.  Muscle or nerve weakness, especially from:  A spinal cord injury.  Stroke.  Multiple sclerosis.  Parkinson disease.  Diabetes. This can cause a high urine volume that fills the  bladder so quickly that the normal urge to urinate is triggered very strongly.  Constipation. A buildup of too much stool can put pressure on your bladder. RISK FACTORS You may be at greater risk for overactive bladder if you:  Are an older adult.  Smoke.  Are going through menopause.  Have prostate problems.  Have a neurological disease, such as stroke, dementia, Parkinson disease, or multiple sclerosis (MS).  Eat or drink things that irritate the bladder. These include alcohol, spicy food, and caffeine.  Are overweight or obese. SIGNS AND SYMPTOMS  The signs and symptoms of an overactive bladder include:  Sudden, strong urges to urinate.  Leaking urine.  Urinating eight or more times per day.  Waking up to urinate two or more times per night. DIAGNOSIS Your health care provider may suspect overactive bladder based on your symptoms. The health care provider will do a physical exam and take your medical history. Blood or urine tests may also be done. For example, you might need to have a bladder function test to check how well you can hold your urine. You might also need to see a health care provider who specializes in the urinary tract (urologist). TREATMENT Treatment for overactive bladder depends on the cause of your condition and whether it is mild or severe. Certain treatments can be done in your health care provider's office or clinic. You can also make lifestyle changes at home. Options include: Behavioral Treatments  Biofeedback. A specialist uses sensors to help you become aware of your body's signals.  Keeping a daily log of when you need to urinate and what happens after the  urge. This may help you manage your condition.  Bladder training. This helps you learn to control the urge to urinate by following a schedule that directs you to urinate at regular intervals (timed voiding). At first, you might have to wait a few minutes after feeling the urge. In time, you  should be able to schedule bathroom visits an hour or more apart.  Kegel exercises. These are exercises to strengthen the pelvic floor muscles, which support the bladder. Toning these muscles can help you control urination, even if your bladder muscles are overactive. A specialist will teach you how to do these exercises correctly. They require daily practice.  Weight loss. If you are obese or overweight, losing weight might relieve your symptoms of overactive bladder. Talk to your health care provider about losing weight and whether there is a specific program or method that would work best for you.  Diet change. This might help if constipation is making your overactive bladder worse. Your health care provider or a dietitian can explain ways to change what you eat to ease constipation. You might also need to consume less alcohol and caffeine or drink other fluids at different times of the day.  Stopping smoking.  Wearing pads to absorb leakage while you wait for other treatments to take effect. Physical Treatments  Electrical stimulation. Electrodes send gentle pulses of electricity to strengthen the nerves or muscles that help to control the bladder. Sometimes, the electrodes are placed outside of the body. In other cases, they might be placed inside the body (implanted). This treatment can take several months to have an effect.  Supportive devices. Women may need a plastic device that fits into the vagina and supports the bladder (pessary). Medicines Several medicines can help treat overactive bladder and are usually used along with other treatments. Some are injected into the muscles involved in urination. Others come in pill form. Your health care provider may prescribe:  Antispasmodics. These medicines block the signals that the nerves send to the bladder. This keeps the bladder from releasing urine at the wrong time.  Tricyclic antidepressants. These types of antidepressants also relax  bladder muscles. Surgery  You may have a device implanted to help manage the nerve signals that indicate when you need to urinate.  You may have surgery to implant electrodes for electrical stimulation.  Sometimes, very severe cases of overactive bladder require surgery to change the shape of the bladder. HOME CARE INSTRUCTIONS   Take medicines only as directed by your health care provider.  Use any implants or a pessary as directed by your health care provider.  Make any diet or lifestyle changes that are recommended by your health care provider. These might include:  Drinking less fluid or drinking at different times of the day. If you need to urinate often during the night, you may need to stop drinking fluids early in the evening.  Cutting down on caffeine or alcohol. Both can make an overactive bladder worse. Caffeine is found in coffee, tea, and sodas.  Doing Kegel exercises to strengthen muscles.  Losing weight if you need to.  Eating a healthy and balanced diet to prevent constipation.  Keep a journal or log to track how much and when you drink and also when you feel the need to urinate. This will help your health care provider to monitor your condition. SEEK MEDICAL CARE IF:  Your symptoms do not get better after treatment.  Your pain and discomfort are getting worse.  You have  more frequent urges to urinate.  You have a fever. SEEK IMMEDIATE MEDICAL CARE IF: You are not able to control your bladder at all.   This information is not intended to replace advice given to you by your health care provider. Make sure you discuss any questions you have with your health care provider.   Document Released: 11/19/2008 Document Revised: 02/13/2014 Document Reviewed: 06/18/2013 Elsevier Interactive Patient Education [employer].

## 2015-11-24 NOTE — Assessment & Plan Note (Signed)
Urinary frequency with concern for overactive bladder versus benign prostate hypertrophy. Given symptom improvements in night no changes in stream will treat for overactive bladder. Start Enablex. Encouraged to limit caffeine intake. Follow-up if symptoms worsen or do not improve with medication change.

## 2015-11-24 NOTE — Assessment & Plan Note (Signed)
Impacted cerumen noted bilaterally. Both ears were cleaned out with warm water mixed with Docusate Sodium. There was wax return from both ears and ear drum was visible. Patient tolerated this well. Discussed continued ear care to prevent build up. Continue to monitor.

## 2015-11-24 NOTE — Progress Notes (Signed)
Subjective:    Patient ID: Eric Dickerson, male    DOB: [birth year], 65 y.o.   MRN: [id number]  Chief Complaint  Patient presents with  . ear pressure    ear pressure that has been going on x2 weeks, not other sinus sxs asscociated, wants to discuss urinary frequency    HPI:  Eric Dickerson is a 65 y.o. male who  has a past medical history of ANEMIA, MILD; CAD (coronary artery disease); DEGENERATIVE JOINT DISEASE, RIGHT HIP; DIVERTICULOSIS, COLON; ERECTILE DYSFUNCTION; GERD; HIATAL HERNIA; HTN (hypertension); Hyperlipidemia; HYPERPLASIA PROSTATE UNS W/O UR OBST & OTH LUTS; LUMBAR RADICULOPATHY, RIGHT; Other abnormal glucose; PAD (peripheral artery disease) ([place]); and SPINAL STENOSIS, LUMBAR. and presents today for an acute office visit.   1.) Ear pressure - This is a new problem. Associated symptom of pressure located in his ears has been going on for about 2 weeks. No other symptoms of pain or discharge. No fevers. Modifying factors include Debrox which did not help very much.   2.) Urinary frequency -  Currently maintained on tamsulosin. Reports taking the medication as prescribed and denies adverse side effects. Frequency has decreased at night with the tamsulosin. Denies dysuria, urgency or hematuria.   Allergies  Allergen Reactions  . Olmesartan Medoxomil Rash  . Quinapril Hcl Other (See Comments)     cough; ACE inhibitors are contraindicated because of a history of rash with Angiotensin receptor blocker.  . Glucosamine Other (See Comments)    Doesn't remember reaction  . Livalo [Pitavastatin]     cramps  . Rosuvastatin Other (See Comments)    : muscle cramps  . Verapamil Other (See Comments)    Extra sensitive to sun      Outpatient Medications Prior to Visit  Medication Sig Dispense Refill  . amLODipine (NORVASC) 10 MG tablet TAKE ONE TABLET BY MOUTH EVERY DAY 30 tablet 3  . fluticasone (FLONASE) 50 MCG/ACT nasal spray Place 2 sprays into both nostrils daily. 16 g 6  .  metoprolol succinate (TOPROL-XL) 25 MG 24 hr tablet TAKE ONE TABLET BY MOUTH EVERY DAY 30 tablet 11  . Multiple Vitamin (MULTIVITAMIN WITH MINERALS) TABS Take 1 tablet by mouth daily. Centrum silver    . nitroGLYCERIN (NITROSTAT) 0.4 MG SL tablet Place 1 tablet (0.4 mg total) under the tongue every 5 (five) minutes as needed for chest pain (3 doses max). 25 tablet 3  . ranitidine (ZANTAC) 150 MG capsule Take 150 mg by mouth 2 (two) times daily as needed for heartburn.    . rosuvastatin (CRESTOR) 20 MG tablet Take 1 tablet (20 mg total) by mouth daily. 30 tablet 11  . tamsulosin (FLOMAX) 0.4 MG CAPS capsule Take 0.4 mg by mouth daily.     No facility-administered medications prior to visit.       Past Surgical History:  Procedure Laterality Date  . COLONOSCOPY  2002   negative  . CORONARY ARTERY BYPASS GRAFT N/A 04/22/2012   Procedure: CORONARY ARTERY BYPASS GRAFTING (CABG);  Surgeon: [name], MD;  Location: [facility] [place];  Service: Open Heart Surgery;  Laterality: N/A;  CABG X 2, BIMA, POSSIBLE EVH  . CORONARY STENT PLACEMENT  2010    X 2  . TEE WITHOUT CARDIOVERSION N/A 04/22/2012   Procedure: TRANSESOPHAGEAL ECHOCARDIOGRAM (TEE);  Surgeon: [name], MD;  Location: [place] [facility];  Service: Open Heart Surgery;  Laterality: N/A;  . TOTAL HIP ARTHROPLASTY  2009      Past Medical History:  Diagnosis Date  . ANEMIA, MILD   . CAD (coronary artery disease)    a. CAD,  b. s/p Cypher DES to the ramus and Promus DES x 2 to the RCA 08/2007 (minimal disease in LAD and CFX at that time), c. Myoview 8/10: EF 63%, inf thinning, small prior ant infarct, no ischemia;  d. s/p CABG 3/14 (L-LAD, RIMA-OM1)  . DEGENERATIVE JOINT DISEASE, RIGHT HIP   . DIVERTICULOSIS, COLON   . ERECTILE DYSFUNCTION   . GERD   . HIATAL HERNIA   . HTN (hypertension)   . Hyperlipidemia    statin intolerant  . HYPERPLASIA PROSTATE UNS W/O UR OBST & OTH LUTS   . LUMBAR RADICULOPATHY, RIGHT   . Other abnormal  glucose   . PAD (peripheral artery disease) ([place])    pre CABG 04/2012 => ABIs: Right 0.92, left 0.61 // ABI 09/02/14: R 0.93; L 0.82  . SPINAL STENOSIS, LUMBAR     Review of Systems  Constitutional: Negative for chills and fever.  HENT: Negative for ear discharge and ear pain.       Objective:    BP 140/90 (BP Location: Left Arm, Patient Position: Sitting, Cuff Size: Normal)   Pulse 85   Temp 98.4 F (36.9 C) (Oral)   Resp 16   Ht 6\' 2"  (1.88 m)   Wt 202 lb 6.4 oz (91.8 kg)   SpO2 97%   BMI 25.99 kg/m  Nursing note and vital signs reviewed.  Physical Exam  Constitutional: He is oriented to person, place, and time. He appears well-developed and well-nourished. No distress.  HENT:  Impacted cerumen noted bilaterally.   Cardiovascular: Normal rate, regular rhythm, normal heart sounds and intact distal pulses.   Pulmonary/Chest: Effort normal and breath sounds normal.  Abdominal: There is no hepatosplenomegaly. There is no CVA tenderness.  Neurological: He is alert and oriented to person, place, and time.  Skin: Skin is warm and dry.  Psychiatric: He has a normal mood and affect. His behavior is normal. Judgment and thought content normal.       Assessment & Plan:   Problem List Items Addressed This Visit      Nervous and Auditory   Impacted cerumen of both ears    Impacted cerumen noted bilaterally. Both ears were cleaned out with warm water mixed with Docusate Sodium. There was wax return from both ears and ear drum was visible. Patient tolerated this well. Discussed continued ear care to prevent build up. Continue to monitor.          Other   Urinary frequency - Primary    Urinary frequency with concern for overactive bladder versus benign prostate hypertrophy. Given symptom improvements in night no changes in stream will treat for overactive bladder. Start Enablex. Encouraged to limit caffeine intake. Follow-up if symptoms worsen or do not improve with medication  change.      Relevant Medications   darifenacin (ENABLEX) 7.5 MG 24 hr tablet   Other Relevant Orders   POCT urinalysis dipstick (Completed)    Other Visit Diagnoses   None.      I am having Mr. Halili start on darifenacin. I am also having him maintain his multivitamin with minerals, ranitidine, fluticasone, tamsulosin, nitroGLYCERIN, rosuvastatin, metoprolol succinate, and amLODipine.   Meds ordered this encounter  Medications  . darifenacin (ENABLEX) 7.5 MG 24 hr tablet    Sig: Take 1 tablet (7.5 mg total) by mouth daily.    Dispense:  30 tablet  Refill:  0    Order Specific Question:   Supervising Provider    Answer:   Pricilla Holm A [id number]     Follow-up: Return in about 1 month (around 12/25/2015), or if symptoms worsen or fail to improve.  Mauricio Po, FNP

## 2015-12-22 ENCOUNTER — Other Ambulatory Visit: Payer: BC Managed Care – PPO | Admitting: *Deleted

## 2015-12-22 DIAGNOSIS — Z79899 Other long term (current) drug therapy: Secondary | ICD-10-CM

## 2015-12-22 LAB — LIPID PANEL
CHOL/HDL RATIO: 2.8 ratio (ref <5.0)
CHOLESTEROL: 167 mg/dL (ref <200)
HDL: 60 mg/dL (ref >40)
LDL Cholesterol: 95 mg/dL (ref <100)
TRIGLYCERIDES: 58 mg/dL (ref <150)
VLDL: 12 mg/dL (ref <30)

## 2015-12-22 LAB — HEPATIC FUNCTION PANEL
ALT: 17 U/L (ref 9–46)
AST: 22 U/L (ref 10–35)
Albumin: 3.9 g/dL (ref 3.6–5.1)
Alkaline Phosphatase: 62 U/L (ref 40–115)
BILIRUBIN INDIRECT: 0.2 mg/dL (ref 0.2–1.2)
Bilirubin, Direct: 0.1 mg/dL (ref <=0.2)
TOTAL PROTEIN: 7.1 g/dL (ref 6.1–8.1)
Total Bilirubin: 0.3 mg/dL (ref 0.2–1.2)

## 2015-12-24 ENCOUNTER — Telehealth: Payer: Self-pay | Admitting: Cardiovascular Disease

## 2015-12-24 DIAGNOSIS — I2510 Atherosclerotic heart disease of native coronary artery without angina pectoris: Secondary | ICD-10-CM

## 2015-12-24 DIAGNOSIS — Z79899 Other long term (current) drug therapy: Secondary | ICD-10-CM

## 2015-12-24 DIAGNOSIS — E782 Mixed hyperlipidemia: Secondary | ICD-10-CM

## 2015-12-24 NOTE — Telephone Encounter (Signed)
Left message for patient to call back for lab results. Sent message of results through [facility]. Per Dr. Johnsie Cancel, patient will need lab work again in 6 months.

## 2015-12-24 NOTE — Telephone Encounter (Signed)
New message  ° ° °Pt verbalized that he is returning call for rn °

## 2016-01-04 NOTE — Telephone Encounter (Signed)
Will schedule labs at patient's January appointment for April/May time frame.

## 2016-02-24 ENCOUNTER — Other Ambulatory Visit: Payer: Self-pay | Admitting: Cardiovascular Disease

## 2016-02-24 NOTE — Progress Notes (Signed)
Patient ID: Eric Dickerson, male   DOB: 1950/03/19, 66 y.o.   MRN: [id number]    Cardiology Office Note   Date:  02/25/2016   ID:  Eric Dickerson, DOB June 03, 1950, MRN [id number]  PCP:  Mauricio Po, FNP  Cardiologist:  Dr. Jenkins Rouge   PV:  Dr. Kathlyn [place]   No chief complaint on file.    History of Present Illness: Eric Dickerson is a 66 y.o. male with a hx of CAD, s/p Cypher DES to the ramus and Promus DES x 2 to the RCA 08/2007 (minimal disease in LAD and CFX at that time), PAD, HL, HTN. He developed progressively worsening chest pain in 3/14 and LHC demonstrated 3 v CAD.  He was referred for CABG by Dr. Roxan Hockey 04/22/12 (LIMA-LAD, free RIMA-OM1).    Last seen by me  8/14.   Returns for FU.  Here alone.  Still working at [employer].  No chest pain, shortness of breath, syncope, orthopnea, PND, edema.    Does have some claudication in LLE   BP up today has not taken meds in 2 days Admits to a lot of med non compliance. Crestor causing cramps Even at 3x/week Not taking Plavix  either No chest pain  Studies/Reports Reviewed Today:  Myoview 8/10: EF 63%, inf thinning, small prior ant infarct, no ischemia.   LHC 04/15/12: Distal left main 20%, ostial LAD 80%, mid LAD 40% multiple discrete lesions, ramus intermediate patent stent, ostial circumflex 95%, proximal and mid RCA stents patent, PDA 40%, EF 55%.  Preop carotid Dopplers were negative for significant ICA stenosis bilaterally.  09/01/14   ABIs: Right 0.92, left 0.82.  Discussed with [facility] alternative for cholesterol meds and he will try crestor 20 mg    Past Medical History:  Diagnosis Date  . ANEMIA, MILD   . CAD (coronary artery disease)    a. CAD,  b. s/p Cypher DES to the ramus and Promus DES x 2 to the RCA 08/2007 (minimal disease in LAD and CFX at that time), c. Myoview 8/10: EF 63%, inf thinning, small prior ant infarct, no ischemia;  d. s/p CABG 3/14 (L-LAD, RIMA-OM1)  . DEGENERATIVE JOINT DISEASE, RIGHT HIP    . DIVERTICULOSIS, COLON   . ERECTILE DYSFUNCTION   . GERD   . HIATAL HERNIA   . HTN (hypertension)   . Hyperlipidemia    statin intolerant  . HYPERPLASIA PROSTATE UNS W/O UR OBST & OTH LUTS   . LUMBAR RADICULOPATHY, RIGHT   . Other abnormal glucose   . PAD (peripheral artery disease) ([place])    pre CABG 04/2012 => ABIs: Right 0.92, left 0.61 // ABI 09/02/14: R 0.93; L 0.82  . SPINAL STENOSIS, LUMBAR     Past Surgical History:  Procedure Laterality Date  . COLONOSCOPY  2002   negative  . CORONARY ARTERY BYPASS GRAFT N/A 04/22/2012   Procedure: CORONARY ARTERY BYPASS GRAFTING (CABG);  Surgeon: [name], MD;  Location: [facility];  Service: Open Heart Surgery;  Laterality: N/A;  CABG X 2, BIMA, POSSIBLE EVH  . CORONARY STENT PLACEMENT  2010    X 2  . TEE WITHOUT CARDIOVERSION N/A 04/22/2012   Procedure: TRANSESOPHAGEAL ECHOCARDIOGRAM (TEE);  Surgeon: [name], MD;  Location: [facility];  Service: Open Heart Surgery;  Laterality: N/A;  . TOTAL HIP ARTHROPLASTY  2009     Current Outpatient Prescriptions  Medication Sig Dispense Refill  . amLODipine (NORVASC) 10 MG tablet Take 1 tablet (  10 mg total) by mouth daily. 30 tablet 1  . darifenacin (ENABLEX) 7.5 MG 24 hr tablet Take 1 tablet (7.5 mg total) by mouth daily. 30 tablet 0  . fluticasone (FLONASE) 50 MCG/ACT nasal spray Place 2 sprays into both nostrils daily. 16 g 6  . metoprolol succinate (TOPROL-XL) 25 MG 24 hr tablet TAKE ONE TABLET BY MOUTH EVERY DAY 30 tablet 11  . Multiple Vitamin (MULTIVITAMIN WITH MINERALS) TABS Take 1 tablet by mouth daily. Centrum silver    . nitroGLYCERIN (NITROSTAT) 0.4 MG SL tablet Place 1 tablet (0.4 mg total) under the tongue every 5 (five) minutes as needed for chest pain (3 doses max). 25 tablet 3  . ranitidine (ZANTAC) 150 MG capsule Take 150 mg by mouth 2 (two) times daily as needed for heartburn.    . rosuvastatin (CRESTOR) 20 MG tablet Take 1 tablet (20 mg total) by mouth daily. 30  tablet 11  . tamsulosin (FLOMAX) 0.4 MG CAPS capsule Take 0.4 mg by mouth daily.    . clopidogrel (PLAVIX) 75 MG tablet Take 1 tablet (75 mg total) by mouth daily. 90 tablet 3   No current facility-administered medications for this visit.     Allergies:   Olmesartan medoxomil; Quinapril hcl; Glucosamine; Livalo [pitavastatin]; Rosuvastatin; and Verapamil    Social History:  The patient  reports that he quit smoking about 31 years ago. He has a 18.00 pack-year smoking history. He has never used smokeless tobacco. He reports that he does not drink alcohol or use drugs.   Family History:  The patient's family history includes Diabetes in his maternal grandmother; Heart attack (age of onset: 88) in his father; Heart attack (age of onset: 4) in his brother; Hyperlipidemia in his brother and mother; Hypertension in his father; Pulmonary embolism in his brother; Stroke in his brother.    ROS:   Please see the history of present illness.   Review of Systems  All other systems reviewed and are negative.     PHYSICAL EXAM: VS:  BP (!) 152/98 (BP Location: Right Arm)   Pulse 82   Ht 6\' 2"  (1.88 m)   Wt 203 lb 12.8 oz (92.4 kg)   BMI 26.17 kg/m     Wt Readings from Last 3 Encounters:  02/25/16 203 lb 12.8 oz (92.4 kg)  11/24/15 202 lb 6.4 oz (91.8 kg)  04/02/15 206 lb (93.4 kg)     GEN: Well nourished, well developed, in no acute distress  HEENT: normal  Neck: no JVD, no carotid bruits, no masses Cardiac:  Normal S1/S2, RRR; no murmur ,  no rubs or gallops, no edema   Respiratory:  clear to auscultation bilaterally, no wheezing, rhonchi or rales. GI: soft, nontender, nondistended, + BS MS: no deformity or atrophy  Skin: warm and dry  Neuro:  CNs II-XII intact, Strength and sensation are intact Psych: Normal affect   EKG:  09/01/14   NSR, HR 70, normal axis, RBBB, no change from prior tracing 02/25/16 SR rate 82 RBBB ? Old IMI   Recent Labs: 03/29/2015: BUN 17; Creatinine, Ser  1.15; Hemoglobin 12.3; Platelets 324.0; Potassium 4.4; Sodium 141; TSH 1.71 12/22/2015: ALT 17    Lipid Panel    Component Value Date/Time   CHOL 167 12/22/2015 0807   TRIG 58 12/22/2015 0807   HDL 60 12/22/2015 0807   CHOLHDL 2.8 12/22/2015 0807   VLDL 12 12/22/2015 0807   LDLCALC 95 12/22/2015 0807   LDLDIRECT 130.4 11/17/2009 0853  ASSESSMENT AND PLAN:  Coronary artery disease involving native coronary artery of native heart without angina pectoris:  No angina.  He cannot tolerate ASA 2/2 GERD.  New script for plavix called in .    Continue beta-blocker.   Essential hypertension:  Non compliant continue beta blocker , norvasc stressed compliance  Hyperlipidemia:  Intolerant to some statins and zetia Taking  crestor irregularly will refer to lipid clinic To see if he is a PSK 9 candidate  Lab Results  Component Value Date   [place] 95 12/22/2015      PAD (peripheral artery disease):   Left ABI .82 on July 2016  /fu ABI's in a year    Medication Changes: crestor 20 mg    Labs/ tests ordered today include:   Orders Placed This Encounter  Procedures  . EKG 12-Lead     Disposition:   FU with me  1 year.   Jenkins Rouge

## 2016-02-25 ENCOUNTER — Ambulatory Visit (INDEPENDENT_AMBULATORY_CARE_PROVIDER_SITE_OTHER): Payer: BC Managed Care – PPO | Admitting: Cardiovascular Disease

## 2016-02-25 ENCOUNTER — Encounter: Payer: Self-pay | Admitting: Cardiovascular Disease

## 2016-02-25 VITALS — BP 152/98 | HR 82 | Ht 74.0 in | Wt 203.8 lb

## 2016-02-25 DIAGNOSIS — I10 Essential (primary) hypertension: Secondary | ICD-10-CM | POA: Diagnosis not present

## 2016-02-25 MED ORDER — CLOPIDOGREL BISULFATE 75 MG PO TABS: 75.0000 mg | ORAL_TABLET | Freq: Every day | ORAL | 3 refills | Status: DC

## 2016-02-25 MED ORDER — CLOPIDOGREL BISULFATE 75 MG PO TABS: 75.0000 mg | ORAL_TABLET | Freq: Every day | ORAL | 0 refills | Status: DC

## 2016-02-25 NOTE — Patient Instructions (Addendum)
Medication Instructions:  START Plavix 75 mg by mouth daily  Lab work: NONE  Testing/Procedures: NONE  Follow-Up: Your physician wants you to follow-up in: 6 months with Dr. Johnsie Cancel. You will receive a reminder letter in the mail two months in advance. If you don't receive a letter, please call our office to schedule the follow-up appointment.  Your physician recommends that you schedule an appointment with lipid clinic to discuss alternative medications for cholesterol.   If you need a refill on your cardiac medications before your next appointment, please call your pharmacy.

## 2016-03-03 ENCOUNTER — Ambulatory Visit (INDEPENDENT_AMBULATORY_CARE_PROVIDER_SITE_OTHER): Payer: BC Managed Care – PPO | Admitting: Pharmacist

## 2016-03-03 DIAGNOSIS — E782 Mixed hyperlipidemia: Secondary | ICD-10-CM | POA: Diagnosis not present

## 2016-03-03 MED ORDER — ROSUVASTATIN CALCIUM 10 MG PO TABS: ORAL_TABLET | ORAL | 3 refills | Status: DC

## 2016-03-03 MED ORDER — EZETIMIBE 10 MG PO TABS: 10.0000 mg | ORAL_TABLET | Freq: Every day | ORAL | 3 refills | Status: DC

## 2016-03-03 NOTE — Progress Notes (Signed)
Patient ID: Eric TRIPPLETT                 DOB: Jul 05, 1950                    MRN: [id number]     HPI: Eric Dickerson is a 66 y.o. male patient referred to lipid clinic by Dr Johnsie Cancel. PMH is significant for CAD s/p Cypher DES to the ramus and Promus DES x 2 to the RCA 08/2007 (minimal disease in LAD and CFX at that time), PAD, HL, HTN. He developed progressively worsening chest pain in 3/14 and LHC demonstrated 3 v CAD.  He was referred for CABG by Dr. Roxan Hockey 04/22/12 (LIMA-LAD, free RIMA-OM1). Patient has a history of statin intolerance and general medication non adherence and presents for further management.  Patient reports that he experiences muscle tightness and aches when he takes statins. He has tried Livalo, pravastatin, Lipitor, Crestor, and Zetia. He does not recall the specific side effects he experienced with Zetia. Most recently, pt has been taking Crestor 20mg  every 3 days, although he has still been experiencing muscle aches. He has a significant family history of heart disease; his father died from an MI at age 31.   Pt is generally non compliant with his medications. He only takes his Plavix a few days a week because he is afraid it will cause bleeding. Discussed patient's need for taking Plavix and that the benefits outweigh the risks.  Current Medications: Crestor 20mg  3 days per week - experiencing muscle aches Intolerances: Crestor 20mg  daily and 3 days per week, Livalo 2mg  3 days per week, pravastatin 40mg  daily and 20mg  3 days per week, Lipitor, Zetia 10mg  daily Risk Factors: CAD s/p PCI with DES, CABG, PAD LDL goal: 70mg /dL   Family History: The patient's family history includes Diabetes in his maternal grandmother; Heart attack (age of onset: 45) in his father; Heart attack (age of onset: 51) in his brother; Hyperlipidemia in his brother and mother; Hypertension in his father; Pulmonary embolism in his brother; Stroke in his brother.   Social History: The patient  reports  that he quit smoking about 31 years ago. He has a 18.00 pack-year smoking history. He has never used smokeless tobacco. He reports that he does not drink alcohol or use drugs.  Labs: 12/22/15: TC 167, TG 58, HDL 60, LDL 95 (Crestor 20mg  every 3 days)   Past Medical History:  Diagnosis Date  . ANEMIA, MILD   . CAD (coronary artery disease)    a. CAD,  b. s/p Cypher DES to the ramus and Promus DES x 2 to the RCA 08/2007 (minimal disease in LAD and CFX at that time), c. Myoview 8/10: EF 63%, inf thinning, small prior ant infarct, no ischemia;  d. s/p CABG 3/14 (L-LAD, RIMA-OM1)  . DEGENERATIVE JOINT DISEASE, RIGHT HIP   . DIVERTICULOSIS, COLON   . ERECTILE DYSFUNCTION   . GERD   . HIATAL HERNIA   . HTN (hypertension)   . Hyperlipidemia    statin intolerant  . HYPERPLASIA PROSTATE UNS W/O UR OBST & OTH LUTS   . LUMBAR RADICULOPATHY, RIGHT   . Other abnormal glucose   . PAD (peripheral artery disease) ([place])    pre CABG 04/2012 => ABIs: Right 0.92, left 0.61 // ABI 09/02/14: R 0.93; L 0.82  . SPINAL STENOSIS, LUMBAR     Current Outpatient Prescriptions on File Prior to Visit  Medication Sig Dispense Refill  . amLODipine (  NORVASC) 10 MG tablet Take 1 tablet (10 mg total) by mouth daily. 30 tablet 1  . clopidogrel (PLAVIX) 75 MG tablet Take 1 tablet (75 mg total) by mouth daily. 90 tablet 3  . clopidogrel (PLAVIX) 75 MG tablet Take 1 tablet (75 mg total) by mouth daily. 7 tablet 0  . darifenacin (ENABLEX) 7.5 MG 24 hr tablet Take 1 tablet (7.5 mg total) by mouth daily. 30 tablet 0  . fluticasone (FLONASE) 50 MCG/ACT nasal spray Place 2 sprays into both nostrils daily. 16 g 6  . metoprolol succinate (TOPROL-XL) 25 MG 24 hr tablet TAKE ONE TABLET BY MOUTH EVERY DAY 30 tablet 11  . Multiple Vitamin (MULTIVITAMIN WITH MINERALS) TABS Take 1 tablet by mouth daily. Centrum silver    . nitroGLYCERIN (NITROSTAT) 0.4 MG SL tablet Place 1 tablet (0.4 mg total) under the tongue every 5 (five) minutes as  needed for chest pain (3 doses max). 25 tablet 3  . ranitidine (ZANTAC) 150 MG capsule Take 150 mg by mouth 2 (two) times daily as needed for heartburn.    . rosuvastatin (CRESTOR) 20 MG tablet Take 1 tablet (20 mg total) by mouth daily. 30 tablet 11  . tamsulosin (FLOMAX) 0.4 MG CAPS capsule Take 0.4 mg by mouth daily.     No current facility-administered medications on file prior to visit.     Allergies  Allergen Reactions  . Olmesartan Medoxomil Rash  . Quinapril Hcl Other (See Comments)     cough; ACE inhibitors are contraindicated because of a history of rash with Angiotensin receptor blocker.  . Glucosamine Other (See Comments)    Doesn't remember reaction  . Livalo [Pitavastatin]     cramps  . Rosuvastatin Other (See Comments)    : muscle cramps  . Verapamil Other (See Comments)    Extra sensitive to sun    Assessment/Plan:  1. Hyperlipidemia - LDL 95mg /dL above goal 70mg /dL in November while taking Crestor 20mg  every 3 days. Pt has been experiencing myalgias on this dose, but is willing to try a lower dose of Crestor 10mg  every other day. If pt is tolerating, will start Zetia 10mg  daily 1 month after starting Crestor. Repeat lipids in 4 months. Encouraged medication adherence.   Megan E. Supple, PharmD, CPP, [facility] [id number] [place],  [address] Phone: [phone]; Fax: [phone] 03/03/2016 3:03 PM

## 2016-03-03 NOTE — Patient Instructions (Signed)
Start taking Crestor (rosuvastatin) 10mg  every other day.  If you tolerate that, add on Zetia (ezetimibe) 10mg  once a day.  Recheck cholesterol in 4 months - we will call you to set up an appointment.

## 2016-03-27 ENCOUNTER — Other Ambulatory Visit: Payer: Self-pay | Admitting: Cardiovascular Disease

## 2016-05-25 ENCOUNTER — Other Ambulatory Visit: Payer: Self-pay | Admitting: Cardiovascular Disease

## 2016-06-29 ENCOUNTER — Other Ambulatory Visit: Payer: BC Managed Care – PPO | Admitting: *Deleted

## 2016-06-29 DIAGNOSIS — E782 Mixed hyperlipidemia: Secondary | ICD-10-CM

## 2016-06-29 DIAGNOSIS — Z79899 Other long term (current) drug therapy: Secondary | ICD-10-CM

## 2016-06-29 DIAGNOSIS — I2510 Atherosclerotic heart disease of native coronary artery without angina pectoris: Secondary | ICD-10-CM

## 2016-06-29 LAB — HEPATIC FUNCTION PANEL
ALT: 18 IU/L (ref 0–44)
AST: 27 IU/L (ref 0–40)
Albumin: 4 g/dL (ref 3.6–4.8)
Alkaline Phosphatase: 77 IU/L (ref 39–117)
Bilirubin Total: 0.3 mg/dL (ref 0.0–1.2)
Bilirubin, Direct: 0.09 mg/dL (ref 0.00–0.40)
Total Protein: 6.7 g/dL (ref 6.0–8.5)

## 2016-06-29 LAB — LIPID PANEL
CHOL/HDL RATIO: 3.1 ratio (ref 0.0–5.0)
Cholesterol, Total: 175 mg/dL (ref 100–199)
HDL: 56 mg/dL (ref >39)
LDL CALC: 109 mg/dL — AB (ref 0–99)
Triglycerides: 52 mg/dL (ref 0–149)
VLDL CHOLESTEROL CAL: 10 mg/dL (ref 5–40)

## 2016-07-05 ENCOUNTER — Ambulatory Visit (INDEPENDENT_AMBULATORY_CARE_PROVIDER_SITE_OTHER): Payer: BC Managed Care – PPO | Admitting: Pharmacist

## 2016-07-05 VITALS — Wt 199.1 lb

## 2016-07-05 DIAGNOSIS — E784 Other hyperlipidemia: Secondary | ICD-10-CM

## 2016-07-05 DIAGNOSIS — E7849 Other hyperlipidemia: Secondary | ICD-10-CM

## 2016-07-05 MED ORDER — EZETIMIBE 10 MG PO TABS: 10.0000 mg | ORAL_TABLET | Freq: Every day | ORAL | 3 refills | Status: DC

## 2016-07-05 MED ORDER — ROSUVASTATIN CALCIUM 10 MG PO TABS: ORAL_TABLET | ORAL | 3 refills | Status: DC

## 2016-07-05 MED ORDER — EZETIMIBE 10 MG PO TABS: ORAL_TABLET | ORAL | 3 refills | Status: DC

## 2016-07-05 NOTE — Patient Instructions (Addendum)
It was great to see you today  Continue taking ezetimibe (Zetia) 10mg  every other day  Please start taking rosuvastatin (Crestor) 10mg  every other day  Make sure that you do not start your rosuvastatin (Crestor) 20mg  every day  Continue to take the rest of your medications the same way  Please call us if you have any issues at all, [phone].

## 2016-07-05 NOTE — Progress Notes (Signed)
Patient ID: Eric Dickerson                 DOB: 1950/03/24                    MRN: [id number]     HPI: Eric Dickerson is a 66 y.o. male patient referred to lipid clinic by Dr Johnsie Cancel. PMH is significant for CAD s/p DES x 2 to the RCA and ramus 08/2007 (f/u revealed 3v disease now s/p CABG 04/22/12), PAD, HL, HTN. Patient has hx of statin intolerance and general medication non adherence. Pt presentsfor further lipid management.   Most recently pt was on Crestor 20mg  TIW where he experienced myalgias and dose was reduced to Crestor 10mg  every other day. Pt was also instructed to start Zetia 10mg  daily after 1 month if able to tolerate Crestor 10mg  every other day. Per initial follow up phone call, pt never started Zetia (previous complaints of adverse effects but could not recall) but was adherent to Crestor 10mg  every other day. Lipid panel on 06/29/16 revealed LDL still above goal < 70 mg/dL at 109mg /dL.  At follow up today, it was discovered that pt actually started Zetia 10mg  every other day and never started the Crestor 10 mg every other day. He actually started on Zetia 10mg  daily and then cut it back due to myalgias on it when he took it every day. Therefore, pt has only been on Zetia 10mg  every other day since last visit in lipid clinic and during last lipid panel. Majority of this visit was directed at uncovering how he takes his medications and extensive counseling regarding plan moving forward. Pt not opposed to PCSK9i in future but would like to try an oral option first.  Current Medications: Zetia 10mg  every other day Intolerances: Crestor 20mg  daily and 3 days per week, Livalo 2mg  3 days per week, pravastatin 40mg  daily and 20mg  3 days per week, Lipitor, Zetia 10mg  daily Risk Factors: CAD s/p PCI with DES, CABG, PAD, family hx (father died of MI at age 48) LDL goal: 70mg /dL   Family History: The patient's family history includes Diabetes in his maternal grandmother; Heart attack (age of  onset: 54) in his father; Heart attack (age of onset: 76) in his brother; Hyperlipidemia in his brother and mother; Hypertension in his father; Pulmonary embolism in his brother; Stroke in his brother.  Social History: The patient reports that he quit smoking about 31 years ago. He has a 18.00 pack-year smoking history. He has never used smokeless tobacco. He reports that he does not drink alcohol or use drugs.  Labs: 06/29/16: TC 175, TG 52, HDL 56, LDL 109 (Zetia 10mg  every other day) 12/22/15: TC 167, TG 58, HDL 60, LDL 95 (Crestor 20mg  every 3 days)       Past Medical History:  Diagnosis Date  . ANEMIA, MILD   . CAD (coronary artery disease)    a. CAD,  b. s/p Cypher DES to the ramus and Promus DES x 2 to the RCA 08/2007 (minimal disease in LAD and CFX at that time), c. Myoview 8/10: EF 63%, inf thinning, small prior ant infarct, no ischemia;  d. s/p CABG 3/14 (L-LAD, RIMA-OM1)  . DEGENERATIVE JOINT DISEASE, RIGHT HIP   . DIVERTICULOSIS, COLON   . ERECTILE DYSFUNCTION   . GERD   . HIATAL HERNIA   . HTN (hypertension)   . Hyperlipidemia    statin intolerant  . HYPERPLASIA PROSTATE UNS W/O UR  OBST & OTH LUTS   . LUMBAR RADICULOPATHY, RIGHT   . Other abnormal glucose   . PAD (peripheral artery disease) ([place])    pre CABG 04/2012 => ABIs: Right 0.92, left 0.61 // ABI 09/02/14: R 0.93; L 0.82  . SPINAL STENOSIS, LUMBAR     Current Outpatient Prescriptions on File Prior to Visit  Medication Sig Dispense Refill  . amLODipine (NORVASC) 10 MG tablet Take 1 tablet (10 mg total) by mouth daily. 30 tablet 1  . clopidogrel (PLAVIX) 75 MG tablet Take 1 tablet (75 mg total) by mouth daily. 90 tablet 3  . clopidogrel (PLAVIX) 75 MG tablet Take 1 tablet (75 mg total) by mouth daily. 7 tablet 0  . darifenacin (ENABLEX) 7.5 MG 24 hr tablet Take 1 tablet (7.5 mg total) by mouth daily. 30 tablet 0  . fluticasone (FLONASE) 50 MCG/ACT nasal spray Place 2 sprays into both nostrils  daily. 16 g 6  . metoprolol succinate (TOPROL-XL) 25 MG 24 hr tablet TAKE ONE TABLET BY MOUTH EVERY DAY 30 tablet 11  . Multiple Vitamin (MULTIVITAMIN WITH MINERALS) TABS Take 1 tablet by mouth daily. Centrum silver    . nitroGLYCERIN (NITROSTAT) 0.4 MG SL tablet Place 1 tablet (0.4 mg total) under the tongue every 5 (five) minutes as needed for chest pain (3 doses max). 25 tablet 3  . ranitidine (ZANTAC) 150 MG capsule Take 150 mg by mouth 2 (two) times daily as needed for heartburn.    . rosuvastatin (CRESTOR) 20 MG tablet Take 1 tablet (20 mg total) by mouth daily. 30 tablet 11  . tamsulosin (FLOMAX) 0.4 MG CAPS capsule Take 0.4 mg by mouth daily.     No current facility-administered medications on file prior to visit.          Allergies  Allergen Reactions  . Olmesartan Medoxomil Rash  . Quinapril Hcl Other (See Comments)     cough; ACE inhibitors are contraindicated because of a history of rash with Angiotensin receptor blocker.  . Glucosamine Other (See Comments)    Doesn't remember reaction  . Livalo [Pitavastatin]     cramps  . Rosuvastatin Other (See Comments)    : muscle cramps  . Verapamil Other (See Comments)    Extra sensitive to sun    Assessment/Plan:  1. Hyperlipidemia - LDL above goal < 70 mg/dL on Zetia 10mg  every other day. Will start Crestor 10mg  every other day and continue Zetia 10mg  every other day. Instructed pt to call if he is unable to tolerate this dose as we can decrease it to 5mg  or decrease the 10mg  dose's frequency. Pt did not tolerate Zetia 10mg  daily so will keep every other day for now. Much of visit spent discussing medication adherence and sorting through patient confusion regarding medications. Follow up lipid panel in 3 months.  Melburn Popper, PharmD, [place] PGY2 Pharmacy Resident  [place] Supple, PharmD, CPP, [place] [address] [place], [place] [address] Phone: [phone];  Fax: [phone]

## 2016-07-12 ENCOUNTER — Telehealth: Payer: Self-pay | Admitting: Cardiovascular Disease

## 2016-07-12 NOTE — Telephone Encounter (Signed)
Called Caryl Pina with [employer]. Patient was on Zetia 10 mg daily, but at last lipid clinic visit it was changed to Zetia 10 mg every other day. Caryl Pina updated changes and had no other questions.

## 2016-07-12 NOTE — Telephone Encounter (Signed)
New message      ezetimibe (ZETIA) 10 MG tablet Take 1 tablet by mouth every other day    Needs to know what instructions they are to follow ? They received prescription for 1 every day?

## 2016-08-14 ENCOUNTER — Other Ambulatory Visit: Payer: Self-pay | Admitting: Physician Assistant

## 2016-10-05 ENCOUNTER — Other Ambulatory Visit: Payer: BC Managed Care – PPO | Admitting: *Deleted

## 2016-10-05 DIAGNOSIS — E7849 Other hyperlipidemia: Secondary | ICD-10-CM

## 2016-10-05 LAB — LIPID PANEL
CHOL/HDL RATIO: 2.6 ratio (ref 0.0–5.0)
Cholesterol, Total: 157 mg/dL (ref 100–199)
HDL: 60 mg/dL (ref >39)
LDL Calculated: 86 mg/dL (ref 0–99)
TRIGLYCERIDES: 54 mg/dL (ref 0–149)
VLDL Cholesterol Cal: 11 mg/dL (ref 5–40)

## 2016-10-05 LAB — HEPATIC FUNCTION PANEL
ALBUMIN: 4.1 g/dL (ref 3.6–4.8)
ALT: 20 IU/L (ref 0–44)
AST: 23 IU/L (ref 0–40)
Alkaline Phosphatase: 76 IU/L (ref 39–117)
BILIRUBIN TOTAL: 0.2 mg/dL (ref 0.0–1.2)
BILIRUBIN, DIRECT: 0.09 mg/dL (ref 0.00–0.40)
TOTAL PROTEIN: 7 g/dL (ref 6.0–8.5)

## 2017-01-01 ENCOUNTER — Encounter: Payer: Self-pay | Admitting: Internal Medicine

## 2017-01-01 ENCOUNTER — Ambulatory Visit: Payer: BC Managed Care – PPO | Admitting: Internal Medicine

## 2017-01-01 VITALS — BP 146/70 | HR 86 | Temp 98.6°F | Ht 74.0 in | Wt 204.2 lb

## 2017-01-01 DIAGNOSIS — H9222 Otorrhagia, left ear: Secondary | ICD-10-CM | POA: Diagnosis not present

## 2017-01-01 DIAGNOSIS — H938X2 Other specified disorders of left ear: Secondary | ICD-10-CM | POA: Diagnosis not present

## 2017-01-01 DIAGNOSIS — H6122 Impacted cerumen, left ear: Secondary | ICD-10-CM | POA: Diagnosis not present

## 2017-01-01 NOTE — Patient Instructions (Signed)
Pt instructed to b[put nothing in ear and return if recurs or bleeding

## 2017-01-01 NOTE — Progress Notes (Signed)
Chief Complaint  Patient presents with  . Acute Visit    ear px    HPI: Eric Dickerson 66 y.o.  SDA PCP NA [place]  Friday and now  Hard to hear.  Feels like  Wax in ear   noi pain  But does get sinus stuff issues .  No fever    No q tips nothing inear and no itching  Hx of cleaning wax.   No pain .  ROS: See pertinent positives and negatives per HPI.  Past Medical History:  Diagnosis Date  . ANEMIA, MILD   . CAD (coronary artery disease)    a. CAD,  b. s/p Cypher DES to the ramus and Promus DES x 2 to the RCA 08/2007 (minimal disease in LAD and CFX at that time), c. Myoview 8/10: EF 63%, inf thinning, small prior ant infarct, no ischemia;  d. s/p CABG 3/14 (L-LAD, RIMA-OM1)  . DEGENERATIVE JOINT DISEASE, RIGHT HIP   . DIVERTICULOSIS, COLON   . ERECTILE DYSFUNCTION   . GERD   . HIATAL HERNIA   . HTN (hypertension)   . Hyperlipidemia    statin intolerant  . HYPERPLASIA PROSTATE UNS W/O UR OBST & OTH LUTS   . LUMBAR RADICULOPATHY, RIGHT   . Other abnormal glucose   . PAD (peripheral artery disease) ([place])    pre CABG 04/2012 => ABIs: Right 0.92, left 0.61 // ABI 09/02/14: R 0.93; L 0.82  . SPINAL STENOSIS, LUMBAR     Family History  Problem Relation Age of Onset  . Hypertension Father   . Heart attack Father 21       smoker  . Hyperlipidemia Mother   . Diabetes Maternal Grandmother   . Stroke Brother        2 brothers ; 95 & 78  . Hyperlipidemia Brother   . Pulmonary embolism Brother   . Heart attack Brother 76  . Cancer Neg Hx   . COPD Neg Hx     Social History   Socioeconomic History  . Marital status: Married    Spouse name: None  . Number of children: 4  . Years of education: 4  . Highest education level: None  Social Needs  . Financial resource strain: None  . Food insecurity - worry: None  . Food insecurity - inability: None  . Transportation needs - medical: None  . Transportation needs - non-medical: None  Occupational History  . Occupation:  Facilities Management  Tobacco Use  . Smoking status: Former Smoker    Packs/day: 1.50    Years: 12.00    Pack years: 18.00    Last attempt to quit: 02/06/1985    Years since quitting: 31.9  . Smokeless tobacco: Never Used  . Tobacco comment: smoked 1967-1987, up to 1.5 ppd  Substance and Sexual Activity  . Alcohol use: No  . Drug use: No  . Sexual activity: None  Other Topics Concern  . None  Social History Narrative   Denies abuse and feels safe at home.     Outpatient Medications Prior to Visit  Medication Sig Dispense Refill  . amLODipine (NORVASC) 10 MG tablet TAKE ONE TABLET BY MOUTH EVERY DAY 30 tablet 9  . clopidogrel (PLAVIX) 75 MG tablet Take 1 tablet (75 mg total) by mouth daily. 90 tablet 3  . ezetimibe (ZETIA) 10 MG tablet Take 1 tablet by mouth every other day 45 tablet 3  . fluticasone (FLONASE) 50 MCG/ACT nasal spray Place 2 sprays  into both nostrils daily. 16 g 6  . metoprolol succinate (TOPROL-XL) 25 MG 24 hr tablet TAKE ONE TABLET BY MOUTH EVERY DAY 30 tablet 5  . Multiple Vitamin (MULTIVITAMIN WITH MINERALS) TABS Take 1 tablet by mouth daily. Centrum silver    . nitroGLYCERIN (NITROSTAT) 0.4 MG SL tablet PLACE 1 TABLET UNDER THE TONGUE EVER Y 5 MINUTES AS NEEDED FOR CHEST PAIN (3 DOSES MAXIMUM) 25 tablet 5  . ranitidine (ZANTAC) 150 MG capsule Take 150 mg by mouth 2 (two) times daily as needed for heartburn.    . rosuvastatin (CRESTOR) 10 MG tablet Take 1 tablet by mouth every other day. 45 tablet 3  . tamsulosin (FLOMAX) 0.4 MG CAPS capsule Take 0.4 mg by mouth daily.    Marland Kitchen darifenacin (ENABLEX) 7.5 MG 24 hr tablet Take 1 tablet (7.5 mg total) by mouth daily. 30 tablet 0   No facility-administered medications prior to visit.      EXAM:  BP (!) 146/70   Pulse 86   Temp 98.6 F (37 C) (Oral)   Ht 6\' 2"  (1.88 m)   Wt 204 lb 3.2 oz (92.6 kg)   SpO2 97%   BMI 26.22 kg/m   Body mass index is 26.22 kg/m.  GENERAL: vitals reviewed and listed above,  alert, oriented, appears well hydrated and in no acute distress HEENT: atraumatic, conjunctiva  clear, no obvious abnormalities on inspection of external nose and ears  r toim ok min wax  Left eac brown wax plug with drop of blood on  Distal canal inferiorly   Tm  Part seems intat  No exudate   OP : no lesion edema or exudate  Dentures    NECK: no obvious masses on inspection palpation  MS: moves all extremities without noticeable focal  abnormality PSYCH: pleasant and cooperative, no obvious depression or anxiety  After irrigation gentle  Canal clear  And tim intacts small amt whit falky area   Plug of ear wax with  Blood clot removed and no other  Blood in canal seen  Pt can hear better  ASSESSMENT AND PLAN:  Discussed the following assessment and plan:  Sensation of plugged ear on left side - better after  removed wax and blood  Impacted cerumen of left ear  Blood in ear canal, left Risk benefit  Gentle irrigation   Left ear     With caution   Blood clot in ear    Removed   ? Cause  No fb seen and no  Pain  -Patient advised to return or notify health care team  if symptoms worsen ,persist or new concerns arise.  Patient Instructions  Pt instructed to b[put nothing in ear and return if recurs or bleeding    [name] M.D.

## 2017-01-01 NOTE — Progress Notes (Signed)
Pre visit review using our clinic review tool, if applicable. No additional management support is needed unless otherwise documented below in the visit note. 

## 2017-01-25 ENCOUNTER — Telehealth: Payer: Self-pay | Admitting: Cardiovascular Disease

## 2017-01-25 NOTE — Telephone Encounter (Signed)
Walk in pt Form-[place] [employer] paperwork dropped off. Placed in [place]

## 2017-02-05 ENCOUNTER — Other Ambulatory Visit: Payer: Self-pay | Admitting: Cardiovascular Disease

## 2017-02-12 ENCOUNTER — Telehealth: Payer: Self-pay | Admitting: Cardiovascular Disease

## 2017-02-12 NOTE — Telephone Encounter (Signed)
Called patient he is aware Disability Parking Pla-Card paper ready,signed,completed by Dr.Nishan. Pt stated he will pick up Tuesday. Put at front desk for patient.

## 2017-03-01 ENCOUNTER — Other Ambulatory Visit (INDEPENDENT_AMBULATORY_CARE_PROVIDER_SITE_OTHER): Payer: BC Managed Care – PPO

## 2017-03-01 ENCOUNTER — Ambulatory Visit: Payer: BC Managed Care – PPO | Admitting: Internal Medicine

## 2017-03-01 ENCOUNTER — Encounter: Payer: Self-pay | Admitting: Internal Medicine

## 2017-03-01 VITALS — BP 124/82 | HR 70 | Temp 98.6°F | Ht 74.0 in | Wt 207.0 lb

## 2017-03-01 DIAGNOSIS — I2510 Atherosclerotic heart disease of native coronary artery without angina pectoris: Secondary | ICD-10-CM | POA: Insufficient documentation

## 2017-03-01 DIAGNOSIS — Z0000 Encounter for general adult medical examination without abnormal findings: Secondary | ICD-10-CM

## 2017-03-01 DIAGNOSIS — R972 Elevated prostate specific antigen [PSA]: Secondary | ICD-10-CM

## 2017-03-01 DIAGNOSIS — R7309 Other abnormal glucose: Secondary | ICD-10-CM

## 2017-03-01 DIAGNOSIS — I10 Essential (primary) hypertension: Secondary | ICD-10-CM | POA: Diagnosis not present

## 2017-03-01 DIAGNOSIS — E785 Hyperlipidemia, unspecified: Secondary | ICD-10-CM

## 2017-03-01 LAB — BASIC METABOLIC PANEL
BUN: 21 mg/dL (ref 6–23)
CHLORIDE: 104 meq/L (ref 96–112)
CO2: 30 meq/L (ref 19–32)
Calcium: 9.7 mg/dL (ref 8.4–10.5)
Creatinine, Ser: 1.03 mg/dL (ref 0.40–1.50)
GFR: 92.48 mL/min (ref >60.00)
GLUCOSE: 83 mg/dL (ref 70–99)
Potassium: 4 mEq/L (ref 3.5–5.1)
SODIUM: 140 meq/L (ref 135–145)

## 2017-03-01 LAB — URINALYSIS, ROUTINE W REFLEX MICROSCOPIC
Bilirubin Urine: NEGATIVE
Hgb urine dipstick: NEGATIVE
Leukocytes, UA: NEGATIVE
Nitrite: NEGATIVE
PH: 6 (ref 5.0–8.0)
RBC / HPF: NONE SEEN (ref 0–?)
SPECIFIC GRAVITY, URINE: 1.025 (ref 1.000–1.030)
Total Protein, Urine: NEGATIVE
URINE GLUCOSE: NEGATIVE
Urobilinogen, UA: 0.2 (ref 0.0–1.0)

## 2017-03-01 LAB — CBC WITH DIFFERENTIAL/PLATELET
BASOS PCT: 1.1 % (ref 0.0–3.0)
Basophils Absolute: 0.1 10*3/uL (ref 0.0–0.1)
EOS PCT: 1.8 % (ref 0.0–5.0)
Eosinophils Absolute: 0.1 10*3/uL (ref 0.0–0.7)
HCT: 39.8 % (ref 39.0–52.0)
HEMOGLOBIN: 13 g/dL (ref 13.0–17.0)
Lymphocytes Relative: 26.8 % (ref 12.0–46.0)
Lymphs Abs: 1.8 10*3/uL (ref 0.7–4.0)
MCHC: 32.6 g/dL (ref 30.0–36.0)
MCV: 85.8 fl (ref 78.0–100.0)
Monocytes Absolute: 0.8 10*3/uL (ref 0.1–1.0)
Monocytes Relative: 11.5 % (ref 3.0–12.0)
Neutro Abs: 4 10*3/uL (ref 1.4–7.7)
Neutrophils Relative %: 58.8 % (ref 43.0–77.0)
Platelets: 241 10*3/uL (ref 150.0–400.0)
RBC: 4.64 Mil/uL (ref 4.22–5.81)
RDW: 14.1 % (ref 11.5–15.5)
WBC: 6.9 10*3/uL (ref 4.0–10.5)

## 2017-03-01 LAB — HEPATIC FUNCTION PANEL
ALBUMIN: 4.2 g/dL (ref 3.5–5.2)
ALT: 15 U/L (ref 0–53)
AST: 17 U/L (ref 0–37)
Alkaline Phosphatase: 74 U/L (ref 39–117)
BILIRUBIN TOTAL: 0.3 mg/dL (ref 0.2–1.2)
Bilirubin, Direct: 0.1 mg/dL (ref 0.0–0.3)
TOTAL PROTEIN: 7.4 g/dL (ref 6.0–8.3)

## 2017-03-01 LAB — LIPID PANEL
Cholesterol: 142 mg/dL (ref 0–200)
HDL: 58 mg/dL (ref >39.00)
LDL CALC: 74 mg/dL (ref 0–99)
NONHDL: 84.39
TRIGLYCERIDES: 52 mg/dL (ref 0.0–149.0)
Total CHOL/HDL Ratio: 2
VLDL: 10.4 mg/dL (ref 0.0–40.0)

## 2017-03-01 LAB — TSH: TSH: 2.75 u[IU]/mL (ref 0.35–4.50)

## 2017-03-01 LAB — PSA: PSA: 3.88 ng/mL (ref 0.10–4.00)

## 2017-03-01 LAB — HEMOGLOBIN A1C: Hgb A1c MFr Bld: 6.2 % (ref 4.6–6.5)

## 2017-03-01 NOTE — Assessment & Plan Note (Signed)
stable overall by history and exam, recent data reviewed with pt, and pt to continue medical treatment as before,  to f/u any worsening symptoms or concerns BP Readings from Last 3 Encounters:  03/01/17 124/82  01/01/17 (!) 146/70  02/25/16 (!) 152/98

## 2017-03-01 NOTE — Assessment & Plan Note (Signed)
Goal LDL < 70, but unable to tolerate higher crestor, cont same tx

## 2017-03-01 NOTE — Assessment & Plan Note (Signed)
Also for f/u psa, asympt 

## 2017-03-01 NOTE — Assessment & Plan Note (Signed)
Asympt,  Lab Results  Component Value Date   HGBA1C 5.8 (H) 09/06/2012   stable overall by history and exam, recent data reviewed with pt, and pt to continue medical treatment as before,  to f/u any worsening symptoms or concerns

## 2017-03-01 NOTE — Progress Notes (Signed)
Subjective:    Patient ID: Eric Dickerson, male    DOB: 1950-10-19, 67 y.o.   MRN: [id number]  HPI  Here for wellness and f/u;  Overall doing ok;  Pt denies Chest pain, worsening SOB, DOE, wheezing, orthopnea, PND, worsening LE edema, palpitations, dizziness or syncope.  Pt denies neurological change such as new headache, facial or extremity weakness.  Pt denies polydipsia, polyuria, or low sugar symptoms. Pt states overall good compliance with treatment and medications, good tolerability, and has been trying to follow appropriate diet.  Pt denies worsening depressive symptoms, suicidal ideation or panic. No fever, night sweats, wt loss, loss of appetite, or other constitutional symptoms.  Pt states good ability with ADL's, has low fall risk, home safety reviewed and adequate, no other significant changes in hearing or vision, and only occasionally active with exercise. Declines flu and pneumonia shots or shingrix.  Has cramps to higher dose statins.  No other interval hx or complaints  Has failed 2 stating but able to tolerate the crestor 10 and zetia 10.  Admit to most days only taking half of his plavix as his wife was hospd for bleeding on this and he is wary of same. Past Medical History:  Diagnosis Date  . ANEMIA, MILD   . CAD (coronary artery disease)    a. CAD,  b. s/p Cypher DES to the ramus and Promus DES x 2 to the RCA 08/2007 (minimal disease in LAD and CFX at that time), c. Myoview 8/10: EF 63%, inf thinning, small prior ant infarct, no ischemia;  d. s/p CABG 3/14 (L-LAD, RIMA-OM1)  . DEGENERATIVE JOINT DISEASE, RIGHT HIP   . DIVERTICULOSIS, COLON   . ERECTILE DYSFUNCTION   . GERD   . HIATAL HERNIA   . HTN (hypertension)   . Hyperlipidemia    statin intolerant  . HYPERPLASIA PROSTATE UNS W/O UR OBST & OTH LUTS   . LUMBAR RADICULOPATHY, RIGHT   . Other abnormal glucose   . PAD (peripheral artery disease) ([place])    pre CABG 04/2012 => ABIs: Right 0.92, left 0.61 // ABI 09/02/14: R  0.93; L 0.82  . SPINAL STENOSIS, LUMBAR    Past Surgical History:  Procedure Laterality Date  . COLONOSCOPY  2002   negative  . CORONARY ARTERY BYPASS GRAFT N/A 04/22/2012   Procedure: CORONARY ARTERY BYPASS GRAFTING (CABG);  Surgeon: [name], MD;  Location: [facility];  Service: Open Heart Surgery;  Laterality: N/A;  CABG X 2, BIMA, POSSIBLE EVH  . CORONARY STENT PLACEMENT  2010    X 2  . TEE WITHOUT CARDIOVERSION N/A 04/22/2012   Procedure: TRANSESOPHAGEAL ECHOCARDIOGRAM (TEE);  Surgeon: [name], MD;  Location: [facility];  Service: Open Heart Surgery;  Laterality: N/A;  . TOTAL HIP ARTHROPLASTY  2009    reports that he quit smoking about 32 years ago. He has a 18.00 pack-year smoking history. he has never used smokeless tobacco. He reports that he does not drink alcohol or use drugs. family history includes Diabetes in his maternal grandmother; Heart attack (age of onset: 84) in his father; Heart attack (age of onset: 59) in his brother; Hyperlipidemia in his brother and mother; Hypertension in his father; Pulmonary embolism in his brother; Stroke in his brother. Allergies  Allergen Reactions  . Olmesartan Medoxomil Rash  . Quinapril Hcl Other (See Comments)     cough; ACE inhibitors are contraindicated because of a history of rash with Angiotensin receptor blocker.  . Glucosamine  Other (See Comments)    Doesn't remember reaction  . Livalo [Pitavastatin]     cramps  . Rosuvastatin Other (See Comments)    : muscle cramps  . Verapamil Other (See Comments)    Extra sensitive to sun   Current Outpatient Medications on File Prior to Visit  Medication Sig Dispense Refill  . amLODipine (NORVASC) 10 MG tablet TAKE ONE TABLET BY MOUTH EVERY DAY 30 tablet 0  . clopidogrel (PLAVIX) 75 MG tablet Take 1 tablet (75 mg total) by mouth daily. 90 tablet 3  . darifenacin (ENABLEX) 7.5 MG 24 hr tablet Take 1 tablet (7.5 mg total) by mouth daily. 30 tablet 0  . ezetimibe (ZETIA) 10  MG tablet Take 1 tablet by mouth every other day 45 tablet 3  . fluticasone (FLONASE) 50 MCG/ACT nasal spray Place 2 sprays into both nostrils daily. 16 g 6  . metoprolol succinate (TOPROL-XL) 25 MG 24 hr tablet TAKE ONE TABLET BY MOUTH ONCE A DAY 60 tablet 0  . Multiple Vitamin (MULTIVITAMIN WITH MINERALS) TABS Take 1 tablet by mouth daily. Centrum silver    . nitroGLYCERIN (NITROSTAT) 0.4 MG SL tablet PLACE 1 TABLET UNDER THE TONGUE EVER Y 5 MINUTES AS NEEDED FOR CHEST PAIN (3 DOSES MAXIMUM) 25 tablet 5  . ranitidine (ZANTAC) 150 MG capsule Take 150 mg by mouth 2 (two) times daily as needed for heartburn.    . rosuvastatin (CRESTOR) 10 MG tablet Take 1 tablet by mouth every other day. 45 tablet 3  . tamsulosin (FLOMAX) 0.4 MG CAPS capsule Take 0.4 mg by mouth daily.     No current facility-administered medications on file prior to visit.    Review of Systems Constitutional: Negative for other unusual diaphoresis, sweats, appetite or weight changes HENT: Negative for other worsening hearing loss, ear pain, facial swelling, mouth sores or neck stiffness.   Eyes: Negative for other worsening pain, redness or other visual disturbance.  Respiratory: Negative for other stridor or swelling Cardiovascular: Negative for other palpitations or other chest pain  Gastrointestinal: Negative for worsening diarrhea or loose stools, blood in stool, distention or other pain Genitourinary: Negative for hematuria, flank pain or other change in urine volume.  Musculoskeletal: Negative for myalgias or other joint swelling.  Skin: Negative for other color change, or other wound or worsening drainage.  Neurological: Negative for other syncope or numbness. Hematological: Negative for other adenopathy or swelling Psychiatric/Behavioral: Negative for hallucinations, other worsening agitation, SI, self-injury, or new decreased concentration All other system neg per pt    Objective:   Physical Exam BP 124/82    Pulse 70   Temp 98.6 F (37 C) (Oral)   Ht 6\' 2"  (1.88 m)   Wt 207 lb (93.9 kg)   SpO2 98%   BMI 26.58 kg/m  VS noted,  Constitutional: Pt is oriented to person, place, and time. Appears well-developed and well-nourished, in no significant distress and comfortable Head: Normocephalic and atraumatic  Eyes: Conjunctivae and EOM are normal. Pupils are equal, round, and reactive to light Right Ear: External ear normal without discharge Left Ear: External ear normal without discharge Nose: Nose without discharge or deformity Mouth/Throat: Oropharynx is without other ulcerations and moist  Neck: Normal range of motion. Neck supple. No JVD present. No tracheal deviation present or significant neck LA or mass Cardiovascular: Normal rate, regular rhythm, normal heart sounds and intact distal pulses.   Pulmonary/Chest: WOB normal and breath sounds without rales or wheezing  Abdominal: Soft. Bowel sounds  are normal. NT. No HSM  Musculoskeletal: Normal range of motion. Exhibits no edema Lymphadenopathy: Has no other cervical adenopathy.  Neurological: Pt is alert and oriented to person, place, and time. Pt has normal reflexes. No cranial nerve deficit. Motor grossly intact, Gait intact Skin: Skin is warm and dry. No rash noted or new ulcerations Psychiatric:  Has normal mood and affect. Behavior is normal without agitation No other exam findings    Assessment & Plan:

## 2017-03-01 NOTE — Assessment & Plan Note (Signed)

## 2017-03-01 NOTE — Patient Instructions (Addendum)
/  Please continue all other medications as before, and refills have been done if requested.  Please have the pharmacy call with any other refills you may need.  Please continue your efforts at being more active, low cholesterol diet, and weight control.  You are otherwise up to date with prevention measures today.  You will be contacted regarding the referral for: colonoscopy  Please keep your appointments with your specialists as you may have planned  Please go to the LAB in the Basement (turn left off the elevator) for the tests to be done today  You will be contacted by phone if any changes need to be made immediately.  Otherwise, you will receive a letter about your results with an explanation, but please check with MyChart first.  Please remember to sign up for MyChart if you have not done so, as this will be important to you in the future with finding out test results, communicating by private email, and scheduling acute appointments online when needed.  Please return in 1 year for your yearly visit, or sooner if needed, with Lab testing done 3-5 days before

## 2017-04-20 ENCOUNTER — Other Ambulatory Visit: Payer: Self-pay | Admitting: Cardiovascular Disease

## 2017-05-04 ENCOUNTER — Other Ambulatory Visit: Payer: Self-pay | Admitting: Cardiovascular Disease

## 2017-05-08 ENCOUNTER — Other Ambulatory Visit: Payer: Self-pay | Admitting: Cardiovascular Disease

## 2017-05-14 ENCOUNTER — Encounter: Payer: Self-pay | Admitting: Internal Medicine

## 2017-05-16 ENCOUNTER — Other Ambulatory Visit: Payer: Self-pay | Admitting: Cardiovascular Disease

## 2017-05-28 ENCOUNTER — Encounter: Payer: Self-pay | Admitting: Gastroenterology

## 2017-05-29 ENCOUNTER — Other Ambulatory Visit: Payer: Self-pay | Admitting: Cardiovascular Disease

## 2017-05-31 ENCOUNTER — Other Ambulatory Visit: Payer: Self-pay | Admitting: Cardiovascular Disease

## 2017-06-04 ENCOUNTER — Other Ambulatory Visit: Payer: Self-pay | Admitting: Cardiovascular Disease

## 2017-06-05 ENCOUNTER — Other Ambulatory Visit: Payer: Self-pay | Admitting: Cardiovascular Disease

## 2017-06-18 ENCOUNTER — Other Ambulatory Visit: Payer: Self-pay | Admitting: Cardiovascular Disease

## 2017-06-22 ENCOUNTER — Encounter: Payer: Self-pay | Admitting: Cardiology

## 2017-07-16 ENCOUNTER — Ambulatory Visit: Payer: BC Managed Care – PPO | Admitting: Cardiology

## 2017-07-16 ENCOUNTER — Encounter: Payer: Self-pay | Admitting: Cardiology

## 2017-07-16 VITALS — BP 138/82 | HR 87 | Ht 74.0 in | Wt 207.0 lb

## 2017-07-16 DIAGNOSIS — E782 Mixed hyperlipidemia: Secondary | ICD-10-CM

## 2017-07-16 DIAGNOSIS — I10 Essential (primary) hypertension: Secondary | ICD-10-CM

## 2017-07-16 DIAGNOSIS — I2510 Atherosclerotic heart disease of native coronary artery without angina pectoris: Secondary | ICD-10-CM

## 2017-07-16 NOTE — Progress Notes (Signed)
Cardiology Office Note   Date:  07/16/2017   ID:  Eric Dickerson, DOB Jun 16, 1950, MRN [id number]  PCP:  [name], MD  Cardiologist:  Dr. Jairo Ben Dr Fletcher Anon  Chief Complaint  Patient presents with  . Coronary Artery Disease      History of Present Illness: Eric Dickerson is a 67 y.o. male who presents for CAD.    He has a hx of CAD, s/p Cypher DES to the ramus and Promus DES x 2 to the RCA 08/2007 (minimal disease in LAD and CFX at that time), PAD, HL, HTN. He developed progressively worsening chest pain in 3/14 and LHC demonstrated 3 v CAD.  He was referred for CABG by Dr. Roxan Hockey 04/22/12 (LIMA-LAD, free RIMA-OM1).    Cannot take asa due to GERD, needs plavix,     ? PSK9 candidate. Currently tolerating the crestor and zetia with LDL of 74- alternates them every other day. He has only been taking half of his plavix, pt taking this way he is afraid of internal bleeding.    Today he has no complaints, no chest pain and no SOB.  No claudication but has knee pain and some lower back pain.   He walks a lot at work.  Tries to eat healthy.    Past Medical History:  Diagnosis Date  . ANEMIA, MILD   . CAD (coronary artery disease)    a. CAD,  b. s/p Cypher DES to the ramus and Promus DES x 2 to the RCA 08/2007 (minimal disease in LAD and CFX at that time), c. Myoview 8/10: EF 63%, inf thinning, small prior ant infarct, no ischemia;  d. s/p CABG 3/14 (L-LAD, RIMA-OM1)  . DEGENERATIVE JOINT DISEASE, RIGHT HIP   . DIVERTICULOSIS, COLON   . ERECTILE DYSFUNCTION   . GERD   . HIATAL HERNIA   . HTN (hypertension)   . Hyperlipidemia    statin intolerant  . HYPERPLASIA PROSTATE UNS W/O UR OBST & OTH LUTS   . LUMBAR RADICULOPATHY, RIGHT   . Other abnormal glucose   . PAD (peripheral artery disease) ([place])    pre CABG 04/2012 => ABIs: Right 0.92, left 0.61 // ABI 09/02/14: R 0.93; L 0.82  . SPINAL STENOSIS, LUMBAR     Past Surgical History:  Procedure Laterality Date  .  COLONOSCOPY  2002   negative  . CORONARY ARTERY BYPASS GRAFT N/A 04/22/2012   Procedure: CORONARY ARTERY BYPASS GRAFTING (CABG);  Surgeon: [name], MD;  Location: [place];  Service: Open Heart Surgery;  Laterality: N/A;  CABG X 2, BIMA, POSSIBLE EVH  . CORONARY STENT PLACEMENT  2010    X 2  . TEE WITHOUT CARDIOVERSION N/A 04/22/2012   Procedure: TRANSESOPHAGEAL ECHOCARDIOGRAM (TEE);  Surgeon: [name], MD;  Location: [place] [facility];  Service: Open Heart Surgery;  Laterality: N/A;  . TOTAL HIP ARTHROPLASTY  2009     Current Outpatient Medications  Medication Sig Dispense Refill  . amLODipine (NORVASC) 10 MG tablet Take 1 tablet (10 mg total) by mouth daily. Please keep upcoming appt for more refills 30 tablet 0  . clopidogrel (PLAVIX) 75 MG tablet Take 1 tablet (75 mg total) by mouth daily. Please keep upcoming appt for more refills 30 tablet 0  . ezetimibe (ZETIA) 10 MG tablet TAKE 1 TABLET BY MOUTH EVERY OTHER DAY 45 tablet 0  . fluticasone (FLONASE) 50 MCG/ACT nasal spray Place 2 sprays into both nostrils daily. [place]  g 6  . metoprolol succinate (TOPROL-XL) 25 MG 24 hr tablet Take 1 tablet (25 mg total) by mouth daily. Please make appt for future refills 1st attempt 30 tablet 0  . Multiple Vitamin (MULTIVITAMIN WITH MINERALS) TABS Take 1 tablet by mouth daily. Centrum silver    . nitroGLYCERIN (NITROSTAT) 0.4 MG SL tablet PLACE 1 TABLET UNDER THE TONGUE EVER Y 5 MINUTES AS NEEDED FOR CHEST PAIN (3 DOSES MAXIMUM) 25 tablet 0  . ranitidine (ZANTAC) 150 MG capsule Take 150 mg by mouth 2 (two) times daily as needed for heartburn.    . rosuvastatin (CRESTOR) 10 MG tablet Take 1 tablet by mouth every other day. 45 tablet 3  . tamsulosin (FLOMAX) 0.4 MG CAPS capsule Take 0.4 mg by mouth daily.     No current facility-administered medications for this visit.     Allergies:   Olmesartan medoxomil; Quinapril hcl; Glucosamine; Livalo [pitavastatin]; Rosuvastatin; and Verapamil     Social History:  The patient  reports that he quit smoking about 32 years ago. He has a 18.00 pack-year smoking history. He has never used smokeless tobacco. He reports that he does not drink alcohol or use drugs.   Family History:  The patient's family history includes Diabetes in his maternal grandmother; Heart attack (age of onset: 63) in his father; Heart attack (age of onset: 15) in his brother; Hyperlipidemia in his brother and mother; Hypertension in his father; Pulmonary embolism in his brother; Stroke in his brother.    ROS:  General:no colds or fevers, no weight changes Skin:no rashes or ulcers HEENT:no blurred vision, no congestion CV:see HPI PUL:see HPI GI:no diarrhea constipation or melena, no indigestion GU:no hematuria, no dysuria MS:no joint pain, no claudication Neuro:no syncope, no lightheadedness Endo:no diabetes, no thyroid disease  Wt Readings from Last 3 Encounters:  07/16/17 207 lb (93.9 kg)  03/01/17 207 lb (93.9 kg)  01/01/17 204 lb 3.2 oz (92.6 kg)     PHYSICAL EXAM: VS:  BP 138/82   Pulse 87   Ht 6\' 2"  (1.88 m)   Wt 207 lb (93.9 kg)   SpO2 97%   BMI 26.58 kg/m  , BMI Body mass index is 26.58 kg/m. General:Pleasant affect, NAD Skin:Warm and dry, brisk capillary refill HEENT:normocephalic, sclera clear, mucus membranes moist Neck:supple, no JVD, no bruits  Heart:S1S2 RRR with soft systolic murmur, no gallup, rub or click Lungs:clear without rales, rhonchi, or wheezes TWS:FKCL, non tender, + BS, do not palpate liver spleen or masses Ext:no lower ext edema, 2+ pedal pulses, 2+ radial pulses Neuro:alert and oriented X 3, MAE, follows commands, + facial symmetry    EKG:  EKG is ordered today. The ekg ordered today demonstrates SR at 72 RBBB and no acute changes.     Recent Labs: 03/01/2017: ALT 15; BUN 21; Creatinine, Ser 1.03; Hemoglobin 13.0; Platelets 241.0; Potassium 4.0; Sodium 140; TSH 2.75    Lipid Panel    Component Value  Date/Time   CHOL 142 03/01/2017 1438   CHOL 157 10/05/2016 0830   TRIG 52.0 03/01/2017 1438   HDL 58.00 03/01/2017 1438   HDL 60 10/05/2016 0830   CHOLHDL 2 03/01/2017 1438   VLDL 10.4 03/01/2017 1438   LDLCALC 74 03/01/2017 1438   LDLCALC 86 10/05/2016 0830   LDLDIRECT 130.4 11/17/2009 0853       Other studies Reviewed: Additional studies/ records that were reviewed today include: Marland Kitchen  Myoview 8/10: EF 63%, inf thinning, small prior ant infarct, no ischemia.  LHC 04/15/12: Distal left main 20%, ostial LAD 80%, mid LAD 40% multiple discrete lesions, ramus intermediate patent stent, ostial circumflex 95%, proximal and mid RCA stents patent, PDA 40%, EF 55%.  Preop carotid Dopplers were negative for significant ICA stenosis bilaterally.  09/01/14   ABIs: Right 0.92, left 0.82.  ASSESSMENT AND PLAN:  1.  CAD s/p CABG and prior to cabg  hx of stents to RCA stable no angina or EKG changes.  Cannot take ASA and takes half a plavix daily - he is afraid to take more due to fear of internal bleeding.   Follow up with Dr. Johnsie Cancel in 1 year.    2.   HLD on alternating zetia and crestor  3.    PAD denies claudication currently  4.   HTN controlled    Current medicines are reviewed with the patient today.  The patient Has no concerns regarding medicines.  The following changes have been made:  See above Labs/ tests ordered today include:see above  Disposition:   FU:  see above  Signed, Cecilie Kicks, NP  07/16/2017 3:25 PM    [facility] [place], [place], [place], [place] Phone: [phone]; Fax: [phone]

## 2017-07-16 NOTE — Patient Instructions (Signed)
Medication Instructions:   Your physician recommends that you continue on your current medications as directed. Please refer to the Current Medication list given to you today.   If you need a refill on your cardiac medications before your next appointment, please call your pharmacy.  Labwork: NONE ORDERED  TODAY    Testing/Procedures: NONE ORDERED  TODAY    Follow-Up: Your physician wants you to follow-up in: [place] will receive a reminder letter in the mail two months in advance. If you don't receive a letter, please call our office to schedule the follow-up appointment.     Any Other Special Instructions Will Be Listed Below (If Applicable).

## 2017-07-17 ENCOUNTER — Telehealth: Payer: Self-pay

## 2017-07-17 ENCOUNTER — Ambulatory Visit: Payer: BC Managed Care – PPO | Admitting: Gastroenterology

## 2017-07-17 ENCOUNTER — Encounter: Payer: Self-pay | Admitting: Gastroenterology

## 2017-07-17 VITALS — BP 132/80 | HR 91 | Ht 74.0 in | Wt 206.0 lb

## 2017-07-17 DIAGNOSIS — I2510 Atherosclerotic heart disease of native coronary artery without angina pectoris: Secondary | ICD-10-CM | POA: Diagnosis not present

## 2017-07-17 DIAGNOSIS — Z1211 Encounter for screening for malignant neoplasm of colon: Secondary | ICD-10-CM

## 2017-07-17 MED ORDER — PEG-KCL-NACL-NASULF-NA ASC-C 140 G PO SOLR: 140.0000 g | ORAL | 0 refills | Status: DC

## 2017-07-17 NOTE — Progress Notes (Signed)
[facility] Consult Note:  History: Eric Dickerson 07/17/2017  Referring physician: [name], MD  Reason for consult/chief complaint: Recall colonoscopy (Patient on Plavix, No GI complaints)   Subjective  HPI:  This is a very pleasant 67 year old man here to discuss screening colonoscopy and his chronic antiplatelet therapy.  His last colonoscopy with Dr. [name] in April 2009 found no polyps.  He has coronary disease with prior stents and a subsequent CABG.  He is maintained on Plavix because aspirin worsens his heartburn. [name] denies dysphagia, odynophagia, nausea, vomiting, early satiety or weight loss.  ROS:  Review of Systems  Constitutional: Negative for appetite change and unexpected weight change.  HENT: Negative for mouth sores and voice change.   Eyes: Negative for pain and redness.  Respiratory: Negative for cough and shortness of breath.   Cardiovascular: Negative for chest pain and palpitations.  Genitourinary: Negative for dysuria and hematuria.  Musculoskeletal: Negative for arthralgias and myalgias.  Skin: Negative for pallor and rash.  Neurological: Negative for weakness and headaches.  Hematological: Negative for adenopathy.     Past Medical History: Past Medical History:  Diagnosis Date  . ANEMIA, MILD   . CAD (coronary artery disease)    a. CAD,  b. s/p Cypher DES to the ramus and Promus DES x 2 to the RCA 08/2007 (minimal disease in LAD and CFX at that time), c. Myoview 8/10: EF 63%, inf thinning, small prior ant infarct, no ischemia;  d. s/p CABG 3/14 (L-LAD, RIMA-OM1)  . DEGENERATIVE JOINT DISEASE, RIGHT HIP   . DIVERTICULOSIS, COLON   . ERECTILE DYSFUNCTION   . GERD   . HIATAL HERNIA   . HTN (hypertension)   . Hyperlipidemia    statin intolerant  . HYPERPLASIA PROSTATE UNS W/O UR OBST & OTH LUTS   . LUMBAR RADICULOPATHY, RIGHT   . Other abnormal glucose   . PAD (peripheral artery disease) ([place])    pre CABG 04/2012  => ABIs: Right 0.92, left 0.61 // ABI 09/02/14: R 0.93; L 0.82  . SPINAL STENOSIS, LUMBAR      Past Surgical History: Past Surgical History:  Procedure Laterality Date  . COLONOSCOPY  2002   negative  . CORONARY ARTERY BYPASS GRAFT N/A 04/22/2012   Procedure: CORONARY ARTERY BYPASS GRAFTING (CABG);  Surgeon: [name], MD;  Location: [place];  Service: Open Heart Surgery;  Laterality: N/A;  CABG X 2, BIMA, POSSIBLE EVH  . CORONARY STENT PLACEMENT  2010    X 2  . TEE WITHOUT CARDIOVERSION N/A 04/22/2012   Procedure: TRANSESOPHAGEAL ECHOCARDIOGRAM (TEE);  Surgeon: [name], MD;  Location: [place];  Service: Open Heart Surgery;  Laterality: N/A;  . TOTAL HIP ARTHROPLASTY  2009     Family History: Family History  Problem Relation Age of Onset  . Hypertension Father   . Heart attack Father 60       smoker  . Hyperlipidemia Mother   . Diabetes Maternal Grandmother   . Stroke Brother        2 brothers ; 34 & 4  . Hyperlipidemia Brother   . Pulmonary embolism Brother   . Heart attack Brother 44  . Cancer Neg Hx   . COPD Neg Hx    No known family history of colorectal cancer  Social History: Social History   Socioeconomic History  . Marital status: Married    Spouse name: Not on file  . Number of children: 4  . Years  of education: 75  . Highest education level: Not on file  Occupational History  . Occupation: Facilities Management  Social Needs  . Financial resource strain: Not on file  . Food insecurity:    Worry: Not on file    Inability: Not on file  . Transportation needs:    Medical: Not on file    Non-medical: Not on file  Tobacco Use  . Smoking status: Former Smoker    Packs/day: 1.50    Years: 12.00    Pack years: 18.00    Last attempt to quit: 02/06/1985    Years since quitting: 32.4  . Smokeless tobacco: Never Used  . Tobacco comment: smoked 1967-1987, up to 1.5 ppd  Substance and Sexual Activity  . Alcohol use: No  . Drug use: No    . Sexual activity: Not on file  Lifestyle  . Physical activity:    Days per week: Not on file    Minutes per session: Not on file  . Stress: Not on file  Relationships  . Social connections:    Talks on phone: Not on file    Gets together: Not on file    Attends religious service: Not on file    Active member of club or organization: Not on file    Attends meetings of clubs or organizations: Not on file    Relationship status: Not on file  Other Topics Concern  . Not on file  Social History Narrative   Denies abuse and feels safe at home.     Allergies: Allergies  Allergen Reactions  . Olmesartan Medoxomil Rash  . Quinapril Hcl Other (See Comments)     cough; ACE inhibitors are contraindicated because of a history of rash with Angiotensin receptor blocker.  . Glucosamine Other (See Comments)    Doesn't remember reaction  . Livalo [Pitavastatin]     cramps  . Rosuvastatin Other (See Comments)    : muscle cramps  . Verapamil Other (See Comments)    Extra sensitive to sun    Outpatient Meds: Current Outpatient Medications  Medication Sig Dispense Refill  . amLODipine (NORVASC) 10 MG tablet Take 1 tablet (10 mg total) by mouth daily. Please keep upcoming appt for more refills 30 tablet 0  . clopidogrel (PLAVIX) 75 MG tablet Take 1 tablet (75 mg total) by mouth daily. Please keep upcoming appt for more refills 30 tablet 0  . ezetimibe (ZETIA) 10 MG tablet TAKE 1 TABLET BY MOUTH EVERY OTHER DAY 45 tablet 0  . fluticasone (FLONASE) 50 MCG/ACT nasal spray Place 2 sprays into both nostrils daily. 16 g 6  . metoprolol succinate (TOPROL-XL) 25 MG 24 hr tablet Take 1 tablet (25 mg total) by mouth daily. Please make appt for future refills 1st attempt 30 tablet 0  . Multiple Vitamin (MULTIVITAMIN WITH MINERALS) TABS Take 1 tablet by mouth daily. Centrum silver    . nitroGLYCERIN (NITROSTAT) 0.4 MG SL tablet PLACE 1 TABLET UNDER THE TONGUE EVER Y 5 MINUTES AS NEEDED FOR CHEST PAIN (3  DOSES MAXIMUM) 25 tablet 0  . ranitidine (ZANTAC) 150 MG capsule Take 150 mg by mouth 2 (two) times daily as needed for heartburn.    . rosuvastatin (CRESTOR) 10 MG tablet Take 1 tablet by mouth every other day. 45 tablet 3  . tamsulosin (FLOMAX) 0.4 MG CAPS capsule Take 0.4 mg by mouth daily.    Marland Kitchen PEG-KCl-NaCl-NaSulf-Na Asc-C (PLENVU) 140 g SOLR Take 140 g by mouth as directed. 1 each  0   No current facility-administered medications for this visit.       ___________________________________________________________________ Objective   Exam:  BP 132/80   Pulse 91   Ht 6\' 2"  (1.88 m)   Wt 206 lb (93.4 kg)   SpO2 98%   BMI 26.45 kg/m    General: this is a(n) well-appearing man  Eyes: sclera anicteric, no redness  ENT: oral mucosa moist without lesions, no cervical or supraclavicular lymphadenopathy  CV: RRR without murmur, S1/S2, no JVD, no peripheral edema  Resp: clear to auscultation bilaterally, normal RR and effort noted  GI: soft, no tenderness, with active bowel sounds. No guarding or palpable organomegaly noted.  Skin; warm and dry, no rash or jaundice noted  Neuro: awake, alert and oriented x 3. Normal gross motor function and fluent speech   Assessment: Encounter Diagnoses  Name Primary?  . Special screening for malignant neoplasms, colon Yes  . Coronary artery disease involving native coronary artery of native heart without angina pectoris     He is appropriate for screening colonoscopy.  [place] will need to be off Plavix 5 days prior to procedure, and we will clear this with his cardiologist.  I suspect they will not have an objection, given the number of years since his last coronary intervention.  We discussed the nature of the procedure including the risks and benefits and he is agreeable.  There is the small but real risk of cardiac event while he is off Plavix several days.   Nelida Meuse III  CC: [name], MD

## 2017-07-17 NOTE — Patient Instructions (Addendum)
If you are age 68 or older, your body mass index should be between 23-30. Your Body mass index is 26.45 kg/m. If this is out of the aforementioned range listed, please consider follow up with your Primary Care Provider.  If you are age 46 or younger, your body mass index should be between 19-25. Your Body mass index is 26.45 kg/m. If this is out of the aformentioned range listed, please consider follow up with your Primary Care Provider.   You have been scheduled for a colonoscopy. Please follow written instructions given to you at your visit today.  Please pick up your prep supplies at the pharmacy within the next 1-3 days. If you use inhalers (even only as needed), please bring them with you on the day of your procedure. Your physician has requested that you go to www.startemmi.com and enter the access code given to you at your visit today. This web site gives a general overview about your procedure. However, you should still follow specific instructions given to you by our office regarding your preparation for the procedure.  It was a pleasure to meet you today!  Dr. Loletha Carrow

## 2017-07-17 NOTE — Telephone Encounter (Signed)
[facility] Pre-operative Risk Assessment     Request for surgical clearance:     Endoscopy Procedure  What type of surgery is being performed?     Colonoscopy   When is this surgery scheduled?     08-07-2017  What type of clearance is required ?   Pharmacy  Are there any medications that need to be held prior to surgery and how long? Plavix, 5 days prior   Practice name and name of physician performing surgery?      [facility]  What is your office phone and fax number?      Phone- [phone]  [phone]  Anesthesia type (None, local, MAC, general) ?       MAC

## 2017-07-18 NOTE — Telephone Encounter (Signed)
Left message to return call 

## 2017-07-18 NOTE — Telephone Encounter (Signed)
   Primary Cardiologist: Dr. Johnsie Cancel  Chart reviewed as part of pre-operative protocol coverage. Given past medical history and time since last visit, based on ACC/AHA guidelines, Eric Dickerson would be at acceptable risk for the planned procedure without further cardiovascular testing. He was just seen by provider in our practice 07/16/17 and doing well w/o anginal symptoms.   He can hold Plavix for 5 days prior to colonoscopy. Resume as soon as safe to do so from GI standpoint.   I will route this recommendation to the requesting party via Epic fax function and remove from pre-op pool.  Please call with questions.  Lyda Jester, PA-C 07/18/2017, 3:02 PM

## 2017-07-19 NOTE — Telephone Encounter (Signed)
Pt. notified and aware. He states understanding to hold the Plavix 5 days prior to colonoscopy.

## 2017-07-19 NOTE — Telephone Encounter (Signed)
Pre-op clearance to hold plavix for 5 days prior to procedure has been faxed via epic.

## 2017-07-19 NOTE — Telephone Encounter (Signed)
Patient returned phone call. °

## 2017-07-23 ENCOUNTER — Other Ambulatory Visit: Payer: Self-pay | Admitting: Cardiovascular Disease

## 2017-07-24 ENCOUNTER — Encounter: Payer: Self-pay | Admitting: Gastroenterology

## 2017-08-06 HISTORY — PX: COLONOSCOPY: SHX174

## 2017-08-07 ENCOUNTER — Other Ambulatory Visit: Payer: Self-pay

## 2017-08-07 ENCOUNTER — Ambulatory Visit (AMBULATORY_SURGERY_CENTER): Payer: BC Managed Care – PPO | Admitting: Gastroenterology

## 2017-08-07 ENCOUNTER — Encounter: Payer: Self-pay | Admitting: Gastroenterology

## 2017-08-07 VITALS — BP 124/76 | HR 64 | Temp 99.3°F | Resp 19 | Ht 74.0 in | Wt 206.0 lb

## 2017-08-07 DIAGNOSIS — Z1211 Encounter for screening for malignant neoplasm of colon: Secondary | ICD-10-CM | POA: Diagnosis not present

## 2017-08-07 DIAGNOSIS — D122 Benign neoplasm of ascending colon: Secondary | ICD-10-CM | POA: Diagnosis not present

## 2017-08-07 DIAGNOSIS — K635 Polyp of colon: Secondary | ICD-10-CM

## 2017-08-07 DIAGNOSIS — D120 Benign neoplasm of cecum: Secondary | ICD-10-CM

## 2017-08-07 MED ORDER — SODIUM CHLORIDE 0.9 % IV SOLN: 500.0000 mL | INTRAVENOUS | Status: DC

## 2017-08-07 NOTE — Patient Instructions (Signed)
YOU HAD AN ENDOSCOPIC PROCEDURE TODAY AT [facility]:   Refer to the procedure report that was given to you for any specific questions about what was found during the examination.  If the procedure report does not answer your questions, please call your gastroenterologist to clarify.  If you requested that your care partner not be given the details of your procedure findings, then the procedure report has been included in a sealed envelope for you to review at your convenience later.  YOU SHOULD EXPECT: Some feelings of bloating in the abdomen. Passage of more gas than usual.  Walking can help get rid of the air that was put into your GI tract during the procedure and reduce the bloating. If you had a lower endoscopy (such as a colonoscopy or flexible sigmoidoscopy) you may notice spotting of blood in your stool or on the toilet paper. If you underwent a bowel prep for your procedure, you may not have a normal bowel movement for a few days.  Please Note:  You might notice some irritation and congestion in your nose or some drainage.  This is from the oxygen used during your procedure.  There is no need for concern and it should clear up in a day or so.  SYMPTOMS TO REPORT IMMEDIATELY:   Following lower endoscopy (colonoscopy or flexible sigmoidoscopy):  Excessive amounts of blood in the stool  Significant tenderness or worsening of abdominal pains  Swelling of the abdomen that is new, acute  Fever of 100F or higher  For urgent or emergent issues, a gastroenterologist can be reached at any hour by calling [phone].   DIET:  We do recommend a small meal at first, but then you may proceed to your regular diet.  Drink plenty of fluids but you should avoid alcoholic beverages for 24 hours.  ACTIVITY:  You should plan to take it easy for the rest of today and you should NOT DRIVE or use heavy machinery until tomorrow (because of the sedation medicines used during the test).     FOLLOW UP: Our staff will call the number listed on your records the next business day following your procedure to check on you and address any questions or concerns that you may have regarding the information given to you following your procedure. If we do not reach you, we will leave a message.  However, if you are feeling well and you are not experiencing any problems, there is no need to return our call.  We will assume that you have returned to your regular daily activities without incident.  If any biopsies were taken you will be contacted by phone or by letter within the next 1-3 weeks.  Please call us at [phone] if you have not heard about the biopsies in 3 weeks.   Await for biopsy results Polyps (handout given) Hemorrhoids (handout given) Diverticulosis (handout given) Restart Plavix tomorrow  SIGNATURES/CONFIDENTIALITY: You and/or your care partner have signed paperwork which will be entered into your electronic medical record.  These signatures attest to the fact that that the information above on your After Visit Summary has been reviewed and is understood.  Full responsibility of the confidentiality of this discharge information lies with you and/or your care-partner.

## 2017-08-07 NOTE — Op Note (Signed)
[facility] Patient Name: Eric Dickerson Procedure Date: 08/07/2017 1:29 PM MRN: [id number] Endoscopist: Mallie Mussel L. Loletha Carrow , MD Age: 67 Referring MD:  Date of Birth: 1950-09-26 Gender: Male Account #: [id number] Procedure:                Colonoscopy Indications:              Screening for colorectal malignant neoplasm ( no                            polyps 05/2007) Medicines:                Monitored Anesthesia Care Procedure:                Pre-Anesthesia Assessment:                           - Prior to the procedure, a History and Physical                            was performed, and patient medications and                            allergies were reviewed. The patient's tolerance of                            previous anesthesia was also reviewed. The risks                            and benefits of the procedure and the sedation                            options and risks were discussed with the patient.                            All questions were answered, and informed consent                            was obtained. Prior Anticoagulants: The patient has                            taken Plavix (clopidogrel), last dose was 5 days                            prior to procedure. ASA Grade Assessment: III - A                            patient with severe systemic disease. After                            reviewing the risks and benefits, the patient was                            deemed in satisfactory condition to undergo the  procedure.                           After obtaining informed consent, the colonoscope                            was passed under direct vision. Throughout the                            procedure, the patient's blood pressure, pulse, and                            oxygen saturations were monitored continuously. The                            Model CF-HQ190L [id number]) scope was introduced                            through  the anus and advanced to the the cecum,                            identified by appendiceal orifice and ileocecal                            valve. The colonoscopy was performed without                            difficulty. The patient tolerated the procedure                            well. The quality of the bowel preparation was                            good. The ileocecal valve, appendiceal orifice, and                            rectum were photographed. The quality of the bowel                            preparation was evaluated using the BBPS [facility]                            Bowel Preparation Scale) with scores of: Right                            Colon = 2, Transverse Colon = 2 and Left Colon = 2.                            The total BBPS score equals 6. Scope In: 1:44:06 PM Scope Out: 1:56:14 PM Scope Withdrawal Time: 0 hours 9 minutes 20 seconds  Total Procedure Duration: 0 hours 12 minutes 8 seconds  Findings:                 The perianal and digital rectal examinations were  normal.                           Many large-mouthed diverticula were found in the                            left colon and right colon.                           A 2 mm polyp was found in the cecum. The polyp was                            sessile. The polyp was removed with a cold biopsy                            forceps. Resection and retrieval were complete.                           A 4 mm polyp was found in the ascending colon. The                            polyp was sessile. The polyp was removed with a                            cold snare. Resection and retrieval were complete.                           Internal hemorrhoids were found. The hemorrhoids                            were Grade I (internal hemorrhoids that do not                            prolapse).                           The exam was otherwise without abnormality on                             direct and retroflexion views. Complications:            No immediate complications. Estimated Blood Loss:     Estimated blood loss was minimal. Impression:               - Diverticulosis in the left colon and in the right                            colon.                           - One 2 mm polyp in the cecum, removed with a cold                            biopsy forceps. Resected and retrieved.                           -  One 4 mm polyp in the ascending colon, removed                            with a cold snare. Resected and retrieved.                           - Internal hemorrhoids.                           - The examination was otherwise normal on direct                            and retroflexion views. Recommendation:           - Patient has a contact number available for                            emergencies. The signs and symptoms of potential                            delayed complications were discussed with the                            patient. Return to normal activities tomorrow.                            Written discharge instructions were provided to the                            patient.                           - Resume previous diet.                           - Continue present medications.                           - Resume Plavix (clopidogrel) at prior dose                            tomorrow.                           - Await pathology results.                           - Repeat colonoscopy is recommended for                            surveillance. The colonoscopy date will be                            determined after pathology results from today's                            exam become available for review. Laiah Pouncey L.  Loletha Carrow, MD 08/07/2017 2:01:38 PM This report has been signed electronically.

## 2017-08-07 NOTE — Progress Notes (Signed)
Called to room to assist during endoscopic procedure.  Patient ID and intended procedure confirmed with present staff. Received instructions for my participation in the procedure from the performing physician.  

## 2017-08-07 NOTE — Progress Notes (Signed)
To PACU, VSS. Report to RN.tb 

## 2017-08-08 ENCOUNTER — Telehealth: Payer: Self-pay | Admitting: *Deleted

## 2017-08-08 NOTE — Telephone Encounter (Signed)
  Follow up Call-  Call back number 08/07/2017  Post procedure Call Back phone  # [phone]  Permission to leave phone message Yes  Some recent data might be hidden     Patient questions:  Do you have a fever, pain , or abdominal swelling? No. Pain Score  0 *  Have you tolerated food without any problems? Yes.    Have you been able to return to your normal activities? Yes.    Do you have any questions about your discharge instructions: Diet   No. Medications  No. Follow up visit  No.  Do you have questions or concerns about your Care? No.  Actions: * If pain score is 4 or above: No action needed, pain <4.

## 2017-08-08 NOTE — Telephone Encounter (Signed)
No answer, left message to call if questions or concerns. 

## 2017-08-13 ENCOUNTER — Encounter: Payer: Self-pay | Admitting: Gastroenterology

## 2017-11-27 ENCOUNTER — Ambulatory Visit: Payer: BC Managed Care – PPO | Admitting: Family

## 2017-11-27 ENCOUNTER — Other Ambulatory Visit: Payer: Self-pay | Admitting: Family

## 2017-11-27 ENCOUNTER — Encounter: Payer: Self-pay | Admitting: Family

## 2017-11-27 ENCOUNTER — Other Ambulatory Visit (INDEPENDENT_AMBULATORY_CARE_PROVIDER_SITE_OTHER): Payer: BC Managed Care – PPO

## 2017-11-27 VITALS — BP 136/80 | HR 73 | Temp 98.3°F | Ht 74.0 in | Wt 203.1 lb

## 2017-11-27 DIAGNOSIS — N401 Enlarged prostate with lower urinary tract symptoms: Secondary | ICD-10-CM | POA: Diagnosis not present

## 2017-11-27 DIAGNOSIS — R350 Frequency of micturition: Secondary | ICD-10-CM | POA: Diagnosis not present

## 2017-11-27 DIAGNOSIS — H6123 Impacted cerumen, bilateral: Secondary | ICD-10-CM

## 2017-11-27 DIAGNOSIS — R351 Nocturia: Secondary | ICD-10-CM

## 2017-11-27 DIAGNOSIS — R972 Elevated prostate specific antigen [PSA]: Secondary | ICD-10-CM

## 2017-11-27 LAB — PSA: PSA: 5.98 ng/mL — ABNORMAL HIGH (ref 0.10–4.00)

## 2017-11-27 LAB — CBC WITH DIFFERENTIAL/PLATELET
BASOS ABS: 0.1 10*3/uL (ref 0.0–0.1)
Basophils Relative: 1.3 % (ref 0.0–3.0)
Eosinophils Absolute: 0.1 10*3/uL (ref 0.0–0.7)
Eosinophils Relative: 1.6 % (ref 0.0–5.0)
HCT: 39.7 % (ref 39.0–52.0)
HEMOGLOBIN: 13 g/dL (ref 13.0–17.0)
LYMPHS ABS: 1.8 10*3/uL (ref 0.7–4.0)
Lymphocytes Relative: 27.2 % (ref 12.0–46.0)
MCHC: 32.7 g/dL (ref 30.0–36.0)
MCV: 85.4 fl (ref 78.0–100.0)
MONOS PCT: 11.1 % (ref 3.0–12.0)
Monocytes Absolute: 0.7 10*3/uL (ref 0.1–1.0)
Neutro Abs: 3.8 10*3/uL (ref 1.4–7.7)
Neutrophils Relative %: 58.8 % (ref 43.0–77.0)
Platelets: 255 10*3/uL (ref 150.0–400.0)
RBC: 4.65 Mil/uL (ref 4.22–5.81)
RDW: 14.3 % (ref 11.5–15.5)
WBC: 6.5 10*3/uL (ref 4.0–10.5)

## 2017-11-27 LAB — COMPREHENSIVE METABOLIC PANEL
ALBUMIN: 4.1 g/dL (ref 3.5–5.2)
ALK PHOS: 80 U/L (ref 39–117)
ALT: 17 U/L (ref 0–53)
AST: 20 U/L (ref 0–37)
BILIRUBIN TOTAL: 0.3 mg/dL (ref 0.2–1.2)
BUN: 19 mg/dL (ref 6–23)
CO2: 30 mEq/L (ref 19–32)
Calcium: 9.7 mg/dL (ref 8.4–10.5)
Chloride: 105 mEq/L (ref 96–112)
Creatinine, Ser: 1.08 mg/dL (ref 0.40–1.50)
GFR: 87.37 mL/min (ref >60.00)
GLUCOSE: 80 mg/dL (ref 70–99)
Potassium: 4.1 mEq/L (ref 3.5–5.1)
Sodium: 140 mEq/L (ref 135–145)
TOTAL PROTEIN: 7.3 g/dL (ref 6.0–8.3)

## 2017-11-27 LAB — POC URINALSYSI DIPSTICK (AUTOMATED)
BILIRUBIN UA: NEGATIVE
Glucose, UA: NEGATIVE
KETONES UA: NEGATIVE
Leukocytes, UA: NEGATIVE
Nitrite, UA: NEGATIVE
PH UA: 6 (ref 5.0–8.0)
Protein, UA: NEGATIVE
RBC UA: NEGATIVE
SPEC GRAV UA: 1.025 (ref 1.010–1.025)
Urobilinogen, UA: 0.2 E.U./dL

## 2017-11-27 LAB — HEMOGLOBIN A1C: HEMOGLOBIN A1C: 6 % (ref 4.6–6.5)

## 2017-11-27 MED ORDER — DOXYCYCLINE HYCLATE 100 MG PO TABS: 100.0000 mg | ORAL_TABLET | Freq: Two times a day (BID) | ORAL | 0 refills | Status: DC

## 2017-11-27 MED ORDER — FLUTICASONE PROPIONATE 50 MCG/ACT NA SUSP: 2.0000 | Freq: Every day | NASAL | 6 refills | Status: DC

## 2017-11-27 NOTE — Progress Notes (Signed)
Eric Dickerson is a 67 y.o. male with the following history as recorded in Epic[employer]:  Patient Active Problem List   Diagnosis Date Noted  . Elevated PSA 03/01/2017  . CAD (coronary artery disease)   . HTN (hypertension)   . Hyperlipidemia   . Impacted cerumen of both ears 11/24/2015  . Routine general medical examination at a health care facility 03/29/2015  . Allergic rhinitis 03/23/2015  . Solar urticaria 07/30/2014  . Primary localized osteoarthrosis, lower leg 07/15/2013  . Acute medial meniscal tear 07/15/2013  . Other abnormal glucose 09/06/2012  . Urgency of urination 09/06/2012  . PAD (peripheral artery disease) ([place]) 06/28/2011  . ANEMIA, MILD 02/02/2010  . Urinary frequency 02/02/2010  . SPINAL STENOSIS, LUMBAR 08/05/2008  . LUMBAR RADICULOPATHY, RIGHT 07/29/2008  . HIATAL HERNIA 08/19/2007  . DIVERTICULOSIS, COLON 08/19/2007  . GERD 05/06/2007  . ERECTILE DYSFUNCTION 01/22/2007  . HYPERPLASIA PROSTATE UNS W/O UR OBST & OTH LUTS 01/22/2007  . DEGENERATIVE JOINT DISEASE, RIGHT HIP 01/22/2007    Current Outpatient Medications  Medication Sig Dispense Refill  . amLODipine (NORVASC) 10 MG tablet TAKE 1 TABLET BY MOUTH EVERY DAY **NEED APPOINTMENT FOR FUTURE REFILLS** 90 tablet 3  . clopidogrel (PLAVIX) 75 MG tablet Take 1 tablet (75 mg total) by mouth daily. Please keep upcoming appt for more refills 30 tablet 0  . ezetimibe (ZETIA) 10 MG tablet TAKE 1 TABLET BY MOUTH EVERY OTHER DAY 45 tablet 0  . fluticasone (FLONASE) 50 MCG/ACT nasal spray Place 2 sprays into both nostrils daily. 16 g 6  . metoprolol succinate (TOPROL-XL) 25 MG 24 hr tablet TAKE 1 TABLET BY MOUTH EVERY DAY **NEED APPOINTMENT FOR FUTURE REFILLS** 90 tablet 3  . Multiple Vitamin (MULTIVITAMIN WITH MINERALS) TABS Take 1 tablet by mouth daily. Centrum silver    . nitroGLYCERIN (NITROSTAT) 0.4 MG SL tablet PLACE 1 TABLET UNDER THE TONGUE EVER Y 5 MINUTES AS NEEDED FOR CHEST PAIN (3 DOSES MAXIMUM) 25 tablet 0   . ranitidine (ZANTAC) 150 MG capsule Take 150 mg by mouth 2 (two) times daily as needed for heartburn.    . rosuvastatin (CRESTOR) 10 MG tablet Take 1 tablet by mouth every other day. 45 tablet 3  . doxycycline (VIBRA-TABS) 100 MG tablet Take 1 tablet (100 mg total) by mouth 2 (two) times daily. 20 tablet 0  . tamsulosin (FLOMAX) 0.4 MG CAPS capsule Take 0.4 mg by mouth daily.     Current Facility-Administered Medications  Medication Dose Route Frequency Provider Last Rate Last Dose  . 0.9 %  sodium chloride infusion  500 mL Intravenous Continuous Danis, Estill Cotta III, MD        Allergies: Olmesartan medoxomil; Quinapril hcl; Glucosamine; Livalo [pitavastatin]; Rosuvastatin; and Verapamil  Past Medical History:  Diagnosis Date  . ANEMIA, MILD   . CAD (coronary artery disease)    a. CAD,  b. s/p Cypher DES to the ramus and Promus DES x 2 to the RCA 08/2007 (minimal disease in LAD and CFX at that time), c. Myoview 8/10: EF 63%, inf thinning, small prior ant infarct, no ischemia;  d. s/p CABG 3/14 (L-LAD, RIMA-OM1)  . DEGENERATIVE JOINT DISEASE, RIGHT HIP   . DIVERTICULOSIS, COLON   . ERECTILE DYSFUNCTION   . GERD   . Heart murmur   . HIATAL HERNIA   . HTN (hypertension)   . Hyperlipidemia    statin intolerant  . HYPERPLASIA PROSTATE UNS W/O UR OBST & OTH LUTS   . LUMBAR RADICULOPATHY,  RIGHT   . Other abnormal glucose   . PAD (peripheral artery disease) ([place])    pre CABG 04/2012 => ABIs: Right 0.92, left 0.61 // ABI 09/02/14: R 0.93; L 0.82  . SPINAL STENOSIS, LUMBAR     Past Surgical History:  Procedure Laterality Date  . COLONOSCOPY  2002   negative  . CORONARY ARTERY BYPASS GRAFT N/A 04/22/2012   Procedure: CORONARY ARTERY BYPASS GRAFTING (CABG);  Surgeon: [name], MD;  Location: [facility];  Service: Open Heart Surgery;  Laterality: N/A;  CABG X 2, BIMA, POSSIBLE EVH  . CORONARY STENT PLACEMENT  2010    X 2  . TEE WITHOUT CARDIOVERSION N/A 04/22/2012   Procedure:  TRANSESOPHAGEAL ECHOCARDIOGRAM (TEE);  Surgeon: [name], MD;  Location: [facility];  Service: Open Heart Surgery;  Laterality: N/A;  . TOTAL HIP ARTHROPLASTY  2009    Family History  Problem Relation Age of Onset  . Hypertension Father   . Heart attack Father 30       smoker  . Hyperlipidemia Mother   . Diabetes Maternal Grandmother   . Stroke Brother        2 brothers ; 67 & 52  . Hyperlipidemia Brother   . Pulmonary embolism Brother   . Heart attack Brother 85  . Cancer Neg Hx   . COPD Neg Hx   . Colon cancer Neg Hx   . Esophageal cancer Neg Hx   . Stomach cancer Neg Hx   . Rectal cancer Neg Hx     Social History   Tobacco Use  . Smoking status: Former Smoker    Packs/day: 1.50    Years: 12.00    Pack years: 18.00    Last attempt to quit: 02/06/1985    Years since quitting: 32.8  . Smokeless tobacco: Never Used  . Tobacco comment: smoked 1967-1987, up to 1.5 ppd  Substance Use Topics  . Alcohol use: No    Subjective:  Patient presents with concerns for frequent urination "on and off" for the past 7 days; has been on Flomax x 1 year with no difficulty; had an episode of feeling light-headed last week and was concerned that related to Flomax; stopped medication/ has noticed worsening urinary symptoms; denies any blood in urine, back or abdominal pain; feels like he is not emptying his bladder completely; no fever;      Objective:  Vitals:   11/27/17 1403  BP: 136/80  Pulse: 73  Temp: 98.3 F (36.8 C)  TempSrc: Oral  SpO2: 98%  Weight: 203 lb 1.9 oz (92.1 kg)  Height: '6\' 2"'$  (1.88 m)    General: Well developed, well nourished, in no acute distress  Skin : Warm and dry.  Head: Normocephalic and atraumatic  Eyes: Sclera and conjunctiva clear; pupils round and reactive to light; extraocular movements intact  Ears: External normal; canals clear; after ear lavage, tympanic membranes normal  Oropharynx: Pink, supple. No suspicious lesions  Neck: Supple  without thyromegaly, adenopathy  Lungs: Respirations unlabored; clear to auscultation bilaterally without wheeze, rales, rhonchi  CVS exam: normal rate and regular rhythm.  Abdomen: Soft; nontender; nondistended; normoactive bowel sounds; no masses or hepatosplenomegaly  Neurologic: Alert and oriented; speech intact; face symmetrical; moves all extremities well; CNII-XII intact without focal deficit   Assessment:  1. BPH associated with nocturia   2. Frequent urination   3. Bilateral impacted cerumen     Plan:  1. ? Underlying prostatitis; re-start Flomax; Rx for Doxycycline  100 mg bid x 10 days; check PSA today; follow-up to be determined. 2. Check CBC, CMP, Hgba1c today; 3. Reassurance that ears mostly like source of feeling light-headed/ not his Flomax; ear lavage completed successfully.    No follow-ups on file.  Orders Placed This Encounter  Procedures  . CBC w/Diff    Standing Status:   Future    Number of Occurrences:   1    Standing Expiration Date:   11/27/2018  . Comp Met (CMET)    Standing Status:   Future    Number of Occurrences:   1    Standing Expiration Date:   11/27/2018  . HgB A1c    Standing Status:   Future    Number of Occurrences:   1    Standing Expiration Date:   11/27/2018  . PSA    Standing Status:   Future    Number of Occurrences:   1    Standing Expiration Date:   11/27/2018  . POCT Urinalysis Dipstick (Automated)    Requested Prescriptions   Signed Prescriptions Disp Refills  . fluticasone (FLONASE) 50 MCG/ACT nasal spray 16 g 6    Sig: Place 2 sprays into both nostrils daily.  Marland Kitchen doxycycline (VIBRA-TABS) 100 MG tablet 20 tablet 0    Sig: Take 1 tablet (100 mg total) by mouth 2 (two) times daily.

## 2017-12-12 DIAGNOSIS — M25551 Pain in right hip: Secondary | ICD-10-CM | POA: Insufficient documentation

## 2018-03-20 ENCOUNTER — Encounter: Payer: Self-pay | Admitting: Family

## 2018-08-16 ENCOUNTER — Other Ambulatory Visit (HOSPITAL_COMMUNITY): Payer: Self-pay

## 2018-08-27 ENCOUNTER — Other Ambulatory Visit: Payer: Self-pay | Admitting: Cardiovascular Disease

## 2018-08-27 MED ORDER — METOPROLOL SUCCINATE ER 25 MG PO TB24: ORAL_TABLET | ORAL | 0 refills | Status: DC

## 2018-08-27 MED ORDER — AMLODIPINE BESYLATE 10 MG PO TABS: ORAL_TABLET | ORAL | 0 refills | Status: DC

## 2018-08-27 NOTE — Telephone Encounter (Signed)
Pt's medications were sent to pt's pharmacy as requested. Confirmation received.  

## 2018-09-17 ENCOUNTER — Encounter: Payer: Self-pay | Admitting: Cardiology

## 2018-09-17 ENCOUNTER — Ambulatory Visit: Payer: BC Managed Care – PPO | Admitting: Cardiology

## 2018-09-17 ENCOUNTER — Other Ambulatory Visit: Payer: Self-pay

## 2018-09-17 VITALS — BP 126/88 | HR 73 | Ht 74.0 in | Wt 200.8 lb

## 2018-09-17 DIAGNOSIS — E782 Mixed hyperlipidemia: Secondary | ICD-10-CM | POA: Diagnosis not present

## 2018-09-17 DIAGNOSIS — T887XXA Unspecified adverse effect of drug or medicament, initial encounter: Secondary | ICD-10-CM

## 2018-09-17 DIAGNOSIS — I739 Peripheral vascular disease, unspecified: Secondary | ICD-10-CM

## 2018-09-17 DIAGNOSIS — I10 Essential (primary) hypertension: Secondary | ICD-10-CM | POA: Diagnosis not present

## 2018-09-17 DIAGNOSIS — I2510 Atherosclerotic heart disease of native coronary artery without angina pectoris: Secondary | ICD-10-CM | POA: Diagnosis not present

## 2018-09-17 NOTE — Patient Instructions (Signed)
Medication Instructions:  Your physician has recommended you make the following change in your medication:  1.  STOP the Metoprolol   If you need a refill on your cardiac medications before your next appointment, please call your pharmacy.   Lab work: 10/10/2018 (at next office visit) [place]  If you have labs (blood work) drawn today and your tests are completely normal, you will receive your results only by: Marland Kitchen MyChart Message (if you have MyChart) OR . A paper copy in the mail If you have any lab test that is abnormal or we need to change your treatment, we will call you to review the results.  Testing/Procedures: None ordered  Follow-Up: At [facility], you and your health needs are our priority.  As part of our continuing mission to provide you with exceptional heart care, we have created designated Provider Care Teams.  These Care Teams include your primary Cardiologist (physician) and Advanced Practice Providers (APPs -  Physician Assistants and Nurse Practitioners) who all work together to provide you with the care you need, when you need it. Marland Kitchen You are scheduled to follow-up with Cecilie Kicks, NP, 10/10/2018 at 9:45.  MAKE SURE YOU COME FASTING FOR YOUR LAB WORK  Any Other Special Instructions Will Be Listed Below (If Applicable). None ordered

## 2018-09-17 NOTE — Progress Notes (Signed)
Cardiology Office Note   Date:  09/17/2018   ID:  Eric Dickerson, DOB Sep 21, 1950, MRN [id number]  PCP:  Eric Borg, MD  Cardiologist:  Dr. Johnsie Cancel    PV Dr. Fletcher Dickerson   Chief Complaint  Patient presents with  . Coronary Artery Disease      History of Present Illness: Eric Dickerson is a 68 y.o. male who presents for CAD.   He has ahx of CAD, s/p Cypher DES to the ramus and Promus DES x 2 to the RCA 08/2007 (minimal disease in LAD and CFX at that time), PAD, HL, HTN. He developed progressively worsening chest pain in 3/14 and LHC demonstrated 3 v CAD. He was referred for CABG by Dr. Roxan Dickerson 04/22/12 (LIMA-LAD, free RIMA-OM1).   Cannot take asa due to GERD, needs plavix,     ? PSK9 candidate. Currently tolerating the crestor and zetia with LDL of 74- alternates them every other day. He has only been taking half of his plavix, pt taking this way he is afraid of internal bleeding.   Last visit 07/16/17 pt was stable.     Today no chest pain and no SOB.  He does complain of lower back pain on the metoprolol.  He had run out and his back pain improved so when he had new supply he took every other day and felt better on non BB days.  He felt he urinated better when not taking.  He is still working at [employer] and practices social distancing, wears mask and has face shield and washes hands freq.  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      Past Medical History:  Diagnosis Date  . ANEMIA, MILD   . CAD (coronary artery disease)    a. CAD,  b. s/p Cypher DES to the ramus and Promus DES x 2 to the RCA 08/2007 (minimal disease in LAD and CFX at that time), c. Myoview 8/10: EF 63%, inf thinning, small prior ant infarct, no ischemia;  d. s/p CABG 3/14 (L-LAD, RIMA-OM1)  . DEGENERATIVE JOINT DISEASE, RIGHT HIP   . DIVERTICULOSIS, COLON   . ERECTILE DYSFUNCTION   . GERD   . Heart murmur   . HIATAL HERNIA   . HTN (hypertension)   . Hyperlipidemia    statin intolerant  . HYPERPLASIA  PROSTATE UNS W/O UR OBST & OTH LUTS   . LUMBAR RADICULOPATHY, RIGHT   . Other abnormal glucose   . PAD (peripheral artery disease) ([place])    pre CABG 04/2012 => ABIs: Right 0.92, left 0.61 // ABI 09/02/14: R 0.93; L 0.82  . SPINAL STENOSIS, LUMBAR     Past Surgical History:  Procedure Laterality Date  . COLONOSCOPY  2002   negative  . CORONARY ARTERY BYPASS GRAFT N/A 04/22/2012   Procedure: CORONARY ARTERY BYPASS GRAFTING (CABG);  Surgeon: Eric Nakayama, MD;  Location: [facility];  Service: Open Heart Surgery;  Laterality: N/A;  CABG X 2, BIMA, POSSIBLE EVH  . CORONARY STENT PLACEMENT  2010    X 2  . TEE WITHOUT CARDIOVERSION N/A 04/22/2012   Procedure: TRANSESOPHAGEAL ECHOCARDIOGRAM (TEE);  Surgeon: Eric Nakayama, MD;  Location: [facility];  Service: Open Heart Surgery;  Laterality: N/A;  . TOTAL HIP ARTHROPLASTY  2009     Current Outpatient Medications  Medication Sig Dispense Refill  . amLODipine (NORVASC) 10 MG tablet TAKE 1 TABLET BY MOUTH EVERY DAY. Please make overdue appt with Dr. Johnsie Cancel before anymore refills.  1st attempt 30 tablet 0  . clopidogrel (PLAVIX) 75 MG tablet Take 1 tablet (75 mg total) by mouth daily. Please keep upcoming appt for more refills 30 tablet 0  . doxycycline (VIBRA-TABS) 100 MG tablet Take 1 tablet (100 mg total) by mouth 2 (two) times daily. 20 tablet 0  . fluticasone (FLONASE) 50 MCG/ACT nasal spray Place 2 sprays into both nostrils daily. 16 g 6  . metoprolol succinate (TOPROL-XL) 25 MG 24 hr tablet TAKE 1 TABLET BY MOUTH EVERY DAY. Please make overdue appt with Dr. Johnsie Cancel before anymore refills. 1st attempt 30 tablet 0  . Multiple Vitamin (MULTIVITAMIN WITH MINERALS) TABS Take 1 tablet by mouth daily. Centrum silver    . nitroGLYCERIN (NITROSTAT) 0.4 MG SL tablet PLACE 1 TABLET UNDER THE TONGUE EVER Y 5 MINUTES AS NEEDED FOR CHEST PAIN (3 DOSES MAXIMUM) 25 tablet 0  . tamsulosin (FLOMAX) 0.4 MG CAPS capsule Take 0.4 mg by mouth daily.     Current  Facility-Administered Medications  Medication Dose Route Frequency Provider Last Rate Last Dose  . 0.9 %  sodium chloride infusion  500 mL Intravenous Continuous Eric, Estill Cotta III, MD        Allergies:   Olmesartan medoxomil, Quinapril hcl, Glucosamine, Livalo [pitavastatin], Rosuvastatin, and Verapamil    Social History:  The patient  reports that he quit smoking about 33 years ago. He has a 18.00 pack-year smoking history. He has never used smokeless tobacco. He reports that he does not drink alcohol or use drugs.   Family History:  The patient's family history includes Diabetes in his maternal grandmother; Heart attack (age of onset: 56) in his father; Heart attack (age of onset: 37) in his brother; Hyperlipidemia in his brother and mother; Hypertension in his father; Pulmonary embolism in his brother; Stroke in his brother.    ROS:  General:no colds or fevers, + weight loss Skin:no rashes or ulcers HEENT:no blurred vision, no congestion CV:see HPI PUL:see HPI GI:no diarrhea constipation or melena, no indigestion GU:no hematuria, no dysuria MS:no joint pain, no claudication Neuro:no syncope, no lightheadedness Endo:no diabetes, no thyroid disease  Wt Readings from Last 3 Encounters:  09/17/18 200 lb 12.8 oz (91.1 kg)  11/27/17 203 lb 1.9 oz (92.1 kg)  08/07/17 206 lb (93.4 kg)     PHYSICAL EXAM: VS:  BP 126/88   Pulse 73   Ht 6\' 2"  (1.88 m)   Wt 200 lb 12.8 oz (91.1 kg)   SpO2 98%   BMI 25.78 kg/m  , BMI Body mass index is 25.78 kg/m. General:Pleasant affect, NAD Skin:Warm and dry, brisk capillary refill HEENT:normocephalic, sclera clear, mucus membranes moist Neck:supple, no JVD, no bruits  Heart:S1S2 RRR without murmur, gallup, rub or click Lungs:clear without rales, rhonchi, or wheezes BTY:OMAY, non tender, + BS, do not palpate liver spleen or masses Ext:no lower ext edema, 2+ pedal pulses, 2+ radial pulses Neuro:alert and oriented X 3, MAE, follows commands, +  facial symmetry    EKG:  EKG is ordered today. The ekg ordered today demonstrates SR at 73 with RBBB and no changes from prior EKG   Recent Labs: 11/27/2017: ALT 17; BUN 19; Creatinine, Ser 1.08; Hemoglobin 13.0; Platelets 255.0; Potassium 4.1; Sodium 140    Lipid Panel    Component Value Date/Time   CHOL 142 03/01/2017 1438   CHOL 157 10/05/2016 0830   TRIG 52.0 03/01/2017 1438   HDL 58.00 03/01/2017 1438   HDL 60 10/05/2016 0830   CHOLHDL 2  03/01/2017 1438   VLDL 10.4 03/01/2017 1438   LDLCALC 74 03/01/2017 1438   LDLCALC 86 10/05/2016 0830   LDLDIRECT 130.4 11/17/2009 0853       Other studies Reviewed: Additional studies/ records that were reviewed today include: .  Reviewed last OV notes.  ASSESSMENT AND PLAN:  1.  Possible side effect to metoprolol - he will stop for now and return in 3 weeks for BP check - will check with Dr. Johnsie Cancel in meantime to see if we can stop or change to bystolic.  2.  CAD with CABG in 2014 with no chest pain. Stable on plavix because he cannot take aSA due to GERD.    3.  HLD will check in 3 weeks lipids and hepatic and continue on zetia and crestor   4.  PAD without claudication.  5.  HTN controlled off BB, will recheck in 3 weeks.    Current medicines are reviewed with the patient today.  The patient Has no concerns regarding medicines.  The following changes have been made:  See above Labs/ tests ordered today include:see above  Disposition:   FU:  see above  Signed, Cecilie Kicks, NP  09/17/2018 2:37 PM    [address], [place], [place], [place] Phone: [phone]; Fax: [phone]

## 2018-09-24 ENCOUNTER — Other Ambulatory Visit: Payer: Self-pay | Admitting: Cardiovascular Disease

## 2018-10-08 NOTE — Progress Notes (Signed)
Cardiology Office Note   Date:  10/10/2018   ID:  Eric Dickerson, DOB 02/08/1950, MRN [id number]  PCP:  [name], MD  Cardiologist:  Dr. Johnsie Cancel    Chief Complaint  Patient presents with  . Hypertension      History of Present Illness: Eric Dickerson is a 68 y.o. male who presents for BP recheck   He has ahx of CAD, s/p Cypher DES to the ramus and Promus DES x 2 to the RCA 08/2007 (minimal disease in LAD and CFX at that time), PAD, HL, HTN. He developed progressively worsening chest pain in 3/14 and LHC demonstrated 3 v CAD. He was referred for CABG by Dr. Roxan Hockey 04/22/12 (LIMA-LAD, free RIMA-OM1).   Cannot take asa due to GERD, needs plavix,   ? PSK9 candidate.Currently tolerating the crestor and zetia with LDL of 74- alternates them every other day. He has only been taking half of his plavix, pt taking this way he is afraid of internal bleeding. Last visit 07/16/17 pt was stable.     Todayno chest pain and no SOB.  He does complain of lower back pain on the metoprolol.  He had run out and his back pain improved so when he had new supply he took every other day and felt better on non BB days.  He felt he urinated better when not taking.  He is still working at [employer] and practices social distancing, wears mask and has face shield and washes hands freq.  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   We stopped metoprolol and he is back for bP check.  BP not elevated so will hold off BB.  Pt feels better off BB.  Urination is better and back discomfort while still present is better.  Lipids drawn no chest pain or SOB.      Past Medical History:  Diagnosis Date  . ANEMIA, MILD   . CAD (coronary artery disease)    a. CAD,  b. s/p Cypher DES to the ramus and Promus DES x 2 to the RCA 08/2007 (minimal disease in LAD and CFX at that time), c. Myoview 8/10: EF 63%, inf thinning, small prior ant infarct, no ischemia;  d. s/p CABG 3/14 (L-LAD, RIMA-OM1)  . DEGENERATIVE  JOINT DISEASE, RIGHT HIP   . DIVERTICULOSIS, COLON   . ERECTILE DYSFUNCTION   . GERD   . Heart murmur   . HIATAL HERNIA   . HTN (hypertension)   . Hyperlipidemia    statin intolerant  . HYPERPLASIA PROSTATE UNS W/O UR OBST & OTH LUTS   . LUMBAR RADICULOPATHY, RIGHT   . Other abnormal glucose   . PAD (peripheral artery disease) ([place])    pre CABG 04/2012 => ABIs: Right 0.92, left 0.61 // ABI 09/02/14: R 0.93; L 0.82  . SPINAL STENOSIS, LUMBAR     Past Surgical History:  Procedure Laterality Date  . COLONOSCOPY  2002   negative  . CORONARY ARTERY BYPASS GRAFT N/A 04/22/2012   Procedure: CORONARY ARTERY BYPASS GRAFTING (CABG);  Surgeon: [name], MD;  Location: [place];  Service: Open Heart Surgery;  Laterality: N/A;  CABG X 2, BIMA, POSSIBLE EVH  . CORONARY STENT PLACEMENT  2010    X 2  . TEE WITHOUT CARDIOVERSION N/A 04/22/2012   Procedure: TRANSESOPHAGEAL ECHOCARDIOGRAM (TEE);  Surgeon: [name], MD;  Location: [place];  Service: Open Heart Surgery;  Laterality: N/A;  . TOTAL HIP ARTHROPLASTY  2009  Current Outpatient Medications  Medication Sig Dispense Refill  . amLODipine (NORVASC) 10 MG tablet TAKE 1 TABLET BY MOUTH DAILY. MAKE APPT WITH MD 30 tablet 0  . clopidogrel (PLAVIX) 75 MG tablet Take 1 tablet (75 mg total) by mouth daily. Please keep upcoming appt for more refills 30 tablet 0  . fluticasone (FLONASE) 50 MCG/ACT nasal spray Place 2 sprays into both nostrils daily. 16 g 6  . Multiple Vitamin (MULTIVITAMIN WITH MINERALS) TABS Take 1 tablet by mouth daily. Centrum silver    . nitroGLYCERIN (NITROSTAT) 0.4 MG SL tablet PLACE 1 TABLET UNDER THE TONGUE EVER Y 5 MINUTES AS NEEDED FOR CHEST PAIN (3 DOSES MAXIMUM) 25 tablet 0  . tamsulosin (FLOMAX) 0.4 MG CAPS capsule Take 0.4 mg by mouth daily.     Current Facility-Administered Medications  Medication Dose Route Frequency Provider Last Rate Last Dose  . 0.9 %  sodium chloride infusion  500 mL  Intravenous Continuous Danis, Estill Cotta III, MD        Allergies:   Olmesartan medoxomil, Quinapril hcl, Glucosamine, Livalo [pitavastatin], Rosuvastatin, and Verapamil    Social History:  The patient  reports that he quit smoking about 33 years ago. He has a 18.00 pack-year smoking history. He has never used smokeless tobacco. He reports that he does not drink alcohol or use drugs.   Family History:  The patient's family history includes Diabetes in his maternal grandmother; Heart attack (age of onset: 42) in his father; Heart attack (age of onset: 41) in his brother; Hyperlipidemia in his brother and mother; Hypertension in his father; Pulmonary embolism in his brother; Stroke in his brother.    ROS:  General:no colds or fevers, no weight changes CV:see HPI PUL:see HPI GI:no diarrhea constipation or melena, no indigestion  Wt Readings from Last 3 Encounters:  10/10/18 196 lb 12.8 oz (89.3 kg)  09/17/18 200 lb 12.8 oz (91.1 kg)  11/27/17 203 lb 1.9 oz (92.1 kg)     PHYSICAL EXAM: VS:  BP 126/82   Pulse 83   Ht 6\' 2"  (1.88 m)   Wt 196 lb 12.8 oz (89.3 kg)   SpO2 98%   BMI 25.27 kg/m  , BMI Body mass index is 25.27 kg/m. General:Pleasant affect, NAD Skin:Warm and dry, brisk capillary refill Heart:S1S2 RRR without murmur, gallup, rub or click Lungs:clear without rales, rhonchi, or wheezes Ext:no lower ext edema, 2+ pedal pulses, 2+ radial pulses Neuro:alert and oriented, MAE, follows commands, + facial symmetry    EKG:  EKG is NOT ordered today.   Recent Labs: 11/27/2017: ALT 17; BUN 19; Creatinine, Ser 1.08; Hemoglobin 13.0; Platelets 255.0; Potassium 4.1; Sodium 140    Lipid Panel    Component Value Date/Time   CHOL 142 03/01/2017 1438   CHOL 157 10/05/2016 0830   TRIG 52.0 03/01/2017 1438   HDL 58.00 03/01/2017 1438   HDL 60 10/05/2016 0830   CHOLHDL 2 03/01/2017 1438   VLDL 10.4 03/01/2017 1438   LDLCALC 74 03/01/2017 1438   LDLCALC 86 10/05/2016 0830    LDLDIRECT 130.4 11/17/2009 0853       Other studies Reviewed: Additional studies/ records that were reviewed today include: last OV notes.   ASSESSMENT AND PLAN:  1.  Medication side effects, all improved and off BB.  BP stable will not add BB back.  2.  CAD with CABG in 2014, no chest pain, on plavix due to intolerance to aSA  3.  HLD Lipids checked today  4.  HTN on amlodipine stable.  Follow up with Dr. Johnsie Cancel in 6 months     Current medicines are reviewed with the patient today.  The patient Has no concerns regarding medicines.  The following changes have been made:  See above Labs/ tests ordered today include:see above  Disposition:   FU:  see above  Signed, Cecilie Kicks, NP  10/10/2018 10:06 AM    [facility] [address], [place], [place], [place] Phone: [phone]; Fax: [phone]

## 2018-10-10 ENCOUNTER — Other Ambulatory Visit: Payer: Self-pay

## 2018-10-10 ENCOUNTER — Ambulatory Visit (INDEPENDENT_AMBULATORY_CARE_PROVIDER_SITE_OTHER): Payer: BC Managed Care – PPO | Admitting: Cardiology

## 2018-10-10 ENCOUNTER — Other Ambulatory Visit: Payer: BC Managed Care – PPO | Admitting: *Deleted

## 2018-10-10 ENCOUNTER — Encounter: Payer: Self-pay | Admitting: Cardiology

## 2018-10-10 ENCOUNTER — Other Ambulatory Visit: Payer: Self-pay | Admitting: Cardiology

## 2018-10-10 VITALS — BP 126/82 | HR 83 | Ht 74.0 in | Wt 196.8 lb

## 2018-10-10 DIAGNOSIS — E782 Mixed hyperlipidemia: Secondary | ICD-10-CM

## 2018-10-10 DIAGNOSIS — I2510 Atherosclerotic heart disease of native coronary artery without angina pectoris: Secondary | ICD-10-CM | POA: Diagnosis not present

## 2018-10-10 DIAGNOSIS — I10 Essential (primary) hypertension: Secondary | ICD-10-CM

## 2018-10-10 DIAGNOSIS — T887XXA Unspecified adverse effect of drug or medicament, initial encounter: Secondary | ICD-10-CM

## 2018-10-10 LAB — COMPREHENSIVE METABOLIC PANEL
ALT: 14 IU/L (ref 0–44)
AST: 19 IU/L (ref 0–40)
Albumin/Globulin Ratio: 1.4 (ref 1.2–2.2)
Albumin: 4.2 g/dL (ref 3.8–4.8)
Alkaline Phosphatase: 88 IU/L (ref 39–117)
BUN/Creatinine Ratio: 17 (ref 10–24)
BUN: 18 mg/dL (ref 8–27)
Bilirubin Total: 0.3 mg/dL (ref 0.0–1.2)
CO2: 22 mmol/L (ref 20–29)
Calcium: 9.8 mg/dL (ref 8.6–10.2)
Chloride: 102 mmol/L (ref 96–106)
Creatinine, Ser: 1.06 mg/dL (ref 0.76–1.27)
GFR calc Af Amer: 82 mL/min/{1.73_m2} (ref >59)
GFR calc non Af Amer: 71 mL/min/{1.73_m2} (ref >59)
Globulin, Total: 2.9 g/dL (ref 1.5–4.5)
Glucose: 94 mg/dL (ref 65–99)
Potassium: 4.1 mmol/L (ref 3.5–5.2)
Sodium: 139 mmol/L (ref 134–144)
Total Protein: 7.1 g/dL (ref 6.0–8.5)

## 2018-10-10 LAB — LIPID PANEL
Chol/HDL Ratio: 3.7 ratio (ref 0.0–5.0)
Cholesterol, Total: 213 mg/dL — ABNORMAL HIGH (ref 100–199)
HDL: 58 mg/dL (ref >39)
LDL Chol Calc (NIH): 143 mg/dL — ABNORMAL HIGH (ref 0–99)
Triglycerides: 65 mg/dL (ref 0–149)
VLDL Cholesterol Cal: 12 mg/dL (ref 5–40)

## 2018-10-10 NOTE — Patient Instructions (Signed)
Medication Instructions:   Your physician recommends that you continue on your current medications as directed. Please refer to the Current Medication list given to you today.  If you need a refill on your cardiac medications before your next appointment, please call your pharmacy.   Lab work:  None ordered today  If you have labs (blood work) drawn today and your tests are completely normal, you will receive your results only by: Marland Kitchen MyChart Message (if you have MyChart) OR . A paper copy in the mail If you have any lab test that is abnormal or we need to change your treatment, we will call you to review the results.  Testing/Procedures:  None ordered today  Follow-Up: At [facility], you and your health needs are our priority.  As part of our continuing mission to provide you with exceptional heart care, we have created designated Provider Care Teams.  These Care Teams include your primary Cardiologist (physician) and Advanced Practice Providers (APPs -  Physician Assistants and Nurse Practitioners) who all work together to provide you with the care you need, when you need it. You will need a follow up appointment in 6 months.  Please call our office 2 months in advance to schedule this appointment.  You may see Jenkins Rouge, MD or one of the following Advanced Practice Providers on your designated Care Team:   Truitt Merle, NP Cecilie Kicks, NP . Kathyrn Drown, NP

## 2018-10-11 ENCOUNTER — Telehealth: Payer: Self-pay

## 2018-10-11 DIAGNOSIS — E785 Hyperlipidemia, unspecified: Secondary | ICD-10-CM

## 2018-10-11 NOTE — Telephone Encounter (Signed)
-----   Message from Isaiah Serge, NP sent at 10/10/2018  6:11 PM EDT ----- Cholesterol is very elevated for him.  He has had intolerances to statins, would he go to lipid clinic for finding medication to control cholesterol.  If so please arrange her with pharmacy.

## 2018-10-11 NOTE — Telephone Encounter (Signed)
The patient has been notified of the result and verbalized understanding.  All questions (if any) were answered. Frederik Schmidt, RN 10/11/2018 10:48 AM   He is willing to meet with Lipid Clinic here with the pharmacist.  Will arrange. 9/4

## 2018-10-11 NOTE — Addendum Note (Signed)
Addended by: Frederik Schmidt on: 10/11/2018 10:59 AM   Modules accepted: Orders

## 2018-10-11 NOTE — Telephone Encounter (Signed)
Notes recorded by Frederik Schmidt, RN on 10/11/2018 at 9:06 AM EDT  Lpmtcb 9/4  ------

## 2018-10-11 NOTE — Telephone Encounter (Signed)
Follow up   Patient is returning call about lab results per the previous message. Please call.

## 2018-10-26 ENCOUNTER — Other Ambulatory Visit: Payer: Self-pay | Admitting: Cardiovascular Disease

## 2018-11-04 ENCOUNTER — Ambulatory Visit: Payer: BC Managed Care – PPO

## 2018-12-05 ENCOUNTER — Ambulatory Visit: Payer: BC Managed Care – PPO

## 2019-03-30 ENCOUNTER — Ambulatory Visit: Payer: BC Managed Care – PPO | Attending: Internal Medicine

## 2019-03-30 DIAGNOSIS — Z23 Encounter for immunization: Secondary | ICD-10-CM | POA: Insufficient documentation

## 2019-03-30 NOTE — Progress Notes (Signed)
   Covid-19 Vaccination Clinic  Name:  Eric Dickerson    MRN: [id number] DOB: 03/25/1950  03/30/2019  [name] was observed post Covid-19 immunization for 15 minutes without incidence. He was provided with Vaccine Information Sheet and instruction to access the V-Safe system.   [name] was instructed to call 911 with any severe reactions post vaccine: Marland Kitchen Difficulty breathing  . Swelling of your face and throat  . A fast heartbeat  . A bad rash all over your body  . Dizziness and weakness    Immunizations Administered    Name Date Dose VIS Date Route   Pfizer COVID-19 Vaccine 03/30/2019 10:51 AM 0.3 mL 01/17/2019 Intramuscular   Manufacturer: [place]   Lot: [id number]   [place]: [id number]

## 2019-04-22 ENCOUNTER — Ambulatory Visit: Payer: BC Managed Care – PPO | Admitting: Internal Medicine

## 2019-04-22 ENCOUNTER — Ambulatory Visit: Payer: BC Managed Care – PPO | Attending: Internal Medicine

## 2019-04-22 ENCOUNTER — Encounter: Payer: Self-pay | Admitting: Internal Medicine

## 2019-04-22 ENCOUNTER — Other Ambulatory Visit: Payer: Self-pay

## 2019-04-22 VITALS — BP 144/80 | HR 80 | Temp 97.8°F | Ht 74.0 in | Wt 200.6 lb

## 2019-04-22 DIAGNOSIS — Z0001 Encounter for general adult medical examination with abnormal findings: Secondary | ICD-10-CM

## 2019-04-22 DIAGNOSIS — M545 Low back pain, unspecified: Secondary | ICD-10-CM | POA: Insufficient documentation

## 2019-04-22 DIAGNOSIS — E611 Iron deficiency: Secondary | ICD-10-CM

## 2019-04-22 DIAGNOSIS — E538 Deficiency of other specified B group vitamins: Secondary | ICD-10-CM

## 2019-04-22 DIAGNOSIS — R7309 Other abnormal glucose: Secondary | ICD-10-CM

## 2019-04-22 DIAGNOSIS — Z23 Encounter for immunization: Secondary | ICD-10-CM

## 2019-04-22 DIAGNOSIS — R350 Frequency of micturition: Secondary | ICD-10-CM

## 2019-04-22 DIAGNOSIS — M5441 Lumbago with sciatica, right side: Secondary | ICD-10-CM

## 2019-04-22 DIAGNOSIS — E559 Vitamin D deficiency, unspecified: Secondary | ICD-10-CM | POA: Diagnosis not present

## 2019-04-22 DIAGNOSIS — G8929 Other chronic pain: Secondary | ICD-10-CM

## 2019-04-22 DIAGNOSIS — I10 Essential (primary) hypertension: Secondary | ICD-10-CM

## 2019-04-22 LAB — POCT URINALYSIS DIPSTICK OB
Spec Grav, UA: 1.015 (ref 1.010–1.025)
Urobilinogen, UA: 0.2 E.U./dL
pH, UA: 6 (ref 5.0–8.0)

## 2019-04-22 LAB — BASIC METABOLIC PANEL
BUN: 16 mg/dL (ref 6–23)
CO2: 27 mEq/L (ref 19–32)
Calcium: 9.8 mg/dL (ref 8.4–10.5)
Chloride: 104 mEq/L (ref 96–112)
Creatinine, Ser: 0.96 mg/dL (ref 0.40–1.50)
GFR: 93.78 mL/min (ref >60.00)
Glucose, Bld: 89 mg/dL (ref 70–99)
Potassium: 4 mEq/L (ref 3.5–5.1)
Sodium: 140 mEq/L (ref 135–145)

## 2019-04-22 LAB — CBC WITH DIFFERENTIAL/PLATELET
Basophils Absolute: 0 10*3/uL (ref 0.0–0.1)
Basophils Relative: 0.6 % (ref 0.0–3.0)
Eosinophils Absolute: 0.1 10*3/uL (ref 0.0–0.7)
Eosinophils Relative: 1.3 % (ref 0.0–5.0)
HCT: 40 % (ref 39.0–52.0)
Hemoglobin: 13.3 g/dL (ref 13.0–17.0)
Lymphocytes Relative: 23.3 % (ref 12.0–46.0)
Lymphs Abs: 1.2 10*3/uL (ref 0.7–4.0)
MCHC: 33.3 g/dL (ref 30.0–36.0)
MCV: 86.7 fl (ref 78.0–100.0)
Monocytes Absolute: 0.5 10*3/uL (ref 0.1–1.0)
Monocytes Relative: 10 % (ref 3.0–12.0)
Neutro Abs: 3.5 10*3/uL (ref 1.4–7.7)
Neutrophils Relative %: 64.8 % (ref 43.0–77.0)
Platelets: 235 10*3/uL (ref 150.0–400.0)
RBC: 4.62 Mil/uL (ref 4.22–5.81)
RDW: 14.7 % (ref 11.5–15.5)
WBC: 5.3 10*3/uL (ref 4.0–10.5)

## 2019-04-22 LAB — LIPID PANEL
Cholesterol: 227 mg/dL — ABNORMAL HIGH (ref 0–200)
HDL: 55.4 mg/dL (ref >39.00)
LDL Cholesterol: 157 mg/dL — ABNORMAL HIGH (ref 0–99)
NonHDL: 171.19
Total CHOL/HDL Ratio: 4
Triglycerides: 72 mg/dL (ref 0.0–149.0)
VLDL: 14.4 mg/dL (ref 0.0–40.0)

## 2019-04-22 LAB — HEPATIC FUNCTION PANEL
ALT: 17 U/L (ref 0–53)
AST: 20 U/L (ref 0–37)
Albumin: 4 g/dL (ref 3.5–5.2)
Alkaline Phosphatase: 81 U/L (ref 39–117)
Bilirubin, Direct: 0.1 mg/dL (ref 0.0–0.3)
Total Bilirubin: 0.4 mg/dL (ref 0.2–1.2)
Total Protein: 7.2 g/dL (ref 6.0–8.3)

## 2019-04-22 LAB — HEMOGLOBIN A1C: Hgb A1c MFr Bld: 5.9 % (ref 4.6–6.5)

## 2019-04-22 LAB — VITAMIN D 25 HYDROXY (VIT D DEFICIENCY, FRACTURES): VITD: 41.03 ng/mL (ref 30.00–100.00)

## 2019-04-22 LAB — VITAMIN B12: Vitamin B-12: 456 pg/mL (ref 211–911)

## 2019-04-22 LAB — PSA: PSA: 4.36 ng/mL — ABNORMAL HIGH (ref 0.10–4.00)

## 2019-04-22 LAB — TSH: TSH: 2.15 u[IU]/mL (ref 0.35–4.50)

## 2019-04-22 LAB — IBC PANEL
Iron: 71 ug/dL (ref 42–165)
Saturation Ratios: 24.3 % (ref 20.0–50.0)
Transferrin: 209 mg/dL — ABNORMAL LOW (ref 212.0–360.0)

## 2019-04-22 NOTE — Assessment & Plan Note (Signed)
stable overall by history and exam, recent data reviewed with pt, and pt to continue medical treatment as before,  to f/u any worsening symptoms or concerns  

## 2019-04-22 NOTE — Assessment & Plan Note (Addendum)
Etiology unclear for ls spine mri  I spent 32 minutes in preparing to see the patient by review of recent labs, imaging and procedures, obtaining and reviewing separately obtained history, communicating with the patient and family or caregiver, ordering medications, tests or procedures, and documenting clinical information in the EHR including the differential Dx, treatment, and any further evaluation and other management of LBP, urinary frequency, insomnia, HTN

## 2019-04-22 NOTE — Progress Notes (Signed)
   Covid-19 Vaccination Clinic  Name:  Eric Dickerson    MRN: [id number] DOB: 1950-08-10  04/22/2019  Eric Dickerson was observed post Covid-19 immunization for 15 minutes without incident. He was provided with Vaccine Information Sheet and instruction to access the V-Safe system.   Eric Dickerson was instructed to call 911 with any severe reactions post vaccine: Marland Kitchen Difficulty breathing  . Swelling of face and throat  . A fast heartbeat  . A bad rash all over body  . Dizziness and weakness   Immunizations Administered    Name Date Dose VIS Date Route   Pfizer COVID-19 Vaccine 04/22/2019  3:08 PM 0.3 mL 01/17/2019 Intramuscular   Manufacturer: [place]   Lot: [id number]   [place]: [id number]

## 2019-04-22 NOTE — Patient Instructions (Signed)
Please continue all other medications as before, and refills have been done if requested.  Please have the pharmacy call with any other refills you may need.  Please continue your efforts at being more active, low cholesterol diet, and weight control.  You are otherwise up to date with prevention measures today.  Please keep your appointments with your specialists as you may have planned  You will be contacted regarding the referral for: MRI for the lower back  Please go to the LAB at the blood drawing area for the tests to be done  You will be contacted by phone if any changes need to be made immediately.  Otherwise, you will receive a letter about your results with an explanation, but please check with MyChart first.  Please remember to sign up for MyChart if you have not done so, as this will be important to you in the future with finding out test results, communicating by private email, and scheduling acute appointments online when needed.  Please make an Appointment to return in 6 months, or sooner if needed

## 2019-04-22 NOTE — Assessment & Plan Note (Signed)

## 2019-04-22 NOTE — Assessment & Plan Note (Addendum)
Chronic stabel, udip neg, declines urology referral

## 2019-04-22 NOTE — Progress Notes (Signed)
Subjective:    Patient ID: Eric Dickerson, male    DOB: 1950-06-18, 69 y.o.   MRN: [id number]  HPI Here for wellness and f/u;  Overall doing ok;  Pt denies Chest pain, worsening SOB, DOE, wheezing, orthopnea, PND, worsening LE edema, palpitations, dizziness or syncope.  Pt denies neurological change such as new headache, facial or extremity weakness.  Pt denies polydipsia, polyuria, or low sugar symptoms. Pt states overall good compliance with treatment and medications, good tolerability, and has been trying to follow appropriate diet.  Pt denies worsening depressive symptoms, suicidal ideation or panic. No fever, night sweats, wt loss, loss of appetite, or other constitutional symptoms.  Pt states good ability with ADL's, has low fall risk, home safety reviewed and adequate, no other significant changes in hearing or vision, and only occasionally active with exercise. Has been statin intolerant, seeing lipid clinic. Also Pt continues to have recurring right LBP with radiation to the right lateral hip area, now worse in last few months, no bowel or bladder change, fever, wt loss,  worsening LE pain/numbness/weakness, gait change or falls. Can only stand about 2 hrs at work before has to sit down.  Has seen Dr Alvan Dame 1 yr ago, right hip replacement felt stable at that time Past Medical History:  Diagnosis Date  . ANEMIA, MILD   . CAD (coronary artery disease)    a. CAD,  b. s/p Cypher DES to the ramus and Promus DES x 2 to the RCA 08/2007 (minimal disease in LAD and CFX at that time), c. Myoview 8/10: EF 63%, inf thinning, small prior ant infarct, no ischemia;  d. s/p CABG 3/14 (L-LAD, RIMA-OM1)  . DEGENERATIVE JOINT DISEASE, RIGHT HIP   . DIVERTICULOSIS, COLON   . ERECTILE DYSFUNCTION   . GERD   . Heart murmur   . HIATAL HERNIA   . HTN (hypertension)   . Hyperlipidemia    statin intolerant  . HYPERPLASIA PROSTATE UNS W/O UR OBST & OTH LUTS   . LUMBAR RADICULOPATHY, RIGHT   . Other abnormal  glucose   . PAD (peripheral artery disease) ([place])    pre CABG 04/2012 => ABIs: Right 0.92, left 0.61 // ABI 09/02/14: R 0.93; L 0.82  . SPINAL STENOSIS, LUMBAR    Past Surgical History:  Procedure Laterality Date  . COLONOSCOPY  2002   negative  . CORONARY ARTERY BYPASS GRAFT N/A 04/22/2012   Procedure: CORONARY ARTERY BYPASS GRAFTING (CABG);  Surgeon: [name], MD;  Location: [place];  Service: Open Heart Surgery;  Laterality: N/A;  CABG X 2, BIMA, POSSIBLE EVH  . CORONARY STENT PLACEMENT  2010    X 2  . TEE WITHOUT CARDIOVERSION N/A 04/22/2012   Procedure: TRANSESOPHAGEAL ECHOCARDIOGRAM (TEE);  Surgeon: [name], MD;  Location: [place];  Service: Open Heart Surgery;  Laterality: N/A;  . TOTAL HIP ARTHROPLASTY  2009    reports that he quit smoking about 34 years ago. He has a 18.00 pack-year smoking history. He has never used smokeless tobacco. He reports that he does not drink alcohol or use drugs. family history includes Diabetes in his maternal grandmother; Heart attack (age of onset: 40) in his father; Heart attack (age of onset: 41) in his brother; Hyperlipidemia in his brother and mother; Hypertension in his father; Pulmonary embolism in his brother; Stroke in his brother. Allergies  Allergen Reactions  . Olmesartan Medoxomil Rash  . Quinapril Hcl Other (See Comments)     cough; ACE  inhibitors are contraindicated because of a history of rash with Angiotensin receptor blocker.  . Glucosamine Other (See Comments)    Doesn't remember reaction  . Livalo [Pitavastatin]     cramps  . Rosuvastatin Other (See Comments)    : muscle cramps  . Verapamil Other (See Comments)    Extra sensitive to sun   Current Outpatient Medications on File Prior to Visit  Medication Sig Dispense Refill  . amLODipine (NORVASC) 10 MG tablet TAKE 1 TABLET BY MOUTH DAILY. MAKE APPT WITH MD 90 tablet 3  . clopidogrel (PLAVIX) 75 MG tablet Take 1 tablet (75 mg total) by mouth daily. Please  keep upcoming appt for more refills 30 tablet 0  . fluticasone (FLONASE) 50 MCG/ACT nasal spray Place 2 sprays into both nostrils daily. 16 g 6  . Multiple Vitamin (MULTIVITAMIN WITH MINERALS) TABS Take 1 tablet by mouth daily. Centrum silver    . nitroGLYCERIN (NITROSTAT) 0.4 MG SL tablet PLACE 1 TABLET UNDER THE TONGUE EVER Y 5 MINUTES AS NEEDED FOR CHEST PAIN (3 DOSES MAXIMUM) 25 tablet 0  . tamsulosin (FLOMAX) 0.4 MG CAPS capsule Take 0.4 mg by mouth daily.     Current Facility-Administered Medications on File Prior to Visit  Medication Dose Route Frequency Provider Last Rate Last Admin  . 0.9 %  sodium chloride infusion  500 mL Intravenous Continuous Danis, Estill Cotta III, MD       Review of Systems All otherwise neg per pt     Objective:   Physical Exam BP (!) 144/80   Pulse 80   Temp 97.8 F (36.6 C)   Ht 6\' 2"  (1.88 m)   Wt 200 lb 9.6 oz (91 kg)   SpO2 100%   BMI 25.76 kg/m  VS noted,  Constitutional: Pt appears in NAD HENT: Head: NCAT.  Right Ear: External ear normal.  Left Ear: External ear normal.  Eyes: . Pupils are equal, round, and reactive to light. Conjunctivae and EOM are normal Nose: without d/c or deformity Neck: Neck supple. Gross normal ROM Cardiovascular: Normal rate and regular rhythm.   Pulmonary/Chest: Effort normal and breath sounds without rales or wheezing.  Abd:  Soft, NT, ND, + BS, no organomegaly Neurological: Pt is alert. At baseline orientation, motor grossly intact Skin: Skin is warm. No rashes, other new lesions, no LE edema Psychiatric: Pt behavior is normal without agitation  All otherwise neg per pt Lab Results  Component Value Date   WBC 5.3 04/22/2019   HGB 13.3 04/22/2019   HCT 40.0 04/22/2019   PLT 235.0 04/22/2019   GLUCOSE 89 04/22/2019   CHOL 227 (H) 04/22/2019   TRIG 72.0 04/22/2019   HDL 55.40 04/22/2019   LDLDIRECT 130.4 11/17/2009   LDLCALC 157 (H) 04/22/2019   ALT 17 04/22/2019   AST 20 04/22/2019   NA 140 04/22/2019    K 4.0 04/22/2019   CL 104 04/22/2019   CREATININE 0.96 04/22/2019   BUN 16 04/22/2019   CO2 27 04/22/2019   TSH 2.15 04/22/2019   PSA 4.36 (H) 04/22/2019   INR 1.28 04/22/2012   HGBA1C 5.9 04/22/2019      Assessment & Plan:

## 2019-04-29 NOTE — Progress Notes (Signed)
Cardiology Office Note   Date:  05/02/2019   ID:  Eric Dickerson, DOB 1950-11-19, MRN [id number]  PCP:  [name], MD  Cardiologist:  Dr. Johnsie Cancel    No chief complaint on file.     History of Present Illness: Eric Dickerson is a 69 y.o. male who presents for f/u CAD   He has ahx of CAD, s/p Cypher DES to the ramus and Promus DES x 2 to the RCA 08/2007 (minimal disease in LAD and CFX at that time), PAD, HL, HTN. He developed progressively worsening chest pain in 3/14 and LHC demonstrated 3 v CAD. He was referred for CABG by Dr. Roxan Hockey 04/22/12 (LIMA-LAD, free RIMA-OM1).   Cannot take asa due to GERD,  So has been maintained on plavix   Beta blocker d/c due to perception that it worsened his back pain and made him urinate more frequently    He is still working at [employer] and practices social distancing, wears mask and has face shield and washes hands freq.  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   Lipids are not well Rx Has had myalgias with crestor and livalo. 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 ANEMIA, MILD   . CAD (coronary artery disease)    a. CAD,  b. s/p Cypher DES to the ramus and Promus DES x 2 to the RCA 08/2007 (minimal disease in LAD and CFX at that time), c. Myoview 8/10: EF 63%, inf thinning, small prior ant infarct, no ischemia;  d. s/p CABG 3/14 (L-LAD, RIMA-OM1)  . DEGENERATIVE JOINT DISEASE, RIGHT HIP   . DIVERTICULOSIS, COLON   . ERECTILE DYSFUNCTION   . GERD   . Heart murmur   . HIATAL HERNIA   . HTN (hypertension)   . Hyperlipidemia    statin intolerant  . HYPERPLASIA PROSTATE UNS W/O UR OBST & OTH LUTS   . LUMBAR RADICULOPATHY, RIGHT   . Other abnormal glucose   . PAD (peripheral artery disease) ([place])    pre CABG 04/2012 =>  ABIs: Right 0.92, left 0.61 // ABI 09/02/14: R 0.93; L 0.82  . SPINAL STENOSIS, LUMBAR     Past Surgical History:  Procedure Laterality Date  . COLONOSCOPY  2002   negative  . CORONARY ARTERY BYPASS GRAFT N/A 04/22/2012   Procedure: CORONARY ARTERY BYPASS GRAFTING (CABG);  Surgeon: [name], MD;  Location: [facility];  Service: Open Heart Surgery;  Laterality: N/A;  CABG X 2, BIMA, POSSIBLE EVH  . CORONARY STENT PLACEMENT  2010    X 2  . TEE WITHOUT CARDIOVERSION N/A 04/22/2012   Procedure: TRANSESOPHAGEAL ECHOCARDIOGRAM (TEE);  Surgeon: [name], MD;  Location: [facility];  Service: Open Heart Surgery;  Laterality: N/A;  . TOTAL HIP ARTHROPLASTY  2009     Current Outpatient Medications  Medication Sig Dispense Refill  . amLODipine (NORVASC) 10 MG tablet TAKE 1 TABLET BY MOUTH DAILY. MAKE APPT WITH MD 90 tablet 3  . clopidogrel (PLAVIX) 75 MG tablet Take 75 mg by mouth 2 (two) times a week.    . fluticasone (FLONASE) 50 MCG/ACT nasal spray Place 2 sprays into both nostrils daily. 16 g 6  . Multiple Vitamin (MULTIVITAMIN  WITH MINERALS) TABS Take 1 tablet by mouth daily. Centrum silver    . nitroGLYCERIN (NITROSTAT) 0.4 MG SL tablet PLACE 1 TABLET UNDER THE TONGUE EVER Y 5 MINUTES AS NEEDED FOR CHEST PAIN (3 DOSES MAXIMUM) 25 tablet 0  . tamsulosin (FLOMAX) 0.4 MG CAPS capsule Take 0.4 mg by mouth daily.     Current Facility-Administered Medications  Medication Dose Route Frequency Provider Last Rate Last Admin  . 0.9 %  sodium chloride infusion  500 mL Intravenous Continuous Danis, Estill Cotta III, MD        Allergies:   Olmesartan medoxomil, Quinapril hcl, Glucosamine, Livalo [pitavastatin], Rosuvastatin, and Verapamil    Social History:  The patient  reports that he quit smoking about 34 years ago. He has a 18.00 pack-year smoking history. He has never used smokeless tobacco. He reports that he does not drink alcohol or use drugs.   Family History:  The patient's family  history includes Diabetes in his maternal grandmother; Heart attack (age of onset: 24) in his father; Heart attack (age of onset: 21) in his brother; Hyperlipidemia in his brother and mother; Hypertension in his father; Pulmonary embolism in his brother; Stroke in his brother.    ROS:  General:no colds or fevers, no weight changes CV:see HPI PUL:see HPI GI:no diarrhea constipation or melena, no indigestion  Wt Readings from Last 3 Encounters:  05/02/19 199 lb (90.3 kg)  04/22/19 200 lb 9.6 oz (91 kg)  10/10/18 196 lb 12.8 oz (89.3 kg)     PHYSICAL EXAM: VS:  BP 134/82   Pulse 93   Ht 6\' 2"  (1.88 m)   Wt 199 lb (90.3 kg)   SpO2 98%   BMI 25.55 kg/m  , BMI Body mass index is 25.55 kg/m.  Affect appropriate Healthy:  appears stated age 29: normal Neck supple with no adenopathy JVP normal no bruits no thyromegaly Lungs clear with no wheezing and good diaphragmatic motion Heart:  S1/S2 no murmur, no rub, gallop or click PMI normal post sternotomy  Abdomen: benighn, BS positve, no tenderness, no AAA no bruit.  No HSM or HJR Distal pulses intact with no bruits No edema Neuro non-focal Skin warm and dry No muscular weakness     EKG:  EKG is NOT ordered today.   Recent Labs: 04/22/2019: ALT 17; BUN 16; Creatinine, Ser 0.96; Hemoglobin 13.3; Platelets 235.0; Potassium 4.0; Sodium 140; TSH 2.15    Lipid Panel    Component Value Date/Time   CHOL 227 (H) 04/22/2019 0930   CHOL 213 (H) 10/10/2018 0951   TRIG 72.0 04/22/2019 0930   HDL 55.40 04/22/2019 0930   HDL 58 10/10/2018 0951   CHOLHDL 4 04/22/2019 0930   VLDL 14.4 04/22/2019 0930   LDLCALC 157 (H) 04/22/2019 0930   LDLCALC 143 (H) 10/10/2018 0951   LDLDIRECT 130.4 11/17/2009 0853       Other studies Reviewed: Additional studies/ records that were reviewed today include: last OV notes.   ASSESSMENT AND PLAN:  1.  CAD with CABG in 2014, no chest pain, on plavix due to intolerance to aSA No beta  blocker due to side effects stable I told him if pulse stays high or BP up  To call us and we would try alternative beta blocker   2.  HLD  LDL was 157 did not f/u with lipid clinic Has been intolerant to livalo and crestor due to leg cramps seeing lipid clinic Tuesday  3.  HTN on amlodipine stable.  Current medicines are reviewed with the patient today.  The patient Has no concerns regarding medicines.  The following changes have been made:  See above Labs/ tests ordered today include:see above  Disposition:   FU:  see above  Signed, Jenkins Rouge, MD  05/02/2019 11:15 AM    [address], [place], [place], [place] Phone: [phone]; Fax: [phone]

## 2019-04-30 ENCOUNTER — Other Ambulatory Visit: Payer: Self-pay | Admitting: Internal Medicine

## 2019-04-30 DIAGNOSIS — M545 Low back pain, unspecified: Secondary | ICD-10-CM

## 2019-05-02 ENCOUNTER — Other Ambulatory Visit: Payer: Self-pay

## 2019-05-02 ENCOUNTER — Encounter: Payer: Self-pay | Admitting: Cardiovascular Disease

## 2019-05-02 ENCOUNTER — Ambulatory Visit: Payer: BC Managed Care – PPO | Admitting: Cardiovascular Disease

## 2019-05-02 VITALS — BP 134/82 | HR 93 | Ht 74.0 in | Wt 199.0 lb

## 2019-05-02 DIAGNOSIS — Z951 Presence of aortocoronary bypass graft: Secondary | ICD-10-CM

## 2019-05-02 NOTE — Patient Instructions (Signed)
Medication Instructions:  °Your physician recommends that you continue on your current medications as directed. Please refer to the Current Medication list given to you today. ° °*If you need a refill on your cardiac medications before your next appointment, please call your pharmacy* ° ° °Lab Work: °None ordered ° °If you have labs (blood work) drawn today and your tests are completely normal, you will receive your results only by: °• MyChart Message (if you have MyChart) OR °• A paper copy in the mail °If you have any lab test that is abnormal or we need to change your treatment, we will call you to review the results. ° ° °Testing/Procedures: °None ordered ° ° °Follow-Up: °At [facility], you and your health needs are our priority.  As part of our continuing mission to provide you with exceptional heart care, we have created designated Provider Care Teams.  These Care Teams include your primary Cardiologist (physician) and Advanced Practice Providers (APPs -  Physician Assistants and Nurse Practitioners) who all work together to provide you with the care you need, when you need it. ° °We recommend signing up for the patient portal called "MyChart".  Sign up information is provided on this After Visit Summary.  MyChart is used to connect with patients for Virtual Visits (Telemedicine).  Patients are able to view lab/test results, encounter notes, upcoming appointments, etc.  Non-urgent messages can be sent to your provider as well.   °To learn more about what you can do with MyChart, go to [email or web address].   ° °Your next appointment:   °12 month(s) ° °The format for your next appointment:   °In Person ° °Provider:   °You may see No primary care provider on file. or one of the following Advanced Practice Providers on your designated Care Team:   °· Lori Gerhardt, NP °· Laura Ingold, NP °· Jill McDaniel, NP ° ° ° °Other Instructions ° ° °

## 2019-05-05 NOTE — Progress Notes (Signed)
Patient ID: Eric Dickerson                 DOB: 1951-02-04                    MRN: [id number]     HPI: Eric Dickerson is a 69 y.o. male patient referred to lipid clinic by Dr Johnsie Cancel. PMH is significant for CAD s/p DES x 2 to the RCA and ramus 08/2007 (f/u revealed 3v disease now s/p CABG 04/22/12), PAD, HL, HTN. Patient has hx of statin intolerance and general medication non adherence.    At last visit with pharmacy in 2018, it was discovered that patient had never started rosuvastatin and had started his Zetia every other day instead of daily. Most of this visit was spent counseling patient and going over medications. At this visit patient stated he was not opposed to [facility] in future but would like to try an oral option first. Patient was started on Crestor 10mg  every other day and continued on Zetia 10mg  every other day (because intolerant to every day dosing). Patient was encouraged to call if he was unable to tolerate this dose of Crestor and the frequency and/or dose could be adjusted. Patient did not call back and repeat lipid panels showed increasing LDL levels above goal. Patient was referred to lipid clinic in September 2020 but did not show.   Patient presents to clinic today for follow up of his hyperlipidemia. Lipid panel 04/22/19 TC 227, TG 72, HDL 55, LDL 157. Patient reports stopping all cholesterol lowering medications 2-3 weeks after we prescribed them at his last visit due to muscle pain. Patient did not remember taking Zetia 10mg  every other day and rosuvastatin 10mg  every other day from his prior visit but believed his muscle pains were from taking two medications at the same time. Patient also expressed concerns about dropping his LDL levels too low due to concerns about "lowering his drive". Patient endorsed trying various home remedies to lower his cholesterol in the past including garlic but they have all caused muscle pains.  Current Medications: none Intolerances: Crestor 20mg   daily and 3 days per week and Crestor 10mg  every other day, Livalo 2mg  3 days per week, pravastatin 40mg  daily and 20mg  3 days per week, Lipitor, Zetia 10mg  daily and every other day Risk Factors: CAD s/p PCI with DES, CABG, PAD, family hx (father died of MI at age 70) LDL goal: 55mg /dL   Family History: The patient's family history includes Diabetes in his maternal grandmother; Heart attack (age of onset: 57) in his father; Heart attack (age of onset: 16) in his brother; Hyperlipidemia in his brother and mother; Hypertension in his father; Pulmonary embolism in his brother; Stroke in his brother.  Social History: The patient reports that he quit smoking about 34 years ago. He has a 18.00 pack-year smoking history. He has never used smokeless tobacco. He reports that he does not drink alcohol or use drugs.  Labs: 04/22/19 TC 227, TG 72, HDL 55, LDL 157 (nothing)  06/29/16: TC 175, TG 52, HDL 56, LDL 109 (Zetia 10mg  every other day)      Past Medical History:  Diagnosis Date  . ANEMIA, MILD   . CAD (coronary artery disease)    a. CAD,  b. s/p Cypher DES to the ramus and Promus DES x 2 to the RCA 08/2007 (minimal disease in LAD and CFX at that time), c. Myoview 8/10: EF 63%, inf thinning, small  prior ant infarct, no ischemia;  d. s/p CABG 3/14 (L-LAD, RIMA-OM1)  . DEGENERATIVE JOINT DISEASE, RIGHT HIP   . DIVERTICULOSIS, COLON   . ERECTILE DYSFUNCTION   . GERD   . HIATAL HERNIA   . HTN (hypertension)   . Hyperlipidemia    statin intolerant  . HYPERPLASIA PROSTATE UNS W/O UR OBST & OTH LUTS   . LUMBAR RADICULOPATHY, RIGHT   . Other abnormal glucose   . PAD (peripheral artery disease) ([place])    pre CABG 04/2012 => ABIs: Right 0.92, left 0.61 // ABI 09/02/14: R 0.93; L 0.82  . SPINAL STENOSIS, LUMBAR           Current Outpatient Prescriptions on File Prior to Visit  Medication Sig Dispense Refill  . amLODipine (NORVASC) 10 MG tablet Take 1 tablet (10 mg total) by  mouth daily. 30 tablet 1  . clopidogrel (PLAVIX) 75 MG tablet Take 1 tablet (75 mg total) by mouth daily. 90 tablet 3  . clopidogrel (PLAVIX) 75 MG tablet Take 1 tablet (75 mg total) by mouth daily. 7 tablet 0  . darifenacin (ENABLEX) 7.5 MG 24 hr tablet Take 1 tablet (7.5 mg total) by mouth daily. 30 tablet 0  . fluticasone (FLONASE) 50 MCG/ACT nasal spray Place 2 sprays into both nostrils daily. 16 g 6  . metoprolol succinate (TOPROL-XL) 25 MG 24 hr tablet TAKE ONE TABLET BY MOUTH EVERY DAY 30 tablet 11  . Multiple Vitamin (MULTIVITAMIN WITH MINERALS) TABS Take 1 tablet by mouth daily. Centrum silver    . nitroGLYCERIN (NITROSTAT) 0.4 MG SL tablet Place 1 tablet (0.4 mg total) under the tongue every 5 (five) minutes as needed for chest pain (3 doses max). 25 tablet 3  . ranitidine (ZANTAC) 150 MG capsule Take 150 mg by mouth 2 (two) times daily as needed for heartburn.    . rosuvastatin (CRESTOR) 20 MG tablet Take 1 tablet (20 mg total) by mouth daily. 30 tablet 11  . tamsulosin (FLOMAX) 0.4 MG CAPS capsule Take 0.4 mg by mouth daily.     No current facility-administered medications on file prior to visit.          Allergies  Allergen Reactions  . Olmesartan Medoxomil Rash  . Quinapril Hcl Other (See Comments)     cough; ACE inhibitors are contraindicated because of a history of rash with Angiotensin receptor blocker.  . Glucosamine Other (See Comments)    Doesn't remember reaction  . Livalo [Pitavastatin]     cramps  . Rosuvastatin Other (See Comments)    : muscle cramps  . Verapamil Other (See Comments)    Extra sensitive to sun    Assessment/Plan:  1. Hyperlipidemia - Based on LDL goal <55, patient is not at goal. PCSK9i were discussed in detail with the patient however he did not want a medication that would last that long in his body if he did begin to have side effects. He also expressed concerned it would drop his LDL too low. Patient was reassured that  LDL levels <55 are safe and have shown to reduce the risk of cardiovascular events. Patient requested to try rosuvastatin 20mg  daily. We will follow up with patient in 8 weeks for labs and encouraged him to give [place] a call if he does experience any side effects and we can discuss Praluent injections (tier 2 on [facility]) further.   Ladoris Gene, PharmD Candidate  Megan E. Supple, PharmD, BCACP, [place]  [place]. [address] [place], [place] [address] Phone: [phone]; Fax: [phone] 05/06/2019 4:00 PM

## 2019-05-06 ENCOUNTER — Other Ambulatory Visit: Payer: Self-pay

## 2019-05-06 ENCOUNTER — Ambulatory Visit (INDEPENDENT_AMBULATORY_CARE_PROVIDER_SITE_OTHER): Payer: BC Managed Care – PPO | Admitting: Pharmacist

## 2019-05-06 DIAGNOSIS — T466X5A Adverse effect of antihyperlipidemic and antiarteriosclerotic drugs, initial encounter: Secondary | ICD-10-CM | POA: Diagnosis not present

## 2019-05-06 DIAGNOSIS — E785 Hyperlipidemia, unspecified: Secondary | ICD-10-CM | POA: Diagnosis not present

## 2019-05-06 DIAGNOSIS — G720 Drug-induced myopathy: Secondary | ICD-10-CM | POA: Diagnosis not present

## 2019-05-06 MED ORDER — ROSUVASTATIN CALCIUM 20 MG PO TABS: 20.0000 mg | ORAL_TABLET | Freq: Every day | ORAL | 11 refills | Status: DC

## 2019-05-06 NOTE — Patient Instructions (Addendum)
It was nice to see you today  Start taking rosuvastatin 20mg  once daily  Call clinic if you have any side effects [phone] and we can try you on Praluent injections 2x a month  Recheck fasting labs on Tuesday, May 25th any time after 7:30am

## 2019-05-28 ENCOUNTER — Other Ambulatory Visit: Payer: BC Managed Care – PPO

## 2019-05-28 ENCOUNTER — Ambulatory Visit
Admission: RE | Admit: 2019-05-28 | Discharge: 2019-05-28 | Disposition: A | Discharge status: Home / Self Care | Payer: BC Managed Care – PPO | Source: Ambulatory Visit | Attending: Internal Medicine | Admitting: Internal Medicine

## 2019-05-28 ENCOUNTER — Other Ambulatory Visit: Payer: Self-pay

## 2019-05-28 DIAGNOSIS — M545 Low back pain, unspecified: Secondary | ICD-10-CM

## 2019-05-28 DIAGNOSIS — G8929 Other chronic pain: Secondary | ICD-10-CM

## 2019-05-28 DIAGNOSIS — M5441 Lumbago with sciatica, right side: Secondary | ICD-10-CM

## 2019-05-30 ENCOUNTER — Other Ambulatory Visit: Payer: Self-pay | Admitting: Internal Medicine

## 2019-05-30 DIAGNOSIS — G8929 Other chronic pain: Secondary | ICD-10-CM

## 2019-06-03 ENCOUNTER — Encounter: Payer: Self-pay | Admitting: Family Medicine

## 2019-06-03 ENCOUNTER — Ambulatory Visit (INDEPENDENT_AMBULATORY_CARE_PROVIDER_SITE_OTHER): Payer: BC Managed Care – PPO | Admitting: Family Medicine

## 2019-06-03 ENCOUNTER — Other Ambulatory Visit: Payer: Self-pay

## 2019-06-03 VITALS — BP 118/84 | HR 73 | Ht 74.0 in | Wt 200.0 lb

## 2019-06-03 DIAGNOSIS — M545 Low back pain: Secondary | ICD-10-CM

## 2019-06-03 DIAGNOSIS — G8929 Other chronic pain: Secondary | ICD-10-CM | POA: Diagnosis not present

## 2019-06-03 NOTE — Patient Instructions (Addendum)
Thank you for coming in today. Plan for physical therapy.  Recheck with me in 4 weeks.  If not improving we can get set up for a back injection.  Let me know if you are not doing well.  I am here for you.   Facet Blocks Patient Information  Description: The facets are joints in the spine between the vertebrae.  Like any joints in the body, facets can become irritated and painful.  Arthritis can also effect the facets.  By injecting steroids and local anesthetic in and around these joints, we can temporarily block the nerve supply to them.  Steroids act directly on irritated nerves and tissues to reduce selling and inflammation which often leads to decreased pain.  Facet blocks may be done anywhere along the spine from the neck to the low back depending upon the location of your pain.   After numbing the skin with local anesthetic (like Novocaine), a small needle is passed onto the facet joints under x-ray guidance.  You may experience a sensation of pressure while this is being done.  The entire block usually lasts about 15-25 minutes.   Conditions which may be treated by facet blocks:   Low back/buttock pain  Neck/shoulder pain  Certain types of headaches  Preparation for the injection:  1. Do not eat any solid food or dairy products within 8 hours of your appointment. 2. You may drink clear liquid up to 3 hours before appointment.  Clear liquids include water, black coffee, juice or soda.  No milk or cream please. 3. You may take your regular medication, including pain medications, with a sip of water before your appointment.  Diabetics should hold regular insulin (if taken separately) and take 1/2 normal NPH dose the morning of the procedure.  Carry some sugar containing items with you to your appointment. 4. A driver must accompany you and be prepared to drive you home after your procedure. 5. Bring all your current medications with you. 6. An IV may be inserted and sedation may be  given at the discretion of the physician. 7. A blood pressure cuff, EKG and other monitors will often be applied during the procedure.  Some patients may need to have extra oxygen administered for a short period. 8. You will be asked to provide medical information, including your allergies and medications, prior to the procedure.  We must know immediately if you are taking blood thinners (like Coumadin/Warfarin) or if you are allergic to IV iodine contrast (dye).  We must know if you could possible be pregnant.  Possible side-effects:   Bleeding from needle site  Infection (rare, may require surgery)  Nerve injury (rare)  Numbness & tingling (temporary)  Difficulty urinating (rare, temporary)  Spinal headache (a headache worse with upright posture)  Light-headedness (temporary)  Pain at injection site (serveral days)  Decreased blood pressure (rare, temporary)  Weakness in arm/leg (temporary)  Pressure sensation in back/neck (temporary)   Call if you experience:   Fever/chills associated with headache or increased back/neck pain  Headache worsened by an upright position  New onset, weakness or numbness of an extremity below the injection site  Hives or difficulty breathing (go to the emergency room)  Inflammation or drainage at the injection site(s)  Severe back/neck pain greater than usual  New symptoms which are concerning to you  Please note:  Although the local anesthetic injected can often make your back or neck feel good for several hours after the injection, the pain will  likely return. It takes 3-7 days for steroids to work.  You may not notice any pain relief for at least one week.  If effective, we will often do a series of 2-3 injections spaced 3-6 weeks apart to maximally decrease your pain.  After the initial series, you may be a candidate for a more permanent nerve block of the facets.

## 2019-06-03 NOTE — Progress Notes (Signed)
Subjective:    I'm seeing this patient as a consultation for:  Dr Jenny Reichmann. Note will be routed back to referring provider/PCP.  CC: Low back pain and radiating pain into R LE  I, Judy Pimple, am serving as a Education administrator for Dr. Lynne Leader.  HPI: PT is a 69 y/o male presenting w/ c/o of low back pain and radiating pain into his R low back he rates his pain 6-7/10 as and describes his pain as weakness and stiffness more than pain, but when it radiates down the leg it feels sharp.  He has a hx of R THA.  Pain is most dominantly located right lower back into the right buttocks.  He does not have pain radiating to his legs.  No weakness or numbness distally.  No bowel bladder dysfunction.  Radiating pain: yes into R thigh LE numbness/tingling: no  LE weakness: no just in the back  Aggravating factors: sitting to long and then trying to stand up  Treatments tried: tylenol or aleve   Diagnostic imaging: L-spine MRI- 05/28/19 and 11/03/01  Past medical history, Surgical history, Family history, Social history, Allergies, and medications have been entered into the medical record, reviewed.   Review of Systems: No new headache, visual changes, nausea, vomiting, diarrhea, constipation, dizziness, abdominal pain, skin rash, fevers, chills, night sweats, weight loss, swollen lymph nodes, body aches, joint swelling, muscle aches, chest pain, shortness of breath, mood changes, visual or auditory hallucinations.   Objective:    Vitals:   06/03/19 1515  BP: 118/84  Pulse: 73  SpO2: 99%   General: Well Developed, well nourished, and in no acute distress.   MSK: L-spine: Normal-appearing Nontender midline. Decreased lumbar motion. Pain with extension. Lower extremity strength reflexes and sensation are intact distally.  Lab and Radiology Results DG Eye Foreign Body  Result Date: 05/28/2019 CLINICAL DATA:  Metal working/exposure; clearance prior to MRI EXAM: ORBITS FOR FOREIGN BODY - 2 VIEW  COMPARISON:  None. FINDINGS: There is no evidence of metallic foreign body within the orbits. No significant bone abnormality identified. IMPRESSION: No evidence of intraorbital metallic foreign body. Electronically Signed   By: Richardean Sale M.D.   On: 05/28/2019 16:50   MR Lumbar Spine Wo Contrast  Result Date: 05/29/2019 CLINICAL DATA:  Chronic low back pain radiating into the right buttock, hip, and leg. No prior surgery. EXAM: MRI LUMBAR SPINE WITHOUT CONTRAST TECHNIQUE: Multiplanar, multisequence MR imaging of the lumbar spine was performed. No intravenous contrast was administered. COMPARISON:  CT lumbar spine dated August 04, 2008. FINDINGS: Segmentation:  Standard. Alignment: Unchanged levoscoliosis. Unchanged 5 mm anterolisthesis at L4-L5. Vertebrae:  No fracture, evidence of discitis, or bone lesion. Conus medullaris and cauda equina: Conus extends to the T12-L1 level. Conus and cauda equina appear normal. Paraspinal and other soft tissues: Small left renal cysts. Otherwise negative. Disc levels: T12-L1:  Unchanged mild disc bulging. No stenosis. L1-L2: Unchanged mild disc bulging eccentric to the left. Mild left lateral recess stenosis. No spinal canal or neuroforaminal stenosis. L2-L3: Progressive disc height loss and mild diffuse disc bulging. New mild spinal canal and right neuroforaminal stenosis. No left neuroforaminal stenosis. L3-L4: Unchanged mild diffuse disc bulging. Unchanged mild bilateral lateral recess stenosis. Unchanged moderate right neuroforaminal stenosis. No spinal canal or left neuroforaminal stenosis. L4-L5: Unchanged mild disc bulging. Right subarticular and foraminal annular fissure with unchanged right foraminal disc osteophyte complex. Progressed advanced right facet arthropathy with new partial ankylosis. Unchanged mild left facet arthropathy. Unchanged mild right  neuroforaminal stenosis. No spinal canal or left neuroforaminal stenosis. L5-S1: Unchanged minimal disc bulging  and mild bilateral facet arthropathy. No stenosis. IMPRESSION: 1. Progressive degenerative changes of the lumbar spine as described above. New mild spinal canal and right neuroforaminal stenosis at L2-L3. 2. Unchanged moderate right neuroforaminal stenosis at L3-L4. Electronically Signed   By: Titus Dubin M.D.   On: 05/29/2019 11:50   I, Lynne Leader, personally (independently) visualized and performed the interpretation of the images attached in this note.   Impression and Recommendations:    Assessment and Plan: 69 y.o. male with right low back pain without radiation ongoing for several months.  Dominant finding to explain pain today is severe facet DJD at L4-L5.  He also has significant myofascial disruptions for dysfunction and spasm.  Discussed options and treatment plan.  Plan for trial of physical therapy and recheck in 4 weeks.  If not better would proceed with trial of facet injection.  Would have to stop Plavix for facet injection safety therefore would like to avoid interventional treatment if possible. Recheck 4 weeks.Marland Kitchen  PDMP not reviewed this encounter. Orders Placed This Encounter  Procedures  . Ambulatory referral to Physical Therapy    Referral Priority:   Routine    Referral Type:   Physical Medicine    Referral Reason:   Specialty Services Required    Requested Specialty:   Physical Therapy   No orders of the defined types were placed in this encounter.   Discussed warning signs or symptoms. Please see discharge instructions. Patient expresses understanding.   The above documentation has been reviewed and is accurate and complete Lynne Leader   Reviewed MRI findings treatment plan options and next steps.

## 2019-06-18 ENCOUNTER — Ambulatory Visit: Payer: BC Managed Care – PPO

## 2019-07-01 ENCOUNTER — Other Ambulatory Visit: Payer: BC Managed Care – PPO | Admitting: *Deleted

## 2019-07-01 ENCOUNTER — Other Ambulatory Visit: Payer: Self-pay

## 2019-07-01 DIAGNOSIS — E785 Hyperlipidemia, unspecified: Secondary | ICD-10-CM

## 2019-07-01 DIAGNOSIS — G720 Drug-induced myopathy: Secondary | ICD-10-CM

## 2019-07-01 LAB — HEPATIC FUNCTION PANEL
ALT: 14 IU/L (ref 0–44)
AST: 18 IU/L (ref 0–40)
Albumin: 4.1 g/dL (ref 3.8–4.8)
Alkaline Phosphatase: 92 IU/L (ref 48–121)
Bilirubin Total: 0.2 mg/dL (ref 0.0–1.2)
Bilirubin, Direct: 0.09 mg/dL (ref 0.00–0.40)
Total Protein: 6.7 g/dL (ref 6.0–8.5)

## 2019-07-01 LAB — LIPID PANEL
Chol/HDL Ratio: 2.8 ratio (ref 0.0–5.0)
Cholesterol, Total: 157 mg/dL (ref 100–199)
HDL: 57 mg/dL (ref >39)
LDL Chol Calc (NIH): 89 mg/dL (ref 0–99)
Triglycerides: 54 mg/dL (ref 0–149)
VLDL Cholesterol Cal: 11 mg/dL (ref 5–40)

## 2019-07-02 ENCOUNTER — Telehealth: Payer: Self-pay | Admitting: Pharmacist

## 2019-07-02 NOTE — Telephone Encounter (Signed)
Pt returned call to clinic. He does not wish to increase his rosuvastatin to 40mg  daily since he is noticing some fatigue. Denies muscle aches though and is willing to continue on current dose of rosuvastatin.  He still does not want to start PCSK9i therapy or add any other medications for his cholesterol. Will make note of this and encouraged pt to continue on rosuvastatin 20mg  daily.

## 2019-07-02 NOTE — Telephone Encounter (Addendum)
Left message to discuss lipid panel results.  LDL has improved from 143 to 89 since starting rosuvastatin 20mg  daily, however remains above goal < 55.   He has many lipid lowering medication intolerances including: Crestor 20mg  daily and 3 days per week and Crestor 10mg  every other day (had wished to rechallenge Crestor at his last visit), Livalo 2mg  3 days per week, pravastatin 40mg  daily and 20mg  3 days per week, Lipitor, Zetia 10mg  daily and every other day.  Will discuss increasing rosuvastatin dose to 40mg  daily and/or starting Praluent injections (he declined injections at his last visit).

## 2019-07-10 ENCOUNTER — Encounter: Payer: Self-pay | Admitting: Internal Medicine

## 2019-07-10 ENCOUNTER — Other Ambulatory Visit: Payer: Self-pay

## 2019-07-10 ENCOUNTER — Ambulatory Visit (INDEPENDENT_AMBULATORY_CARE_PROVIDER_SITE_OTHER): Payer: BC Managed Care – PPO | Admitting: Internal Medicine

## 2019-07-10 VITALS — BP 132/80 | HR 88 | Temp 98.7°F | Ht 74.0 in | Wt 197.0 lb

## 2019-07-10 DIAGNOSIS — H6122 Impacted cerumen, left ear: Secondary | ICD-10-CM | POA: Diagnosis not present

## 2019-07-10 NOTE — Progress Notes (Signed)
Subjective:  Patient ID: Eric Dickerson, male    DOB: Aug 19, 1950  Age: 69 y.o. MRN: [id number]  CC: Otalgia  This visit occurred during the SARS-CoV-2 public health emergency.  Safety protocols were in place, including screening questions prior to the visit, additional usage of staff PPE, and extensive cleaning of exam room while observing appropriate contact time as indicated for disinfecting solutions.    HPI Eric Dickerson presents for a 1 week hx of L decreased LOH.  Outpatient Medications Prior to Visit  Medication Sig Dispense Refill  . amLODipine (NORVASC) 10 MG tablet TAKE 1 TABLET BY MOUTH DAILY. MAKE APPT WITH MD 90 tablet 3  . clopidogrel (PLAVIX) 75 MG tablet Take 75 mg by mouth 2 (two) times a week.    . fluticasone (FLONASE) 50 MCG/ACT nasal spray Place 2 sprays into both nostrils daily. 16 g 6  . Multiple Vitamin (MULTIVITAMIN WITH MINERALS) TABS Take 1 tablet by mouth daily. Centrum silver    . nitroGLYCERIN (NITROSTAT) 0.4 MG SL tablet PLACE 1 TABLET UNDER THE TONGUE EVER Y 5 MINUTES AS NEEDED FOR CHEST PAIN (3 DOSES MAXIMUM) 25 tablet 0  . rosuvastatin (CRESTOR) 20 MG tablet Take 1 tablet (20 mg total) by mouth daily. 30 tablet 11  . tamsulosin (FLOMAX) 0.4 MG CAPS capsule Take 0.4 mg by mouth daily.     Facility-Administered Medications Prior to Visit  Medication Dose Route Frequency Provider Last Rate Last Admin  . 0.9 %  sodium chloride infusion  500 mL Intravenous Continuous Danis, Estill Cotta III, MD        ROS Review of Systems  HENT: Positive for hearing loss. Negative for ear discharge, ear pain, tinnitus and trouble swallowing.   Eyes: Negative.   Respiratory: Negative for cough and chest tightness.   Cardiovascular: Negative for chest pain, palpitations and leg swelling.  Gastrointestinal: Negative for abdominal pain, diarrhea and nausea.  Endocrine: Negative.   Genitourinary: Negative.   Musculoskeletal: Negative for gait problem.  Skin: Negative for color  change and rash.  Neurological: Negative for dizziness, light-headedness, numbness and headaches.  Hematological: Negative for adenopathy.  Psychiatric/Behavioral: Negative.     Objective:  BP 132/80 (BP Location: Left Arm, Patient Position: Sitting, Cuff Size: Large)   Pulse 88   Temp 98.7 F (37.1 C) (Oral)   Ht '6\' 2"'  (1.88 m)   Wt 197 lb (89.4 kg)   SpO2 97%   BMI 25.29 kg/m   BP Readings from Last 3 Encounters:  07/10/19 132/80  06/03/19 118/84  05/02/19 134/82    Wt Readings from Last 3 Encounters:  07/10/19 197 lb (89.4 kg)  06/03/19 200 lb (90.7 kg)  05/02/19 199 lb (90.3 kg)    Physical Exam Vitals reviewed.  Constitutional:      Appearance: Normal appearance.  HENT:     Right Ear: Hearing, tympanic membrane, ear canal and external ear normal.     Left Ear: Tympanic membrane and external ear normal. Decreased hearing noted. No swelling. There is impacted cerumen. No mastoid tenderness. Tympanic membrane is not injected.     Ears:     Comments: He gave verbal permission to undergo removal of the cerumen.  I put Colace in the left EAC then irrigated it with water and used an ear pick to remove the cerumen.  He tolerated the procedure well with no complications.  The examination afterwards shows that the external auditory canal is clear of cerumen and appears normal.  His hearing  has returned to normal.    Nose: Nose normal.     Mouth/Throat:     Mouth: Mucous membranes are moist.  Eyes:     General: No scleral icterus. Cardiovascular:     Rate and Rhythm: Normal rate and regular rhythm.     Heart sounds: No murmur.  Pulmonary:     Effort: Pulmonary effort is normal.     Breath sounds: No stridor. No wheezing, rhonchi or rales.  Abdominal:     General: Abdomen is flat. Bowel sounds are normal. There is no distension.     Palpations: Abdomen is soft. There is no hepatomegaly, splenomegaly or mass.  Musculoskeletal:        General: Normal range of motion.      Cervical back: Neck supple.     Right lower leg: No edema.     Left lower leg: No edema.  Lymphadenopathy:     Cervical: No cervical adenopathy.  Skin:    General: Skin is warm and dry.  Neurological:     General: No focal deficit present.     Mental Status: He is alert and oriented to person, place, and time. Mental status is at baseline.     Lab Results  Component Value Date   WBC 5.3 04/22/2019   HGB 13.3 04/22/2019   HCT 40.0 04/22/2019   PLT 235.0 04/22/2019   GLUCOSE 89 04/22/2019   CHOL 157 07/01/2019   TRIG 54 07/01/2019   HDL 57 07/01/2019   LDLDIRECT 130.4 11/17/2009   LDLCALC 89 07/01/2019   ALT 14 07/01/2019   AST 18 07/01/2019   NA 140 04/22/2019   K 4.0 04/22/2019   CL 104 04/22/2019   CREATININE 0.96 04/22/2019   BUN 16 04/22/2019   CO2 27 04/22/2019   TSH 2.15 04/22/2019   PSA 4.36 (H) 04/22/2019   INR 1.28 04/22/2012   HGBA1C 5.9 04/22/2019    DG Eye Foreign Body  Result Date: 05/28/2019 CLINICAL DATA:  Metal working/exposure; clearance prior to MRI EXAM: ORBITS FOR FOREIGN BODY - 2 VIEW COMPARISON:  None. FINDINGS: There is no evidence of metallic foreign body within the orbits. No significant bone abnormality identified. IMPRESSION: No evidence of intraorbital metallic foreign body. Electronically Signed   By: Richardean Sale M.D.   On: 05/28/2019 16:50   MR Lumbar Spine Wo Contrast  Result Date: 05/29/2019 CLINICAL DATA:  Chronic low back pain radiating into the right buttock, hip, and leg. No prior surgery. EXAM: MRI LUMBAR SPINE WITHOUT CONTRAST TECHNIQUE: Multiplanar, multisequence MR imaging of the lumbar spine was performed. No intravenous contrast was administered. COMPARISON:  CT lumbar spine dated August 04, 2008. FINDINGS: Segmentation:  Standard. Alignment: Unchanged levoscoliosis. Unchanged 5 mm anterolisthesis at L4-L5. Vertebrae:  No fracture, evidence of discitis, or bone lesion. Conus medullaris and cauda equina: Conus extends to the  T12-L1 level. Conus and cauda equina appear normal. Paraspinal and other soft tissues: Small left renal cysts. Otherwise negative. Disc levels: T12-L1:  Unchanged mild disc bulging. No stenosis. L1-L2: Unchanged mild disc bulging eccentric to the left. Mild left lateral recess stenosis. No spinal canal or neuroforaminal stenosis. L2-L3: Progressive disc height loss and mild diffuse disc bulging. New mild spinal canal and right neuroforaminal stenosis. No left neuroforaminal stenosis. L3-L4: Unchanged mild diffuse disc bulging. Unchanged mild bilateral lateral recess stenosis. Unchanged moderate right neuroforaminal stenosis. No spinal canal or left neuroforaminal stenosis. L4-L5: Unchanged mild disc bulging. Right subarticular and foraminal annular fissure with unchanged right foraminal  disc osteophyte complex. Progressed advanced right facet arthropathy with new partial ankylosis. Unchanged mild left facet arthropathy. Unchanged mild right neuroforaminal stenosis. No spinal canal or left neuroforaminal stenosis. L5-S1: Unchanged minimal disc bulging and mild bilateral facet arthropathy. No stenosis. IMPRESSION: 1. Progressive degenerative changes of the lumbar spine as described above. New mild spinal canal and right neuroforaminal stenosis at L2-L3. 2. Unchanged moderate right neuroforaminal stenosis at L3-L4. Electronically Signed   By: Titus Dubin M.D.   On: 05/29/2019 11:50    Assessment & Plan:   My was seen today for otalgia.  Diagnoses and all orders for this visit:  Hearing loss due to cerumen impaction, left- The cerumen has been successfully removed.  His hearing has returned to normal.  The examination afterwards is normal.   I am having Conni Elliot maintain his multivitamin with minerals, tamsulosin, nitroGLYCERIN, fluticasone, amLODipine, clopidogrel, and rosuvastatin. We will continue to administer sodium chloride.  No orders of the defined types were placed in this  encounter.    Follow-up: No follow-ups on file.  Scarlette Calico, MD

## 2019-10-23 ENCOUNTER — Ambulatory Visit: Payer: BC Managed Care – PPO | Admitting: Internal Medicine

## 2019-10-25 ENCOUNTER — Other Ambulatory Visit: Payer: Self-pay | Admitting: Cardiovascular Disease

## 2019-11-06 ENCOUNTER — Other Ambulatory Visit: Payer: Self-pay

## 2019-11-06 ENCOUNTER — Ambulatory Visit (INDEPENDENT_AMBULATORY_CARE_PROVIDER_SITE_OTHER): Payer: BC Managed Care – PPO | Admitting: Internal Medicine

## 2019-11-06 ENCOUNTER — Encounter: Payer: Self-pay | Admitting: Internal Medicine

## 2019-11-06 VITALS — BP 120/80 | HR 78 | Temp 99.4°F | Ht 74.0 in | Wt 193.0 lb

## 2019-11-06 DIAGNOSIS — I10 Essential (primary) hypertension: Secondary | ICD-10-CM

## 2019-11-06 DIAGNOSIS — R7309 Other abnormal glucose: Secondary | ICD-10-CM

## 2019-11-06 DIAGNOSIS — E785 Hyperlipidemia, unspecified: Secondary | ICD-10-CM | POA: Diagnosis not present

## 2019-11-06 DIAGNOSIS — T466X5A Adverse effect of antihyperlipidemic and antiarteriosclerotic drugs, initial encounter: Secondary | ICD-10-CM

## 2019-11-06 DIAGNOSIS — N401 Enlarged prostate with lower urinary tract symptoms: Secondary | ICD-10-CM | POA: Diagnosis not present

## 2019-11-06 DIAGNOSIS — G720 Drug-induced myopathy: Secondary | ICD-10-CM

## 2019-11-06 NOTE — Patient Instructions (Signed)
Please continue all other medications as before, and refills have been done if requested.  Please have the pharmacy call with any other refills you may need.  Please continue your efforts at being more active, low cholesterol diet, and weight control.  Please keep your appointments with your specialists as you may have planned  Please make an Appointment to return in 6 months, or sooner if needed 

## 2019-11-06 NOTE — Progress Notes (Signed)
Subjective:    Patient ID: Eric Dickerson, male    DOB: 09/13/1950, 69 y.o.   MRN: [id number]  HPI  Here to f/u; overall doing ok,  Pt denies chest pain, increasing sob or doe, wheezing, orthopnea, PND, increased LE swelling, palpitations, dizziness or syncope.  Pt denies new neurological symptoms such as new headache, or facial or extremity weakness or numbness.  Pt denies polydipsia, polyuria, or low sugar episode.  Pt states overall good compliance with meds, mostly trying to follow appropriate diet, with wt overall stable,  but little exercise however. No longer taking the crestor due to cramping.  Is being considered for prostate biopsy but he hoping to avoid for now. Denies urinary symptoms such as dysuria, frequency, urgency, flank pain, hematuria or n/v, fever, chills.  Tolerating the flomax.  No other new complaints Past Medical History:  Diagnosis Date  . ANEMIA, MILD   . CAD (coronary artery disease)    a. CAD,  b. s/p Cypher DES to the ramus and Promus DES x 2 to the RCA 08/2007 (minimal disease in LAD and CFX at that time), c. Myoview 8/10: EF 63%, inf thinning, small prior ant infarct, no ischemia;  d. s/p CABG 3/14 (L-LAD, RIMA-OM1)  . DEGENERATIVE JOINT DISEASE, RIGHT HIP   . DIVERTICULOSIS, COLON   . ERECTILE DYSFUNCTION   . GERD   . Heart murmur   . HIATAL HERNIA   . HTN (hypertension)   . Hyperlipidemia    statin intolerant  . HYPERPLASIA PROSTATE UNS W/O UR OBST & OTH LUTS   . LUMBAR RADICULOPATHY, RIGHT   . Other abnormal glucose   . PAD (peripheral artery disease) ([place])    pre CABG 04/2012 => ABIs: Right 0.92, left 0.61 // ABI 09/02/14: R 0.93; L 0.82  . SPINAL STENOSIS, LUMBAR    Past Surgical History:  Procedure Laterality Date  . COLONOSCOPY  2002   negative  . CORONARY ARTERY BYPASS GRAFT N/A 04/22/2012   Procedure: CORONARY ARTERY BYPASS GRAFTING (CABG);  Surgeon: [name], MD;  Location: [facility];  Service: Open Heart Surgery;  Laterality: N/A;  CABG X  2, BIMA, POSSIBLE EVH  . CORONARY STENT PLACEMENT  2010    X 2  . TEE WITHOUT CARDIOVERSION N/A 04/22/2012   Procedure: TRANSESOPHAGEAL ECHOCARDIOGRAM (TEE);  Surgeon: [name], MD;  Location: [facility];  Service: Open Heart Surgery;  Laterality: N/A;  . TOTAL HIP ARTHROPLASTY  2009    reports that he quit smoking about 34 years ago. He has a 18.00 pack-year smoking history. He has never used smokeless tobacco. He reports that he does not drink alcohol and does not use drugs. family history includes Diabetes in his maternal grandmother; Heart attack (age of onset: 66) in his father; Heart attack (age of onset: 44) in his brother; Hyperlipidemia in his brother and mother; Hypertension in his father; Pulmonary embolism in his brother; Stroke in his brother. Allergies  Allergen Reactions  . Olmesartan Medoxomil Rash  . Quinapril Hcl Other (See Comments)     cough; ACE inhibitors are contraindicated because of a history of rash with Angiotensin receptor blocker.  . Glucosamine Other (See Comments)    Doesn't remember reaction  . Livalo [Pitavastatin]     cramps  . Rosuvastatin Other (See Comments)    : muscle cramps  . Verapamil Other (See Comments)    Extra sensitive to sun   Current Outpatient Medications on File Prior to Visit  Medication Sig  Dispense Refill  . amLODipine (NORVASC) 10 MG tablet TAKE 1 TABLET BY MOUTH EVERY DAY 90 tablet 3  . clopidogrel (PLAVIX) 75 MG tablet Take 75 mg by mouth 2 (two) times a week.    . fluticasone (FLONASE) 50 MCG/ACT nasal spray Place 2 sprays into both nostrils daily. 16 g 6  . Multiple Vitamin (MULTIVITAMIN WITH MINERALS) TABS Take 1 tablet by mouth daily. Centrum silver    . nitroGLYCERIN (NITROSTAT) 0.4 MG SL tablet PLACE 1 TABLET UNDER THE TONGUE EVER Y 5 MINUTES AS NEEDED FOR CHEST PAIN (3 DOSES MAXIMUM) 25 tablet 0  . rosuvastatin (CRESTOR) 20 MG tablet Take 1 tablet (20 mg total) by mouth daily. 30 tablet 11  . tamsulosin (FLOMAX) 0.4  MG CAPS capsule Take 0.4 mg by mouth daily.     Current Facility-Administered Medications on File Prior to Visit  Medication Dose Route Frequency Provider Last Rate Last Admin  . 0.9 %  sodium chloride infusion  500 mL Intravenous Continuous Danis, Estill Cotta III, MD       Review of Systems All otherwise neg per pt     Objective:   Physical Exam BP 120/80 (BP Location: Left Arm, Patient Position: Sitting, Cuff Size: Large)   Pulse 78   Temp 99.4 F (37.4 C) (Oral)   Ht 6\' 2"  (1.88 m)   Wt 193 lb (87.5 kg)   SpO2 97%   BMI 24.78 kg/m  VS noted,  Constitutional: Pt appears in NAD HENT: Head: NCAT.  Right Ear: External ear normal.  Left Ear: External ear normal.  Eyes: . Pupils are equal, round, and reactive to light. Conjunctivae and EOM are normal Nose: without d/c or deformity Neck: Neck supple. Gross normal ROM Cardiovascular: Normal rate and regular rhythm.   Pulmonary/Chest: Effort normal and breath sounds without rales or wheezing.  Abd:  Soft, NT, ND, + BS, no organomegaly Neurological: Pt is alert. At baseline orientation, motor grossly intact Skin: Skin is warm. No rashes, other new lesions, no LE edema Psychiatric: Pt behavior is normal without agitation  All otherwise neg per pt Lab Results  Component Value Date   WBC 5.3 04/22/2019   HGB 13.3 04/22/2019   HCT 40.0 04/22/2019   PLT 235.0 04/22/2019   GLUCOSE 89 04/22/2019   CHOL 157 07/01/2019   TRIG 54 07/01/2019   HDL 57 07/01/2019   LDLDIRECT 130.4 11/17/2009   LDLCALC 89 07/01/2019   ALT 14 07/01/2019   AST 18 07/01/2019   NA 140 04/22/2019   K 4.0 04/22/2019   CL 104 04/22/2019   CREATININE 0.96 04/22/2019   BUN 16 04/22/2019   CO2 27 04/22/2019   TSH 2.15 04/22/2019   PSA 4.36 (H) 04/22/2019   INR 1.28 04/22/2012   HGBA1C 5.9 04/22/2019      Assessment & Plan:

## 2019-11-09 ENCOUNTER — Encounter: Payer: Self-pay | Admitting: Internal Medicine

## 2019-11-09 NOTE — Assessment & Plan Note (Addendum)

## 2019-11-09 NOTE — Assessment & Plan Note (Addendum)
Unable to tolerate the crestor, o/w stable overall by history and exam, recent data reviewed with pt, and pt to continue medical treatment as before,  to f/u any worsening symptoms or concerns

## 2019-11-09 NOTE — Assessment & Plan Note (Signed)
Improved, stable overall by history and exam, recent data reviewed with pt, and pt to continue medical treatment as before,  to f/u any worsening symptoms or concerns  

## 2019-11-09 NOTE — Assessment & Plan Note (Signed)
stable overall by history and exam, recent data reviewed with pt, and pt to continue medical treatment as before,  to f/u any worsening symptoms or concerns  

## 2019-11-09 NOTE — Assessment & Plan Note (Signed)
Unable to tolerate the crestor, cont to follow

## 2020-04-27 ENCOUNTER — Other Ambulatory Visit: Payer: Self-pay

## 2020-04-27 ENCOUNTER — Ambulatory Visit (INDEPENDENT_AMBULATORY_CARE_PROVIDER_SITE_OTHER): Payer: Medicare PPO | Admitting: Internal Medicine

## 2020-04-27 ENCOUNTER — Encounter: Payer: Self-pay | Admitting: Internal Medicine

## 2020-04-27 VITALS — BP 130/76 | HR 87 | Temp 99.1°F | Ht 74.0 in | Wt 193.0 lb

## 2020-04-27 DIAGNOSIS — E538 Deficiency of other specified B group vitamins: Secondary | ICD-10-CM | POA: Diagnosis not present

## 2020-04-27 DIAGNOSIS — R7309 Other abnormal glucose: Secondary | ICD-10-CM

## 2020-04-27 DIAGNOSIS — N3281 Overactive bladder: Secondary | ICD-10-CM | POA: Diagnosis not present

## 2020-04-27 DIAGNOSIS — G720 Drug-induced myopathy: Secondary | ICD-10-CM

## 2020-04-27 DIAGNOSIS — R972 Elevated prostate specific antigen [PSA]: Secondary | ICD-10-CM | POA: Diagnosis not present

## 2020-04-27 DIAGNOSIS — E559 Vitamin D deficiency, unspecified: Secondary | ICD-10-CM

## 2020-04-27 DIAGNOSIS — E7800 Pure hypercholesterolemia, unspecified: Secondary | ICD-10-CM

## 2020-04-27 DIAGNOSIS — Z0001 Encounter for general adult medical examination with abnormal findings: Secondary | ICD-10-CM

## 2020-04-27 DIAGNOSIS — I10 Essential (primary) hypertension: Secondary | ICD-10-CM | POA: Diagnosis not present

## 2020-04-27 DIAGNOSIS — T466X5A Adverse effect of antihyperlipidemic and antiarteriosclerotic drugs, initial encounter: Secondary | ICD-10-CM

## 2020-04-27 LAB — BASIC METABOLIC PANEL
BUN: 16 mg/dL (ref 6–23)
CO2: 28 mEq/L (ref 19–32)
Calcium: 9.5 mg/dL (ref 8.4–10.5)
Chloride: 104 mEq/L (ref 96–112)
Creatinine, Ser: 1.01 mg/dL (ref 0.40–1.50)
GFR: 75.24 mL/min (ref >60.00)
Glucose, Bld: 81 mg/dL (ref 70–99)
Potassium: 3.7 mEq/L (ref 3.5–5.1)
Sodium: 139 mEq/L (ref 135–145)

## 2020-04-27 LAB — CBC WITH DIFFERENTIAL/PLATELET
Basophils Absolute: 0.1 10*3/uL (ref 0.0–0.1)
Basophils Relative: 0.9 % (ref 0.0–3.0)
Eosinophils Absolute: 0.1 10*3/uL (ref 0.0–0.7)
Eosinophils Relative: 1.3 % (ref 0.0–5.0)
HCT: 39.4 % (ref 39.0–52.0)
Hemoglobin: 13 g/dL (ref 13.0–17.0)
Lymphocytes Relative: 26.5 % (ref 12.0–46.0)
Lymphs Abs: 1.7 10*3/uL (ref 0.7–4.0)
MCHC: 32.9 g/dL (ref 30.0–36.0)
MCV: 86.1 fl (ref 78.0–100.0)
Monocytes Absolute: 0.6 10*3/uL (ref 0.1–1.0)
Monocytes Relative: 9 % (ref 3.0–12.0)
Neutro Abs: 4.1 10*3/uL (ref 1.4–7.7)
Neutrophils Relative %: 62.3 % (ref 43.0–77.0)
Platelets: 235 10*3/uL (ref 150.0–400.0)
RBC: 4.58 Mil/uL (ref 4.22–5.81)
RDW: 14.6 % (ref 11.5–15.5)
WBC: 6.6 10*3/uL (ref 4.0–10.5)

## 2020-04-27 LAB — HEPATIC FUNCTION PANEL
ALT: 16 U/L (ref 0–53)
AST: 18 U/L (ref 0–37)
Albumin: 4.2 g/dL (ref 3.5–5.2)
Alkaline Phosphatase: 89 U/L (ref 39–117)
Bilirubin, Direct: 0.1 mg/dL (ref 0.0–0.3)
Total Bilirubin: 0.4 mg/dL (ref 0.2–1.2)
Total Protein: 7.1 g/dL (ref 6.0–8.3)

## 2020-04-27 LAB — PSA: PSA: 6.26 ng/mL — ABNORMAL HIGH (ref 0.10–4.00)

## 2020-04-27 LAB — LIPID PANEL
Cholesterol: 231 mg/dL — ABNORMAL HIGH (ref 0–200)
HDL: 53.9 mg/dL (ref >39.00)
LDL Cholesterol: 165 mg/dL — ABNORMAL HIGH (ref 0–99)
NonHDL: 177.23
Total CHOL/HDL Ratio: 4
Triglycerides: 63 mg/dL (ref 0.0–149.0)
VLDL: 12.6 mg/dL (ref 0.0–40.0)

## 2020-04-27 LAB — HEMOGLOBIN A1C: Hgb A1c MFr Bld: 6 % (ref 4.6–6.5)

## 2020-04-27 LAB — VITAMIN D 25 HYDROXY (VIT D DEFICIENCY, FRACTURES): VITD: 45.42 ng/mL (ref 30.00–100.00)

## 2020-04-27 LAB — TSH: TSH: 2.43 u[IU]/mL (ref 0.35–4.50)

## 2020-04-27 LAB — VITAMIN B12: Vitamin B-12: 662 pg/mL (ref 211–911)

## 2020-04-27 MED ORDER — SOLIFENACIN SUCCINATE 5 MG PO TABS: 5.0000 mg | ORAL_TABLET | Freq: Every day | ORAL | 3 refills | Status: DC

## 2020-04-27 NOTE — Progress Notes (Signed)
Patient ID: Eric Dickerson, male   DOB: [birth year], 70 y.o.   MRN: [id number]         Chief Complaint:: wellness exam and urinary frequency       HPI:  ZALYN AMEND is a 70 y.o. male here for wellness exam; declines flu and tdap and pneumovax shots, o/w up to date with preventive referrals and immunizations.                          Also c/o > 3 mo ongoing urinary frequency up to 6 to 7 times per day and 1-3 times at night but Denies urinary symptoms such as dysuria, urgency, flank pain, hematuria or n/v, fever, chills.  Has seen urology and has benign prostate per pt, flomax stopped.  Pt denies chest pain, increased sob or doe, wheezing, orthopnea, PND, increased LE swelling, palpitations, dizziness or syncope.   Pt denies polydipsia, polyuria, Good med compliance.  Denies worsening focal neuro s/s.   Pt denies fever, wt loss, night sweats, loss of appetite, or other constitutional symptoms  No other new complaints. Wt Readings from Last 3 Encounters:  04/27/20 193 lb (87.5 kg)  11/06/19 193 lb (87.5 kg)  07/10/19 197 lb (89.4 kg)   BP Readings from Last 3 Encounters:  04/27/20 130/76  11/06/19 120/80  07/10/19 132/80   Immunization History  Administered Date(s) Administered  . PFIZER(Purple Top)SARS-COV-2 Vaccination 03/30/2019, 04/22/2019  . Td 11/15/2009   There are no preventive care reminders to display for this patient.    Past Medical History:  Diagnosis Date  . ANEMIA, MILD   . CAD (coronary artery disease)    a. CAD,  b. s/p Cypher DES to the ramus and Promus DES x 2 to the RCA 08/2007 (minimal disease in LAD and CFX at that time), c. Myoview 8/10: EF 63%, inf thinning, small prior ant infarct, no ischemia;  d. s/p CABG 3/14 (L-LAD, RIMA-OM1)  . DEGENERATIVE JOINT DISEASE, RIGHT HIP   . DIVERTICULOSIS, COLON   . ERECTILE DYSFUNCTION   . GERD   . Heart murmur   . HIATAL HERNIA   . HTN (hypertension)   . Hyperlipidemia    statin intolerant  . HYPERPLASIA PROSTATE UNS W/O  UR OBST & OTH LUTS   . LUMBAR RADICULOPATHY, RIGHT   . Other abnormal glucose   . PAD (peripheral artery disease) ([place])    pre CABG 04/2012 => ABIs: Right 0.92, left 0.61 // ABI 09/02/14: R 0.93; L 0.82  . SPINAL STENOSIS, LUMBAR    Past Surgical History:  Procedure Laterality Date  . COLONOSCOPY  2002   negative  . CORONARY ARTERY BYPASS GRAFT N/A 04/22/2012   Procedure: CORONARY ARTERY BYPASS GRAFTING (CABG);  Surgeon: [name], MD;  Location: [place];  Service: Open Heart Surgery;  Laterality: N/A;  CABG X 2, BIMA, POSSIBLE EVH  . CORONARY STENT PLACEMENT  2010    X 2  . TEE WITHOUT CARDIOVERSION N/A 04/22/2012   Procedure: TRANSESOPHAGEAL ECHOCARDIOGRAM (TEE);  Surgeon: [name], MD;  Location: [place];  Service: Open Heart Surgery;  Laterality: N/A;  . TOTAL HIP ARTHROPLASTY  2009    reports that he quit smoking about 35 years ago. He has a 18.00 pack-year smoking history. He has never used smokeless tobacco. He reports that he does not drink alcohol and does not use drugs. family history includes Diabetes in his maternal grandmother; Heart attack (age of onset:  16) in his father; Heart attack (age of onset: 43) in his brother; Hyperlipidemia in his brother and mother; Hypertension in his father; Pulmonary embolism in his brother; Stroke in his brother. Allergies  Allergen Reactions  . Olmesartan Medoxomil Rash  . Quinapril Hcl Other (See Comments)     cough; ACE inhibitors are contraindicated because of a history of rash with Angiotensin receptor blocker.  . Glucosamine Other (See Comments)    Doesn't remember reaction  . Livalo [Pitavastatin]     cramps  . Rosuvastatin Other (See Comments)    : muscle cramps  . Verapamil Other (See Comments)    Extra sensitive to sun   Current Outpatient Medications on File Prior to Visit  Medication Sig Dispense Refill  . clopidogrel (PLAVIX) 75 MG tablet Take 75 mg by mouth 2 (two) times a week.    . fluticasone  (FLONASE) 50 MCG/ACT nasal spray Place 2 sprays into both nostrils daily. 16 g 6  . Multiple Vitamin (MULTIVITAMIN WITH MINERALS) TABS Take 1 tablet by mouth daily. Centrum silver    . nitroGLYCERIN (NITROSTAT) 0.4 MG SL tablet PLACE 1 TABLET UNDER THE TONGUE EVER Y 5 MINUTES AS NEEDED FOR CHEST PAIN (3 DOSES MAXIMUM) 25 tablet 0  . tamsulosin (FLOMAX) 0.4 MG CAPS capsule Take 0.4 mg by mouth daily.    Marland Kitchen amLODipine (NORVASC) 10 MG tablet TAKE 1 TABLET BY MOUTH EVERY DAY (Patient not taking: Reported on 04/27/2020) 90 tablet 3   No current facility-administered medications on file prior to visit.        ROS:  All others reviewed and negative.  Objective        PE:  BP 130/76   Pulse 87   Temp 99.1 F (37.3 C) (Oral)   Ht 6\' 2"  (1.88 m)   Wt 193 lb (87.5 kg)   SpO2 98%   BMI 24.78 kg/m                 Constitutional: Pt appears in NAD               HENT: Head: NCAT.                Right Ear: External ear normal.                 Left Ear: External ear normal.                Eyes: . Pupils are equal, round, and reactive to light. Conjunctivae and EOM are normal               Nose: without d/c or deformity               Neck: Neck supple. Gross normal ROM               Cardiovascular: Normal rate and regular rhythm.                 Pulmonary/Chest: Effort normal and breath sounds without rales or wheezing.                Abd:  Soft, NT, ND, + BS, no organomegaly, no flank tender               Neurological: Pt is alert. At baseline orientation, motor grossly intact               Skin: Skin is warm. No rashes, no other new lesions, LE edema - none  Psychiatric: Pt behavior is normal without agitation   Micro: none  Cardiac tracings I have personally interpreted today:  none  Pertinent Radiological findings (summarize): none   Lab Results  Component Value Date   WBC 6.6 04/27/2020   HGB 13.0 04/27/2020   HCT 39.4 04/27/2020   PLT 235.0 04/27/2020   GLUCOSE 81  04/27/2020   CHOL 231 (H) 04/27/2020   TRIG 63.0 04/27/2020   HDL 53.90 04/27/2020   LDLDIRECT 130.4 11/17/2009   LDLCALC 165 (H) 04/27/2020   ALT 16 04/27/2020   AST 18 04/27/2020   NA 139 04/27/2020   K 3.7 04/27/2020   CL 104 04/27/2020   CREATININE 1.01 04/27/2020   BUN 16 04/27/2020   CO2 28 04/27/2020   TSH 2.43 04/27/2020   PSA 6.26 (H) 04/27/2020   INR 1.28 04/22/2012   HGBA1C 6.0 04/27/2020   Assessment/Plan:  RACHID PARHAM is a 70 y.o. Black or African American [2] male with  has a past medical history of ANEMIA, MILD, CAD (coronary artery disease), DEGENERATIVE JOINT DISEASE, RIGHT HIP, DIVERTICULOSIS, COLON, ERECTILE DYSFUNCTION, GERD, Heart murmur, HIATAL HERNIA, HTN (hypertension), Hyperlipidemia, HYPERPLASIA PROSTATE UNS W/O UR OBST & OTH LUTS, LUMBAR RADICULOPATHY, RIGHT, Other abnormal glucose, PAD (peripheral artery disease) ([place]), and SPINAL STENOSIS, LUMBAR.  Encounter for well adult exam with abnormal findings Age and sex appropriate education and counseling updated with regular exercise and diet Referrals for preventative services - none needed Immunizations addressed - declines flu, tdap, pneumovax Smoking counseling  - none needed Evidence for depression or other mood disorder - none significant Most recent labs reviewed. I have personally reviewed and have noted: 1) the patient's medical and social history 2) The patient's current medications and supplements 3) The patient's height, weight, and BMI have been recorded in the chart   Statin myopathy Has been unable to tolarate, declines to try further statins  Other abnormal glucose Lab Results  Component Value Date   HGBA1C 6.0 04/27/2020   Stable, pt to continue current medical treatment  - diet and wt control   Hyperlipidemia Lab Results  Component Value Date   LDLCALC 165 (H) 04/27/2020   Stable, pt to continue current low chol diet   HTN (hypertension) BP Readings from Last 3  Encounters:  04/27/20 130/76  11/06/19 120/80  07/10/19 132/80   Stable, pt to continue medical treatment norvasc   Elevated PSA Asympt, for f/u psa  OAB (overactive bladder) With worsening frequency, for vesicar 5 qd,  to f/u any worsening symptoms or concerns  Followup: Return in about 1 year (around 04/27/2021).  Cathlean Cower, MD 05/03/2020 4:51 AM [facility]

## 2020-04-27 NOTE — Patient Instructions (Signed)
Please take all new medication as prescribed - the vesicare 5 mg per day  Please continue all other medications as before, and refills have been done if requested.  Please have the pharmacy call with any other refills you may need.  Please continue your efforts at being more active, low cholesterol diet, and weight control.  You are otherwise up to date with prevention measures today.  Please keep your appointments with your specialists as you may have planned  Please go to the LAB at the blood drawing area for the tests to be done  You will be contacted by phone if any changes need to be made immediately.  Otherwise, you will receive a letter about your results with an explanation, but please check with MyChart first.  Please remember to sign up for MyChart if you have not done so, as this will be important to you in the future with finding out test results, communicating by private email, and scheduling acute appointments online when needed.  Please make an Appointment to return for your 1 year visit, or sooner if needed, with Lab testing by Appointment as well, to be done about 3-5 days before at the [facility] (so this is for TWO appointments - please see the scheduling desk as you leave)  Due to the ongoing Covid 19 pandemic, our lab now requires an appointment for any labs done at our office.  If you need labs done and do not have an appointment, please call our office ahead of time to schedule before presenting to the lab for your testing.

## 2020-04-28 ENCOUNTER — Other Ambulatory Visit: Payer: Self-pay | Admitting: Internal Medicine

## 2020-04-28 LAB — URINALYSIS, ROUTINE W REFLEX MICROSCOPIC
Bilirubin Urine: NEGATIVE
Hgb urine dipstick: NEGATIVE
Ketones, ur: NEGATIVE
Leukocytes,Ua: NEGATIVE
Nitrite: NEGATIVE
RBC / HPF: NONE SEEN (ref 0–?)
Specific Gravity, Urine: 1.02 (ref 1.000–1.030)
Total Protein, Urine: NEGATIVE
Urine Glucose: NEGATIVE
Urobilinogen, UA: 0.2 (ref 0.0–1.0)
WBC, UA: NONE SEEN (ref 0–?)
pH: 6.5 (ref 5.0–8.0)

## 2020-04-28 MED ORDER — ROSUVASTATIN CALCIUM 20 MG PO TABS: 20.0000 mg | ORAL_TABLET | Freq: Every day | ORAL | 3 refills | Status: DC

## 2020-05-03 ENCOUNTER — Encounter: Payer: Self-pay | Admitting: Internal Medicine

## 2020-05-03 DIAGNOSIS — N3281 Overactive bladder: Secondary | ICD-10-CM | POA: Insufficient documentation

## 2020-05-03 NOTE — Assessment & Plan Note (Signed)
BP Readings from Last 3 Encounters:  04/27/20 130/76  11/06/19 120/80  07/10/19 132/80   Stable, pt to continue medical treatment norvasc

## 2020-05-03 NOTE — Assessment & Plan Note (Signed)
With worsening frequency, for vesicar 5 qd,  to f/u any worsening symptoms or concerns

## 2020-05-03 NOTE — Assessment & Plan Note (Signed)
Has been unable to tolarate, declines to try further statins

## 2020-05-03 NOTE — Assessment & Plan Note (Signed)
Lab Results  Component Value Date   LDLCALC 165 (H) 04/27/2020   Stable, pt to continue current low chol diet

## 2020-05-03 NOTE — Assessment & Plan Note (Signed)
Asympt, for f/u psa 

## 2020-05-03 NOTE — Assessment & Plan Note (Signed)
Age and sex appropriate education and counseling updated with regular exercise and diet Referrals for preventative services - none needed Immunizations addressed - declines flu, tdap, pneumovax Smoking counseling  - none needed Evidence for depression or other mood disorder - none significant Most recent labs reviewed. I have personally reviewed and have noted: 1) the patient's medical and social history 2) The patient's current medications and supplements 3) The patient's height, weight, and BMI have been recorded in the chart

## 2020-05-03 NOTE — Assessment & Plan Note (Signed)
Lab Results  Component Value Date   HGBA1C 6.0 04/27/2020   Stable, pt to continue current medical treatment  - diet and wt control

## 2020-08-31 ENCOUNTER — Other Ambulatory Visit: Payer: Self-pay

## 2020-08-31 ENCOUNTER — Ambulatory Visit: Payer: Medicare PPO | Admitting: Internal Medicine

## 2020-08-31 VITALS — BP 152/88 | HR 81 | Temp 98.9°F | Ht 74.0 in | Wt 189.0 lb

## 2020-08-31 DIAGNOSIS — J309 Allergic rhinitis, unspecified: Secondary | ICD-10-CM

## 2020-08-31 DIAGNOSIS — R42 Dizziness and giddiness: Secondary | ICD-10-CM | POA: Diagnosis not present

## 2020-08-31 DIAGNOSIS — I10 Essential (primary) hypertension: Secondary | ICD-10-CM | POA: Diagnosis not present

## 2020-08-31 DIAGNOSIS — H6123 Impacted cerumen, bilateral: Secondary | ICD-10-CM | POA: Diagnosis not present

## 2020-08-31 DIAGNOSIS — N401 Enlarged prostate with lower urinary tract symptoms: Secondary | ICD-10-CM | POA: Diagnosis not present

## 2020-08-31 MED ORDER — MECLIZINE HCL 12.5 MG PO TABS: 12.5000 mg | ORAL_TABLET | Freq: Three times a day (TID) | ORAL | 1 refills | Status: AC | PRN

## 2020-08-31 MED ORDER — TRIAMCINOLONE ACETONIDE 55 MCG/ACT NA AERO: 2.0000 | INHALATION_SPRAY | Freq: Every day | NASAL | 12 refills | Status: DC

## 2020-08-31 NOTE — Patient Instructions (Signed)
Please take all new medication as prescribed - the meclizine as needed for vertigo (dizziness)  Please continue all other medications as before, including to restart the amlodipine and tamsulosin  Please take all new medication as prescribed - the nasacort for the allergies  I think you can hold on the follow up Urology visit for the tamsulosin  Your ears were cleared of wax by irrigation today  Please have the pharmacy call with any other refills you may need.  Please continue your efforts at being more active, low cholesterol diet, and weight control.  Please keep your appointments with your specialists as you may have planned

## 2020-08-31 NOTE — Progress Notes (Signed)
Chief Complaint: follow up HTN, bph, bilateral hearing loss, allergies, and veritgo       HPI:  Eric Dickerson is a 70 y.o. male here with c/o bilateral hearing loss for about 1 wk without HA, fever, pain, or d/c but has ear fullness also associated with worsening nasal allergy symptoms and vertigo - Does have several wks ongoing nasal allergy symptoms with clearish congestion, itch and sneezing, without fever, pain, ST, cough, swelling or wheezing.  Also ran out of flomax and has moderately worsening urinary slowing and mild retention, asking for flomax restart,  Denies urinary symptoms such as dysuria, frequency, urgency, flank pain, hematuria or n/v, fever, chills.  Also ran out of amlodipine 10 mg for 2 wks, BP elevated today, usually < 140/90 at home, asks for restart. Pt denies chest pain, increased sob or doe, wheezing, orthopnea, PND, increased LE swelling, palpitations, or syncope.   Pt denies polydipsia, polyuria, or new focal neuro s/s.    Wt Readings from Last 3 Encounters:  08/31/20 189 lb (85.7 kg)  04/27/20 193 lb (87.5 kg)  11/06/19 193 lb (87.5 kg)   BP Readings from Last 3 Encounters:  08/31/20 (!) 152/88  04/27/20 130/76  11/06/19 120/80         Past Medical History:  Diagnosis Date   ANEMIA, MILD    CAD (coronary artery disease)    a. CAD,  b. s/p Cypher DES to the ramus and Promus DES x 2 to the RCA 08/2007 (minimal disease in LAD and CFX at that time), c. Myoview 8/10: EF 63%, inf thinning, small prior ant infarct, no ischemia;  d. s/p CABG 3/14 (L-LAD, RIMA-OM1)   DEGENERATIVE JOINT DISEASE, RIGHT HIP    DIVERTICULOSIS, COLON    ERECTILE DYSFUNCTION    GERD    Heart murmur    HIATAL HERNIA    HTN (hypertension)    Hyperlipidemia    statin intolerant   HYPERPLASIA PROSTATE UNS W/O UR OBST & OTH LUTS    LUMBAR RADICULOPATHY, RIGHT    Other abnormal glucose    PAD (peripheral artery disease) ([place])    pre CABG 04/2012 => ABIs: Right 0.92, left 0.61 // ABI  09/02/14: R 0.93; L 0.82   SPINAL STENOSIS, LUMBAR    Past Surgical History:  Procedure Laterality Date   COLONOSCOPY  2002   negative   CORONARY ARTERY BYPASS GRAFT N/A 04/22/2012   Procedure: CORONARY ARTERY BYPASS GRAFTING (CABG);  Surgeon: [name], MD;  Location: [place];  Service: Open Heart Surgery;  Laterality: N/A;  CABG X 2, BIMA, POSSIBLE EVH   CORONARY STENT PLACEMENT  2010    X 2   TEE WITHOUT CARDIOVERSION N/A 04/22/2012   Procedure: TRANSESOPHAGEAL ECHOCARDIOGRAM (TEE);  Surgeon: [name], MD;  Location: [place];  Service: Open Heart Surgery;  Laterality: N/A;   TOTAL HIP ARTHROPLASTY  2009    reports that he quit smoking about 35 years ago. He has a 18.00 pack-year smoking history. He has never used smokeless tobacco. He reports that he does not drink alcohol and does not use drugs. family history includes Diabetes in his maternal grandmother; Heart attack (age of onset: 50) in his father; Heart attack (age of onset: 13) in his brother; Hyperlipidemia in his brother and mother; Hypertension in his father; Pulmonary embolism in his brother; Stroke in his brother. Allergies  Allergen Reactions   Olmesartan Medoxomil Rash   Quinapril Hcl Other (See Comments)  cough; ACE inhibitors are contraindicated because of a history of rash with Angiotensin receptor blocker.   Glucosamine Other (See Comments)    Doesn't remember reaction   Livalo [Pitavastatin]     cramps   Rosuvastatin Other (See Comments)    : muscle cramps   Verapamil Other (See Comments)    Extra sensitive to sun   Current Outpatient Medications on File Prior to Visit  Medication Sig Dispense Refill   clopidogrel (PLAVIX) 75 MG tablet Take 75 mg by mouth 2 (two) times a week.     fluticasone (FLONASE) 50 MCG/ACT nasal spray Place 2 sprays into both nostrils daily. 16 g 6   Multiple Vitamin (MULTIVITAMIN WITH MINERALS) TABS Take 1 tablet by mouth daily. Centrum silver     nitroGLYCERIN  (NITROSTAT) 0.4 MG SL tablet PLACE 1 TABLET UNDER THE TONGUE EVER Y 5 MINUTES AS NEEDED FOR CHEST PAIN (3 DOSES MAXIMUM) 25 tablet 0   rosuvastatin (CRESTOR) 20 MG tablet Take 1 tablet (20 mg total) by mouth daily. 90 tablet 3   solifenacin (VESICARE) 5 MG tablet Take 1 tablet (5 mg total) by mouth daily. 90 tablet 3   tamsulosin (FLOMAX) 0.4 MG CAPS capsule Take 0.4 mg by mouth daily.     amLODipine (NORVASC) 10 MG tablet TAKE 1 TABLET BY MOUTH EVERY DAY (Patient not taking: No sig reported) 90 tablet 3   No current facility-administered medications on file prior to visit.        ROS:  All others reviewed and negative.  Objective        PE:  BP (!) 152/88   Pulse 81   Temp 98.9 F (37.2 C) (Oral)   Ht '6\' 2"'$  (1.88 m)   Wt 189 lb (85.7 kg)   SpO2 97%   BMI 24.27 kg/m                 Constitutional: Pt appears in NAD               HENT: Head: NCAT.                Right Ear: External ear normal.                 Left Ear: External ear normal. Bialteral wax impactions resolved with irrigation               Eyes: . Pupils are equal, round, and reactive to light. Conjunctivae and EOM are normal               Nose: without d/c or deformity               Neck: Neck supple. Gross normal ROM               Cardiovascular: Normal rate and regular rhythm.                 Pulmonary/Chest: Effort normal and breath sounds without rales or wheezing.                Abd:  Soft, NT, ND, + BS, no organomegaly               Neurological: Pt is alert. At baseline orientation, motor grossly intact               Skin: Skin is warm. No rashes, no other new lesions, LE edema - none  Psychiatric: Pt behavior is normal without agitation   Micro: none  Cardiac tracings I have personally interpreted today:  none  Pertinent Radiological findings (summarize): none   Lab Results  Component Value Date   WBC 6.6 04/27/2020   HGB 13.0 04/27/2020   HCT 39.4 04/27/2020   PLT 235.0 04/27/2020    GLUCOSE 81 04/27/2020   CHOL 231 (H) 04/27/2020   TRIG 63.0 04/27/2020   HDL 53.90 04/27/2020   LDLDIRECT 130.4 11/17/2009   LDLCALC 165 (H) 04/27/2020   ALT 16 04/27/2020   AST 18 04/27/2020   NA 139 04/27/2020   K 3.7 04/27/2020   CL 104 04/27/2020   CREATININE 1.01 04/27/2020   BUN 16 04/27/2020   CO2 28 04/27/2020   TSH 2.43 04/27/2020   PSA 6.26 (H) 04/27/2020   INR 1.28 04/22/2012   HGBA1C 6.0 04/27/2020   Assessment/Plan:  JAYMIAN SUGIMOTO is a 70 y.o. Black or African American [2] male with  has a past medical history of ANEMIA, MILD, CAD (coronary artery disease), DEGENERATIVE JOINT DISEASE, RIGHT HIP, DIVERTICULOSIS, COLON, ERECTILE DYSFUNCTION, GERD, Heart murmur, HIATAL HERNIA, HTN (hypertension), Hyperlipidemia, HYPERPLASIA PROSTATE UNS W/O UR OBST & OTH LUTS, LUMBAR RADICULOPATHY, RIGHT, Other abnormal glucose, PAD (peripheral artery disease) ([place]), and SPINAL STENOSIS, LUMBAR.  Vertigo Mild worsening with head position change, for meclizine prn,  to f/u any worsening symptoms or concerns  Impacted cerumen of both ears Patient agreed to ear lavage due to bilateral cerumen impaction; Ear lavage completed with no difficulty, impaction resolved, TM's look normal; she will continue using OTC kits regularly for prevention  Hearing improved  HTN (hypertension) BP Readings from Last 3 Encounters:  08/31/20 (!) 152/88  04/27/20 130/76  11/06/19 120/80   uncontrolled, pt to continue medical treatment and restart amlodipine 10 mg, continue to monitor bp at home and next visit   BPH (benign prostatic hyperplasia) With mild worsening symptoms out of flomax - for restart,  to f/u any worsening symptoms or concerns  Allergic rhinitis Mild to mod, for nasacort asd, to f/u any worsening symptoms or concerns  Followup: Return if symptoms worsen or fail to improve.  Cathlean Cower, MD 09/02/2020 7:06 AM [facility]

## 2020-09-02 ENCOUNTER — Encounter: Payer: Self-pay | Admitting: Internal Medicine

## 2020-09-02 NOTE — Assessment & Plan Note (Signed)
With mild worsening symptoms out of flomax - for restart,  to f/u any worsening symptoms or concerns

## 2020-09-02 NOTE — Assessment & Plan Note (Signed)
Patient agreed to ear lavage due to bilateral cerumen impaction; Ear lavage completed with no difficulty, impaction resolved, TM's look normal; she will continue using OTC kits regularly for prevention  Hearing improved

## 2020-09-02 NOTE — Assessment & Plan Note (Signed)
BP Readings from Last 3 Encounters:  08/31/20 (!) 152/88  04/27/20 130/76  11/06/19 120/80   uncontrolled, pt to continue medical treatment and restart amlodipine 10 mg, continue to monitor bp at home and next visit

## 2020-09-02 NOTE — Assessment & Plan Note (Signed)
Mild to mod, for nasacort asd,  to f/u any worsening symptoms or concerns 

## 2020-09-02 NOTE — Assessment & Plan Note (Signed)
Mild worsening with head position change, for meclizine prn,  to f/u any worsening symptoms or concerns

## 2020-09-08 DIAGNOSIS — H2513 Age-related nuclear cataract, bilateral: Secondary | ICD-10-CM | POA: Diagnosis not present

## 2020-09-08 DIAGNOSIS — H40033 Anatomical narrow angle, bilateral: Secondary | ICD-10-CM | POA: Diagnosis not present

## 2020-10-23 ENCOUNTER — Other Ambulatory Visit: Payer: Self-pay | Admitting: Cardiovascular Disease

## 2020-10-25 ENCOUNTER — Other Ambulatory Visit: Payer: Self-pay | Admitting: Cardiovascular Disease

## 2020-12-07 DIAGNOSIS — I10 Essential (primary) hypertension: Secondary | ICD-10-CM | POA: Diagnosis not present

## 2020-12-07 DIAGNOSIS — Z8249 Family history of ischemic heart disease and other diseases of the circulatory system: Secondary | ICD-10-CM | POA: Diagnosis not present

## 2020-12-07 DIAGNOSIS — J302 Other seasonal allergic rhinitis: Secondary | ICD-10-CM | POA: Diagnosis not present

## 2020-12-07 DIAGNOSIS — N400 Enlarged prostate without lower urinary tract symptoms: Secondary | ICD-10-CM | POA: Diagnosis not present

## 2020-12-07 DIAGNOSIS — E785 Hyperlipidemia, unspecified: Secondary | ICD-10-CM | POA: Diagnosis not present

## 2020-12-07 DIAGNOSIS — Z7982 Long term (current) use of aspirin: Secondary | ICD-10-CM | POA: Diagnosis not present

## 2020-12-07 DIAGNOSIS — M1990 Unspecified osteoarthritis, unspecified site: Secondary | ICD-10-CM | POA: Diagnosis not present

## 2020-12-07 DIAGNOSIS — I2510 Atherosclerotic heart disease of native coronary artery without angina pectoris: Secondary | ICD-10-CM | POA: Diagnosis not present

## 2020-12-07 DIAGNOSIS — N529 Male erectile dysfunction, unspecified: Secondary | ICD-10-CM | POA: Diagnosis not present

## 2020-12-16 ENCOUNTER — Other Ambulatory Visit: Payer: Self-pay | Admitting: Cardiovascular Disease

## 2021-03-10 DIAGNOSIS — Z125 Encounter for screening for malignant neoplasm of prostate: Secondary | ICD-10-CM | POA: Diagnosis not present

## 2021-03-17 DIAGNOSIS — Z125 Encounter for screening for malignant neoplasm of prostate: Secondary | ICD-10-CM | POA: Diagnosis not present

## 2021-03-17 DIAGNOSIS — N35811 Other urethral stricture, male, meatal: Secondary | ICD-10-CM | POA: Diagnosis not present

## 2021-03-17 DIAGNOSIS — N400 Enlarged prostate without lower urinary tract symptoms: Secondary | ICD-10-CM | POA: Diagnosis not present

## 2021-03-18 ENCOUNTER — Telehealth: Payer: Self-pay | Admitting: Internal Medicine

## 2021-03-18 NOTE — Telephone Encounter (Signed)
While speaking with patient he stated his reception on his phone was bad and he would call back.  If patient calls back:  Please schedule at anytime with [facility]      40 Minutes appointment   Any questions, please call me at [phone]

## 2021-03-24 ENCOUNTER — Ambulatory Visit: Payer: Medicare PPO | Admitting: Internal Medicine

## 2021-03-24 ENCOUNTER — Other Ambulatory Visit: Payer: Self-pay | Admitting: Internal Medicine

## 2021-03-24 ENCOUNTER — Other Ambulatory Visit: Payer: Self-pay

## 2021-03-24 ENCOUNTER — Encounter: Payer: Self-pay | Admitting: Internal Medicine

## 2021-03-24 ENCOUNTER — Ambulatory Visit (INDEPENDENT_AMBULATORY_CARE_PROVIDER_SITE_OTHER): Payer: Medicare PPO

## 2021-03-24 VITALS — BP 130/74 | HR 76 | Temp 98.3°F | Ht 74.0 in | Wt 192.6 lb

## 2021-03-24 DIAGNOSIS — E7800 Pure hypercholesterolemia, unspecified: Secondary | ICD-10-CM

## 2021-03-24 DIAGNOSIS — G8929 Other chronic pain: Secondary | ICD-10-CM

## 2021-03-24 DIAGNOSIS — M5441 Lumbago with sciatica, right side: Secondary | ICD-10-CM

## 2021-03-24 DIAGNOSIS — I10 Essential (primary) hypertension: Secondary | ICD-10-CM

## 2021-03-24 DIAGNOSIS — Z0001 Encounter for general adult medical examination with abnormal findings: Secondary | ICD-10-CM | POA: Diagnosis not present

## 2021-03-24 DIAGNOSIS — E559 Vitamin D deficiency, unspecified: Secondary | ICD-10-CM | POA: Diagnosis not present

## 2021-03-24 DIAGNOSIS — M545 Low back pain, unspecified: Secondary | ICD-10-CM | POA: Diagnosis not present

## 2021-03-24 DIAGNOSIS — R7309 Other abnormal glucose: Secondary | ICD-10-CM

## 2021-03-24 DIAGNOSIS — Z789 Other specified health status: Secondary | ICD-10-CM

## 2021-03-24 DIAGNOSIS — M47816 Spondylosis without myelopathy or radiculopathy, lumbar region: Secondary | ICD-10-CM | POA: Diagnosis not present

## 2021-03-24 DIAGNOSIS — E538 Deficiency of other specified B group vitamins: Secondary | ICD-10-CM

## 2021-03-24 DIAGNOSIS — M25551 Pain in right hip: Secondary | ICD-10-CM | POA: Diagnosis not present

## 2021-03-24 LAB — PSA: PSA: 6.55 ng/mL — ABNORMAL HIGH (ref 0.10–4.00)

## 2021-03-24 LAB — BASIC METABOLIC PANEL
BUN: 18 mg/dL (ref 6–23)
CO2: 33 mEq/L — ABNORMAL HIGH (ref 19–32)
Calcium: 10.1 mg/dL (ref 8.4–10.5)
Chloride: 103 mEq/L (ref 96–112)
Creatinine, Ser: 0.99 mg/dL (ref 0.40–1.50)
GFR: 76.58 mL/min (ref >60.00)
Glucose, Bld: 86 mg/dL (ref 70–99)
Potassium: 3.8 mEq/L (ref 3.5–5.1)
Sodium: 138 mEq/L (ref 135–145)

## 2021-03-24 LAB — CBC WITH DIFFERENTIAL/PLATELET
Basophils Absolute: 0 10*3/uL (ref 0.0–0.1)
Basophils Relative: 0.7 % (ref 0.0–3.0)
Eosinophils Absolute: 0.1 10*3/uL (ref 0.0–0.7)
Eosinophils Relative: 0.9 % (ref 0.0–5.0)
HCT: 37 % — ABNORMAL LOW (ref 39.0–52.0)
Hemoglobin: 12 g/dL — ABNORMAL LOW (ref 13.0–17.0)
Lymphocytes Relative: 19.2 % (ref 12.0–46.0)
Lymphs Abs: 1.3 10*3/uL (ref 0.7–4.0)
MCHC: 32.5 g/dL (ref 30.0–36.0)
MCV: 85.5 fl (ref 78.0–100.0)
Monocytes Absolute: 0.6 10*3/uL (ref 0.1–1.0)
Monocytes Relative: 8.9 % (ref 3.0–12.0)
Neutro Abs: 4.7 10*3/uL (ref 1.4–7.7)
Neutrophils Relative %: 70.3 % (ref 43.0–77.0)
Platelets: 248 10*3/uL (ref 150.0–400.0)
RBC: 4.33 Mil/uL (ref 4.22–5.81)
RDW: 14.7 % (ref 11.5–15.5)
WBC: 6.7 10*3/uL (ref 4.0–10.5)

## 2021-03-24 LAB — URINALYSIS, ROUTINE W REFLEX MICROSCOPIC
Bilirubin Urine: NEGATIVE
Hgb urine dipstick: NEGATIVE
Ketones, ur: NEGATIVE
Leukocytes,Ua: NEGATIVE
Nitrite: NEGATIVE
RBC / HPF: NONE SEEN (ref 0–?)
Specific Gravity, Urine: 1.015 (ref 1.000–1.030)
Total Protein, Urine: NEGATIVE
Urine Glucose: NEGATIVE
Urobilinogen, UA: 0.2 (ref 0.0–1.0)
WBC, UA: NONE SEEN (ref 0–?)
pH: 6 (ref 5.0–8.0)

## 2021-03-24 LAB — LIPID PANEL
Cholesterol: 254 mg/dL — ABNORMAL HIGH (ref 0–200)
HDL: 63.8 mg/dL (ref >39.00)
LDL Cholesterol: 178 mg/dL — ABNORMAL HIGH (ref 0–99)
NonHDL: 190.56
Total CHOL/HDL Ratio: 4
Triglycerides: 62 mg/dL (ref 0.0–149.0)
VLDL: 12.4 mg/dL (ref 0.0–40.0)

## 2021-03-24 LAB — HEMOGLOBIN A1C: Hgb A1c MFr Bld: 6 % (ref 4.6–6.5)

## 2021-03-24 LAB — TSH: TSH: 2.01 u[IU]/mL (ref 0.35–5.50)

## 2021-03-24 LAB — VITAMIN D 25 HYDROXY (VIT D DEFICIENCY, FRACTURES): VITD: 38.53 ng/mL (ref 30.00–100.00)

## 2021-03-24 LAB — HEPATIC FUNCTION PANEL
ALT: 13 U/L (ref 0–53)
AST: 19 U/L (ref 0–37)
Albumin: 4.3 g/dL (ref 3.5–5.2)
Alkaline Phosphatase: 78 U/L (ref 39–117)
Bilirubin, Direct: 0.1 mg/dL (ref 0.0–0.3)
Total Bilirubin: 0.4 mg/dL (ref 0.2–1.2)
Total Protein: 7.1 g/dL (ref 6.0–8.3)

## 2021-03-24 LAB — VITAMIN B12: Vitamin B-12: 423 pg/mL (ref 211–911)

## 2021-03-24 NOTE — Progress Notes (Signed)
Patient ID: Eric Dickerson, male   DOB: 02/21/1950, 71 y.o.   MRN: [id number]         Chief Complaint:: wellness exam and low back pain, htn , hld       HPI:  Eric Dickerson is a 71 y.o. 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  Mild to mod, no raducilar symptoms, intemrittent, worse to stand and walk.  No recent gait change or falls. Chart lists hx of lumbar spinal stenosis.  Pt asking for plain films today and f/u sport med.  Pt denies chest pain, increased sob or doe, wheezing, orthopnea, PND, increased LE swelling, palpitations, dizziness or syncope.   Pt denies polydipsia, polyuria, or new focal neuro s/s.   Pt denies fever, wt loss, night sweats, loss of appetite, or other constitutional symptoms    Wt Readings from Last 3 Encounters:  03/24/21 192 lb 9.6 oz (87.4 kg)  08/31/20 189 lb (85.7 kg)  04/27/20 193 lb (87.5 kg)   BP Readings from Last 3 Encounters:  03/24/21 130/74  08/31/20 (!) 152/88  04/27/20 130/76   Immunization History  Administered Date(s) Administered   PFIZER(Purple Top)SARS-COV-2 Vaccination 03/30/2019, 04/22/2019   Td 11/15/2009   There are no preventive care reminders to display for this patient.     Past Medical History:  Diagnosis Date   ANEMIA, MILD    CAD (coronary artery disease)    a. CAD,  b. s/p Cypher DES to the ramus and Promus DES x 2 to the RCA 08/2007 (minimal disease in LAD and CFX at that time), c. Myoview 8/10: EF 63%, inf thinning, small prior ant infarct, no ischemia;  d. s/p CABG 3/14 (L-LAD, RIMA-OM1)   DEGENERATIVE JOINT DISEASE, RIGHT HIP    DIVERTICULOSIS, COLON    ERECTILE DYSFUNCTION    GERD    Heart murmur    HIATAL HERNIA    HTN (hypertension)    Hyperlipidemia    statin intolerant   HYPERPLASIA  PROSTATE UNS W/O UR OBST & OTH LUTS    LUMBAR RADICULOPATHY, RIGHT    Other abnormal glucose    PAD (peripheral artery disease) ([place])    pre CABG 04/2012 => ABIs: Right 0.92, left 0.61 // ABI 09/02/14: R 0.93; L 0.82   SPINAL STENOSIS, LUMBAR    Past Surgical History:  Procedure Laterality Date   COLONOSCOPY  2002   negative   CORONARY ARTERY BYPASS GRAFT N/A 04/22/2012   Procedure: CORONARY ARTERY BYPASS GRAFTING (CABG);  Surgeon: [name], MD;  Location: [facility];  Service: Open Heart Surgery;  Laterality: N/A;  CABG X 2, BIMA, POSSIBLE EVH   CORONARY STENT PLACEMENT  2010    X 2   TEE WITHOUT CARDIOVERSION N/A 04/22/2012   Procedure: TRANSESOPHAGEAL ECHOCARDIOGRAM (TEE);  Surgeon: [name], MD;  Location: [facility];  Service: Open Heart Surgery;  Laterality: N/A;   TOTAL HIP ARTHROPLASTY  2009    reports that he quit smoking about 36 years ago. His smoking use included cigarettes. He has a 18.00 pack-year smoking history. He has never used smokeless tobacco. He reports that he does not drink alcohol  and does not use drugs. family history includes Diabetes in his maternal grandmother; Heart attack (age of onset: 8) in his father; Heart attack (age of onset: 14) in his brother; Hyperlipidemia in his brother and mother; Hypertension in his father; Pulmonary embolism in his brother; Stroke in his brother. Allergies  Allergen Reactions   Olmesartan Medoxomil Rash   Quinapril Hcl Other (See Comments)     cough; ACE inhibitors are contraindicated because of a history of rash with Angiotensin receptor blocker.   Glucosamine Other (See Comments)    Doesn't remember reaction   Livalo [Pitavastatin]     cramps   Rosuvastatin Other (See Comments)    : muscle cramps   Verapamil Other (See Comments)    Extra sensitive to sun   Current Outpatient Medications on File Prior to Visit  Medication Sig Dispense Refill   amLODipine (NORVASC) 10 MG tablet TAKE 1 TABLET BY MOUTH EVERY  DAY. Please keep upcoming appt in March 2023 with Dr. Johnsie Cancel before anymore refills. Thank you 30 tablet 4   fluticasone (FLONASE) 50 MCG/ACT nasal spray Place 2 sprays into both nostrils daily. 16 g 6   meclizine (ANTIVERT) 12.5 MG tablet Take 1 tablet (12.5 mg total) by mouth 3 (three) times daily as needed for dizziness. 40 tablet 1   Multiple Vitamin (MULTIVITAMIN WITH MINERALS) TABS Take 1 tablet by mouth daily. Centrum silver     nitroGLYCERIN (NITROSTAT) 0.4 MG SL tablet PLACE 1 TABLET UNDER THE TONGUE EVER Y 5 MINUTES AS NEEDED FOR CHEST PAIN (3 DOSES MAXIMUM) 25 tablet 0   tamsulosin (FLOMAX) 0.4 MG CAPS capsule Take 0.4 mg by mouth daily.     triamcinolone (NASACORT) 55 MCG/ACT AERO nasal inhaler Place 2 sprays into the nose daily. 1 each 12   clopidogrel (PLAVIX) 75 MG tablet Take 75 mg by mouth 2 (two) times a week. (Patient not taking: Reported on 03/24/2021)     No current facility-administered medications on file prior to visit.        ROS:  All others reviewed and negative.  Objective        PE:  BP 130/74 (BP Location: Right Arm, Patient Position: Sitting, Cuff Size: Large)    Pulse 76    Temp 98.3 F (36.8 C) (Oral)    Ht 6\' 2"  (1.88 m)    Wt 192 lb 9.6 oz (87.4 kg)    SpO2 99%    BMI 24.73 kg/m                 Constitutional: Pt appears in NAD               HENT: Head: NCAT.                Right Ear: External ear normal.                 Left Ear: External ear normal.                Eyes: . Pupils are equal, round, and reactive to light. Conjunctivae and EOM are normal               Nose: without d/c or deformity               Neck: Neck supple. Gross normal ROM               Cardiovascular: Normal rate and regular rhythm.  Pulmonary/Chest: Effort normal and breath sounds without rales or wheezing.                Abd:  Soft, NT, ND, + BS, no organomegaly               Neurological: Pt is alert. At baseline orientation, motor grossly intact                Skin: Skin is warm. No rashes, no other new lesions, LE edema - none               Psychiatric: Pt behavior is normal without agitation   Micro: none  Cardiac tracings I have personally interpreted today:  none  Pertinent Radiological findings (summarize): none   Lab Results  Component Value Date   WBC 6.7 03/24/2021   HGB 12.0 (L) 03/24/2021   HCT 37.0 (L) 03/24/2021   PLT 248.0 03/24/2021   GLUCOSE 86 03/24/2021   CHOL 254 (H) 03/24/2021   TRIG 62.0 03/24/2021   HDL 63.80 03/24/2021   LDLDIRECT 130.4 11/17/2009   LDLCALC 178 (H) 03/24/2021   ALT 13 03/24/2021   AST 19 03/24/2021   NA 138 03/24/2021   K 3.8 03/24/2021   CL 103 03/24/2021   CREATININE 0.99 03/24/2021   BUN 18 03/24/2021   CO2 33 (H) 03/24/2021   TSH 2.01 03/24/2021   PSA 6.55 (H) 03/24/2021   INR 1.28 04/22/2012   HGBA1C 6.0 03/24/2021   Assessment/Plan:  Eric Dickerson is a 71 y.o. Black or African American [2] male with  has a past medical history of ANEMIA, MILD, CAD (coronary artery disease), DEGENERATIVE JOINT DISEASE, RIGHT HIP, DIVERTICULOSIS, COLON, ERECTILE DYSFUNCTION, GERD, Heart murmur, HIATAL HERNIA, HTN (hypertension), Hyperlipidemia, HYPERPLASIA PROSTATE UNS W/O UR OBST & OTH LUTS, LUMBAR RADICULOPATHY, RIGHT, Other abnormal glucose, PAD (peripheral artery disease) ([place]), and SPINAL STENOSIS, LUMBAR.  Encounter for well adult exam with abnormal findings Age and sex appropriate education and counseling updated with regular exercise and diet Referrals for preventative services - none needed Immunizations addressed - declines tdap, shingrix, pneumovax Smoking counseling  - none needed Evidence for depression or other mood disorder - none significant Most recent labs reviewed. I have personally reviewed and have noted: 1) the patient's medical and social history 2) The patient's current medications and supplements 3) The patient's height, weight, and BMI have been recorded in the  chart   Statin intolerance Remains statin intolerant, for lower chol diet  HTN (hypertension) BP Readings from Last 3 Encounters:  03/24/21 130/74  08/31/20 (!) 152/88  04/27/20 130/76   Stable, pt to continue medical treatment amlodipine   Hyperlipidemia Lab Results  Component Value Date   LDLCALC 178 (H) 03/24/2021   Severe uncontrolled, pt to continue low chol diet, remains statin intolerant   Other abnormal glucose Lab Results  Component Value Date   HGBA1C 6.0 03/24/2021   Stable, pt to continue current medical treatment  - diet   Low back pain Recent worsening, etiology unclear, for xray, refer sport med  Vitamin D deficiency Last vitamin D Lab Results  Component Value Date   VD25OH 38.53 03/24/2021   Low, to start oral replacement   Followup: Return in about 1 year (around 03/24/2022).  Cathlean Cower, MD 03/27/2021 8:38 PM [facility]

## 2021-03-24 NOTE — Patient Instructions (Addendum)
Please continue all other medications as before, and refills have been done if requested.  Please have the pharmacy call with any other refills you may need.  Please continue your efforts at being more active, low cholesterol diet, and weight control.  You are otherwise up to date with prevention measures today.  Please keep your appointments with your specialists as you may have planned  You will be contacted regarding the referral for: sports medicine  Please go to the XRAY Department in the first floor for the x-ray testing  Please go to the LAB at the blood drawing area for the tests to be done  You will be contacted by phone if any changes need to be made immediately.  Otherwise, you will receive a letter about your results with an explanation, but please check with MyChart first.  Please remember to sign up for MyChart if you have not done so, as this will be important to you in the future with finding out test results, communicating by private email, and scheduling acute appointments online when needed.  Please make an Appointment to return for your 1 year visit, or sooner if needed, with Lab testing by Appointment as well, to be done about 3-5 days before at the [facility] (so this is for TWO appointments - please see the scheduling desk as you leave)   Due to the ongoing Covid 19 pandemic, our lab now requires an appointment for any labs done at our office.  If you need labs done and do not have an appointment, please call our office ahead of time to schedule before presenting to the lab for your testing.

## 2021-03-27 ENCOUNTER — Encounter: Payer: Self-pay | Admitting: Internal Medicine

## 2021-03-27 DIAGNOSIS — E559 Vitamin D deficiency, unspecified: Secondary | ICD-10-CM | POA: Insufficient documentation

## 2021-03-27 NOTE — Assessment & Plan Note (Signed)
BP Readings from Last 3 Encounters:  03/24/21 130/74  08/31/20 (!) 152/88  04/27/20 130/76   Stable, pt to continue medical treatment amlodipine

## 2021-03-27 NOTE — Assessment & Plan Note (Signed)
Recent worsening, etiology unclear, for xray, refer sport med

## 2021-03-27 NOTE — Assessment & Plan Note (Signed)
Lab Results  Component Value Date   LDLCALC 178 (H) 03/24/2021   Severe uncontrolled, pt to continue low chol diet, remains statin intolerant

## 2021-03-27 NOTE — Assessment & Plan Note (Signed)
Last vitamin D Lab Results  Component Value Date   VD25OH 38.53 03/24/2021   Low, to start oral replacement

## 2021-03-27 NOTE — Assessment & Plan Note (Signed)
Remains statin intolerant, for lower chol diet

## 2021-03-27 NOTE — Assessment & Plan Note (Signed)
Age and sex appropriate education and counseling updated with regular exercise and diet Referrals for preventative services - none needed Immunizations addressed - declines tdap, shingrix, pneumovax Smoking counseling  - none needed Evidence for depression or other mood disorder - none significant Most recent labs reviewed. I have personally reviewed and have noted: 1) the patient's medical and social history 2) The patient's current medications and supplements 3) The patient's height, weight, and BMI have been recorded in the chart

## 2021-03-27 NOTE — Assessment & Plan Note (Signed)
Lab Results  Component Value Date   HGBA1C 6.0 03/24/2021   Stable, pt to continue current medical treatment  - diet

## 2021-03-29 NOTE — Progress Notes (Signed)
Eric Dickerson.[facility] [place] Phone: [phone]   Assessment and Plan:    1. Chronic right-sided low back pain without sciatica 2. DDD (degenerative disc disease), lumbar 3. Somatic dysfunction of lumbar region 4. Somatic dysfunction of pelvic region 5. Somatic dysfunction of sacral region -Chronic with exacerbation, subsequent sports medicine visit - Intermittent low back pain with worsening low back stiffness over the past 1 year - Pain is well controlled using primarily Tylenol with ibuprofen/Aleve for breakthrough pain.  Continue this medication regimen - Start HEP to prevent back stiffness - Patient has received significant relief with OMT in the past.  Elects for repeat OMT today.  Tolerated well per note below. - Decision today to treat with OMT was based on Physical Exam  After verbal consent patient was treated with HVLA (high velocity low amplitude), ME (muscle energy), FPR (flex positional release), ST (soft tissue), PC/PD (Pelvic Compression/ Pelvic Decompression) techniques in sacrum, lumbar, and pelvic areas. Patient tolerated the procedure well with improvement in symptoms.  Patient educated on potential side effects of soreness and recommended to rest, hydrate, and use Tylenol as needed for pain control.    Pertinent previous records reviewed include hip x-ray 03/24/2021, lumbar spine x-ray 03/24/2021,PCP note 03/24/2021   Follow Up: 3 to 4 weeks for repeat OMT and discussion of back stiffness   Subjective:   I, Eric Dickerson, am serving as a Education administrator for Doctor Glennon Mac  Chief Complaint: Right side low back   HPI:  03/30/2021 Patient is a 71 year old male complaining of right sided low back pain. Patient states has been stiff for the last 4-5 years , no MOI he thinks its arthritis , no radiating pain, no numbness tingling, doesn't hurt just uncomfortable when its bad he isn't able to stand  straight up feels like something is pulling him, aleve and tylenol is the only thing that is keeping him going   Relevant Historical Information: History right hip total replacement, hypertension  Additional pertinent review of systems negative.   Current Outpatient Medications:    amLODipine (NORVASC) 10 MG tablet, TAKE 1 TABLET BY MOUTH EVERY DAY. Please keep upcoming appt in March 2023 with Dr. Johnsie Cancel before anymore refills. Thank you, Disp: 30 tablet, Rfl: 4   clopidogrel (PLAVIX) 75 MG tablet, Take 75 mg by mouth 2 (two) times a week., Disp: , Rfl:    fluticasone (FLONASE) 50 MCG/ACT nasal spray, Place 2 sprays into both nostrils daily., Disp: 16 g, Rfl: 6   meclizine (ANTIVERT) 12.5 MG tablet, Take 1 tablet (12.5 mg total) by mouth 3 (three) times daily as needed for dizziness., Disp: 40 tablet, Rfl: 1   Multiple Vitamin (MULTIVITAMIN WITH MINERALS) TABS, Take 1 tablet by mouth daily. Centrum silver, Disp: , Rfl:    nitroGLYCERIN (NITROSTAT) 0.4 MG SL tablet, PLACE 1 TABLET UNDER THE TONGUE EVER Y 5 MINUTES AS NEEDED FOR CHEST PAIN (3 DOSES MAXIMUM), Disp: 25 tablet, Rfl: 0   tamsulosin (FLOMAX) 0.4 MG CAPS capsule, Take 0.4 mg by mouth daily., Disp: , Rfl:    triamcinolone (NASACORT) 55 MCG/ACT AERO nasal inhaler, Place 2 sprays into the nose daily., Disp: 1 each, Rfl: 12   Objective:     Vitals:   03/30/21 1423  BP: 130/80  Pulse: 89  SpO2: 95%  Weight: 189 lb (85.7 kg)  Height: 6\' 2"  (1.88 m)      Body mass index is 24.27 kg/m.  Physical Exam:    Gen: Appears well, nad, nontoxic and pleasant Psych: Alert and oriented, appropriate mood and affect Neuro: sensation intact, strength is 5/5 in upper and lower extremities, muscle tone wnl Skin: no susupicious lesions or rashes  Back - Normal skin, Spine with normal alignment and no deformity.   No tenderness to vertebral process palpation.   Paraspinous muscles are not tender and without spasm Straight leg raise  negative Trendelenberg negative  General: Well-appearing, cooperative, sitting comfortably in no acute distress.   OMT Physical Exam:  ASIS Compression Test: Positive Right Sacrum: Positive sphinx, NTTP sacral base Lumbar: TTP mildly paraspinal, L2 RRSR Pelvis: Right anterior innominate    Electronically signed by:  Eric Dickerson.[facility] 2:50 PM 03/30/21

## 2021-03-30 ENCOUNTER — Other Ambulatory Visit: Payer: Self-pay

## 2021-03-30 ENCOUNTER — Ambulatory Visit: Payer: Medicare PPO | Admitting: Sports Medicine

## 2021-03-30 VITALS — BP 130/80 | HR 89 | Ht 74.0 in | Wt 189.0 lb

## 2021-03-30 DIAGNOSIS — M9905 Segmental and somatic dysfunction of pelvic region: Secondary | ICD-10-CM

## 2021-03-30 DIAGNOSIS — M5136 Other intervertebral disc degeneration, lumbar region: Secondary | ICD-10-CM | POA: Diagnosis not present

## 2021-03-30 DIAGNOSIS — M545 Low back pain, unspecified: Secondary | ICD-10-CM | POA: Diagnosis not present

## 2021-03-30 DIAGNOSIS — G8929 Other chronic pain: Secondary | ICD-10-CM

## 2021-03-30 DIAGNOSIS — M9903 Segmental and somatic dysfunction of lumbar region: Secondary | ICD-10-CM | POA: Diagnosis not present

## 2021-03-30 DIAGNOSIS — M9904 Segmental and somatic dysfunction of sacral region: Secondary | ICD-10-CM

## 2021-03-30 NOTE — Patient Instructions (Addendum)
Good to see you  Low back HEP Tylenol [phone] mg 2-3 times a day for pain relief  Can use ibuprofen as needed for breakthrough pain  3-4 week OMT follow up

## 2021-04-12 NOTE — Progress Notes (Incomplete)
Cardiology Office Note   Date:  04/12/2021   ID:  Eric Dickerson, DOB Feb 26, 1950, MRN [id number]  PCP:  [name], MD  Cardiologist:  Dr. Johnsie Cancel    No chief complaint on file.     History of Present Illness: Eric Dickerson is a 71 y.o. male who presents for f/u CAD   He has a hx of CAD, s/p Cypher DES to the ramus and Promus DES x 2 to the RCA 08/2007 (minimal disease in LAD and CFX at that time), PAD, HL, HTN.  He developed progressively worsening chest pain in 3/14 and LHC demonstrated 3 v CAD.  He was referred for CABG by Dr. Roxan Hockey 04/22/12 (LIMA-LAD, free RIMA-OM1).      Cannot take asa due to GERD,  So has been maintained on plavix   Beta blocker d/c due to perception that it worsened his back pain and made him urinate more frequently    Seen by Lipid clinic and defers PSK9 or increasing crestor dose to achieve target LDL   He is still working at [employer] having issues with back and right hip that [place] replaced Seeing sports medicine for this   No angina   Past Medical History:  Diagnosis Date   ANEMIA, MILD    CAD (coronary artery disease)    a. CAD,  b. s/p Cypher DES to the ramus and Promus DES x 2 to the RCA 08/2007 (minimal disease in LAD and CFX at that time), c. Myoview 8/10: EF 63%, inf thinning, small prior ant infarct, no ischemia;  d. s/p CABG 3/14 (L-LAD, RIMA-OM1)   DEGENERATIVE JOINT DISEASE, RIGHT HIP    DIVERTICULOSIS, COLON    ERECTILE DYSFUNCTION    GERD    Heart murmur    HIATAL HERNIA    HTN (hypertension)    Hyperlipidemia    statin intolerant   HYPERPLASIA PROSTATE UNS W/O UR OBST & OTH LUTS    LUMBAR RADICULOPATHY, RIGHT    Other abnormal glucose    PAD (peripheral artery disease) ([place])    pre CABG 04/2012 => ABIs: Right 0.92, left 0.61 // ABI 09/02/14: R 0.93; L 0.82   SPINAL STENOSIS, LUMBAR     Past Surgical History:  Procedure Laterality Date   COLONOSCOPY  2002   negative   CORONARY ARTERY BYPASS GRAFT N/A 04/22/2012    Procedure: CORONARY ARTERY BYPASS GRAFTING (CABG);  Surgeon: [name], MD;  Location: [facility];  Service: Open Heart Surgery;  Laterality: N/A;  CABG X 2, BIMA, POSSIBLE EVH   CORONARY STENT PLACEMENT  2010    X 2   TEE WITHOUT CARDIOVERSION N/A 04/22/2012   Procedure: TRANSESOPHAGEAL ECHOCARDIOGRAM (TEE);  Surgeon: [name], MD;  Location: [facility];  Service: Open Heart Surgery;  Laterality: N/A;   TOTAL HIP ARTHROPLASTY  2009     Current Outpatient Medications  Medication Sig Dispense Refill   amLODipine (NORVASC) 10 MG tablet TAKE 1 TABLET BY MOUTH EVERY DAY. Please keep upcoming appt in March 2023 with Dr. Johnsie Cancel before anymore refills. Thank you 30 tablet 4   clopidogrel (PLAVIX) 75 MG tablet Take 75 mg by mouth 2 (two) times a week.     fluticasone (FLONASE) 50 MCG/ACT nasal spray Place 2 sprays into both nostrils daily. 16 g 6   meclizine (ANTIVERT) 12.5 MG tablet Take 1 tablet (12.5 mg total) by mouth 3 (three) times daily as needed for dizziness. 40 tablet 1  Multiple Vitamin (MULTIVITAMIN WITH MINERALS) TABS Take 1 tablet by mouth daily. Centrum silver     nitroGLYCERIN (NITROSTAT) 0.4 MG SL tablet PLACE 1 TABLET UNDER THE TONGUE EVER Y 5 MINUTES AS NEEDED FOR CHEST PAIN (3 DOSES MAXIMUM) 25 tablet 0   tamsulosin (FLOMAX) 0.4 MG CAPS capsule Take 0.4 mg by mouth daily.     triamcinolone (NASACORT) 55 MCG/ACT AERO nasal inhaler Place 2 sprays into the nose daily. 1 each 12   No current facility-administered medications for this visit.    Allergies:   Olmesartan medoxomil, Quinapril hcl, Glucosamine, Livalo [pitavastatin], Rosuvastatin, and Verapamil    Social History:  The patient  reports that he quit smoking about 36 years ago. His smoking use included cigarettes. He has a 18.00 pack-year smoking history. He has never used smokeless tobacco. He reports that he does not drink alcohol and does not use drugs.   Family History:  The patient's family history  includes Diabetes in his maternal grandmother; Heart attack (age of onset: 64) in his father; Heart attack (age of onset: 81) in his brother; Hyperlipidemia in his brother and mother; Hypertension in his father; Pulmonary embolism in his brother; Stroke in his brother.    ROS:  General:no colds or fevers, no weight changes CV:see HPI PUL:see HPI GI:no diarrhea constipation or melena, no indigestion  Wt Readings from Last 3 Encounters:  03/30/21 189 lb (85.7 kg)  03/24/21 192 lb 9.6 oz (87.4 kg)  08/31/20 189 lb (85.7 kg)     PHYSICAL EXAM: VS:  There were no vitals taken for this visit. , BMI There is no height or weight on file to calculate BMI.  Affect appropriate Healthy:  appears stated age 23: normal Neck supple with no adenopathy JVP normal no bruits no thyromegaly Lungs clear with no wheezing and good diaphragmatic motion Heart:  S1/S2 no murmur, no rub, gallop or click PMI normal post sternotomy  Abdomen: benighn, BS positve, no tenderness, no AAA no bruit.  No HSM or HJR Distal pulses intact with no bruits No edema Neuro non-focal Skin warm and dry No muscular weakness     EKG:  EKG is NOT ordered today.   Recent Labs: 03/24/2021: ALT 13; BUN 18; Creatinine, Ser 0.99; Hemoglobin 12.0; Platelets 248.0; Potassium 3.8; Sodium 138; TSH 2.01    Lipid Panel    Component Value Date/Time   CHOL 254 (H) 03/24/2021 1422   CHOL 157 07/01/2019 0719   TRIG 62.0 03/24/2021 1422   HDL 63.80 03/24/2021 1422   HDL 57 07/01/2019 0719   CHOLHDL 4 03/24/2021 1422   VLDL 12.4 03/24/2021 1422   LDLCALC 178 (H) 03/24/2021 1422   LDLCALC 89 07/01/2019 0719   LDLDIRECT 130.4 11/17/2009 0853       Other studies Reviewed: Additional studies/ records that were reviewed today include: last OV notes.   ASSESSMENT AND PLAN:  1.  CAD with CABG in 2014, no chest pain, on plavix due to intolerance to aSA No beta blocker due to side effects stable I told him if pulse stays  high or BP up  To call us and we would try alternative beta blocker   2.  HLD  Has been intolerant to livalo and crestor due to leg cramps  Rechallenge with crestor 20 mg daily and LDL improved from 143-89 but still above goal. He declined increasing crestor to 40 mg or starting PSK9  3.  HTN on amlodipine stable.      Current medicines are  reviewed with the patient today.  The patient Has no concerns regarding medicines.  The following changes have been made:  See above Labs/ tests ordered today include:see above  Disposition:   FU:  see above  Signed, Jenkins Rouge, MD  04/12/2021 1:54 PM    [facility] [place] [place], [place] Phone: [phone]; Fax: [phone]

## 2021-04-19 NOTE — Progress Notes (Signed)
? ? Eric Dickerson ?[facility] ?[place] ?Phone: [phone] ?  ?Assessment and Plan:   ?  ?1. Chronic right-sided low back pain without sciatica ?2. DDD (degenerative disc disease), lumbar ?3. Somatic dysfunction of lumbar region ?4. Somatic dysfunction of pelvic region ?5. Somatic dysfunction of sacral region ?-Chronic with exacerbation, subsequent sports medicine visit ?- Continued chronic low back pain, though overall improved with Tylenol use and OMT at previous office visit ?- Continue Tylenol for day-to-day pain and use NSAIDs as needed for breakthrough pain ?- Continue HEP.  Patient stated that he had not been able to frequently do HEP, but will try to increase frequency moving forward ?-- Patient has received significant relief with OMT in the past.  Elects for repeat OMT today.  Tolerated well per note below. ?- Decision today to treat with OMT was based on Physical Exam ? ?After verbal consent patient was treated with HVLA (high velocity low amplitude), ME (muscle energy), FPR (flex positional release), ST (soft tissue), PC/PD (Pelvic Compression/ Pelvic Decompression) techniques in sacrum, lumbar, and pelvic areas. Patient tolerated the procedure well with improvement in symptoms.  Patient educated on potential side effects of soreness and recommended to rest, hydrate, and use Tylenol as needed for pain control.  ?  ?Pertinent previous records reviewed include none ?  ?Follow Up: 4 weeks for repeat OMT maintenance ?  ?Subjective:   ?I, Pincus Badder, am serving as a Education administrator for [employer] ? ?Chief Complaint: right sided low back pain  ? ?HPI:  ?03/30/2021 ?Patient is a 71 year old male complaining of right sided low back pain. Patient states has been stiff for the last 4-5 years , no MOI he thinks its arthritis , no radiating pain, no numbness tingling, doesn't hurt just uncomfortable when its bad he isn't able to stand  straight up feels like something is pulling him, aleve and tylenol is the only thing that is keeping him going  ? ?04/20/2021 ?Patient states that his back seems to be doing about the same but the last couple of days that has been cold has been the worst. Patient has been doing the exercises but knows he could be doing it more, patient does states that the manipulation did help at the last visit. ?  ?Relevant Historical Information: History right hip total replacement, hypertension ? ?Additional pertinent review of systems negative. ? ? ?Current Outpatient Medications:  ?  amLODipine (NORVASC) 10 MG tablet, TAKE 1 TABLET BY MOUTH EVERY DAY. Please keep upcoming appt in March 2023 with Dr. Johnsie Cancel before anymore refills. Thank you, Disp: 30 tablet, Rfl: 4 ?  clopidogrel (PLAVIX) 75 MG tablet, Take 75 mg by mouth 2 (two) times a week., Disp: , Rfl:  ?  fluticasone (FLONASE) 50 MCG/ACT nasal spray, Place 2 sprays into both nostrils daily., Disp: 16 g, Rfl: 6 ?  meclizine (ANTIVERT) 12.5 MG tablet, Take 1 tablet (12.5 mg total) by mouth 3 (three) times daily as needed for dizziness., Disp: 40 tablet, Rfl: 1 ?  Multiple Vitamin (MULTIVITAMIN WITH MINERALS) TABS, Take 1 tablet by mouth daily. Centrum silver, Disp: , Rfl:  ?  nitroGLYCERIN (NITROSTAT) 0.4 MG SL tablet, PLACE 1 TABLET UNDER THE TONGUE EVER Y 5 MINUTES AS NEEDED FOR CHEST PAIN (3 DOSES MAXIMUM), Disp: 25 tablet, Rfl: 0 ?  tamsulosin (FLOMAX) 0.4 MG CAPS capsule, Take 0.4 mg by mouth daily., Disp: , Rfl:  ?  triamcinolone (NASACORT) 55 MCG/ACT  AERO nasal inhaler, Place 2 sprays into the nose daily., Disp: 1 each, Rfl: 12  ? ?Objective:   ?  ?Vitals:  ? 04/20/21 1359  ?BP: 130/82  ?Pulse: 95  ?SpO2: 98%  ?Weight: 190 lb (86.2 kg)  ?Height: '6\' 2"'$  (1.88 m)  ?  ?  ?Body mass index is 24.39 kg/m?.  ?  ?Physical Exam:   ? ?General: Well-appearing, cooperative, sitting comfortably in no acute distress.  ? ?OMT Physical Exam: ? ?ASIS Compression Test: Positive  Right ?Sacrum: Positive sphinx, mild TTP left sacral base ?Lumbar: TTP paraspinal, L1-3 RRSL ?Pelvis: Right anterior innominate  ? ? ?Electronically signed by:  ?Eric Dickerson ?[facility] ?3:22 PM 04/20/21 ?

## 2021-04-20 ENCOUNTER — Ambulatory Visit: Payer: Medicare PPO | Admitting: Sports Medicine

## 2021-04-20 ENCOUNTER — Other Ambulatory Visit: Payer: Self-pay

## 2021-04-20 VITALS — BP 130/82 | HR 95 | Ht 74.0 in | Wt 190.0 lb

## 2021-04-20 DIAGNOSIS — M9905 Segmental and somatic dysfunction of pelvic region: Secondary | ICD-10-CM | POA: Diagnosis not present

## 2021-04-20 DIAGNOSIS — M5136 Other intervertebral disc degeneration, lumbar region: Secondary | ICD-10-CM | POA: Diagnosis not present

## 2021-04-20 DIAGNOSIS — G8929 Other chronic pain: Secondary | ICD-10-CM

## 2021-04-20 DIAGNOSIS — M545 Low back pain, unspecified: Secondary | ICD-10-CM

## 2021-04-20 DIAGNOSIS — M9904 Segmental and somatic dysfunction of sacral region: Secondary | ICD-10-CM | POA: Diagnosis not present

## 2021-04-20 DIAGNOSIS — M9903 Segmental and somatic dysfunction of lumbar region: Secondary | ICD-10-CM | POA: Diagnosis not present

## 2021-04-20 NOTE — Patient Instructions (Addendum)
Good to see you  ? ?Follow up in 4 weeks for repeat OMT ?

## 2021-04-26 ENCOUNTER — Other Ambulatory Visit: Payer: Self-pay

## 2021-04-26 ENCOUNTER — Ambulatory Visit: Payer: Medicare PPO | Admitting: Cardiovascular Disease

## 2021-04-26 ENCOUNTER — Encounter: Payer: Self-pay | Admitting: Cardiovascular Disease

## 2021-04-26 VITALS — BP 130/90 | HR 90 | Ht 74.0 in | Wt 190.0 lb

## 2021-04-26 DIAGNOSIS — Z951 Presence of aortocoronary bypass graft: Secondary | ICD-10-CM

## 2021-04-26 DIAGNOSIS — I10 Essential (primary) hypertension: Secondary | ICD-10-CM | POA: Diagnosis not present

## 2021-04-26 DIAGNOSIS — E785 Hyperlipidemia, unspecified: Secondary | ICD-10-CM | POA: Diagnosis not present

## 2021-04-26 MED ORDER — NITROGLYCERIN 0.4 MG SL SUBL: SUBLINGUAL_TABLET | SUBLINGUAL | 3 refills | Status: DC

## 2021-04-26 MED ORDER — ROSUVASTATIN CALCIUM 5 MG PO TABS: 5.0000 mg | ORAL_TABLET | Freq: Every day | ORAL | 3 refills | Status: DC

## 2021-04-26 NOTE — Addendum Note (Signed)
Addended by: Aris Georgia, Jailynne Opperman L on: 04/26/2021 10:22 AM ? ? Modules accepted: Orders ? ?

## 2021-04-26 NOTE — Patient Instructions (Signed)
Medication Instructions:  ?Your physician has recommended you make the following change in your medication: ? ?Take Crestor 5 mg by mouth daily.  ? ?*If you need a refill on your cardiac medications before your next appointment, please call your pharmacy* ? ?Lab Work: ?Your physician recommends that you return for lab work in: 3 months for Lipid and Liver Panel. ? ?If you have labs (blood work) drawn today and your tests are completely normal, you will receive your results only by: ?MyChart Message (if you have MyChart) OR ?A paper copy in the mail ?If you have any lab test that is abnormal or we need to change your treatment, we will call you to review the results. ? ?Testing/Procedures: ?None ordered today. ? ?Follow-Up: ?At [facility], you and your health needs are our priority.  As part of our continuing mission to provide you with exceptional heart care, we have created designated Provider Care Teams.  These Care Teams include your primary Cardiologist (physician) and Advanced Practice Providers (APPs -  Physician Assistants and Nurse Practitioners) who all work together to provide you with the care you need, when you need it. ? ?We recommend signing up for the patient portal called "MyChart".  Sign up information is provided on this After Visit Summary.  MyChart is used to connect with patients for Virtual Visits (Telemedicine).  Patients are able to view lab/test results, encounter notes, upcoming appointments, etc.  Non-urgent messages can be sent to your provider as well.   ?To learn more about what you can do with MyChart, go to [email or web address].   ? ?Your next appointment:   ?12 month(s) ? ?The format for your next appointment:   ?In Person ? ?Provider:   ?Jenkins Rouge, MD { ? ?

## 2021-05-11 ENCOUNTER — Other Ambulatory Visit: Payer: Self-pay | Admitting: Cardiovascular Disease

## 2021-05-17 NOTE — Progress Notes (Signed)
? Eric Dickerson ?[facility] ?[place] ?Phone: [phone] ?  ?Assessment and Plan:   ?  ?1. Chronic pain of right knee ?-Chronic with exacerbation, initial sports medicine visit ?- Acute flare of chronic right knee pain likely due to osteoarthritis and may be contributing to patient's altered mechanics and back discomfort ?- X-ray obtained in clinic.  My interpretation: No acute fracture or dislocation.  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 ?Pertinent previous records reviewed include none ?  ?Follow Up: Patient had appointment scheduled today originally for OMT, however right knee pain was his primary complaint, so was the focus of our treatment today.  We will follow-up in 6 weeks for reevaluation.  Could consider intra-articular knee CSI versus OMT versus continued conservative treatment ?  ?Subjective:   ?I, Pincus Badder, am serving as a Education administrator for [employer] ?  ?Chief Complaint: right sided low back pain  ?  ?HPI:  ?03/30/2021 ?Patient is a 71 year old male complaining of right sided low back pain. Patient states has been stiff for the last 4-5 years , no MOI he thinks its arthritis , no radiating pain, no numbness tingling, doesn't hurt just uncomfortable when its bad he isn't able to stand straight up feels like something is pulling him, aleve and tylenol is the only thing that is keeping him going  ?  ?04/20/2021 ?Patient states that his back seems to  be doing about the same but the last couple of days that has been cold has been the worst. Patient has been doing the exercises but knows he could be doing it more, patient does states that the manipulation did help at the last visit. ? ?05/18/2021 ?Patient states that he's about the same last time,  everyday is just a challenge.  He is able to perform ADLs and the pain is not currently limiting him.  He states that today is more prominent pain in his right knee.  States that he has had right knee pain for years, though it is more significant over the past several weeks without new MOI. ?  ?Relevant Historical Information: History right hip total replacement, hypertension ? ?Additional pertinent review of systems negative. ? ?Current Outpatient Medications  ?Medication Sig Dispense Refill  ? amLODipine (NORVASC) 10 MG tablet TAKE 1 TABLET BY MOUTH DAILY 30 tablet 11  ? clopidogrel (PLAVIX) 75 MG tablet Take 75 mg by mouth 2 (two) times a week.    ? fluticasone (FLONASE) 50 MCG/ACT nasal spray Place 2 sprays into both nostrils daily. 16 g 6  ? meclizine (ANTIVERT) 12.5 MG tablet Take 1 tablet (12.5 mg total) by mouth 3 (three) times daily as needed for dizziness. 40 tablet 1  ? Multiple Vitamin (MULTIVITAMIN WITH MINERALS) TABS Take 1 tablet by mouth daily. Centrum silver    ? nitroGLYCERIN (NITROSTAT) 0.4 MG SL tablet Take 1 tab under your tongue for chest pain  If no relief of pain may repeat NTG, one tab every 5 minutes up to 3 tablets total over 15  minutes. 25 tablet 3  ? rosuvastatin (CRESTOR) 5 MG tablet Take 1 tablet (5 mg total) by mouth daily. 90 tablet 3  ? tamsulosin (FLOMAX) 0.4 MG CAPS capsule Take 0.4 mg by mouth daily.    ? triamcinolone (NASACORT) 55 MCG/ACT AERO nasal inhaler Place 2 sprays into the nose daily. 1 each 12  ? ?No current facility-administered medications for this visit.  ?  ?  ?Objective:   ?  ?Vitals:  ? 05/18/21 1401  ?BP: 110/80  ?Pulse: (!) 43  ?SpO2: 98%  ?Weight: 188 lb (85.3 kg)   ?Height: '6\' 2"'$  (1.88 m)  ?  ?  ?Body mass index is 24.14 kg/m?.  ?  ?Physical Exam:   ?  ?General:  awake, alert oriented, no acute distress nontoxic ?Skin: no suspicious lesions or rashes ?Neuro:sensation intact, no deficits, strength 5/5 with no deficits, no atrophy, normal muscle tone ?Psych: No signs of anxiety, depression or other mood disorder ? ?Knee: ?No swelling ?No deformity ?Neg fluid wave, joint milking ?ROM Flex 90, Ext 15 ?Crepitus ?NTTP over the quad tendon, medial fem condyle, lat fem condyle, patella, plica, patella tendon, tibial tuberostiy, fibular head, posterior fossa, pes anserine bursa, gerdy's tubercle, medial jt line, lateral jt line ?Neg anterior and posterior drawer ?Neg lachman ?Neg sag sign ?Negative varus stress ?Negative valgus stress ?Negative McMurray ? ?Gait normal  ? ?Electronically signed by:  ?Eric Dickerson ?[facility] ?2:55 PM 05/18/21             ? ?

## 2021-05-18 ENCOUNTER — Ambulatory Visit: Payer: Medicare PPO | Admitting: Sports Medicine

## 2021-05-18 ENCOUNTER — Ambulatory Visit (INDEPENDENT_AMBULATORY_CARE_PROVIDER_SITE_OTHER): Payer: Medicare PPO

## 2021-05-18 VITALS — BP 110/80 | HR 43 | Ht 74.0 in | Wt 188.0 lb

## 2021-05-18 DIAGNOSIS — M25561 Pain in right knee: Secondary | ICD-10-CM

## 2021-05-18 DIAGNOSIS — G8929 Other chronic pain: Secondary | ICD-10-CM | POA: Diagnosis not present

## 2021-05-18 NOTE — Patient Instructions (Addendum)
Good to see you  ?Continue Tylenol [phone] mg 2-3 times a day for pain relief  ?6 week follow up  ? ?

## 2021-07-05 ENCOUNTER — Ambulatory Visit: Payer: Medicare PPO | Admitting: Sports Medicine

## 2021-07-20 ENCOUNTER — Other Ambulatory Visit: Payer: Medicare PPO

## 2021-07-20 DIAGNOSIS — E785 Hyperlipidemia, unspecified: Secondary | ICD-10-CM

## 2021-07-20 DIAGNOSIS — Z951 Presence of aortocoronary bypass graft: Secondary | ICD-10-CM

## 2021-07-20 LAB — LIPID PANEL
Chol/HDL Ratio: 3.3 ratio (ref 0.0–5.0)
Cholesterol, Total: 201 mg/dL — ABNORMAL HIGH (ref 100–199)
HDL: 61 mg/dL (ref >39)
LDL Chol Calc (NIH): 130 mg/dL — ABNORMAL HIGH (ref 0–99)
Triglycerides: 54 mg/dL (ref 0–149)
VLDL Cholesterol Cal: 10 mg/dL (ref 5–40)

## 2021-07-20 LAB — HEPATIC FUNCTION PANEL
ALT: 17 IU/L (ref 0–44)
AST: 19 IU/L (ref 0–40)
Albumin: 4.1 g/dL (ref 3.7–4.7)
Alkaline Phosphatase: 88 IU/L (ref 44–121)
Bilirubin Total: 0.2 mg/dL (ref 0.0–1.2)
Bilirubin, Direct: <0.1 mg/dL (ref 0.00–0.40)
Total Protein: 7.1 g/dL (ref 6.0–8.5)

## 2021-10-04 DIAGNOSIS — R3915 Urgency of urination: Secondary | ICD-10-CM | POA: Diagnosis not present

## 2022-02-08 ENCOUNTER — Telehealth: Payer: Self-pay | Admitting: Cardiovascular Disease

## 2022-02-08 NOTE — Telephone Encounter (Signed)
Patient states that last week he dropped off a form for a handicap sticker and he would like to know if it has been filled out.

## 2022-02-09 NOTE — Telephone Encounter (Signed)
Called patient to let him know we do have his form, but it will not be done until 02/15/22 since Dr. Johnsie Cancel is out of the office until then. Patient stated he will pick it up and take it to his PCP. Will leave at front desk.

## 2022-02-09 NOTE — Telephone Encounter (Signed)
Patient has come by the office to drop off papers for Dr.John to fill out for his handicap sticker. Papers will be in providers box at the front.

## 2022-02-13 NOTE — Telephone Encounter (Signed)
Form completed, patient informed and placed at front office for pick up

## 2022-03-13 DIAGNOSIS — Z125 Encounter for screening for malignant neoplasm of prostate: Secondary | ICD-10-CM | POA: Diagnosis not present

## 2022-03-16 DIAGNOSIS — N401 Enlarged prostate with lower urinary tract symptoms: Secondary | ICD-10-CM | POA: Diagnosis not present

## 2022-03-16 DIAGNOSIS — R3915 Urgency of urination: Secondary | ICD-10-CM | POA: Diagnosis not present

## 2022-03-16 DIAGNOSIS — R972 Elevated prostate specific antigen [PSA]: Secondary | ICD-10-CM | POA: Diagnosis not present

## 2022-03-28 ENCOUNTER — Ambulatory Visit: Payer: Medicare PPO | Admitting: Internal Medicine

## 2022-03-28 ENCOUNTER — Other Ambulatory Visit: Payer: Self-pay | Admitting: Internal Medicine

## 2022-03-28 ENCOUNTER — Other Ambulatory Visit: Payer: Self-pay | Admitting: Urology

## 2022-03-28 VITALS — BP 136/88 | HR 89 | Temp 98.5°F | Ht 74.0 in | Wt 185.0 lb

## 2022-03-28 DIAGNOSIS — R7309 Other abnormal glucose: Secondary | ICD-10-CM

## 2022-03-28 DIAGNOSIS — R972 Elevated prostate specific antigen [PSA]: Secondary | ICD-10-CM

## 2022-03-28 DIAGNOSIS — E559 Vitamin D deficiency, unspecified: Secondary | ICD-10-CM | POA: Diagnosis not present

## 2022-03-28 DIAGNOSIS — Z0001 Encounter for general adult medical examination with abnormal findings: Secondary | ICD-10-CM

## 2022-03-28 DIAGNOSIS — R790 Abnormal level of blood mineral: Secondary | ICD-10-CM

## 2022-03-28 DIAGNOSIS — I10 Essential (primary) hypertension: Secondary | ICD-10-CM

## 2022-03-28 DIAGNOSIS — E7800 Pure hypercholesterolemia, unspecified: Secondary | ICD-10-CM

## 2022-03-28 DIAGNOSIS — E538 Deficiency of other specified B group vitamins: Secondary | ICD-10-CM | POA: Diagnosis not present

## 2022-03-28 LAB — BASIC METABOLIC PANEL
BUN: 14 mg/dL (ref 6–23)
CO2: 28 mEq/L (ref 19–32)
Calcium: 10 mg/dL (ref 8.4–10.5)
Chloride: 102 mEq/L (ref 96–112)
Creatinine, Ser: 1.08 mg/dL (ref 0.40–1.50)
GFR: 68.5 mL/min (ref >60.00)
Glucose, Bld: 83 mg/dL (ref 70–99)
Potassium: 3.9 mEq/L (ref 3.5–5.1)
Sodium: 138 mEq/L (ref 135–145)

## 2022-03-28 LAB — CBC WITH DIFFERENTIAL/PLATELET
Basophils Absolute: 0.1 10*3/uL (ref 0.0–0.1)
Basophils Relative: 1.2 % (ref 0.0–3.0)
Eosinophils Absolute: 0 10*3/uL (ref 0.0–0.7)
Eosinophils Relative: 0.6 % (ref 0.0–5.0)
HCT: 39.7 % (ref 39.0–52.0)
Hemoglobin: 13 g/dL (ref 13.0–17.0)
Lymphocytes Relative: 24.6 % (ref 12.0–46.0)
Lymphs Abs: 1.5 10*3/uL (ref 0.7–4.0)
MCHC: 32.8 g/dL (ref 30.0–36.0)
MCV: 85.2 fl (ref 78.0–100.0)
Monocytes Absolute: 0.5 10*3/uL (ref 0.1–1.0)
Monocytes Relative: 8.1 % (ref 3.0–12.0)
Neutro Abs: 4 10*3/uL (ref 1.4–7.7)
Neutrophils Relative %: 65.5 % (ref 43.0–77.0)
Platelets: 273 10*3/uL (ref 150.0–400.0)
RBC: 4.66 Mil/uL (ref 4.22–5.81)
RDW: 14.9 % (ref 11.5–15.5)
WBC: 6.2 10*3/uL (ref 4.0–10.5)

## 2022-03-28 LAB — HEMOGLOBIN A1C: Hgb A1c MFr Bld: 6 % (ref 4.6–6.5)

## 2022-03-28 LAB — URINALYSIS, ROUTINE W REFLEX MICROSCOPIC
Bilirubin Urine: NEGATIVE
Hgb urine dipstick: NEGATIVE
Ketones, ur: NEGATIVE
Leukocytes,Ua: NEGATIVE
Nitrite: NEGATIVE
RBC / HPF: NONE SEEN (ref 0–?)
Specific Gravity, Urine: 1.015 (ref 1.000–1.030)
Total Protein, Urine: NEGATIVE
Urine Glucose: NEGATIVE
Urobilinogen, UA: 0.2 (ref 0.0–1.0)
WBC, UA: NONE SEEN (ref 0–?)
pH: 7 (ref 5.0–8.0)

## 2022-03-28 LAB — HEPATIC FUNCTION PANEL
ALT: 11 U/L (ref 0–53)
AST: 15 U/L (ref 0–37)
Albumin: 4 g/dL (ref 3.5–5.2)
Alkaline Phosphatase: 86 U/L (ref 39–117)
Bilirubin, Direct: 0 mg/dL (ref 0.0–0.3)
Total Bilirubin: 0.3 mg/dL (ref 0.2–1.2)
Total Protein: 7.7 g/dL (ref 6.0–8.3)

## 2022-03-28 LAB — LIPID PANEL
Cholesterol: 226 mg/dL — ABNORMAL HIGH (ref 0–200)
HDL: 56.7 mg/dL (ref >39.00)
LDL Cholesterol: 157 mg/dL — ABNORMAL HIGH (ref 0–99)
NonHDL: 169.63
Total CHOL/HDL Ratio: 4
Triglycerides: 65 mg/dL (ref 0.0–149.0)
VLDL: 13 mg/dL (ref 0.0–40.0)

## 2022-03-28 LAB — VITAMIN B12: Vitamin B-12: 422 pg/mL (ref 211–911)

## 2022-03-28 LAB — MAGNESIUM: Magnesium: 1.9 mg/dL (ref 1.5–2.5)

## 2022-03-28 LAB — VITAMIN D 25 HYDROXY (VIT D DEFICIENCY, FRACTURES): VITD: 30.66 ng/mL (ref 30.00–100.00)

## 2022-03-28 LAB — TSH: TSH: 2.52 u[IU]/mL (ref 0.35–5.50)

## 2022-03-28 MED ORDER — AMLODIPINE BESYLATE 10 MG PO TABS: ORAL_TABLET | ORAL | 3 refills | Status: DC

## 2022-03-28 MED ORDER — EZETIMIBE 10 MG PO TABS: 10.0000 mg | ORAL_TABLET | Freq: Every day | ORAL | 3 refills | Status: DC

## 2022-03-28 NOTE — Patient Instructions (Signed)

## 2022-03-28 NOTE — Progress Notes (Signed)
Patient ID: GEORGIO GO, male   DOB: 05/30/1950, 72 y.o.   MRN: [id number]         Chief Complaint:: wellness exam and elevated psa, low magnesium, hld, htn, hyperglycemia, ,low vit d       HPI:  Eric Dickerson is a 72 y.o. male here for wellness exam; for tdap and shingrix at the pharmacy, decliens pneumovax, o/w up to date                        Also Pt denies chest pain, increased sob or doe, wheezing, orthopnea, PND, increased LE swelling, palpitations, dizziness or syncope.   Pt denies polydipsia, polyuria, or new focal neuro s/s.    Pt denies fever, wt loss, night sweats, loss of appetite, or other constitutional symptoms  Has hx of low magnesium needs lab f/u; Denies urinary symptoms such as dysuria, frequency, urgency, flank pain, hematuria or n/v, fever, chills.  Not taking crestor 5 mg, trying to work on lifestyle and diet.     Wt Readings from Last 3 Encounters:  03/28/22 185 lb (83.9 kg)  05/18/21 188 lb (85.3 kg)  04/26/21 190 lb (86.2 kg)   BP Readings from Last 3 Encounters:  03/28/22 136/88  05/18/21 110/80  04/26/21 130/90   Immunization History  Administered Date(s) Administered   PFIZER(Purple Top)SARS-COV-2 Vaccination 03/30/2019, 04/22/2019   Td 11/15/2009   Health Maintenance Due  Topic Date Due   DTaP/Tdap/Td (2 - Tdap) 11/16/2019      Past Medical History:  Diagnosis Date   ANEMIA, MILD    CAD (coronary artery disease)    a. CAD,  b. s/p Cypher DES to the ramus and Promus DES x 2 to the RCA 08/2007 (minimal disease in LAD and CFX at that time), c. Myoview 8/10: EF 63%, inf thinning, small prior ant infarct, no ischemia;  d. s/p CABG 3/14 (L-LAD, RIMA-OM1)   DEGENERATIVE JOINT DISEASE, RIGHT HIP    DIVERTICULOSIS, COLON    ERECTILE DYSFUNCTION    GERD    Heart murmur    HIATAL HERNIA    HTN (hypertension)    Hyperlipidemia    statin intolerant   HYPERPLASIA PROSTATE UNS W/O UR OBST & OTH LUTS    LUMBAR RADICULOPATHY, RIGHT    Other abnormal glucose     PAD (peripheral artery disease) ([place])    pre CABG 04/2012 => ABIs: Right 0.92, left 0.61 // ABI 09/02/14: R 0.93; L 0.82   SPINAL STENOSIS, LUMBAR    Past Surgical History:  Procedure Laterality Date   COLONOSCOPY  2002   negative   CORONARY ARTERY BYPASS GRAFT N/A 04/22/2012   Procedure: CORONARY ARTERY BYPASS GRAFTING (CABG);  Surgeon: [name], MD;  Location: [facility];  Service: Open Heart Surgery;  Laterality: N/A;  CABG X 2, BIMA, POSSIBLE EVH   CORONARY STENT PLACEMENT  2010    X 2   TEE WITHOUT CARDIOVERSION N/A 04/22/2012   Procedure: TRANSESOPHAGEAL ECHOCARDIOGRAM (TEE);  Surgeon: [name], MD;  Location: [facility];  Service: Open Heart Surgery;  Laterality: N/A;   TOTAL HIP ARTHROPLASTY  2009    reports that he quit smoking about 37 years ago. His smoking use included cigarettes. He has a 18.00 pack-year smoking history. He has never used smokeless tobacco. He reports that he does not drink alcohol and does not use drugs. family history includes Diabetes in his maternal grandmother; Heart attack (age of onset: 88) in  his father; Heart attack (age of onset: 65) in his brother; Hyperlipidemia in his brother and mother; Hypertension in his father; Pulmonary embolism in his brother; Stroke in his brother. Allergies  Allergen Reactions   Olmesartan Medoxomil Rash   Quinapril Hcl Other (See Comments)     cough; ACE inhibitors are contraindicated because of a history of rash with Angiotensin receptor blocker.   Glucosamine Other (See Comments)    Doesn't remember reaction   Livalo [Pitavastatin]     cramps   Rosuvastatin Other (See Comments)    : muscle cramps   Verapamil Other (See Comments)    Extra sensitive to sun   Current Outpatient Medications on File Prior to Visit  Medication Sig Dispense Refill   clopidogrel (PLAVIX) 75 MG tablet Take 75 mg by mouth 2 (two) times a week.     fluticasone (FLONASE) 50 MCG/ACT nasal spray Place 2 sprays into both nostrils  daily. 16 g 6   Multiple Vitamin (MULTIVITAMIN WITH MINERALS) TABS Take 1 tablet by mouth daily. Centrum silver     nitroGLYCERIN (NITROSTAT) 0.4 MG SL tablet Take 1 tab under your tongue for chest pain  If no relief of pain may repeat NTG, one tab every 5 minutes up to 3 tablets total over 15 minutes. 25 tablet 3   tamsulosin (FLOMAX) 0.4 MG CAPS capsule Take 0.4 mg by mouth daily.     triamcinolone (NASACORT) 55 MCG/ACT AERO nasal inhaler Place 2 sprays into the nose daily. 1 each 12   No current facility-administered medications on file prior to visit.        ROS:  All others reviewed and negative.  Objective        PE:  BP 136/88 (BP Location: Left Arm, Patient Position: Sitting, Cuff Size: Large)   Pulse 89   Temp 98.5 F (36.9 C) (Oral)   Ht '6\' 2"'$  (1.88 m)   Wt 185 lb (83.9 kg)   SpO2 98%   BMI 23.75 kg/m                 Constitutional: Pt appears in NAD               HENT: Head: NCAT.                Right Ear: External ear normal.                 Left Ear: External ear normal.                Eyes: . Pupils are equal, round, and reactive to light. Conjunctivae and EOM are normal               Nose: without d/c or deformity               Neck: Neck supple. Gross normal ROM               Cardiovascular: Normal rate and regular rhythm.                 Pulmonary/Chest: Effort normal and breath sounds without rales or wheezing.                Abd:  Soft, NT, ND, + BS, no organomegaly               Neurological: Pt is alert. At baseline orientation, motor grossly intact               Skin: Skin  is warm. No rashes, no other new lesions, LE edema - none               Psychiatric: Pt behavior is normal without agitation   Micro: none  Cardiac tracings I have personally interpreted today:  none  Pertinent Radiological findings (summarize): none   Lab Results  Component Value Date   WBC 6.2 03/28/2022   HGB 13.0 03/28/2022   HCT 39.7 03/28/2022   PLT 273.0 03/28/2022    GLUCOSE 83 03/28/2022   CHOL 226 (H) 03/28/2022   TRIG 65.0 03/28/2022   HDL 56.70 03/28/2022   LDLDIRECT 130.4 11/17/2009   LDLCALC 157 (H) 03/28/2022   ALT 11 03/28/2022   AST 15 03/28/2022   NA 138 03/28/2022   K 3.9 03/28/2022   CL 102 03/28/2022   CREATININE 1.08 03/28/2022   BUN 14 03/28/2022   CO2 28 03/28/2022   TSH 2.52 03/28/2022   PSA 6.55 (H) 03/24/2021   INR 1.28 04/22/2012   HGBA1C 6.0 03/28/2022   Assessment/Plan:  Eric Dickerson is a 72 y.o. Black or African American [2] male with  has a past medical history of ANEMIA, MILD, CAD (coronary artery disease), DEGENERATIVE JOINT DISEASE, RIGHT HIP, DIVERTICULOSIS, COLON, ERECTILE DYSFUNCTION, GERD, Heart murmur, HIATAL HERNIA, HTN (hypertension), Hyperlipidemia, HYPERPLASIA PROSTATE UNS W/O UR OBST & OTH LUTS, LUMBAR RADICULOPATHY, RIGHT, Other abnormal glucose, PAD (peripheral artery disease) ([place]), and SPINAL STENOSIS, LUMBAR.  Encounter for well adult exam with abnormal findings Age and sex appropriate education and counseling updated with regular exercise and diet Referrals for preventative services - none needed Immunizations addressed - for tdap and shingrix at pharmacy, delcines pneumovax Smoking counseling  - none needed Evidence for depression or other mood disorder - none significant Most recent labs reviewed. I have personally reviewed and have noted: 1) the patient's medical and social history 2) The patient's current medications and supplements 3) The patient's height, weight, and BMI have been recorded in the chart   Elevated PSA Had recent PSA approx 5 per pt at urology, has MRI scheduled  HTN (hypertension) BP Readings from Last 3 Encounters:  03/28/22 136/88  05/18/21 110/80  04/26/21 130/90   Stable, pt to continue medical treatment norvasc 10 mg qd   Hyperlipidemia Lab Results  Component Value Date   LDLCALC 157 (H) 03/28/2022   Uncontrolled, pt to start zetia 10 mg qd, declines  statin   Other abnormal glucose Lab Results  Component Value Date   HGBA1C 6.0 03/28/2022   Stable, pt to continue current medical treatment  - diet, wt control  Vitamin D deficiency Last vitamin D Lab Results  Component Value Date   VD25OH 30.66 03/28/2022   Low, to start oral replacement   Low magnesium levels Also for f/u magnesium level  Followup: Return in about 1 year (around 03/29/2023).  Eric Cower, MD 04/01/2022 3:50 PM [facility]

## 2022-03-31 ENCOUNTER — Other Ambulatory Visit (HOSPITAL_COMMUNITY): Payer: Self-pay | Admitting: Urology

## 2022-03-31 DIAGNOSIS — R972 Elevated prostate specific antigen [PSA]: Secondary | ICD-10-CM

## 2022-04-01 ENCOUNTER — Encounter: Payer: Self-pay | Admitting: Internal Medicine

## 2022-04-01 DIAGNOSIS — R790 Abnormal level of blood mineral: Secondary | ICD-10-CM | POA: Insufficient documentation

## 2022-04-01 NOTE — Assessment & Plan Note (Signed)
Lab Results  Component Value Date   LDLCALC 157 (H) 03/28/2022   Uncontrolled, pt to start zetia 10 mg qd, declines statin

## 2022-04-01 NOTE — Assessment & Plan Note (Signed)
Lab Results  Component Value Date   HGBA1C 6.0 03/28/2022   Stable, pt to continue current medical treatment  - diet, wt control

## 2022-04-01 NOTE — Assessment & Plan Note (Signed)
Also for f/u magnesium level

## 2022-04-01 NOTE — Assessment & Plan Note (Signed)
Had recent PSA approx 5 per pt at urology, has MRI scheduled

## 2022-04-01 NOTE — Assessment & Plan Note (Signed)
BP Readings from Last 3 Encounters:  03/28/22 136/88  05/18/21 110/80  04/26/21 130/90   Stable, pt to continue medical treatment norvasc 10 mg qd

## 2022-04-01 NOTE — Assessment & Plan Note (Signed)
Last vitamin D Lab Results  Component Value Date   VD25OH 30.66 03/28/2022   Low, to start oral replacement

## 2022-04-01 NOTE — Assessment & Plan Note (Signed)
Age and sex appropriate education and counseling updated with regular exercise and diet Referrals for preventative services - none needed Immunizations addressed - for tdap and shingrix at pharmacy, delcines pneumovax Smoking counseling  - none needed Evidence for depression or other mood disorder - none significant Most recent labs reviewed. I have personally reviewed and have noted: 1) the patient's medical and social history 2) The patient's current medications and supplements 3) The patient's height, weight, and BMI have been recorded in the chart

## 2022-04-12 ENCOUNTER — Ambulatory Visit (HOSPITAL_COMMUNITY)
Admission: RE | Admit: 2022-04-12 | Discharge: 2022-04-12 | Disposition: A | Discharge status: Home / Self Care | Payer: Medicare PPO | Source: Ambulatory Visit | Attending: Urology | Admitting: Urology

## 2022-04-12 DIAGNOSIS — R972 Elevated prostate specific antigen [PSA]: Secondary | ICD-10-CM

## 2022-04-12 MED ORDER — GADOBUTROL 1 MMOL/ML IV SOLN
8.0000 mL | Freq: Once | INTRAVENOUS | Status: AC | PRN
  Administered 2022-04-12: 8 mL via INTRAVENOUS

## 2022-04-26 DIAGNOSIS — N401 Enlarged prostate with lower urinary tract symptoms: Secondary | ICD-10-CM | POA: Diagnosis not present

## 2022-04-26 DIAGNOSIS — R972 Elevated prostate specific antigen [PSA]: Secondary | ICD-10-CM | POA: Diagnosis not present

## 2022-04-26 DIAGNOSIS — R3915 Urgency of urination: Secondary | ICD-10-CM | POA: Diagnosis not present

## 2022-05-29 NOTE — Progress Notes (Signed)
Cardiology Office Note   Date:  06/05/2022   ID:  Eric Dickerson, DOB Nov 01, 1950, MRN [id number]  PCP:  Eric Levins, MD  Cardiologist:  Dr. Eden Emms    No chief complaint on file.     History of Present Illness: Eric Dickerson is a 72 y.o. male who presents for f/u CAD   He has a hx of CAD, s/p Cypher DES to the ramus and Promus DES x 2 to the RCA 08/2007 (minimal disease in LAD and CFX at that time), PAD, HL, HTN.  He developed progressively worsening chest pain in 3/14 and LHC demonstrated 3 v CAD.  He was referred for CABG by Dr. Dorris Fetch 04/22/12 (LIMA-LAD, free RIMA-OM1).      Cannot take asa due to GERD,  So has been maintained on plavix   Beta blocker d/c due to perception that it worsened his back pain and made him urinate more frequently    Seen by Lipid clinic and defers PSK9 or increasing crestor dose to achieve target LDL Primary d/c crestor and now on Zetia since 03/28/22 LDL prior to starting Rx was elevated at 157     He is still working at [employer]   Still having issues with back and right hip that [place] replaced Seeing sports medicine for this   No angina Needs new nitro   Past Medical History:  Diagnosis Date   ANEMIA, MILD    CAD (coronary artery disease)    a. CAD,  b. s/p Cypher DES to the ramus and Promus DES x 2 to the RCA 08/2007 (minimal disease in LAD and CFX at that time), c. Myoview 8/10: EF 63%, inf thinning, small prior ant infarct, no ischemia;  d. s/p CABG 3/14 (L-LAD, RIMA-OM1)   DEGENERATIVE JOINT DISEASE, RIGHT HIP    DIVERTICULOSIS, COLON    ERECTILE DYSFUNCTION    GERD    Heart murmur    HIATAL HERNIA    HTN (hypertension)    Hyperlipidemia    statin intolerant   HYPERPLASIA PROSTATE UNS W/O UR OBST & OTH LUTS    LUMBAR RADICULOPATHY, RIGHT    Other abnormal glucose    PAD (peripheral artery disease) (HCC)    pre CABG 04/2012 => ABIs: Right 0.92, left 0.61 // ABI 09/02/14: R 0.93; L 0.82   SPINAL STENOSIS, LUMBAR     Past Surgical  History:  Procedure Laterality Date   COLONOSCOPY  2002   negative   CORONARY ARTERY BYPASS GRAFT N/A 04/22/2012   Procedure: CORONARY ARTERY BYPASS GRAFTING (CABG);  Surgeon: Loreli Slot, MD;  Location: [facility] OR;  Service: Open Heart Surgery;  Laterality: N/A;  CABG X 2, BIMA, POSSIBLE EVH   CORONARY STENT PLACEMENT  2010    X 2   TEE WITHOUT CARDIOVERSION N/A 04/22/2012   Procedure: TRANSESOPHAGEAL ECHOCARDIOGRAM (TEE);  Surgeon: Loreli Slot, MD;  Location: [facility] OR;  Service: Open Heart Surgery;  Laterality: N/A;   TOTAL HIP ARTHROPLASTY  2009     Current Outpatient Medications  Medication Sig Dispense Refill   amLODipine (NORVASC) 10 MG tablet TAKE 1 TABLET BY MOUTH DAILY 90 tablet 3   ezetimibe (ZETIA) 10 MG tablet Take 1 tablet (10 mg total) by mouth daily. 90 tablet 3   fluticasone (FLONASE) 50 MCG/ACT nasal spray Place 2 sprays into both nostrils daily. 16 g 6   Multiple Vitamin (MULTIVITAMIN WITH MINERALS) TABS Take 1 tablet by mouth daily. Centrum silver     nitroGLYCERIN (  NITROSTAT) 0.4 MG SL tablet Take 1 tab under your tongue for chest pain  If no relief of pain may repeat NTG, one tab every 5 minutes up to 3 tablets total over 15 minutes. 25 tablet 3   tamsulosin (FLOMAX) 0.4 MG CAPS capsule Take 0.4 mg by mouth daily.     triamcinolone (NASACORT) 55 MCG/ACT AERO nasal inhaler Place 2 sprays into the nose daily. 1 each 12   No current facility-administered medications for this visit.    Allergies:   Olmesartan medoxomil, Quinapril hcl, Glucosamine, Livalo [pitavastatin], Rosuvastatin, and Verapamil    Social History:  The patient  reports that he quit smoking about 37 years ago. His smoking use included cigarettes. He has a 18.00 pack-year smoking history. He has never used smokeless tobacco. He reports that he does not drink alcohol and does not use drugs.   Family History:  The patient's family history includes Diabetes in his maternal grandmother; Heart  attack (age of onset: 61) in his father; Heart attack (age of onset: 66) in his brother; Hyperlipidemia in his brother and mother; Hypertension in his father; Pulmonary embolism in his brother; Stroke in his brother.    ROS:  General:no colds or fevers, no weight changes CV:see HPI PUL:see HPI GI:no diarrhea constipation or melena, no indigestion  Wt Readings from Last 3 Encounters:  06/05/22 182 lb 3.2 oz (82.6 kg)  03/28/22 185 lb (83.9 kg)  05/18/21 188 lb (85.3 kg)     PHYSICAL EXAM: VS:  BP (!) 144/86   Pulse 80   Ht 6\' 2"  (1.88 m)   Wt 182 lb 3.2 oz (82.6 kg)   SpO2 99%   BMI 23.39 kg/m  , BMI Body mass index is 23.39 kg/m.  Affect appropriate Healthy:  appears stated age HEENT: normal Neck supple with no adenopathy JVP normal no bruits no thyromegaly Lungs clear with no wheezing and good diaphragmatic motion Heart:  S1/S2 no murmur, no rub, gallop or click PMI normal post sternotomy  Abdomen: benighn, BS positve, no tenderness, no AAA no bruit.  No HSM or HJR Distal pulses intact with no bruits No edema Neuro non-focal Skin warm and dry No muscular weakness   EKG:  06/05/2022 NSR rate 80 RBBB old    Recent Labs: 03/28/2022: ALT 11; BUN 14; Creatinine, Ser 1.08; Hemoglobin 13.0; Magnesium 1.9; Platelets 273.0; Potassium 3.9; Sodium 138; TSH 2.52    Lipid Panel    Component Value Date/Time   CHOL 226 (H) 03/28/2022 1440   CHOL 201 (H) 07/20/2021 0914   TRIG 65.0 03/28/2022 1440   HDL 56.70 03/28/2022 1440   HDL 61 07/20/2021 0914   CHOLHDL 4 03/28/2022 1440   VLDL 13.0 03/28/2022 1440   LDLCALC 157 (H) 03/28/2022 1440   LDLCALC 130 (H) 07/20/2021 0914   LDLDIRECT 130.4 11/17/2009 0853       Other studies Reviewed: Additional studies/ records that were reviewed today include: last OV notes.   ASSESSMENT AND PLAN:  1.  CAD with CABG in 2014, no chest pain, on plavix due to intolerance to ASA No beta blocker due to side effects stable I told him  if pulse stays high or BP up  To call us and we would try alternative beta blocker   2.  HLD  Has been intolerant to livalo and crestor due to leg cramps  Now on Zetia but should be on PSK9 or nexlizet at least will check labs in 3 months and have him f/u  in lipid clinic He will not likely reach target on just Zetia and encouraged him to consider PSK9 or Nexlizet  3.  HTN on amlodipine stable.    4. Elevated PSA:  fu urology MRI done 04/13/22 sub optimal due to artifact from [facility]  Current medicines are reviewed with the patient today.  The patient Has no concerns regarding medicines.   Lipid clinic with labs 3 months   Disposition:   FU:   in a year   Signed, Charlton Haws, MD  06/05/2022 8:43 AM    [facility] [address] [place], [place], [place]  [address]/ [address] [place], [place] Phone: [phone]; Fax: [phone]  [phone]

## 2022-06-05 ENCOUNTER — Ambulatory Visit: Payer: Medicare PPO | Attending: Cardiovascular Disease | Admitting: Cardiovascular Disease

## 2022-06-05 ENCOUNTER — Encounter: Payer: Self-pay | Admitting: Cardiovascular Disease

## 2022-06-05 VITALS — BP 144/86 | HR 80 | Ht 74.0 in | Wt 182.2 lb

## 2022-06-05 DIAGNOSIS — Z951 Presence of aortocoronary bypass graft: Secondary | ICD-10-CM

## 2022-06-05 DIAGNOSIS — I10 Essential (primary) hypertension: Secondary | ICD-10-CM

## 2022-06-05 DIAGNOSIS — E785 Hyperlipidemia, unspecified: Secondary | ICD-10-CM

## 2022-06-05 NOTE — Patient Instructions (Signed)
Medication Instructions:  Your physician recommends that you continue on your current medications as directed. Please refer to the Current Medication list given to you today.  *If you need a refill on your cardiac medications before your next appointment, please call your pharmacy*  Lab Work: Your physician recommends that you return for lab work in: 3 months for fasting lipid and liver.  If you have labs (blood work) drawn today and your tests are completely normal, you will receive your results only by: MyChart Message (if you have MyChart) OR A paper copy in the mail If you have any lab test that is abnormal or we need to change your treatment, we will call you to review the results.  Testing: None ordered today.  Follow-Up: At [facility], you and your health needs are our priority.  As part of our continuing mission to provide you with exceptional heart care, we have created designated Provider Care Teams.  These Care Teams include your primary Cardiologist (physician) and Advanced Practice Providers (APPs -  Physician Assistants and Nurse Practitioners) who all work together to provide you with the care you need, when you need it.  We recommend signing up for the patient portal called "MyChart".  Sign up information is provided on this After Visit Summary.  MyChart is used to connect with patients for Virtual Visits (Telemedicine).  Patients are able to view lab/test results, encounter notes, upcoming appointments, etc.  Non-urgent messages can be sent to your provider as well.   To learn more about what you can do with MyChart, go to [email or web address].    Your next appointment:   1 year(s)  Provider:   Charlton Haws, MD     Other Instructions Your physician recommends that you schedule a follow-up appointment in: 3 with our pharmacist for lipid clinic.

## 2022-06-09 ENCOUNTER — Encounter: Payer: Self-pay | Admitting: Gastroenterology

## 2022-07-07 DIAGNOSIS — R8279 Other abnormal findings on microbiological examination of urine: Secondary | ICD-10-CM | POA: Diagnosis not present

## 2022-07-07 DIAGNOSIS — N3000 Acute cystitis without hematuria: Secondary | ICD-10-CM | POA: Diagnosis not present

## 2022-07-21 DIAGNOSIS — N401 Enlarged prostate with lower urinary tract symptoms: Secondary | ICD-10-CM | POA: Diagnosis not present

## 2022-07-21 DIAGNOSIS — R3915 Urgency of urination: Secondary | ICD-10-CM | POA: Diagnosis not present

## 2022-08-01 ENCOUNTER — Ambulatory Visit (AMBULATORY_SURGERY_CENTER): Payer: Medicare PPO

## 2022-08-01 VITALS — Ht 74.0 in | Wt 178.0 lb

## 2022-08-01 DIAGNOSIS — Z86010 Personal history of colonic polyps: Secondary | ICD-10-CM

## 2022-08-01 MED ORDER — NA SULFATE-K SULFATE-MG SULF 17.5-3.13-1.6 GM/177ML PO SOLN: 1.0000 | Freq: Once | ORAL | 0 refills | Status: AC

## 2022-08-01 NOTE — Progress Notes (Signed)
No egg  known to patient   No issues known to pt with past sedation  with any surgeries or procedures  Patient denies ever being told they had issues or difficulty with intubation   No FH of Malignant Hyperthermia  Pt is not on diet pills  Pt is not on  home 02  Pt is not on blood thinners   Pt denies issues with constipation   No A fib or A flutter  Have any cardiac testing pending--no  Pt instructed to use Singlecare.com or GoodRx for a price reduction on prep    No chart check by JN as of yet.

## 2022-08-02 ENCOUNTER — Encounter: Payer: Self-pay | Admitting: Gastroenterology

## 2022-08-07 ENCOUNTER — Ambulatory Visit: Payer: Medicare PPO | Admitting: Internal Medicine

## 2022-08-07 ENCOUNTER — Encounter: Payer: Self-pay | Admitting: Internal Medicine

## 2022-08-07 VITALS — BP 122/82 | HR 73 | Temp 98.3°F | Ht 74.0 in | Wt 179.0 lb

## 2022-08-07 DIAGNOSIS — J309 Allergic rhinitis, unspecified: Secondary | ICD-10-CM

## 2022-08-07 DIAGNOSIS — E559 Vitamin D deficiency, unspecified: Secondary | ICD-10-CM | POA: Diagnosis not present

## 2022-08-07 DIAGNOSIS — R42 Dizziness and giddiness: Secondary | ICD-10-CM | POA: Diagnosis not present

## 2022-08-07 DIAGNOSIS — R7309 Other abnormal glucose: Secondary | ICD-10-CM | POA: Diagnosis not present

## 2022-08-07 DIAGNOSIS — I10 Essential (primary) hypertension: Secondary | ICD-10-CM

## 2022-08-07 MED ORDER — CETIRIZINE HCL 10 MG PO TABS: 10.0000 mg | ORAL_TABLET | Freq: Every day | ORAL | 11 refills | Status: DC

## 2022-08-07 MED ORDER — MECLIZINE HCL 12.5 MG PO TABS: 12.5000 mg | ORAL_TABLET | Freq: Three times a day (TID) | ORAL | 1 refills | Status: AC | PRN

## 2022-08-07 NOTE — Assessment & Plan Note (Signed)
Lab Results  Component Value Date   HGBA1C 6.0 03/28/2022   Stable, pt to continue current medical treatment  - diet, wt control 

## 2022-08-07 NOTE — Assessment & Plan Note (Signed)
Last vitamin D Lab Results  Component Value Date   VD25OH 30.66 03/28/2022   Low, to start oral replacement  

## 2022-08-07 NOTE — Progress Notes (Signed)
Patient ID: RENARDO BONOMO, male   DOB: 20-Jun-1950, 72 y.o.   MRN: [id number]        Chief Complaint: follow up vertigo, htn, hyperglycemia, allergies       HPI:  IZIAH ASHBY is a 72 y.o. male Does have several wks ongoing nasal allergy symptoms with clearish congestion, itch and sneezing, without fever, pain, ST, cough, swelling or wheezing, but has mild intermittent right ear fullness and popping, as well as vertigo like dizziness with position change, worst last night to get up to the BR, but little to none today.  Pt denies chest pain, increased sob or doe, wheezing, orthopnea, PND, increased LE swelling, palpitations, dizziness or syncope.   Pt denies polydipsia, polyuria, or new focal neuro s/s.          Wt Readings from Last 3 Encounters:  08/07/22 179 lb (81.2 kg)  08/01/22 178 lb (80.7 kg)  06/05/22 182 lb 3.2 oz (82.6 kg)   BP Readings from Last 3 Encounters:  08/07/22 122/82  06/05/22 (!) 144/86  03/28/22 136/88         Past Medical History:  Diagnosis Date   ANEMIA, MILD    CAD (coronary artery disease)    a. CAD,  b. s/p Cypher DES to the ramus and Promus DES x 2 to the RCA 08/2007 (minimal disease in LAD and CFX at that time), c. Myoview 8/10: EF 63%, inf thinning, small prior ant infarct, no ischemia;  d. s/p CABG 3/14 (L-LAD, RIMA-OM1)   DEGENERATIVE JOINT DISEASE, RIGHT HIP    DIVERTICULOSIS, COLON    ERECTILE DYSFUNCTION    GERD    Heart murmur    HIATAL HERNIA    HTN (hypertension)    Hyperlipidemia    statin intolerant   HYPERPLASIA PROSTATE UNS W/O UR OBST & OTH LUTS    LUMBAR RADICULOPATHY, RIGHT    Other abnormal glucose    PAD (peripheral artery disease) (HCC)    pre CABG 04/2012 => ABIs: Right 0.92, left 0.61 // ABI 09/02/14: R 0.93; L 0.82   SPINAL STENOSIS, LUMBAR    Past Surgical History:  Procedure Laterality Date   COLONOSCOPY  02/07/2000   negative   COLONOSCOPY  08/2017   [place], Dr Myrtie Neither   CORONARY ARTERY BYPASS GRAFT N/A 04/22/2012   Procedure:  CORONARY ARTERY BYPASS GRAFTING (CABG);  Surgeon: Loreli Slot, MD;  Location: [facility] OR;  Service: Open Heart Surgery;  Laterality: N/A;  CABG X 2, BIMA, POSSIBLE EVH   CORONARY STENT PLACEMENT  02/07/2008    X 2   TEE WITHOUT CARDIOVERSION N/A 04/22/2012   Procedure: TRANSESOPHAGEAL ECHOCARDIOGRAM (TEE);  Surgeon: Loreli Slot, MD;  Location: [facility] OR;  Service: Open Heart Surgery;  Laterality: N/A;   TOTAL HIP ARTHROPLASTY Right 02/07/2007   UPPER GASTROINTESTINAL ENDOSCOPY      reports that he quit smoking about 37 years ago. His smoking use included cigarettes. He has a 18.00 pack-year smoking history. He has never used smokeless tobacco. He reports that he does not drink alcohol and does not use drugs. family history includes Diabetes in his maternal grandmother; Heart attack (age of onset: 83) in his father; Heart attack (age of onset: 83) in his brother; Hyperlipidemia in his brother and mother; Hypertension in his father; Pulmonary embolism in his brother; Stroke in his brother. Allergies  Allergen Reactions   Olmesartan Medoxomil Rash   Quinapril Hcl Other (See Comments)     cough; ACE inhibitors are contraindicated  because of a history of rash with Angiotensin receptor blocker.   Glucosamine Other (See Comments)    Doesn't remember reaction   Livalo [Pitavastatin]     cramps   Rosuvastatin Other (See Comments)    : muscle cramps   Verapamil Other (See Comments)    Extra sensitive to sun   Current Outpatient Medications on File Prior to Visit  Medication Sig Dispense Refill   amLODipine (NORVASC) 10 MG tablet TAKE 1 TABLET BY MOUTH DAILY 90 tablet 3   fluticasone (FLONASE) 50 MCG/ACT nasal spray Place 2 sprays into both nostrils daily. (Patient taking differently: Place 2 sprays into both nostrils daily as needed.) 16 g 6   Multiple Vitamin (MULTIVITAMIN WITH MINERALS) TABS Take 1 tablet by mouth daily. Centrum silver     Na Sulfate-K Sulfate-Mg Sulf 17.5-3.13-1.6  GM/177ML SOLN SMARTSIG:1 Kit(s) By Mouth Once     nitroGLYCERIN (NITROSTAT) 0.4 MG SL tablet Take 1 tab under your tongue for chest pain  If no relief of pain may repeat NTG, one tab every 5 minutes up to 3 tablets total over 15 minutes. 25 tablet 3   tamsulosin (FLOMAX) 0.4 MG CAPS capsule Take 0.4 mg by mouth daily.     ezetimibe (ZETIA) 10 MG tablet Take 1 tablet (10 mg total) by mouth daily. (Patient not taking: Reported on 08/07/2022) 90 tablet 3   triamcinolone (NASACORT) 55 MCG/ACT AERO nasal inhaler Place 2 sprays into the nose daily. (Patient not taking: Reported on 08/07/2022) 1 each 12   No current facility-administered medications on file prior to visit.        ROS:  All others reviewed and negative.  Objective        PE:  BP 122/82 (BP Location: Left Arm, Patient Position: Sitting, Cuff Size: Normal)   Pulse 73   Temp 98.3 F (36.8 C) (Oral)   Ht 6\' 2"  (1.88 m)   Wt 179 lb (81.2 kg)   SpO2 99%   BMI 22.98 kg/m                 Constitutional: Pt appears in NAD               HENT: Head: NCAT.                Right Ear: External ear normal.                 Left Ear: External ear normal. Bilat tm's with mild erythema.  Max sinus areas non tender.  Pharynx with mild erythema, no exudate               Eyes: . Pupils are equal, round, and reactive to light. Conjunctivae and EOM are normal               Nose: without d/c or deformity               Neck: Neck supple. Gross normal ROM               Cardiovascular: Normal rate and regular rhythm.                 Pulmonary/Chest: Effort normal and breath sounds without rales or wheezing.                Abd:  Soft, NT, ND, + BS, no organomegaly               Neurological: Pt is alert. At baseline orientation, motor  grossly intact               Skin: Skin is warm. No rashes, no other new lesions, LE edema - none               Psychiatric: Pt behavior is normal without agitation   Micro: none  Cardiac tracings I have personally  interpreted today:  none  Pertinent Radiological findings (summarize): none   Lab Results  Component Value Date   WBC 6.2 03/28/2022   HGB 13.0 03/28/2022   HCT 39.7 03/28/2022   PLT 273.0 03/28/2022   GLUCOSE 83 03/28/2022   CHOL 226 (H) 03/28/2022   TRIG 65.0 03/28/2022   HDL 56.70 03/28/2022   LDLDIRECT 130.4 11/17/2009   LDLCALC 157 (H) 03/28/2022   ALT 11 03/28/2022   AST 15 03/28/2022   NA 138 03/28/2022   K 3.9 03/28/2022   CL 102 03/28/2022   CREATININE 1.08 03/28/2022   BUN 14 03/28/2022   CO2 28 03/28/2022   TSH 2.52 03/28/2022   PSA 6.55 (H) 03/24/2021   INR 1.28 04/22/2012   HGBA1C 6.0 03/28/2022   Assessment/Plan:  SENAN LANDSIEDEL is a 72 y.o. Black or African American [2] male with  has a past medical history of ANEMIA, MILD, CAD (coronary artery disease), DEGENERATIVE JOINT DISEASE, RIGHT HIP, DIVERTICULOSIS, COLON, ERECTILE DYSFUNCTION, GERD, Heart murmur, HIATAL HERNIA, HTN (hypertension), Hyperlipidemia, HYPERPLASIA PROSTATE UNS W/O UR OBST & OTH LUTS, LUMBAR RADICULOPATHY, RIGHT, Other abnormal glucose, PAD (peripheral artery disease) (HCC), and SPINAL STENOSIS, LUMBAR.  HTN (hypertension) BP Readings from Last 3 Encounters:  08/07/22 122/82  06/05/22 (!) 144/86  03/28/22 136/88   Stable, pt to continue medical treatment norvasc 10 qd   Other abnormal glucose Lab Results  Component Value Date   HGBA1C 6.0 03/28/2022   Stable, pt to continue current medical treatment  - diet, wt control   Vertigo Mild to mod, for meclizine prn, to f/u any worsening symptoms or concerns  Allergic rhinitis Mild to mod, for oct zyrtec and nasacort asd,  to f/u any worsening symptoms or concerns  Vitamin D deficiency Last vitamin D Lab Results  Component Value Date   VD25OH 30.66 03/28/2022   Low, to start oral replacement   Followup: Return if symptoms worsen or fail to improve.  Oliver Barre, MD 08/07/2022 7:43 PM [facility]  Primary  Care - [facility]

## 2022-08-07 NOTE — Patient Instructions (Signed)
Please take all new medication as prescribed- the meclizine as needed for dizziness, and Zyrtec for allergies  You can also take OTC Nasacort for allergies as well  Please continue all other medications as before, and refills have been done if requested.  Please have the pharmacy call with any other refills you may need.  Please keep your appointments with your specialists as you may have planned

## 2022-08-07 NOTE — Assessment & Plan Note (Signed)
BP Readings from Last 3 Encounters:  08/07/22 122/82  06/05/22 (!) 144/86  03/28/22 136/88   Stable, pt to continue medical treatment norvasc 10 qd

## 2022-08-07 NOTE — Assessment & Plan Note (Signed)
Mild to mod, for oct zyrtec and nasacort asd,  to f/u any worsening symptoms or concerns

## 2022-08-07 NOTE — Assessment & Plan Note (Signed)
Mild to mod, for meclizine prn,  to f/u any worsening symptoms or concerns 

## 2022-08-15 ENCOUNTER — Ambulatory Visit (AMBULATORY_SURGERY_CENTER): Payer: Medicare PPO | Admitting: Gastroenterology

## 2022-08-15 ENCOUNTER — Encounter: Payer: Self-pay | Admitting: Gastroenterology

## 2022-08-15 VITALS — BP 122/78 | HR 61 | Temp 98.9°F | Resp 21 | Ht 74.0 in | Wt 178.0 lb

## 2022-08-15 DIAGNOSIS — Z86010 Personal history of colonic polyps: Secondary | ICD-10-CM | POA: Diagnosis not present

## 2022-08-15 DIAGNOSIS — Z1211 Encounter for screening for malignant neoplasm of colon: Secondary | ICD-10-CM | POA: Diagnosis not present

## 2022-08-15 DIAGNOSIS — Z09 Encounter for follow-up examination after completed treatment for conditions other than malignant neoplasm: Secondary | ICD-10-CM | POA: Diagnosis not present

## 2022-08-15 MED ORDER — SODIUM CHLORIDE 0.9 % IV SOLN
500.0000 mL | Freq: Once | INTRAVENOUS | Status: DC
Start: 2022-08-15 — End: 2022-08-15

## 2022-08-15 NOTE — Progress Notes (Signed)
Vss nad trans to pacu 

## 2022-08-15 NOTE — Progress Notes (Signed)
Pt's states no medical or surgical changes since previsit or office visit. 

## 2022-08-15 NOTE — Patient Instructions (Signed)
Please read handouts provided. Continue present medications.   YOU HAD AN ENDOSCOPIC PROCEDURE TODAY AT THE [facility]:   Refer to the procedure report that was given to you for any specific questions about what was found during the examination.  If the procedure report does not answer your questions, please call your gastroenterologist to clarify.  If you requested that your care partner not be given the details of your procedure findings, then the procedure report has been included in a sealed envelope for you to review at your convenience later.  YOU SHOULD EXPECT: Some feelings of bloating in the abdomen. Passage of more gas than usual.  Walking can help get rid of the air that was put into your GI tract during the procedure and reduce the bloating. If you had a lower endoscopy (such as a colonoscopy or flexible sigmoidoscopy) you may notice spotting of blood in your stool or on the toilet paper. If you underwent a bowel prep for your procedure, you may not have a normal bowel movement for a few days.  Please Note:  You might notice some irritation and congestion in your nose or some drainage.  This is from the oxygen used during your procedure.  There is no need for concern and it should clear up in a day or so.  SYMPTOMS TO REPORT IMMEDIATELY:  Following lower endoscopy (colonoscopy or flexible sigmoidoscopy):  Excessive amounts of blood in the stool  Significant tenderness or worsening of abdominal pains  Swelling of the abdomen that is new, acute  Fever of 100F or higher  For urgent or emergent issues, a gastroenterologist can be reached at any hour by calling [phone]. Do not use MyChart messaging for urgent concerns.    DIET:  We do recommend a small meal at first, but then you may proceed to your regular diet.  Drink plenty of fluids but you should avoid alcoholic beverages for 24 hours.  ACTIVITY:  You should plan to take it easy for the rest of today and you  should NOT DRIVE or use heavy machinery until tomorrow (because of the sedation medicines used during the test).    FOLLOW UP: Our staff will call the number listed on your records the next business day following your procedure.  We will call around 7:15- 8:00 am to check on you and address any questions or concerns that you may have regarding the information given to you following your procedure. If we do not reach you, we will leave a message.     If any biopsies were taken you will be contacted by phone or by letter within the next 1-3 weeks.  Please call us at [phone] if you have not heard about the biopsies in 3 weeks.    SIGNATURES/CONFIDENTIALITY: You and/or your care partner have signed paperwork which will be entered into your electronic medical record.  These signatures attest to the fact that that the information above on your After Visit Summary has been reviewed and is understood.  Full responsibility of the confidentiality of this discharge information lies with you and/or your care-partner. 

## 2022-08-15 NOTE — Progress Notes (Signed)
History and Physical:  This patient presents for endoscopic testing for: Encounter Diagnosis  Name Primary?   Personal history of colonic polyps Yes    Surveillance colonoscopy today July 2019 - diminutive right colon TA and SSP Patient denies chronic abdominal pain, rectal bleeding, constipation or diarrhea.   Patient is otherwise without complaints or active issues today.   Past Medical History: Past Medical History:  Diagnosis Date   ANEMIA, MILD    CAD (coronary artery disease)    a. CAD,  b. s/p Cypher DES to the ramus and Promus DES x 2 to the RCA 08/2007 (minimal disease in LAD and CFX at that time), c. Myoview 8/10: EF 63%, inf thinning, small prior ant infarct, no ischemia;  d. s/p CABG 3/14 (L-LAD, RIMA-OM1)   DEGENERATIVE JOINT DISEASE, RIGHT HIP    DIVERTICULOSIS, COLON    ERECTILE DYSFUNCTION    GERD    Heart murmur    HIATAL HERNIA    HTN (hypertension)    Hyperlipidemia    statin intolerant   HYPERPLASIA PROSTATE UNS W/O UR OBST & OTH LUTS    LUMBAR RADICULOPATHY, RIGHT    Other abnormal glucose    PAD (peripheral artery disease) (HCC)    pre CABG 04/2012 => ABIs: Right 0.92, left 0.61 // ABI 09/02/14: R 0.93; L 0.82   SPINAL STENOSIS, LUMBAR      Past Surgical History: Past Surgical History:  Procedure Laterality Date   COLONOSCOPY  02/07/2000   negative   COLONOSCOPY  08/2017   TA, Dr Myrtie Neither   CORONARY ARTERY BYPASS GRAFT N/A 04/22/2012   Procedure: CORONARY ARTERY BYPASS GRAFTING (CABG);  Surgeon: Loreli Slot, MD;  Location: [facility] OR;  Service: Open Heart Surgery;  Laterality: N/A;  CABG X 2, BIMA, POSSIBLE EVH   CORONARY STENT PLACEMENT  02/07/2008    X 2   TEE WITHOUT CARDIOVERSION N/A 04/22/2012   Procedure: TRANSESOPHAGEAL ECHOCARDIOGRAM (TEE);  Surgeon: Loreli Slot, MD;  Location: [facility] OR;  Service: Open Heart Surgery;  Laterality: N/A;   TOTAL HIP ARTHROPLASTY Right 02/07/2007   UPPER GASTROINTESTINAL ENDOSCOPY       Allergies: Allergies  Allergen Reactions   Olmesartan Medoxomil Rash   Quinapril Hcl Other (See Comments)     cough; ACE inhibitors are contraindicated because of a history of rash with Angiotensin receptor blocker.   Glucosamine Other (See Comments)    Doesn't remember reaction   Livalo [Pitavastatin]     cramps   Rosuvastatin Other (See Comments)    : muscle cramps   Verapamil Other (See Comments)    Extra sensitive to sun    Outpatient Meds: Current Outpatient Medications  Medication Sig Dispense Refill   amLODipine (NORVASC) 10 MG tablet TAKE 1 TABLET BY MOUTH DAILY 90 tablet 3   cetirizine (ZYRTEC) 10 MG tablet Take 1 tablet (10 mg total) by mouth daily. 30 tablet 11   ezetimibe (ZETIA) 10 MG tablet Take 1 tablet (10 mg total) by mouth daily. (Patient not taking: Reported on 08/07/2022) 90 tablet 3   fluticasone (FLONASE) 50 MCG/ACT nasal spray Place 2 sprays into both nostrils daily. (Patient taking differently: Place 2 sprays into both nostrils daily as needed.) 16 g 6   meclizine (ANTIVERT) 12.5 MG tablet Take 1 tablet (12.5 mg total) by mouth 3 (three) times daily as needed for dizziness. 40 tablet 1   Multiple Vitamin (MULTIVITAMIN WITH MINERALS) TABS Take 1 tablet by mouth daily. Centrum silver     Na  Sulfate-K Sulfate-Mg Sulf 17.5-3.13-1.6 GM/177ML SOLN SMARTSIG:1 Kit(s) By Mouth Once     nitroGLYCERIN (NITROSTAT) 0.4 MG SL tablet Take 1 tab under your tongue for chest pain  If no relief of pain may repeat NTG, one tab every 5 minutes up to 3 tablets total over 15 minutes. 25 tablet 3   tamsulosin (FLOMAX) 0.4 MG CAPS capsule Take 0.4 mg by mouth daily.     triamcinolone (NASACORT) 55 MCG/ACT AERO nasal inhaler Place 2 sprays into the nose daily. (Patient not taking: Reported on 08/07/2022) 1 each 12   Current Facility-Administered Medications  Medication Dose Route Frequency Provider Last Rate Last Admin   0.9 %  sodium chloride infusion  500 mL Intravenous Once Sherrilyn Rist, MD          ___________________________________________________________________ Objective   Exam:  BP (!) 146/92   Pulse 81   Temp 98.9 F (37.2 C)   Ht 6\' 2"  (1.88 m)   Wt 178 lb (80.7 kg)   SpO2 98%   BMI 22.85 kg/m   CV: regular , S1/S2 Resp: clear to auscultation bilaterally, normal RR and effort noted GI: soft, no tenderness, with active bowel sounds.   Assessment: Encounter Diagnosis  Name Primary?   Personal history of colonic polyps Yes     Plan: Colonoscopy  The benefits and risks of the planned procedure were described in detail with the patient or (when appropriate) their health care proxy.  Risks were outlined as including, but not limited to, bleeding, infection, perforation, adverse medication reaction leading to cardiac or pulmonary decompensation, pancreatitis (if ERCP).  The limitation of incomplete mucosal visualization was also discussed.  No guarantees or warranties were given.  The patient is appropriate for an endoscopic procedure in the ambulatory setting.   - Amada Jupiter, MD

## 2022-08-15 NOTE — Op Note (Signed)
[facility] Patient Name: Eric Dickerson Procedure Date: 08/15/2022 8:24 AM MRN: [id number] Endoscopist: Sherilyn Cooter L. Myrtie Neither , MD, [id number] Age: 72 Referring MD:  Date of Birth: [birth year] Gender: Male Account #: [id number] Procedure:                Colonoscopy Indications:              Surveillance: Personal history of adenomatous                            polyps on last colonoscopy 5 years ago                           diminutive TA and SSP July 2019                           no polyps 2009 Medicines:                Monitored Anesthesia Care Procedure:                Pre-Anesthesia Assessment:                           - Prior to the procedure, a History and Physical                            was performed, and patient medications and                            allergies were reviewed. The patient's tolerance of                            previous anesthesia was also reviewed. The risks                            and benefits of the procedure and the sedation                            options and risks were discussed with the patient.                            All questions were answered, and informed consent                            was obtained. Prior Anticoagulants: The patient has                            taken no anticoagulant or antiplatelet agents. ASA                            Grade Assessment: III - A patient with severe                            systemic disease. After reviewing the risks and  benefits, the patient was deemed in satisfactory                            condition to undergo the procedure.                           After obtaining informed consent, the colonoscope                            was passed under direct vision. Throughout the                            procedure, the patient's blood pressure, pulse, and                            oxygen saturations were monitored continuously. The                             Olympus CF-HQ190L ([id number]) Colonoscope was                            introduced through the anus and advanced to the the                            cecum, identified by appendiceal orifice and                            ileocecal valve. The colonoscopy was performed                            without difficulty. The patient tolerated the                            procedure well. The quality of the bowel                            preparation was excellent. The ileocecal valve,                            appendiceal orifice, and rectum were photographed. Scope In: 8:33:23 AM Scope Out: 8:44:57 AM Scope Withdrawal Time: 0 hours 8 minutes 42 seconds  Total Procedure Duration: 0 hours 11 minutes 34 seconds  Findings:                 The perianal and digital rectal examinations were                            normal.                           Repeat examination of right colon under NBI                            performed.  Diverticula were found in the entire colon.                           Internal hemorrhoids were found.                           The exam was otherwise without abnormality on                            direct and retroflexion views. Complications:            No immediate complications. Estimated Blood Loss:     Estimated blood loss: none. Impression:               - Diverticulosis in the entire examined colon.                           - Internal hemorrhoids.                           - The examination was otherwise normal on direct                            and retroflexion views.                           - No specimens collected. Recommendation:           - Patient has a contact number available for                            emergencies. The signs and symptoms of potential                            delayed complications were discussed with the                            patient. Return to normal activities tomorrow.                             Written discharge instructions were provided to the                            patient.                           - Resume previous diet.                           - Continue present medications.                           - No repeat screening colonoscopy recommended due                            to age, current guidelines and low risk polyp  history.                           - FOBT could be obtained in 5 years discretion of                            primary care if patient's overall health favors                            colorectal cancer screening at that time. Eric Dickerson L. Myrtie Neither, MD 08/15/2022 8:51:02 AM This report has been signed electronically.

## 2022-08-16 ENCOUNTER — Telehealth: Payer: Self-pay

## 2022-08-16 NOTE — Telephone Encounter (Signed)
  Follow up Call-     08/15/2022    8:14 AM  Call back number  Post procedure Call Back phone  # [phone]  Permission to leave phone message Yes    Post op call attempted, no answer, left WM.

## 2022-09-04 ENCOUNTER — Ambulatory Visit: Payer: Medicare PPO | Attending: Cardiovascular Disease

## 2022-09-04 DIAGNOSIS — I10 Essential (primary) hypertension: Secondary | ICD-10-CM

## 2022-09-04 DIAGNOSIS — Z951 Presence of aortocoronary bypass graft: Secondary | ICD-10-CM | POA: Diagnosis not present

## 2022-09-04 DIAGNOSIS — E785 Hyperlipidemia, unspecified: Secondary | ICD-10-CM

## 2022-09-06 ENCOUNTER — Ambulatory Visit: Payer: Medicare PPO | Attending: Internal Medicine | Admitting: Pharmacist

## 2022-09-06 DIAGNOSIS — E7800 Pure hypercholesterolemia, unspecified: Secondary | ICD-10-CM | POA: Diagnosis not present

## 2022-09-06 DIAGNOSIS — T466X5D Adverse effect of antihyperlipidemic and antiarteriosclerotic drugs, subsequent encounter: Secondary | ICD-10-CM

## 2022-09-06 DIAGNOSIS — G720 Drug-induced myopathy: Secondary | ICD-10-CM | POA: Diagnosis not present

## 2022-09-06 NOTE — Patient Instructions (Addendum)
Your LDL cholesterol is 125 and your goal is < 55  Lower LDL cholesterol helps to lower your risk of having a heart attack or stroke  Continue taking your Zetia 3 days a week as tolerated   We discussed 2 options, give me a call if you are interested in trying either: Repatha - twice monthly injection that you give yourself at home, lowers LDL by 60% Leqvio - injection given at baseline, month 3, then every 6 months, given at a [facility] clinic, lowers your LDL by 50%  Decrease foods that are high in saturated fat, like red meat, fast food, fried food, and full fat dairy products  Aim for 150 minutes of activity each week

## 2022-09-06 NOTE — Progress Notes (Signed)
Patient ID: Eric Dickerson                 DOB: 09/24/1950                    MRN: [id number]     HPI: Eric Dickerson is a 72 y.o. male patient referred to lipid clinic by Dr Eden Emms. PMH is significant for CAD s/p DES to ramus and DES x2 to RCA in 2009, PAD, HLD, and HTN. Experienced worsening chest pain in 04/2012, LHC demonstrated 3v CAD, and he underwent CABG 04/22/2012. I saw pt for his lipids in 2021, he was taking rosuvastatin 20mg  daily at that time but did not wish to increase dose secondary to fatigue, and did not want to add on additional medication. He now is not on statin therapy anymore, and is referred again to discuss PCSK9i.  Pt is currently taking Zetia about 3 days per week and tolerating ok. He is intolerant to more frequent dosing, as well as 4 statins at various doses and frequencies, noted below. Does not wish to try any additional medication.  Current Medications: Zetia 10mg  3x/week  Intolerances: Crestor 20mg  daily and 3x/week, 10mg  every other day Livalo 2mg  3x/week Pravastatin 40mg  daily and 20mg  3x/week Lipitor Zetia 10mg  daily and every other day   Risk Factors: premature and progressive ASCVD, PAD, HTN, age, FHx CAD (father died from MI at age 87)  LDL goal: 55mg /dL  Family History: Diabetes in his maternal grandmother; Heart attack (age of onset: 32) in his father; Heart attack (age of onset: 41) in his brother; Hyperlipidemia in his brother and mother; Hypertension in his father; Pulmonary embolism in his brother; Stroke in his brother.   Social History: The patient  reports that he quit smoking about 37 years ago. His smoking use included cigarettes. He has a 18.00 pack-year smoking history. He has never used smokeless tobacco. He reports that he does not drink alcohol and does not use drugs.   Labs: 09/04/22: TC 196, TG 64, HDL 59, LDL 125 04/22/19: TC 227, TG 72, HDL 55, LDL 157 - no LLT  Past Medical History:  Diagnosis Date   ANEMIA, MILD    CAD (coronary  artery disease)    a. CAD,  b. s/p Cypher DES to the ramus and Promus DES x 2 to the RCA 08/2007 (minimal disease in LAD and CFX at that time), c. Myoview 8/10: EF 63%, inf thinning, small prior ant infarct, no ischemia;  d. s/p CABG 3/14 (L-LAD, RIMA-OM1)   DEGENERATIVE JOINT DISEASE, RIGHT HIP    DIVERTICULOSIS, COLON    ERECTILE DYSFUNCTION    GERD    Heart murmur    HIATAL HERNIA    HTN (hypertension)    Hyperlipidemia    statin intolerant   HYPERPLASIA PROSTATE UNS W/O UR OBST & OTH LUTS    LUMBAR RADICULOPATHY, RIGHT    Other abnormal glucose    PAD (peripheral artery disease) (HCC)    pre CABG 04/2012 => ABIs: Right 0.92, left 0.61 // ABI 09/02/14: R 0.93; L 0.82   SPINAL STENOSIS, LUMBAR     Current Outpatient Medications on File Prior to Visit  Medication Sig Dispense Refill   amLODipine (NORVASC) 10 MG tablet TAKE 1 TABLET BY MOUTH DAILY 90 tablet 3   cetirizine (ZYRTEC) 10 MG tablet Take 1 tablet (10 mg total) by mouth daily. 30 tablet 11   ezetimibe (ZETIA) 10 MG tablet Take 1 tablet (10 mg  total) by mouth daily. (Patient not taking: Reported on 08/07/2022) 90 tablet 3   fluticasone (FLONASE) 50 MCG/ACT nasal spray Place 2 sprays into both nostrils daily. (Patient not taking: Reported on 08/15/2022) 16 g 6   meclizine (ANTIVERT) 12.5 MG tablet Take 1 tablet (12.5 mg total) by mouth 3 (three) times daily as needed for dizziness. 40 tablet 1   Multiple Vitamin (MULTIVITAMIN WITH MINERALS) TABS Take 1 tablet by mouth daily. Centrum silver     nitroGLYCERIN (NITROSTAT) 0.4 MG SL tablet Take 1 tab under your tongue for chest pain  If no relief of pain may repeat NTG, one tab every 5 minutes up to 3 tablets total over 15 minutes. 25 tablet 3   tamsulosin (FLOMAX) 0.4 MG CAPS capsule Take 0.4 mg by mouth daily.     triamcinolone (NASACORT) 55 MCG/ACT AERO nasal inhaler Place 2 sprays into the nose daily. (Patient not taking: Reported on 08/07/2022) 1 each 12   No current  facility-administered medications on file prior to visit.    Allergies  Allergen Reactions   Olmesartan Medoxomil Rash   Quinapril Hcl Other (See Comments)     cough; ACE inhibitors are contraindicated because of a history of rash with Angiotensin receptor blocker.   Glucosamine Other (See Comments)    Doesn't remember reaction   Livalo [Pitavastatin]     cramps   Rosuvastatin Other (See Comments)    : muscle cramps   Verapamil Other (See Comments)    Extra sensitive to sun    Assessment/Plan:  1. Hyperlipidemia - LDL 125 on max tolerated dose of Zetia 10mg  3x/week, above goal < 55. He is intolerant to 4 statins at various doses and frequencies. Discussed other options for lipid lowering, including PCSK9i and Leqvio. Reviewed cardiovascular benefit and LDL lowering associated with each. Pt does not want to take any medication for his cholesterol. He is happy with how he is currently feeling. Emphasized link between lower LDL and lower risk of future CVD, and that we would stop any medication he tried if he developed side effects. He does not wish to try anything. I provided him information about Repatha and Leqvio and advised him to call me if he changes his mind. He is agreeable to continue on Zetia 3 days a week. Encouraged him to limit saturated fat in his diet and aim for 150 minutes of activity each week.  Lalah Durango E. Sharlene Mccluskey, PharmD, BCACP, CPP [facility] [address] [place], [place] [address] Phone: [phone]; Fax: [phone] 09/06/2022 8:37 AM

## 2022-10-10 ENCOUNTER — Emergency Department (HOSPITAL_COMMUNITY): Payer: Medicare PPO

## 2022-10-10 ENCOUNTER — Observation Stay (HOSPITAL_COMMUNITY): Payer: Medicare PPO

## 2022-10-10 ENCOUNTER — Inpatient Hospital Stay (HOSPITAL_COMMUNITY)
Admission: EM | Admit: 2022-10-10 | Discharge: 2022-10-12 | DRG: 064 | Disposition: A | Discharge status: Rehabilitation Facility | Payer: Medicare PPO | Attending: Family Medicine | Admitting: Family Medicine

## 2022-10-10 ENCOUNTER — Encounter (HOSPITAL_COMMUNITY): Payer: Self-pay | Admitting: Family Medicine

## 2022-10-10 ENCOUNTER — Other Ambulatory Visit: Payer: Self-pay

## 2022-10-10 DIAGNOSIS — I25810 Atherosclerosis of coronary artery bypass graft(s) without angina pectoris: Secondary | ICD-10-CM | POA: Diagnosis not present

## 2022-10-10 DIAGNOSIS — Z87891 Personal history of nicotine dependence: Secondary | ICD-10-CM | POA: Diagnosis not present

## 2022-10-10 DIAGNOSIS — I7774 Dissection of vertebral artery: Secondary | ICD-10-CM | POA: Diagnosis present

## 2022-10-10 DIAGNOSIS — R29818 Other symptoms and signs involving the nervous system: Secondary | ICD-10-CM | POA: Diagnosis not present

## 2022-10-10 DIAGNOSIS — Z955 Presence of coronary angioplasty implant and graft: Secondary | ICD-10-CM | POA: Diagnosis not present

## 2022-10-10 DIAGNOSIS — I6621 Occlusion and stenosis of right posterior cerebral artery: Secondary | ICD-10-CM | POA: Diagnosis not present

## 2022-10-10 DIAGNOSIS — E876 Hypokalemia: Secondary | ICD-10-CM | POA: Diagnosis present

## 2022-10-10 DIAGNOSIS — Z823 Family history of stroke: Secondary | ICD-10-CM

## 2022-10-10 DIAGNOSIS — Z8249 Family history of ischemic heart disease and other diseases of the circulatory system: Secondary | ICD-10-CM | POA: Diagnosis not present

## 2022-10-10 DIAGNOSIS — Z96641 Presence of right artificial hip joint: Secondary | ICD-10-CM | POA: Diagnosis present

## 2022-10-10 DIAGNOSIS — I10 Essential (primary) hypertension: Secondary | ICD-10-CM | POA: Diagnosis present

## 2022-10-10 DIAGNOSIS — I739 Peripheral vascular disease, unspecified: Secondary | ICD-10-CM | POA: Diagnosis present

## 2022-10-10 DIAGNOSIS — H9192 Unspecified hearing loss, left ear: Secondary | ICD-10-CM | POA: Diagnosis present

## 2022-10-10 DIAGNOSIS — D72829 Elevated white blood cell count, unspecified: Secondary | ICD-10-CM | POA: Diagnosis present

## 2022-10-10 DIAGNOSIS — I2510 Atherosclerotic heart disease of native coronary artery without angina pectoris: Secondary | ICD-10-CM | POA: Diagnosis present

## 2022-10-10 DIAGNOSIS — I6782 Cerebral ischemia: Secondary | ICD-10-CM | POA: Diagnosis not present

## 2022-10-10 DIAGNOSIS — Z888 Allergy status to other drugs, medicaments and biological substances status: Secondary | ICD-10-CM | POA: Diagnosis not present

## 2022-10-10 DIAGNOSIS — E785 Hyperlipidemia, unspecified: Secondary | ICD-10-CM | POA: Diagnosis present

## 2022-10-10 DIAGNOSIS — N400 Enlarged prostate without lower urinary tract symptoms: Secondary | ICD-10-CM | POA: Diagnosis present

## 2022-10-10 DIAGNOSIS — R7989 Other specified abnormal findings of blood chemistry: Secondary | ICD-10-CM | POA: Diagnosis not present

## 2022-10-10 DIAGNOSIS — I639 Cerebral infarction, unspecified: Secondary | ICD-10-CM | POA: Diagnosis present

## 2022-10-10 DIAGNOSIS — I69398 Other sequelae of cerebral infarction: Secondary | ICD-10-CM | POA: Diagnosis not present

## 2022-10-10 DIAGNOSIS — Z833 Family history of diabetes mellitus: Secondary | ICD-10-CM | POA: Diagnosis not present

## 2022-10-10 DIAGNOSIS — K219 Gastro-esophageal reflux disease without esophagitis: Secondary | ICD-10-CM | POA: Diagnosis present

## 2022-10-10 DIAGNOSIS — Z83438 Family history of other disorder of lipoprotein metabolism and other lipidemia: Secondary | ICD-10-CM | POA: Diagnosis not present

## 2022-10-10 DIAGNOSIS — R231 Pallor: Secondary | ICD-10-CM | POA: Diagnosis not present

## 2022-10-10 DIAGNOSIS — N401 Enlarged prostate with lower urinary tract symptoms: Secondary | ICD-10-CM | POA: Diagnosis not present

## 2022-10-10 DIAGNOSIS — R42 Dizziness and giddiness: Secondary | ICD-10-CM | POA: Diagnosis not present

## 2022-10-10 DIAGNOSIS — R112 Nausea with vomiting, unspecified: Secondary | ICD-10-CM | POA: Diagnosis not present

## 2022-10-10 DIAGNOSIS — Z79899 Other long term (current) drug therapy: Secondary | ICD-10-CM | POA: Diagnosis not present

## 2022-10-10 DIAGNOSIS — R7303 Prediabetes: Secondary | ICD-10-CM | POA: Diagnosis not present

## 2022-10-10 DIAGNOSIS — I6389 Other cerebral infarction: Secondary | ICD-10-CM | POA: Diagnosis not present

## 2022-10-10 DIAGNOSIS — Z951 Presence of aortocoronary bypass graft: Secondary | ICD-10-CM | POA: Diagnosis not present

## 2022-10-10 DIAGNOSIS — I63542 Cerebral infarction due to unspecified occlusion or stenosis of left cerebellar artery: Principal | ICD-10-CM | POA: Diagnosis present

## 2022-10-10 DIAGNOSIS — R739 Hyperglycemia, unspecified: Secondary | ICD-10-CM | POA: Diagnosis present

## 2022-10-10 DIAGNOSIS — M4802 Spinal stenosis, cervical region: Secondary | ICD-10-CM | POA: Diagnosis present

## 2022-10-10 DIAGNOSIS — I6502 Occlusion and stenosis of left vertebral artery: Secondary | ICD-10-CM | POA: Diagnosis present

## 2022-10-10 DIAGNOSIS — R339 Retention of urine, unspecified: Secondary | ICD-10-CM | POA: Diagnosis not present

## 2022-10-10 DIAGNOSIS — I63232 Cerebral infarction due to unspecified occlusion or stenosis of left carotid arteries: Secondary | ICD-10-CM | POA: Diagnosis not present

## 2022-10-10 LAB — BASIC METABOLIC PANEL
Anion gap: 17 — ABNORMAL HIGH (ref 5–15)
BUN: 19 mg/dL (ref 8–23)
CO2: 21 mmol/L — ABNORMAL LOW (ref 22–32)
Calcium: 9.2 mg/dL (ref 8.9–10.3)
Chloride: 103 mmol/L (ref 98–111)
Creatinine, Ser: 1.12 mg/dL (ref 0.61–1.24)
GFR, Estimated: >60 mL/min (ref >60)
Glucose, Bld: 198 mg/dL — ABNORMAL HIGH (ref 70–99)
Potassium: 3 mmol/L — ABNORMAL LOW (ref 3.5–5.1)
Sodium: 141 mmol/L (ref 135–145)

## 2022-10-10 LAB — CBC WITH DIFFERENTIAL/PLATELET
Abs Immature Granulocytes: 0.08 10*3/uL — ABNORMAL HIGH (ref 0.00–0.07)
Basophils Absolute: 0.1 10*3/uL (ref 0.0–0.1)
Basophils Relative: 1 %
Eosinophils Absolute: 0.1 10*3/uL (ref 0.0–0.5)
Eosinophils Relative: 1 %
HCT: 38.5 % — ABNORMAL LOW (ref 39.0–52.0)
Hemoglobin: 12 g/dL — ABNORMAL LOW (ref 13.0–17.0)
Immature Granulocytes: 1 %
Lymphocytes Relative: 22 %
Lymphs Abs: 2.3 10*3/uL (ref 0.7–4.0)
MCH: 28.2 pg (ref 26.0–34.0)
MCHC: 31.2 g/dL (ref 30.0–36.0)
MCV: 90.4 fL (ref 80.0–100.0)
Monocytes Absolute: 0.9 10*3/uL (ref 0.1–1.0)
Monocytes Relative: 9 %
Neutro Abs: 7.1 10*3/uL (ref 1.7–7.7)
Neutrophils Relative %: 66 %
Platelets: 267 10*3/uL (ref 150–400)
RBC: 4.26 MIL/uL (ref 4.22–5.81)
RDW: 14.5 % (ref 11.5–15.5)
WBC: 10.6 10*3/uL — ABNORMAL HIGH (ref 4.0–10.5)
nRBC: 0 % (ref 0.0–0.2)

## 2022-10-10 LAB — CBG MONITORING, ED: Glucose-Capillary: 161 mg/dL — ABNORMAL HIGH (ref 70–99)

## 2022-10-10 MED ORDER — ACETAMINOPHEN 325 MG PO TABS: 650.0000 mg | ORAL_TABLET | ORAL | Status: DC | PRN

## 2022-10-10 MED ORDER — ENOXAPARIN SODIUM 40 MG/0.4ML IJ SOSY
40.0000 mg | PREFILLED_SYRINGE | INTRAMUSCULAR | Status: DC
  Administered 2022-10-10 – 2022-10-11 (×2): 40 mg via SUBCUTANEOUS
  Filled 2022-10-10 (×2): qty 0.4

## 2022-10-10 MED ORDER — POTASSIUM CHLORIDE CRYS ER 20 MEQ PO TBCR
40.0000 meq | EXTENDED_RELEASE_TABLET | Freq: Once | ORAL | Status: AC
  Administered 2022-10-10: 40 meq via ORAL
  Filled 2022-10-10: qty 2

## 2022-10-10 MED ORDER — ASPIRIN 300 MG RE SUPP: 300.0000 mg | Freq: Every day | RECTAL | Status: DC

## 2022-10-10 MED ORDER — EZETIMIBE 10 MG PO TABS
10.0000 mg | ORAL_TABLET | ORAL | Status: DC
  Administered 2022-10-11: 10 mg via ORAL
  Filled 2022-10-10: qty 1

## 2022-10-10 MED ORDER — TAMSULOSIN HCL 0.4 MG PO CAPS
0.4000 mg | ORAL_CAPSULE | Freq: Every day | ORAL | Status: DC
  Administered 2022-10-10 – 2022-10-11 (×2): 0.4 mg via ORAL
  Filled 2022-10-10 (×2): qty 1

## 2022-10-10 MED ORDER — IOHEXOL 350 MG/ML SOLN
75.0000 mL | Freq: Once | INTRAVENOUS | Status: AC | PRN
  Administered 2022-10-10: 75 mL via INTRAVENOUS

## 2022-10-10 MED ORDER — ONDANSETRON HCL 4 MG/2ML IJ SOLN
4.0000 mg | Freq: Four times a day (QID) | INTRAMUSCULAR | Status: DC | PRN
  Administered 2022-10-10: 4 mg via INTRAVENOUS
  Filled 2022-10-10: qty 2

## 2022-10-10 MED ORDER — STROKE: EARLY STAGES OF RECOVERY BOOK
Freq: Once | Status: AC
  Filled 2022-10-10: qty 1

## 2022-10-10 MED ORDER — METOCLOPRAMIDE HCL 5 MG/ML IJ SOLN
5.0000 mg | Freq: Once | INTRAMUSCULAR | Status: AC
  Administered 2022-10-10: 5 mg via INTRAVENOUS
  Filled 2022-10-10: qty 2

## 2022-10-10 MED ORDER — ACETAMINOPHEN 650 MG RE SUPP: 650.0000 mg | RECTAL | Status: DC | PRN

## 2022-10-10 MED ORDER — ORAL CARE MOUTH RINSE: 15.0000 mL | OROMUCOSAL | Status: DC | PRN

## 2022-10-10 MED ORDER — ACETAMINOPHEN 160 MG/5ML PO SOLN: 650.0000 mg | ORAL | Status: DC | PRN

## 2022-10-10 MED ORDER — SODIUM CHLORIDE 0.9 % IV BOLUS
500.0000 mL | Freq: Once | INTRAVENOUS | Status: AC
  Administered 2022-10-10: 500 mL via INTRAVENOUS

## 2022-10-10 MED ORDER — MECLIZINE HCL 25 MG PO TABS
25.0000 mg | ORAL_TABLET | Freq: Once | ORAL | Status: AC
  Administered 2022-10-10: 25 mg via ORAL
  Filled 2022-10-10: qty 1

## 2022-10-10 MED ORDER — CLOPIDOGREL BISULFATE 75 MG PO TABS
75.0000 mg | ORAL_TABLET | Freq: Every day | ORAL | Status: DC
  Administered 2022-10-10 – 2022-10-12 (×3): 75 mg via ORAL
  Filled 2022-10-10 (×3): qty 1

## 2022-10-10 MED ORDER — ASPIRIN 325 MG PO TABS
ORAL_TABLET | ORAL | Status: AC
  Filled 2022-10-10: qty 1

## 2022-10-10 MED ORDER — ASPIRIN 325 MG PO TABS
325.0000 mg | ORAL_TABLET | Freq: Every day | ORAL | Status: DC
  Administered 2022-10-10: 325 mg via ORAL

## 2022-10-10 NOTE — ED Notes (Signed)
Pt states he went to MRI with his shoes, keys, and pants in a bag but when he returned from MRI, the bag with pt belongings was not in pt's room. RN called MRI and asked if MRI had pt belongings. MRI stated they did not have pt belongings. RN went to MRI to look for pt belongings and was unable to find pt belongings. This RN notified charge RN that pt belongings were missing. Second RN went to MRI to check on pt belongings. MRI stated they would look around MRI room after they finish with another pt in MRI and would notify RN whether or not they can find pt belongings.

## 2022-10-10 NOTE — Assessment & Plan Note (Addendum)
MRI brain showed acute infarcts in the left cerebellum - Non-invasive angiography showed advanced atherosclerosis versus dissection of the left vertebral artery - Echocardiogram pending - Lipids ordered: LDL 152, previously extensively statin intolerant - Continue Zetia, outpatient referral for PCSK9 inhibitor - Aspirin and Plavix for 3 months - Permissive hypertension - Atrial fibrillation: Not on monitoring - tPA not given because outside the window - Dysphagia screen ordered in ER - PT eval ordered: Recommending inpatient rehab - Nonsmoker

## 2022-10-10 NOTE — Hospital Course (Signed)
Mr. Varland is a 72 y.o. M with hx CAD s/p CABG 15 years ago, HLD statin intolerant, and HTN who presented with acute dizziness, blurry vision.  In the ER, MRI brain showed cerebellar stroke.

## 2022-10-10 NOTE — ED Notes (Signed)
MRI staff contacted this writer to inform that PT's belongings were not found in MRI room.

## 2022-10-10 NOTE — Assessment & Plan Note (Addendum)
-   Continue Flomax 

## 2022-10-10 NOTE — Consult Note (Signed)
Neurology Consultation  Reason for Consult: Stroke Referring Physician: Dr. Maryfrances Bunnell  CC: Dizziness  History is obtained from: Chart, patient  HPI: Eric Dickerson is a 72 y.o. male past medical history of CAD, hypertension, hyperlipidemia, peripheral artery disease presented to the emergency department for evaluation of dizziness and room sensation that started suddenly around 10 AM when he was at work.  He was seen in the emergency department and given his history of vertigo, was evaluated with an MRI that showed cerebellar infarcts for which neurology was consulted. Reports that he had vertigo before and has been seeing his primary care doctor for about a month for vertigo-like symptoms with this was much different.  He works Estate manager/land agent job, he is at work, sat down on his desk and could not get up because he felt extreme room spinning sensation and feeling of unsteadiness and felt that he would fall if he stood on his feet. History's note is timed at around 11:30 AM, but he was activated as a code stroke.   LKW: 10 AM IV thrombolysis given?: no, outside the window by the time neurological consultation was obtained Premorbid modified Rankin scale (mRS): 0   ROS: Full ROS was performed and is negative except as noted in the HPI.   Past Medical History:  Diagnosis Date   ANEMIA, MILD    CAD (coronary artery disease)    a. CAD,  b. s/p Cypher DES to the ramus and Promus DES x 2 to the RCA 08/2007 (minimal disease in LAD and CFX at that time), c. Myoview 8/10: EF 63%, inf thinning, small prior ant infarct, no ischemia;  d. s/p CABG 3/14 (L-LAD, RIMA-OM1)   DEGENERATIVE JOINT DISEASE, RIGHT HIP    DIVERTICULOSIS, COLON    ERECTILE DYSFUNCTION    GERD    Heart murmur    HIATAL HERNIA    HTN (hypertension)    Hyperlipidemia    statin intolerant   HYPERPLASIA PROSTATE UNS W/O UR OBST & OTH LUTS    LUMBAR RADICULOPATHY, RIGHT    Other abnormal glucose    PAD (peripheral artery disease)  (HCC)    pre CABG 04/2012 => ABIs: Right 0.92, left 0.61 // ABI 09/02/14: R 0.93; L 0.82   SPINAL STENOSIS, LUMBAR      Family History  Problem Relation Age of Onset   Hyperlipidemia Mother    Hypertension Father    Heart attack Father 78       smoker   Stroke Brother        2 brothers ; 21 & 89   Hyperlipidemia Brother    Pulmonary embolism Brother    Heart attack Brother 48   Diabetes Maternal Grandmother    Cancer Neg Hx    COPD Neg Hx    Colon cancer Neg Hx    Esophageal cancer Neg Hx    Stomach cancer Neg Hx    Rectal cancer Neg Hx    Colon polyps Neg Hx      Social History:   reports that he quit smoking about 37 years ago. His smoking use included cigarettes. He started smoking about 49 years ago. He has a 18 pack-year smoking history. He has never used smokeless tobacco. He reports that he does not drink alcohol and does not use drugs.  Medications No current facility-administered medications for this encounter.  Current Outpatient Medications:    acetaminophen (TYLENOL) 500 MG tablet, Take 500 mg by mouth daily as needed for moderate pain, fever or  headache., Disp: , Rfl:    amLODipine (NORVASC) 10 MG tablet, TAKE 1 TABLET BY MOUTH DAILY, Disp: 90 tablet, Rfl: 3   ezetimibe (ZETIA) 10 MG tablet, Take 10 mg by mouth 3 (three) times a week., Disp: , Rfl:    meclizine (ANTIVERT) 12.5 MG tablet, Take 1 tablet (12.5 mg total) by mouth 3 (three) times daily as needed for dizziness., Disp: 40 tablet, Rfl: 1   Multiple Vitamins-Minerals (CENTRUM SILVER 50+MEN) TABS, Take 1 tablet by mouth daily., Disp: , Rfl:    tamsulosin (FLOMAX) 0.4 MG CAPS capsule, Take 0.4 mg by mouth at bedtime., Disp: , Rfl:    nitroGLYCERIN (NITROSTAT) 0.4 MG SL tablet, Take 1 tab under your tongue for chest pain  If no relief of pain may repeat NTG, one tab every 5 minutes up to 3 tablets total over 15 minutes. (Patient not taking: Reported on 10/10/2022), Disp: 25 tablet, Rfl: 3   Exam: Current  vital signs: BP (!) 147/90 (BP Location: Right Arm)   Pulse 71   Temp (!) 97.2 F (36.2 C) (Oral)   Resp (!) 22   SpO2 100%  Vital signs in last 24 hours: Temp:  [97.2 F (36.2 C)-97.6 F (36.4 C)] 97.2 F (36.2 C) (09/03 1616) Pulse Rate:  [61-83] 71 (09/03 1616) Resp:  [11-23] 22 (09/03 1616) BP: (121-154)/(64-91) 147/90 (09/03 1616) SpO2:  [95 %-100 %] 100 % (09/03 1616) General: Awake alert in no distress HEENT: Normocephalic/atraumatic Eyes: Clear Cardiovascular: Regular rhythm Abdomen nondistended nontender Neurological exam awake alert oriented x 3 No dysarthria No aphasia Cranial nerves II to XII: Pupils equal round react light, extraocular intact on both sides but he has nystagmus most dominant on rightward gaze and upward gaze and somewhat less prominent on leftward gaze which is direction changing.  Visual fields full.  Face symmetric.  Auditory acuity intact.  Tongue and palate midline. Motor examination with no drift Sensation intact Coordination with no dysmetria NIH stroke scale-0  Labs I have reviewed labs in epic and the results pertinent to this consultation are:  CBC    Component Value Date/Time   WBC 10.6 (H) 10/10/2022 1140   RBC 4.26 10/10/2022 1140   HGB 12.0 (L) 10/10/2022 1140   HCT 38.5 (L) 10/10/2022 1140   PLT 267 10/10/2022 1140   MCV 90.4 10/10/2022 1140   MCH 28.2 10/10/2022 1140   MCHC 31.2 10/10/2022 1140   RDW 14.5 10/10/2022 1140   LYMPHSABS 2.3 10/10/2022 1140   MONOABS 0.9 10/10/2022 1140   EOSABS 0.1 10/10/2022 1140   BASOSABS 0.1 10/10/2022 1140    CMP     Component Value Date/Time   NA 141 10/10/2022 1140   NA 139 10/10/2018 0951   K 3.0 (L) 10/10/2022 1140   CL 103 10/10/2022 1140   CO2 21 (L) 10/10/2022 1140   GLUCOSE 198 (H) 10/10/2022 1140   BUN 19 10/10/2022 1140   BUN 18 10/10/2018 0951   CREATININE 1.12 10/10/2022 1140   CALCIUM 9.2 10/10/2022 1140   PROT 6.7 09/04/2022 0832   ALBUMIN 3.9 09/04/2022 0832    AST 21 09/04/2022 0832   ALT 13 09/04/2022 0832   ALKPHOS 99 09/04/2022 0832   BILITOT 0.2 09/04/2022 0832   GFRNONAA >60 10/10/2022 1140   GFRAA 82 10/10/2018 0951    Lipid Panel     Component Value Date/Time   CHOL 196 09/04/2022 0832   TRIG 64 09/04/2022 0832   HDL 59 09/04/2022 0832   CHOLHDL  3.3 09/04/2022 0832   CHOLHDL 4 03/28/2022 1440   VLDL 13.0 03/28/2022 1440   LDLCALC 125 (H) 09/04/2022 0832   LDLDIRECT 130.4 11/17/2009 0853    Lab Results  Component Value Date   HGBA1C 6.0 03/28/2022      Imaging I have reviewed the images obtained:  MR brain with acute infarcts in the left cerebellum. CT angio of the neck pending   Assessment: 72 year old with above past medical history presenting with sudden onset of dizziness and gait instability as well as feeling of falling if he stood up with room spinning noted to have left cerebellar infarcts. His last known well was somewhere around 10 AM-he was outside the window for IV TNKase by the time neurological consultation was obtained. Given his prior cardiac history, atheroembolic versus cardioembolic source of stroke is suspected-further workup needed.  Impression: Acute cerebellar infarcts  Recommendations: Admit to hospitalist Frequent neurochecks Intolerant to aspirin-on Plavix at home.  Continue Plavix for now Intolerant to multiple statins-has been on Sethi at 3 times a week per last cardiology/pharmacy note.  I would recommend considering more intensive cholesterol management with LDL level less than 70. CTA completed-results pending 2D echo A1c Lipid panel PT OT Speech therapy Permissive hypertension-allow for permissive hypertension for the next 48 hours or so before normalizing blood pressures.  Treat only if systolic is greater than 220. Stroke team will follow with you Plan relayed to Dr. Maryfrances Bunnell   -- Milon Dikes, MD Neurologist [facility] Pager: [phone]

## 2022-10-10 NOTE — ED Triage Notes (Signed)
Pt to ED via EMS from work. 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 Pt c/o dizziness followed by N/V. Pt states he went to the bathroom to urinate, and after going to the bathroom became very hot and dizzy. Pt denies CP. Pt has hx of stents and CABG 10-15 years ago. Pt was dx with vertigo 1 month ago and states it was worsening today. EMS gave 500cc NS bolus and 4 zofran en route.   EMS: 20 LFA 146/90 72 HR 99% RA 153 CBG

## 2022-10-10 NOTE — ED Provider Notes (Signed)
[place] EMERGENCY DEPARTMENT AT [facility] Provider Note   CSN: [id number] Arrival date & time: 10/10/22  1127     History  Chief Complaint  Patient presents with   Dizziness   HPI Eric Dickerson is a 72 y.o. male with CAD, PAD, hypertension hyperlipidemia status post CABG 15 years ago presenting for dizziness.  States today around 10 AM started to feel hot and then dizzy with a room spinning sensation then followed by nausea and vomiting.  Denies syncope.  CBC was initially short of breath but no chest pain.  Also endorses blurriness in both eyes.  Denies fever or nuchal rigidity.  States he was diagnosed with vertigo 1 month ago but symptoms are worse today.  EMS was called and he was given 500 cc of normal saline and 4 mg of Zofran en route.  Denies numbness or weakness in his extremities, denies numbness in his face.   Dizziness      Home Medications Prior to Admission medications   Medication Sig Start Date End Date Taking? Authorizing Provider  amLODipine (NORVASC) 10 MG tablet TAKE 1 TABLET BY MOUTH DAILY 03/28/22   [name], MD  cetirizine (ZYRTEC) 10 MG tablet Take 1 tablet (10 mg total) by mouth daily. 08/07/22 08/07/23  [name], MD  ezetimibe (ZETIA) 10 MG tablet Take 10 mg by mouth 3 (three) times a week.    [provider]  fluticasone (FLONASE) 50 MCG/ACT nasal spray Place 2 sprays into both nostrils daily. Patient not taking: Reported on 08/15/2022 11/27/17   Olive Bass, FNP  meclizine (ANTIVERT) 12.5 MG tablet Take 1 tablet (12.5 mg total) by mouth 3 (three) times daily as needed for dizziness. 08/07/22 08/07/23  [name], MD  Multiple Vitamin (MULTIVITAMIN WITH MINERALS) TABS Take 1 tablet by mouth daily. Centrum silver    [provider]  nitroGLYCERIN (NITROSTAT) 0.4 MG SL tablet Take 1 tab under your tongue for chest pain  If no relief of pain may repeat NTG, one tab every 5 minutes up to 3 tablets total over 15  minutes. 04/26/21   [name], MD  tamsulosin (FLOMAX) 0.4 MG CAPS capsule Take 0.4 mg by mouth daily. 03/09/15   [provider]  triamcinolone (NASACORT) 55 MCG/ACT AERO nasal inhaler Place 2 sprays into the nose daily. Patient not taking: Reported on 08/07/2022 08/31/20   [name], MD      Allergies    Olmesartan medoxomil, Quinapril hcl, Atorvastatin, Glucosamine, Livalo [pitavastatin], Pravastatin, Rosuvastatin, Verapamil, and Zetia [ezetimibe]    Review of Systems   Review of Systems  Neurological:  Positive for dizziness.    Physical Exam Updated Vital Signs BP (!) 154/86   Pulse 70   Temp 97.6 F (36.4 C) (Oral)   Resp 20   SpO2 100%  Physical Exam Vitals and nursing note reviewed.  HENT:     Head: Normocephalic and atraumatic.     Mouth/Throat:     Mouth: Mucous membranes are moist.  Eyes:     General:        Right eye: No discharge.        Left eye: No discharge.     Conjunctiva/sclera: Conjunctivae normal.  Cardiovascular:     Rate and Rhythm: Normal rate and regular rhythm.     Pulses: Normal pulses.     Heart sounds: Normal heart sounds.  Pulmonary:     Effort: Pulmonary effort is normal.  Breath sounds: Normal breath sounds.  Abdominal:     General: Abdomen is flat.     Palpations: Abdomen is soft.  Skin:    General: Skin is warm and dry.  Neurological:     General: No focal deficit present.     Comments: GCS 15. Speech is goal oriented. No deficits appreciated to CN III-XII; symmetric eyebrow raise, no facial drooping, tongue midline. Patient has equal grip strength bilaterally with 5/5 strength against resistance in all major muscle groups bilaterally. Sensation to light touch intact. Patient moves extremities without ataxia. Normal finger-nose-finger. Patient refused assessment of gait due to concern for ongoing dizziness.  Psychiatric:        Mood and Affect: Mood normal.     ED Results / Procedures / Treatments   Labs (all  labs ordered are listed, but only abnormal results are displayed) Labs Reviewed  BASIC METABOLIC PANEL - Abnormal; Notable for the following components:      Result Value   Potassium 3.0 (*)    CO2 21 (*)    Glucose, Bld 198 (*)    Anion gap 17 (*)    All other components within normal limits  CBC WITH DIFFERENTIAL/PLATELET - Abnormal; Notable for the following components:   WBC 10.6 (*)    Hemoglobin 12.0 (*)    HCT 38.5 (*)    Abs Immature Granulocytes 0.08 (*)    All other components within normal limits  CBG MONITORING, ED - Abnormal; Notable for the following components:   Glucose-Capillary 161 (*)    All other components within normal limits    EKG None  Radiology MR Brain Wo Contrast (neuro protocol)  Result Date: 10/10/2022 CLINICAL DATA:  Neuro deficit, acute, stroke suspected EXAM: MRI HEAD WITHOUT CONTRAST TECHNIQUE: Multiplanar, multiecho pulse sequences of the brain and surrounding structures were obtained without intravenous contrast. COMPARISON:  None Available. FINDINGS: Brain: There are acute infarcts in the left cerebellum (series 3, image 9-13). No hemorrhage. No hydrocephalus. No extra-axial fluid collection. Sequela of mild chronic microvascular ischemic change. No mass effect. No mass lesion. Chronic infarcts in the left corona radiata. Vascular: Normal flow voids. Skull and upper cervical spine: Severe spinal canal stenosis C4-C5 Sinuses/Orbits: No middle ear or mastoid effusion. Paranasal sinuses are clear. Orbits are unremarkable. Other: None. IMPRESSION: 1. Acute infarcts in the left cerebellum. No hemorrhage. 2. Severe spinal canal stenosis at C4-C5. Consider further evaluation with a cervical spine MRI. Electronically Signed   By: Lorenza Cambridge M.D.   On: 10/10/2022 14:38    Procedures Procedures    Medications Ordered in ED Medications  sodium chloride 0.9 % bolus 500 mL (0 mLs Intravenous Stopped 10/10/22 1512)  meclizine (ANTIVERT) tablet 25 mg (25 mg  Oral Given 10/10/22 1233)  metoCLOPramide (REGLAN) injection 5 mg (5 mg Intravenous Given 10/10/22 1240)  potassium chloride SA (KLOR-CON M) CR tablet 40 mEq (40 mEq Oral Given 10/10/22 1535)    ED Course/ Medical Decision Making/ A&P Clinical Course as of 10/10/22 1540  Tue Oct 10, 2022  1444 Per radiology: multiple acute left cerebellum, acute cervical stenosis c4 to c5,  [JR]    Clinical Course User Index [JR] Gareth Eagle, PA-C                                 Medical Decision Making Amount and/or Complexity of Data Reviewed Labs: ordered. Radiology: ordered.  Risk Prescription drug management.  Initial Impression and Ddx 72 year old well-appearing male presenting for dizziness.  Exam was unremarkable but patient did refuse gait assessment due to dizziness.  DDx includes stroke, electrolyte derangement, ACS, orthostatic hypotension.. Patient PMH that increases complexity of ED encounter:  CAD, PAD, hypertension hyperlipidemia status post CABG 15 years ago   Interpretation of Diagnostics - I independent reviewed and interpreted the labs as followed: Hyperglycemia, leukocytosis, hypokalemia  - I independently visualized the following imaging with scope of interpretation limited to determining acute life threatening conditions related to emergency care: MRI brain, which revealed multiple acute cerebellar infarcts  -I personally reviewed and interpreted EKG which revealed sinus rhythm with right bundle branch block  Patient Reassessment and Ultimate Disposition/Management Workup suggestive of acute posterior stroke. Discussed patient with Dr. Wilford Corner of neurology. Advised to admit to hospital service for stroke workup.  Admitted to hospital service with Dr. Maryfrances Bunnell.   Patient management required discussion with the following services or consulting groups:  Hospitalist Service and Neurology  Complexity of Problems Addressed Acute complicated illness or Injury  Additional Data  Reviewed and Analyzed Further history obtained from: Further history from spouse/family member, Past medical history and medications listed in the EMR, and Prior ED visit notes  Patient Encounter Risk Assessment None         Final Clinical Impression(s) / ED Diagnoses Final diagnoses:  Cerebellar infarct [facility])    Rx / DC Orders ED Discharge Orders     None         Gareth Eagle, PA-C 10/10/22 1540    Benjiman Core, MD 10/11/22 [id number]

## 2022-10-10 NOTE — ED Notes (Signed)
MRI contacted this writer again to inform that they had found this patient's belongings and would be bringing them to the patient's room shortly. Pt's nurse and family made aware of same.

## 2022-10-10 NOTE — Assessment & Plan Note (Addendum)
Check BMP tomorrow

## 2022-10-10 NOTE — Assessment & Plan Note (Signed)
-   Hold home amlodipine - Permissive hypertension

## 2022-10-10 NOTE — Assessment & Plan Note (Addendum)
-   Continue Zetia - Statin intolerant - Aspirin and Plavix

## 2022-10-10 NOTE — ED Notes (Signed)
ED TO INPATIENT HANDOFF REPORT  ED Nurse Name and Phone #:  Theadora Rama RN [phone]  S Name/Age/Gender Eric Dickerson 72 y.o. male Room/Bed: 038C/038C  Code Status   Code Status: Full Code  Home/SNF/Other Home Patient oriented to: self, place, time, and situation Is this baseline? Yes   Triage Complete: Triage complete  Chief Complaint Acute ischemic stroke [facility]) [I63.9]  Triage Note Pt to ED via EMS from work. 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 Pt c/o dizziness followed by N/V. Pt states he went to the bathroom to urinate, and after going to the bathroom became very hot and dizzy. Pt denies CP. Pt has hx of stents and CABG 10-15 years ago. Pt was dx with vertigo 1 month ago and states it was worsening today. EMS gave 500cc NS bolus and 4 zofran en route.   EMS: 20 LFA 146/90 72 HR 99% RA 153 CBG    Allergies Allergies  Allergen Reactions   Accupril [Quinapril Hcl] Cough   Ace Inhibitors Other (See Comments)    ACE inhibitors are contraindicated because of a history of rash with Angiotensin receptor blocker.   Calan [Verapamil] Other (See Comments)    Sun sensitivity   Crestor [Rosuvastatin] Other (See Comments)    Myalgias Cramps   Glucosamine Other (See Comments)    Unknown reaction   Lipitor [Atorvastatin] Other (See Comments)    Myalgias  Cramps   Livalo [Pitavastatin] Other (See Comments)    Cramps   Pravachol [Pravastatin] Other (See Comments)    Myalgias Cramps   Statins Other (See Comments)    Myalgias, cramping with: Crestor, Lipitor, Livalo, Pravachol.   Zetia [Ezetimibe] Other (See Comments)    Myalgias Cramps  Pt ok on 3x weekly dose   Benicar [Olmesartan] Rash    Level of Care/Admitting Diagnosis ED Disposition     ED Disposition  Admit   Condition  --   Comment  Hospital Area: [facility] [[id number]]  Level of Care: Telemetry Medical [104]  May place patient in observation at [facility] or [facility] if  equivalent level of care is available:: No  Covid Evaluation: Asymptomatic - no recent exposure (last 10 days) testing not required  Diagnosis: Acute ischemic stroke [facility]) [[id number]]  Admitting Physician: Alberteen Sam [[id number]]  Attending Physician: Alberteen Sam [[id number]]          B Medical/Surgery History Past Medical History:  Diagnosis Date   ANEMIA, MILD    CAD (coronary artery disease)    a. CAD,  b. s/p Cypher DES to the ramus and Promus DES x 2 to the RCA 08/2007 (minimal disease in LAD and CFX at that time), c. Myoview 8/10: EF 63%, inf thinning, small prior ant infarct, no ischemia;  d. s/p CABG 3/14 (L-LAD, RIMA-OM1)   DEGENERATIVE JOINT DISEASE, RIGHT HIP    DIVERTICULOSIS, COLON    ERECTILE DYSFUNCTION    GERD    Heart murmur    HIATAL HERNIA    HTN (hypertension)    Hyperlipidemia    statin intolerant   HYPERPLASIA PROSTATE UNS W/O UR OBST & OTH LUTS    LUMBAR RADICULOPATHY, RIGHT    Other abnormal glucose    PAD (peripheral artery disease) (HCC)    pre CABG 04/2012 => ABIs: Right 0.92, left 0.61 // ABI 09/02/14: R 0.93; L 0.82   SPINAL STENOSIS, LUMBAR    Past Surgical History:  Procedure Laterality Date   COLONOSCOPY  02/07/2000  negative   COLONOSCOPY  08/2017   TA, Dr Myrtie Neither   CORONARY ARTERY BYPASS GRAFT N/A 04/22/2012   Procedure: CORONARY ARTERY BYPASS GRAFTING (CABG);  Surgeon: Loreli Slot, MD;  Location: [facility] OR;  Service: Open Heart Surgery;  Laterality: N/A;  CABG X 2, BIMA, POSSIBLE EVH   CORONARY STENT PLACEMENT  02/07/2008    X 2   TEE WITHOUT CARDIOVERSION N/A 04/22/2012   Procedure: TRANSESOPHAGEAL ECHOCARDIOGRAM (TEE);  Surgeon: Loreli Slot, MD;  Location: [facility] OR;  Service: Open Heart Surgery;  Laterality: N/A;   TOTAL HIP ARTHROPLASTY Right 02/07/2007   UPPER GASTROINTESTINAL ENDOSCOPY       A IV Location/Drains/Wounds Patient Lines/Drains/Airways Status     Active Line/Drains/Airways     Name  Placement date Placement time Site Days   Peripheral IV 10/10/22 20 G Anterior;Left Forearm 10/10/22  1119  Forearm  less than 1            Intake/Output Last 24 hours No intake or output data in the 24 hours ending 10/10/22 2101  Labs/Imaging Results for orders placed or performed during the hospital encounter of 10/10/22 (from the past 48 hour(s))  Basic metabolic panel     Status: Abnormal   Collection Time: 10/10/22 11:40 AM  Result Value Ref Range   Sodium 141 135 - 145 mmol/L   Potassium 3.0 (L) 3.5 - 5.1 mmol/L   Chloride 103 98 - 111 mmol/L   CO2 21 (L) 22 - 32 mmol/L   Glucose, Bld 198 (H) 70 - 99 mg/dL    Comment: Glucose reference range applies only to samples taken after fasting for at least 8 hours.   BUN 19 8 - 23 mg/dL   Creatinine, Ser 1.61 0.61 - 1.24 mg/dL   Calcium 9.2 8.9 - 09.6 mg/dL   GFR, Estimated >04 >54 mL/min    Comment: (NOTE) Calculated using the CKD-EPI Creatinine Equation (2021)    Anion gap 17 (H) 5 - 15    Comment: Performed at [facility], [address], [place], [place] [address]  CBC WITH DIFFERENTIAL     Status: Abnormal   Collection Time: 10/10/22 11:40 AM  Result Value Ref Range   WBC 10.6 (H) 4.0 - 10.5 K/uL   RBC 4.26 4.22 - 5.81 MIL/uL   Hemoglobin 12.0 (L) 13.0 - 17.0 g/dL   HCT 91.4 (L) 78.2 - 95.6 %   MCV 90.4 80.0 - 100.0 fL   MCH 28.2 26.0 - 34.0 pg   MCHC 31.2 30.0 - 36.0 g/dL   RDW 21.3 08.6 - 57.8 %   Platelets 267 150 - 400 K/uL   nRBC 0.0 0.0 - 0.2 %   Neutrophils Relative % 66 %   Neutro Abs 7.1 1.7 - 7.7 K/uL   Lymphocytes Relative 22 %   Lymphs Abs 2.3 0.7 - 4.0 K/uL   Monocytes Relative 9 %   Monocytes Absolute 0.9 0.1 - 1.0 K/uL   Eosinophils Relative 1 %   Eosinophils Absolute 0.1 0.0 - 0.5 K/uL   Basophils Relative 1 %   Basophils Absolute 0.1 0.0 - 0.1 K/uL   Immature Granulocytes 1 %   Abs Immature Granulocytes 0.08 (H) 0.00 - 0.07 K/uL    Comment: Performed at [facility], [address], [place], [place] [address]  CBG monitoring, ED     Status: Abnormal   Collection Time: 10/10/22 12:32 PM  Result Value Ref Range   Glucose-Capillary 161 (H)  70 - 99 mg/dL    Comment: Glucose reference range applies only to samples taken after fasting for at least 8 hours.   CT Angio Head Neck W WO CM  Result Date: 10/10/2022 CLINICAL DATA:  Vertigo, central EXAM: CT ANGIOGRAPHY HEAD AND NECK WITH AND WITHOUT CONTRAST TECHNIQUE: Multidetector CT imaging of the head and neck was performed using the standard protocol during bolus administration of intravenous contrast. Multiplanar CT image reconstructions and MIPs were obtained to evaluate the vascular anatomy. Carotid stenosis measurements (when applicable) are obtained utilizing NASCET criteria, using the distal internal carotid diameter as the denominator. RADIATION DOSE REDUCTION: This exam was performed according to the departmental dose-optimization program which includes automated exposure control, adjustment of the mA and/or kV according to patient size and/or use of iterative reconstruction technique. CONTRAST:  75mL OMNIPAQUE IOHEXOL 350 MG/ML SOLN COMPARISON:  None Available. FINDINGS: CT HEAD FINDINGS Brain: Left cerebellar infarcts better characterized on same day MRI. No evidence of acute hemorrhage, midline shift or hydrocephalus. Vascular: See below. Skull: No acute fracture. Sinuses/Orbits: Clear sinuses.  No acute orbital findings. Other: No mastoid effusions. Review of the MIP images confirms the above findings CTA NECK FINDINGS Aortic arch: Great vessel origins are patent. Right carotid system: No evidence of dissection, stenosis (50% or greater), or occlusion. Left carotid system: No evidence of dissection, stenosis (50% or greater), or occlusion. Vertebral arteries: Codominant. No evidence of dissection, stenosis (50% or greater), or occlusion in the neck. Skeleton: Negative. Other neck: No evidence of acute abnormality on limited  assessment. Upper chest: Visualized lung apices are clear. Review of the MIP images confirms the above findings CTA HEAD FINDINGS Anterior circulation: Hypoplastic right A1 ACA, likely congenital. Bilateral intracranial ICAs, MCAs and ACAs are patent. Severe right and moderate left intracranial ICA stenosis. Moderate right M1 MCA stenosis. Posterior circulation: Linear filling defect in the proximal left intradural vertebral artery (for example see series 11, image 151; series 13, image 111). Intradural vertebral arteries are patent. The basilar artery and both posterior cerebral arteries are patent without proximal high-grade stenosis. Mild irregularity of the basilar artery, most likely atherosclerotic. Moderate right P2 PCA stenosis. Venous sinuses: As permitted by contrast timing, patent. Review of the MIP images confirms the above findings IMPRESSION: 1. Linear filling defect in the proximal left intradural vertebral artery is suspicious for dissection. 2. No large vessel occlusion. 3. Severe right and moderate left intracranial ICA stenosis. 4. Severe right vertebral artery origin stenosis. 5. Moderate right M1 MCA stenosis. 6. Moderate right P2 PCA stenosis. Findings discussed with Dr. Amada Jupiter via telephone at 7:30 p.m. Electronically Signed   By: Feliberto Harts M.D.   On: 10/10/2022 19:37   MR Brain Wo Contrast (neuro protocol)  Result Date: 10/10/2022 CLINICAL DATA:  Neuro deficit, acute, stroke suspected EXAM: MRI HEAD WITHOUT CONTRAST TECHNIQUE: Multiplanar, multiecho pulse sequences of the brain and surrounding structures were obtained without intravenous contrast. COMPARISON:  None Available. FINDINGS: Brain: There are acute infarcts in the left cerebellum (series 3, image 9-13). No hemorrhage. No hydrocephalus. No extra-axial fluid collection. Sequela of mild chronic microvascular ischemic change. No mass effect. No mass lesion. Chronic infarcts in the left corona radiata. Vascular: Normal  flow voids. Skull and upper cervical spine: Severe spinal canal stenosis C4-C5 Sinuses/Orbits: No middle ear or mastoid effusion. Paranasal sinuses are clear. Orbits are unremarkable. Other: None. IMPRESSION: 1. Acute infarcts in the left cerebellum. No hemorrhage. 2. Severe spinal canal stenosis at C4-C5. Consider further evaluation with a  cervical spine MRI. Electronically Signed   By: Lorenza Cambridge M.D.   On: 10/10/2022 14:38    Pending Labs Unresulted Labs (From admission, onward)     Start     Ordered   10/17/22 0500  Creatinine, serum  (enoxaparin (LOVENOX)    CrCl >/= 30 ml/min)  Weekly,   R     Comments: while on enoxaparin therapy    10/10/22 1759   10/11/22 0500  Lipid panel  (Labs)  Tomorrow morning,   R       Comments: Fasting    10/10/22 1759   10/11/22 0500  Hemoglobin A1c  (Labs)  Tomorrow morning,   R       Comments: To assess prior glycemic control    10/10/22 1759            Vitals/Pain Today's Vitals   10/10/22 1645 10/10/22 1803 10/10/22 1836 10/10/22 2041  BP: (!) 145/83  (!) 150/91 (!) 145/85  Pulse: 73  77 77  Resp: (!) 21  19 18   Temp:    (!) 97 F (36.1 C)  TempSrc:    Oral  SpO2: 99%  100%   Weight:  80.7 kg    Height:  6\' 2"  (1.88 m)    PainSc:        Isolation Precautions No active isolations  Medications Medications  ezetimibe (ZETIA) tablet 10 mg (has no administration in time range)  tamsulosin (FLOMAX) capsule 0.4 mg (has no administration in time range)   stroke: early stages of recovery book (has no administration in time range)  acetaminophen (TYLENOL) tablet 650 mg (has no administration in time range)    Or  acetaminophen (TYLENOL) 160 MG/5ML solution 650 mg (has no administration in time range)    Or  acetaminophen (TYLENOL) suppository 650 mg (has no administration in time range)  enoxaparin (LOVENOX) injection 40 mg (40 mg Subcutaneous Given 10/10/22 1835)  clopidogrel (PLAVIX) tablet 75 mg (75 mg Oral Given 10/10/22 1835)   aspirin 325 MG tablet (  Not Given 10/10/22 1842)  ondansetron (ZOFRAN) injection 4 mg (4 mg Intravenous Given 10/10/22 2007)  sodium chloride 0.9 % bolus 500 mL (0 mLs Intravenous Stopped 10/10/22 1512)  meclizine (ANTIVERT) tablet 25 mg (25 mg Oral Given 10/10/22 1233)  metoCLOPramide (REGLAN) injection 5 mg (5 mg Intravenous Given 10/10/22 1240)  potassium chloride SA (KLOR-CON M) CR tablet 40 mEq (40 mEq Oral Given 10/10/22 1535)  iohexol (OMNIPAQUE) 350 MG/ML injection 75 mL (75 mLs Intravenous Contrast Given 10/10/22 1707)    Mobility walks     Focused Assessments Cardiac Assessment Handoff:  Cardiac Rhythm: Normal sinus rhythm Lab Results  Component Value Date   CKTOTAL 138 08/29/2007   CKMB 2.1 08/29/2007   TROPONINI (H) 08/29/2007    0.10        PERSISTENTLY INCREASED TROPONIN VALUES IN THE RANGE OF 0.06-0.49 ng/mL CAN BE SEEN IN:       -UNSTABLE ANGINA   No results found for: "DDIMER" Does the Patient currently have chest pain? No   , Neuro Assessment Handoff:  Swallow screen pass? Yes  Cardiac Rhythm: Normal sinus rhythm NIH Stroke Scale  Dizziness Present: Yes Headache Present: No Interval: Initial Level of Consciousness (1a.)   : Alert, keenly responsive LOC Questions (1b. )   : Answers both questions correctly LOC Commands (1c. )   : Performs both tasks correctly Best Gaze (2. )  : Normal Visual (3. )  : No visual loss  Facial Palsy (4. )    : Normal symmetrical movements Motor Arm, Left (5a. )   : No drift Motor Arm, Right (5b. ) : No drift Motor Leg, Left (6a. )  : No drift Motor Leg, Right (6b. ) : No drift Limb Ataxia (7. ): Absent Sensory (8. )  : Normal, no sensory loss Best Language (9. )  : No aphasia Dysarthria (10. ): Normal Extinction/Inattention (11.)   : No Abnormality Complete NIHSS TOTAL: 0     Neuro Assessment: Exceptions to WDL Neuro Checks:   Initial (10/10/22 1917)  Has TPA been given? No If patient is a Neuro Trauma and patient is  going to OR before floor call report to 4N Charge nurse: [phone] or [phone]  , Pulmonary Assessment Handoff:  Lung sounds:   O2 Device: Room Air      R Recommendations: See Admitting Provider Note  Report given to:   Additional Notes:

## 2022-10-10 NOTE — H&P (Addendum)
History and Physical    Patient: Eric Dickerson UVO:[id number] DOB: 04-16-1950 DOA: 10/10/2022 DOS: the patient was seen and examined on 10/10/2022 at 3:30 PM PCP: [name], MD  Patient coming from: Home  Chief Complaint:  Chief Complaint  Patient presents with   Dizziness       HPI:  Eric Dickerson is a 72 y.o. M with hx CAD s/p CABG 15 years ago, HLD statin intolerant, and HTN who presented with acute dizziness, blurry vision.  Patient was at work today when he had abrupt onset of dizziness and vision change.  This did not get better so he came to the ER.  In the ER, MRI brain showed cerebellar stroke.        Review of Systems  Eyes:  Positive for blurred vision.  Neurological:  Positive for dizziness. Negative for sensory change, speech change, focal weakness, seizures and loss of consciousness.  All other systems reviewed and are negative.    Past Medical History:  Diagnosis Date   ANEMIA, MILD    CAD (coronary artery disease)    a. CAD,  b. s/p Cypher DES to the ramus and Promus DES x 2 to the RCA 08/2007 (minimal disease in LAD and CFX at that time), c. Myoview 8/10: EF 63%, inf thinning, small prior ant infarct, no ischemia;  d. s/p CABG 3/14 (L-LAD, RIMA-OM1)   DEGENERATIVE JOINT DISEASE, RIGHT HIP    DIVERTICULOSIS, COLON    ERECTILE DYSFUNCTION    GERD    Heart murmur    HIATAL HERNIA    HTN (hypertension)    Hyperlipidemia    statin intolerant   HYPERPLASIA PROSTATE UNS W/O UR OBST & OTH LUTS    LUMBAR RADICULOPATHY, RIGHT    Other abnormal glucose    PAD (peripheral artery disease) (HCC)    pre CABG 04/2012 => ABIs: Right 0.92, left 0.61 // ABI 09/02/14: R 0.93; L 0.82   SPINAL STENOSIS, LUMBAR    Past Surgical History:  Procedure Laterality Date   COLONOSCOPY  02/07/2000   negative   COLONOSCOPY  08/2017   TA, Dr Myrtie Neither   CORONARY ARTERY BYPASS GRAFT N/A 04/22/2012   Procedure: CORONARY ARTERY BYPASS GRAFTING (CABG);  Surgeon: Loreli Slot, MD;   Location: [facility] OR;  Service: Open Heart Surgery;  Laterality: N/A;  CABG X 2, BIMA, POSSIBLE EVH   CORONARY STENT PLACEMENT  02/07/2008    X 2   TEE WITHOUT CARDIOVERSION N/A 04/22/2012   Procedure: TRANSESOPHAGEAL ECHOCARDIOGRAM (TEE);  Surgeon: Loreli Slot, MD;  Location: [facility] OR;  Service: Open Heart Surgery;  Laterality: N/A;   TOTAL HIP ARTHROPLASTY Right 02/07/2007   UPPER GASTROINTESTINAL ENDOSCOPY     Social History:  reports that he quit smoking about 37 years ago. His smoking use included cigarettes. He started smoking about 49 years ago. He has a 18 pack-year smoking history. He has never used smokeless tobacco. He reports that he does not drink alcohol and does not use drugs.  Allergies  Allergen Reactions   Accupril [Quinapril Hcl] Cough   Ace Inhibitors Other (See Comments)    ACE inhibitors are contraindicated because of a history of rash with Angiotensin receptor blocker.   Calan [Verapamil] Other (See Comments)    Sun sensitivity   Crestor [Rosuvastatin] Other (See Comments)    Myalgias Cramps   Glucosamine Other (See Comments)    Unknown reaction   Lipitor [Atorvastatin] Other (See Comments)    Myalgias  Cramps  Livalo [Pitavastatin] Other (See Comments)    Cramps   Pravachol [Pravastatin] Other (See Comments)    Myalgias Cramps   Statins Other (See Comments)    Myalgias, cramping with: Crestor, Lipitor, Livalo, Pravachol.   Zetia [Ezetimibe] Other (See Comments)    Myalgias Cramps  Pt ok on 3x weekly dose   Benicar [Olmesartan] Rash    Family History  Problem Relation Age of Onset   Hyperlipidemia Mother    Hypertension Father    Heart attack Father 83       smoker   Stroke Brother        2 brothers ; 4 & 47   Hyperlipidemia Brother    Pulmonary embolism Brother    Heart attack Brother 65   Diabetes Maternal Grandmother    Cancer Neg Hx    COPD Neg Hx    Colon cancer Neg Hx    Esophageal cancer Neg Hx    Stomach cancer Neg Hx     Rectal cancer Neg Hx    Colon polyps Neg Hx     Prior to Admission medications   Medication Sig Start Date End Date Taking? Authorizing Provider  acetaminophen (TYLENOL) 500 MG tablet Take 500 mg by mouth daily as needed for moderate pain, fever or headache.   Yes [provider]  amLODipine (NORVASC) 10 MG tablet TAKE 1 TABLET BY MOUTH DAILY 03/28/22  Yes [name], MD  ezetimibe (ZETIA) 10 MG tablet Take 10 mg by mouth 3 (three) times a week.   Yes [provider]  meclizine (ANTIVERT) 12.5 MG tablet Take 1 tablet (12.5 mg total) by mouth 3 (three) times daily as needed for dizziness. 08/07/22 08/07/23 Yes [name], MD  Multiple Vitamins-Minerals (CENTRUM SILVER 50+MEN) TABS Take 1 tablet by mouth daily.   Yes [provider]  tamsulosin (FLOMAX) 0.4 MG CAPS capsule Take 0.4 mg by mouth at bedtime. 03/09/15  Yes [provider]  nitroGLYCERIN (NITROSTAT) 0.4 MG SL tablet Take 1 tab under your tongue for chest pain  If no relief of pain may repeat NTG, one tab every 5 minutes up to 3 tablets total over 15 minutes. Patient not taking: Reported on 10/10/2022 04/26/21   [name], MD    Physical Exam: Vitals:   10/10/22 1430 10/10/22 1445 10/10/22 1515 10/10/22 1616  BP: (!) 134/91 (!) 146/88 (!) 154/86 (!) 147/90  Pulse: 72 83 70 71  Resp: 18 16 20  (!) 22  Temp:    (!) 97.2 F (36.2 C)  TempSrc:    Oral  SpO2: 96% 100% 100% 100%   General appearance:   alert and in no distress.  Responds appropriately to questions.  Eye contact, dress and hygiene appropriate. HEENT:  Anicteric, conjunctivae and sclerae normal without injection or icterus, lids and lashes normal.  OP moist without lesions, dentition normal, lips normal, normal auditory acuity   Cor:  RRR, without murmurs, rubs.  JVP normal, no LE edema Resp:  Normal respiratory rate and rhythm.  CTAB without rales or wheezes. Abd:  No TTP or rebound all quadrants.  No masses or organomegaly.   No  ascites, distension.  Neuro: Pupils equal, extraocular movements intact but with marked nystagmus.  Cranial nerve VII, IX, and 10 equal, cranial nerve XI and XII normal.    Motor strength testing is 5/5 in the upper and lower extremities bilaterally with normal motor, tone and bulk. The patient is oriented to time, place and person.  Speech is fluent.  Naming is grossly intact.  Recall, recent and remote, as well as general fund of knowledge seem within normal limits.  Attention span and concentration are within normal limits.          Data Reviewed: Patient metabolic panel shows hypokalemia, mild hyperglycemia CBC unremarkable EKG, personally reviewed, shows old right bundle branch block MRI of the brain report reviewed, shows acute left cerebellar infarct    Assessment and Plan: * Acute ischemic stroke (HCC) MRI brain showed acute infarcts in the left cerebellum - Non-invasive angiography ordered - Echocardiogram ordered - Lipids ordered - Aspirin intolerant; continue Plavix -Permissive hypertension - Atrial fibrillation: Not present on EKG, no previous history - tPA not given because outside the window - Dysphagia screen ordered in ER - PT eval ordered - Nonsmoker   Hypokalemia Potassium supplemented - Trend BMP  HTN (hypertension) - Hold home amlodipine - Permissive hypertension  PAD (peripheral artery disease) (HCC) - Continue Zetia - Statin intolerant  BPH (benign prostatic hyperplasia) Continue Flomax         Advance Care Planning: Full code, confirmed with family  Consults: Neurology  Family Communication: Wife and daughters at the bedside  Severity of Illness: The appropriate patient status for this patient is OBSERVATION. Observation status is judged to be reasonable and necessary in order to provide the required intensity of service to ensure the patient's safety. The patient's presenting symptoms, physical exam findings, and initial radiographic  and laboratory data in the context of their medical condition is felt to place them at decreased risk for further clinical deterioration. Furthermore, it is anticipated that the patient will be medically stable for discharge from the hospital within 2 midnights of admission.   Author: Alberteen Sam, MD 10/10/2022 6:01 PM  For on call review [email or web address].

## 2022-10-11 ENCOUNTER — Observation Stay (HOSPITAL_COMMUNITY): Payer: Medicare PPO

## 2022-10-11 DIAGNOSIS — I6502 Occlusion and stenosis of left vertebral artery: Secondary | ICD-10-CM | POA: Diagnosis present

## 2022-10-11 DIAGNOSIS — Z8249 Family history of ischemic heart disease and other diseases of the circulatory system: Secondary | ICD-10-CM | POA: Diagnosis not present

## 2022-10-11 DIAGNOSIS — I6389 Other cerebral infarction: Secondary | ICD-10-CM

## 2022-10-11 DIAGNOSIS — I63542 Cerebral infarction due to unspecified occlusion or stenosis of left cerebellar artery: Secondary | ICD-10-CM | POA: Diagnosis not present

## 2022-10-11 DIAGNOSIS — R7303 Prediabetes: Secondary | ICD-10-CM | POA: Diagnosis not present

## 2022-10-11 DIAGNOSIS — Z87891 Personal history of nicotine dependence: Secondary | ICD-10-CM | POA: Diagnosis not present

## 2022-10-11 DIAGNOSIS — Z888 Allergy status to other drugs, medicaments and biological substances status: Secondary | ICD-10-CM | POA: Diagnosis not present

## 2022-10-11 DIAGNOSIS — E876 Hypokalemia: Secondary | ICD-10-CM | POA: Diagnosis present

## 2022-10-11 DIAGNOSIS — K219 Gastro-esophageal reflux disease without esophagitis: Secondary | ICD-10-CM | POA: Diagnosis present

## 2022-10-11 DIAGNOSIS — I2510 Atherosclerotic heart disease of native coronary artery without angina pectoris: Secondary | ICD-10-CM | POA: Diagnosis present

## 2022-10-11 DIAGNOSIS — I10 Essential (primary) hypertension: Secondary | ICD-10-CM

## 2022-10-11 DIAGNOSIS — M4802 Spinal stenosis, cervical region: Secondary | ICD-10-CM | POA: Diagnosis present

## 2022-10-11 DIAGNOSIS — I25810 Atherosclerosis of coronary artery bypass graft(s) without angina pectoris: Secondary | ICD-10-CM | POA: Diagnosis not present

## 2022-10-11 DIAGNOSIS — Z823 Family history of stroke: Secondary | ICD-10-CM | POA: Diagnosis not present

## 2022-10-11 DIAGNOSIS — I7774 Dissection of vertebral artery: Secondary | ICD-10-CM | POA: Diagnosis present

## 2022-10-11 DIAGNOSIS — R42 Dizziness and giddiness: Secondary | ICD-10-CM | POA: Diagnosis not present

## 2022-10-11 DIAGNOSIS — H9192 Unspecified hearing loss, left ear: Secondary | ICD-10-CM | POA: Diagnosis present

## 2022-10-11 DIAGNOSIS — Z96641 Presence of right artificial hip joint: Secondary | ICD-10-CM | POA: Diagnosis present

## 2022-10-11 DIAGNOSIS — I739 Peripheral vascular disease, unspecified: Secondary | ICD-10-CM

## 2022-10-11 DIAGNOSIS — R7989 Other specified abnormal findings of blood chemistry: Secondary | ICD-10-CM | POA: Diagnosis not present

## 2022-10-11 DIAGNOSIS — I639 Cerebral infarction, unspecified: Secondary | ICD-10-CM | POA: Diagnosis not present

## 2022-10-11 DIAGNOSIS — R739 Hyperglycemia, unspecified: Secondary | ICD-10-CM | POA: Diagnosis present

## 2022-10-11 DIAGNOSIS — N401 Enlarged prostate with lower urinary tract symptoms: Secondary | ICD-10-CM

## 2022-10-11 DIAGNOSIS — Z833 Family history of diabetes mellitus: Secondary | ICD-10-CM | POA: Diagnosis not present

## 2022-10-11 DIAGNOSIS — D72829 Elevated white blood cell count, unspecified: Secondary | ICD-10-CM | POA: Diagnosis present

## 2022-10-11 DIAGNOSIS — Z83438 Family history of other disorder of lipoprotein metabolism and other lipidemia: Secondary | ICD-10-CM | POA: Diagnosis not present

## 2022-10-11 DIAGNOSIS — N400 Enlarged prostate without lower urinary tract symptoms: Secondary | ICD-10-CM | POA: Diagnosis present

## 2022-10-11 DIAGNOSIS — Z951 Presence of aortocoronary bypass graft: Secondary | ICD-10-CM | POA: Diagnosis not present

## 2022-10-11 DIAGNOSIS — R339 Retention of urine, unspecified: Secondary | ICD-10-CM | POA: Diagnosis not present

## 2022-10-11 DIAGNOSIS — Z955 Presence of coronary angioplasty implant and graft: Secondary | ICD-10-CM | POA: Diagnosis not present

## 2022-10-11 DIAGNOSIS — E785 Hyperlipidemia, unspecified: Secondary | ICD-10-CM | POA: Diagnosis present

## 2022-10-11 DIAGNOSIS — Z79899 Other long term (current) drug therapy: Secondary | ICD-10-CM | POA: Diagnosis not present

## 2022-10-11 LAB — RAPID URINE DRUG SCREEN, HOSP PERFORMED
Amphetamines: NOT DETECTED
Barbiturates: NOT DETECTED
Benzodiazepines: NOT DETECTED
Cocaine: NOT DETECTED
Opiates: NOT DETECTED
Tetrahydrocannabinol: NOT DETECTED

## 2022-10-11 LAB — LIPID PANEL
Cholesterol: 222 mg/dL — ABNORMAL HIGH (ref 0–200)
HDL: 58 mg/dL (ref >40)
LDL Cholesterol: 152 mg/dL — ABNORMAL HIGH (ref 0–99)
Total CHOL/HDL Ratio: 3.8 ratio
Triglycerides: 59 mg/dL (ref <150)
VLDL: 12 mg/dL (ref 0–40)

## 2022-10-11 LAB — ECHOCARDIOGRAM COMPLETE
Area-P 1/2: 2.87 cm2
Height: 74 in
S' Lateral: 2.9 cm
Weight: 2723.12 [oz_av]

## 2022-10-11 LAB — HEMOGLOBIN A1C
Hgb A1c MFr Bld: 5.7 % — ABNORMAL HIGH (ref 4.8–5.6)
Mean Plasma Glucose: 116.89 mg/dL

## 2022-10-11 MED ORDER — ASPIRIN 81 MG PO TBEC
81.0000 mg | DELAYED_RELEASE_TABLET | Freq: Every day | ORAL | Status: DC
  Administered 2022-10-11 – 2022-10-12 (×2): 81 mg via ORAL
  Filled 2022-10-11 (×2): qty 1

## 2022-10-11 NOTE — Evaluation (Signed)
Occupational Therapy Evaluation Patient Details Name: Eric Dickerson MRN: [id number] DOB: 04-Jan-1951 Today's Date: 10/11/2022   History of Present Illness Pt is a 72 y/o M presenting with acute dizziness and blurry vision. MRI revealed L cerebellar stroke. PMH includes CAD s/p CABG 15 years ago, HLD statin intolerant, and HTN   Clinical Impression   PTA, pt lives with spouse, typically completely Independent and employed part time. Pt presents now with deficits in balance, strength and vision. Overall, pt requires up to Mod A for bathroom mobility using RW and up to Mod A for LB ADLs. Pt with frequent LOB with mobility and physical assist needed to correct balance throughout. Due to balance deficits, pt is at high risk for falls and requires constant hands on assist in standing. As pt is below high PLOF, feel pt would benefit from intensive rehab services at [place].      If plan is discharge home, recommend the following: A lot of help with walking and/or transfers;A lot of help with bathing/dressing/bathroom;Assistance with cooking/housework    Functional Status Assessment  Patient has had a recent decline in their functional status and demonstrates the ability to make significant improvements in function in a reasonable and predictable amount of time.  Equipment Recommendations  Other (comment) (TBD pending progress)    Recommendations for Other Services Rehab consult     Precautions / Restrictions Precautions Precautions: Fall Restrictions Weight Bearing Restrictions: No      Mobility Bed Mobility Overal bed mobility: Needs Assistance Bed Mobility: Supine to Sit, Sit to Supine     Supine to sit: Supervision Sit to supine: Supervision        Transfers Overall transfer level: Needs assistance Equipment used: Rolling walker (2 wheels) Transfers: Sit to/from Stand Sit to Stand: Min assist                  Balance Overall balance assessment: Needs  assistance Sitting-balance support: Feet supported, No upper extremity supported Sitting balance-Leahy Scale: Fair     Standing balance support: Bilateral upper extremity supported, Reliant on assistive device for balance, During functional activity Standing balance-Leahy Scale: Poor                             ADL either performed or assessed with clinical judgement   ADL Overall ADL's : Needs assistance/impaired Eating/Feeding: Independent   Grooming: Minimal assistance;Standing   Upper Body Bathing: Set up;Sitting   Lower Body Bathing: Moderate assistance;Sitting/lateral leans;Sit to/from stand   Upper Body Dressing : Set up;Sitting   Lower Body Dressing: Moderate assistance;Sitting/lateral leans;Sit to/from stand   Toilet Transfer: Minimal assistance;Moderate assistance;Ambulation;Rolling walker (2 wheels) Toilet Transfer Details (indicate cue type and reason): frequent LOB walking to bathroom with RW, improving on bathroom exit though at least Min A still needed for balance correction. Min A to stand from lower toilet with use of grab bars Toileting- Clothing Manipulation and Hygiene: Minimal assistance;Sitting/lateral lean;Sit to/from stand Toileting - Clothing Manipulation Details (indicate cue type and reason): clothing mgmt assist     Functional mobility during ADLs: Minimal assistance;Moderate assistance;Rolling walker (2 wheels);Cueing for sequencing;Cueing for safety       Vision Baseline Vision/History: 1 Wears glasses Ability to See in Adequate Light: 1 Impaired Patient Visual Report: Blurring of vision Vision Assessment?: Vision impaired- to be further tested in functional context Additional Comments: to be further assessed. endorses vision better than admission, requires increased time to focus gaze due  to blurriness     Perception         Praxis         Pertinent Vitals/Pain Pain Assessment Pain Assessment: No/denies pain      Extremity/Trunk Assessment Upper Extremity Assessment Upper Extremity Assessment: Right hand dominant;RUE deficits/detail RUE Deficits / Details: R shoulder mildly weaker than L UE at 3+/5. some impaired coordination with increased time to manage smaller movements RUE Coordination: decreased fine motor   Lower Extremity Assessment Lower Extremity Assessment: Defer to PT evaluation   Cervical / Trunk Assessment Cervical / Trunk Assessment: Normal   Communication Communication Communication: No apparent difficulties Cueing Techniques: Verbal cues   Cognition Arousal: Alert Behavior During Therapy: WFL for tasks assessed/performed Overall Cognitive Status: Impaired/Different from baseline Area of Impairment: Safety/judgement, Attention, Problem solving, Awareness                   Current Attention Level: Selective     Safety/Judgement: Decreased awareness of safety, Decreased awareness of deficits Awareness: Emergent Problem Solving: Difficulty sequencing, Requires verbal cues General Comments: cues for safety and DME use, showing insight into balance deficits but not the severity of it. very pleasant     General Comments  Wife entering during session    Exercises     Shoulder Instructions      Home Living Family/patient expects to be discharged to:: Private residence Living Arrangements: Spouse/significant other Available Help at Discharge: Family Type of Home: House Home Access: Stairs to enter Secretary/administrator of Steps: small threshold   Home Layout: One level     Bathroom Shower/Tub: Producer, television/film/video: Standard Bathroom Accessibility: Yes   Home Equipment: Agricultural consultant (2 wheels);Shower seat - built in (need to confirm built in shower seat)   Additional Comments: has RW from hip replacement      Prior Functioning/Environment Prior Level of Function : Independent/Modified Independent             Mobility  Comments: indepedent without AD prior to admission ADLs Comments: working part time at [employer] location, enjoys fishing and taking his boat out        OT Problem List: Decreased strength;Decreased activity tolerance;Impaired balance (sitting and/or standing);Decreased coordination;Decreased knowledge of use of DME or AE;Decreased safety awareness      OT Treatment/Interventions: Self-care/ADL training;Therapeutic exercise;Energy conservation;DME and/or AE instruction;Therapeutic activities    OT Goals(Current goals can be found in the care plan section) Acute Rehab OT Goals Patient Stated Goal: improve balance, prefer rehab here at hospital OT Goal Formulation: With patient/family Time For Goal Achievement: 10/25/22 Potential to Achieve Goals: Good  OT Frequency: Min 1X/week    Co-evaluation              AM-PAC OT "6 Clicks" Daily Activity     Outcome Measure Help from another person eating meals?: None Help from another person taking care of personal grooming?: A Little Help from another person toileting, which includes using toliet, bedpan, or urinal?: A Little Help from another person bathing (including washing, rinsing, drying)?: A Lot Help from another person to put on and taking off regular upper body clothing?: A Little Help from another person to put on and taking off regular lower body clothing?: A Lot 6 Click Score: 17   End of Session Equipment Utilized During Treatment: Gait belt;Rolling walker (2 wheels)  Activity Tolerance: Patient tolerated treatment well Patient left: in bed;with call bell/phone within reach;with bed alarm set;with family/visitor present  OT Visit  Diagnosis: Unsteadiness on feet (R26.81);Other abnormalities of gait and mobility (R26.89);Muscle weakness (generalized) (M62.81)                Time: 1093-2355 OT Time Calculation (min): 19 min Charges:  OT General Charges $OT Visit: 1 Visit OT Evaluation $OT Eval Moderate  Complexity: 1 Mod  Bradd Canary, OTR/L Acute Rehab Services Office: [phone]   Lorre Munroe 10/11/2022, 10:54 AM

## 2022-10-11 NOTE — Progress Notes (Signed)
  Inpatient Rehabilitation Admissions Coordinator   Met with wife and daughter at bedside for rehab assessment. Patient off floor in testing. We discussed goals and expectations of a possible CIR admit. They prefer CIR for rehab. Family can provide expected caregiver support that is recommended of supervision level. I will begin insurance Auth with [facility] Medicare for possible CIR admit pending approval. Please call me with any questions.   Ottie Glazier, RN, MSN Rehab Admissions Coordinator [phone]

## 2022-10-11 NOTE — Evaluation (Signed)
Speech Language Pathology Evaluation Patient Details Name: Eric Dickerson MRN: [id number] DOB: March 04, 1950 Today's Date: 10/11/2022 Time: 7829-5621 SLP Time Calculation (min) (ACUTE ONLY): 14 min  Problem List:  Patient Active Problem List   Diagnosis Date Noted   Vertebral artery dissection (HCC) 10/11/2022   Acute ischemic stroke (HCC) 10/10/2022   Hypokalemia 10/10/2022   Low magnesium levels 04/01/2022   Vitamin D deficiency 03/27/2021   Statin intolerance 03/24/2021   Vertigo 08/31/2020   OAB (overactive bladder) 05/03/2020   Statin myopathy 05/06/2019   Low back pain 04/22/2019   Pain in joint of right hip 12/12/2017   Elevated PSA 03/01/2017   CAD (coronary artery disease)    HTN (hypertension)    Hyperlipidemia    Impacted cerumen of both ears 11/24/2015   Encounter for well adult exam with abnormal findings 03/29/2015   Allergic rhinitis 03/23/2015   Solar urticaria 07/30/2014   Primary localized osteoarthrosis, lower leg 07/15/2013   Acute medial meniscal tear 07/15/2013   Other abnormal glucose 09/06/2012   Urgency of urination 09/06/2012   PAD (peripheral artery disease) (HCC) 06/28/2011   ANEMIA, MILD 02/02/2010   Urinary frequency 02/02/2010   SPINAL STENOSIS, LUMBAR 08/05/2008   LUMBAR RADICULOPATHY, RIGHT 07/29/2008   HIATAL HERNIA 08/19/2007   DIVERTICULOSIS, COLON 08/19/2007   GERD 05/06/2007   ERECTILE DYSFUNCTION 01/22/2007   BPH (benign prostatic hyperplasia) 01/22/2007   DEGENERATIVE JOINT DISEASE, RIGHT HIP 01/22/2007   Past Medical History:  Past Medical History:  Diagnosis Date   ANEMIA, MILD    CAD (coronary artery disease)    a. CAD,  b. s/p Cypher DES to the ramus and Promus DES x 2 to the RCA 08/2007 (minimal disease in LAD and CFX at that time), c. Myoview 8/10: EF 63%, inf thinning, small prior ant infarct, no ischemia;  d. s/p CABG 3/14 (L-LAD, RIMA-OM1)   DEGENERATIVE JOINT DISEASE, RIGHT HIP    DIVERTICULOSIS, COLON    ERECTILE  DYSFUNCTION    GERD    Heart murmur    HIATAL HERNIA    HTN (hypertension)    Hyperlipidemia    statin intolerant   HYPERPLASIA PROSTATE UNS W/O UR OBST & OTH LUTS    LUMBAR RADICULOPATHY, RIGHT    Other abnormal glucose    PAD (peripheral artery disease) (HCC)    pre CABG 04/2012 => ABIs: Right 0.92, left 0.61 // ABI 09/02/14: R 0.93; L 0.82   SPINAL STENOSIS, LUMBAR    Past Surgical History:  Past Surgical History:  Procedure Laterality Date   COLONOSCOPY  02/07/2000   negative   COLONOSCOPY  08/2017   TA, Dr Myrtie Neither   CORONARY ARTERY BYPASS GRAFT N/A 04/22/2012   Procedure: CORONARY ARTERY BYPASS GRAFTING (CABG);  Surgeon: Loreli Slot, MD;  Location: [facility] OR;  Service: Open Heart Surgery;  Laterality: N/A;  CABG X 2, BIMA, POSSIBLE EVH   CORONARY STENT PLACEMENT  02/07/2008    X 2   TEE WITHOUT CARDIOVERSION N/A 04/22/2012   Procedure: TRANSESOPHAGEAL ECHOCARDIOGRAM (TEE);  Surgeon: Loreli Slot, MD;  Location: [facility] OR;  Service: Open Heart Surgery;  Laterality: N/A;   TOTAL HIP ARTHROPLASTY Right 02/07/2007   UPPER GASTROINTESTINAL ENDOSCOPY     HPI:  [name] is a 72 y.o. M presented to ED with abrupt onset of dizziness and visual changs. Pt with hx CAD s/p CABG 15 years ago, HLD statin intolerant, and HTN. MRI in ED noted cerebellar stroke.   Assessment / Plan / Recommendation  Clinical Impression  Pt seen for cognitive-linguistic assessment. Assessment completed via informal means and SLUMS. Pt scored 20/30 on SLUMS. Pt with notable difficulty with attention (sustained/focused), memory (immediate, short term, working), and executive functioning (self-monitoring/self awareness). Pt would benefit from ongoing SLP services acute and post-acute for above mentioned deficits.    SLP Assessment  SLP Recommendation/Assessment: Patient needs continued Speech Lanaguage Pathology Services SLP Visit Diagnosis: Cognitive communication deficit (R41.841)    Recommendations  for follow up therapy are one component of a multi-disciplinary discharge planning process, led by the attending physician.  Recommendations may be updated based on patient status, additional functional criteria and insurance authorization.    Follow Up Recommendations  Acute inpatient rehab (3hours/day)    Assistance Recommended at Discharge  Frequent or constant Supervision/Assistance  Functional Status Assessment Patient has had a recent decline in their functional status and demonstrates the ability to make significant improvements in function in a reasonable and predictable amount of time.  Frequency and Duration min 2x/week  2 weeks      SLP Evaluation Cognition  Overall Cognitive Status: Impaired/Different from baseline Orientation Level: Oriented X4 Attention: Sustained Sustained Attention: Impaired Sustained Attention Impairment: Verbal basic Memory: Impaired Memory Impairment: Storage deficit;Retrieval deficit;Decreased recall of new information;Decreased short term memory (working memory) Decreased Short Term Memory: Verbal basic Awareness: Appears intact Problem Solving: Impaired Problem Solving Impairment: Functional complex Executive Function: Self Monitoring;Self Correcting Reasoning: Impaired Self Monitoring: Impaired Self Correcting: Impaired       Comprehension  Auditory Comprehension Overall Auditory Comprehension: Appears within functional limits for tasks assessed Reading Comprehension Reading Status: Not tested    Expression Expression Primary Mode of Expression: Verbal Verbal Expression Overall Verbal Expression: Appears within functional limits for tasks assessed Initiation: Impaired Automatic Speech: Name;Social Response Level of Generative/Spontaneous Verbalization: Sentence;Conversation Repetition: No impairment Naming: No impairment (with exceptional of generative naming subtest of SLUMS) Pragmatics: No impairment Written Expression Dominant  Hand: Right Written Expression:  (WFL for clockdrawing)   Oral / Motor  Oral Motor/Sensory Function Overall Oral Motor/Sensory Function: Within functional limits Motor Speech Overall Motor Speech: Appears within functional limits for tasks assessed Respiration: Within functional limits Phonation: Normal (mildly hoarse, but functional) Resonance: Within functional limits Articulation: Within functional limitis Intelligibility: Intelligible Motor Planning: Witnin functional limits           Clyde Canterbury, M.S., CCC-SLP Speech-Language Pathologist Secure Chat Preferred  O: [phone]  Woodroe Chen 10/11/2022, 10:09 AM

## 2022-10-11 NOTE — Progress Notes (Signed)
  Progress Note   Patient: Eric Dickerson:[id number] DOB: 04-12-1950 DOA: 10/10/2022     0 DOS: the patient was seen and examined on 10/11/2022 at 11:20 AM      Brief hospital course: Eric Dickerson is a 72 y.o. M with hx CAD s/p CABG 15 years ago, HLD statin intolerant, and HTN who presented with acute dizziness, blurry vision.  In the ER, MRI brain showed cerebellar stroke.       Assessment and Plan: * Acute ischemic stroke (HCC) MRI brain showed acute infarcts in the left cerebellum - Non-invasive angiography showed advanced atherosclerosis versus dissection of the left vertebral artery - Echocardiogram pending - Lipids ordered: LDL 152, previously extensively statin intolerant - Continue Zetia, outpatient referral for PCSK9 inhibitor - Aspirin and Plavix for 3 months - Permissive hypertension - Atrial fibrillation: Not on monitoring - tPA not given because outside the window - Dysphagia screen ordered in ER - PT eval ordered: Recommending inpatient rehab - Nonsmoker   Vertebral artery dissection (HCC)    Hypokalemia - Check BMP tomorrow  HTN (hypertension) - Hold home amlodipine - Permissive hypertension  PAD (peripheral artery disease) (HCC) - Continue Zetia - Statin intolerant - Aspirin and Plavix  BPH (benign prostatic hyperplasia) - Continue Flomax          Subjective: Patient has hearing loss in his left ear, his vision is still somewhat altered, he is less dizzy than yesterday.  No focal weakness, speech change, confusion, loss of consciousness.  No nursing concerns.     Physical Exam: BP 136/79 (BP Location: Right Arm)   Pulse 65   Temp 98.1 F (36.7 C) (Oral)   Resp 20   Ht 6\' 2"  (1.88 m)   Wt 77.2 kg   SpO2 100%   BMI 21.85 kg/m   Thin adult male, lying in bed, interactive and appropriate RRR, soft systolic murmur noted, no peripheral edema Respiratory rate normal, lungs clear without rales or wheezes Attention normal, affect pleasant,  judgment insight appear normal, oriented to person, place, and time.  He has nystagmus when looking to the right, none when looking to the left.  He has equal strength, speech fluent    Data Reviewed: Discussed with neurology LDL 152 Hemoglobin A1c 5.7%  Family Communication: Wife at the bedside    Disposition: Status is: Inpatient The patient was admitted for cerebellar stroke on MRI.  He still has difficulty with ambulation, and at present is not safe to discharge to his former level of care.  He will require intensive rehabilitation return to his prior level of function          Author: Alberteen Sam, MD 10/11/2022 1:28 PM  For on call review [email or web address].

## 2022-10-11 NOTE — Plan of Care (Signed)
  Problem: Education: Goal: Knowledge of disease or condition will improve 10/11/2022 0107 by Clint Lipps, RN Outcome: Progressing 10/11/2022 0107 by Clint Lipps, RN Outcome: Progressing Goal: Knowledge of secondary prevention will improve (MUST DOCUMENT ALL) 10/11/2022 0107 by Clint Lipps, RN Outcome: Progressing 10/11/2022 0107 by Clint Lipps, RN Outcome: Progressing Goal: Knowledge of patient specific risk factors will improve Loraine Leriche N/A or DELETE if not current risk factor) 10/11/2022 0107 by Clint Lipps, RN Outcome: Progressing 10/11/2022 0107 by Clint Lipps, RN Outcome: Progressing   Problem: Ischemic Stroke/TIA Tissue Perfusion: Goal: Complications of ischemic stroke/TIA will be minimized 10/11/2022 0107 by Clint Lipps, RN Outcome: Progressing 10/11/2022 0107 by Clint Lipps, RN Outcome: Progressing   Problem: Coping: Goal: Will verbalize positive feelings about self 10/11/2022 0107 by Clint Lipps, RN Outcome: Progressing 10/11/2022 0107 by Clint Lipps, RN Outcome: Progressing Goal: Will identify appropriate support needs Outcome: Progressing   Problem: Health Behavior/Discharge Planning: Goal: Ability to manage health-related needs will improve Outcome: Progressing Goal: Goals will be collaboratively established with patient/family Outcome: Progressing   Problem: Self-Care: Goal: Ability to participate in self-care as condition permits will improve Outcome: Progressing Goal: Verbalization of feelings and concerns over difficulty with self-care will improve Outcome: Progressing Goal: Ability to communicate needs accurately will improve Outcome: Progressing   Problem: Nutrition: Goal: Risk of aspiration will decrease Outcome: Progressing Goal: Dietary intake will improve Outcome: Progressing   Problem: Education: Goal: Knowledge of General Education information will improve Description: Including pain rating scale,  medication(s)/side effects and non-pharmacologic comfort measures Outcome: Progressing   Problem: Health Behavior/Discharge Planning: Goal: Ability to manage health-related needs will improve Outcome: Progressing   Problem: Clinical Measurements: Goal: Ability to maintain clinical measurements within normal limits will improve Outcome: Progressing Goal: Will remain free from infection Outcome: Progressing Goal: Diagnostic test results will improve Outcome: Progressing Goal: Respiratory complications will improve Outcome: Progressing Goal: Cardiovascular complication will be avoided Outcome: Progressing   Problem: Activity: Goal: Risk for activity intolerance will decrease Outcome: Progressing   Problem: Nutrition: Goal: Adequate nutrition will be maintained Outcome: Progressing   Problem: Coping: Goal: Level of anxiety will decrease Outcome: Progressing   Problem: Elimination: Goal: Will not experience complications related to bowel motility Outcome: Progressing Goal: Will not experience complications related to urinary retention Outcome: Progressing   Problem: Pain Managment: Goal: General experience of comfort will improve Outcome: Progressing   Problem: Safety: Goal: Ability to remain free from injury will improve Outcome: Progressing   Problem: Skin Integrity: Goal: Risk for impaired skin integrity will decrease Outcome: Progressing

## 2022-10-11 NOTE — Evaluation (Signed)
Physical Therapy Evaluation Patient Details Name: Eric Dickerson MRN: [id number] DOB: 12/30/1950 Today's Date: 10/11/2022  History of Present Illness  Pt is a 72 y/o M presenting with acute dizziness and blurry vision. MRI revealed L cerebellar stroke. PMH includes CAD s/p CABG 15 years ago, HLD statin intolerant, and HTN  Clinical Impression  Received pt semi-reclined in bed and agreeable to PT evaluation. Pt transferred semi-reclined<>sitting EOB with HOB elevated and close supervision. Stood with RW and min A, but demo posterior LOB requiring mod A to correct. Pt ambulated throughout room with RW and mod A (cues for RW proximity), then ambulated into bathroom. Pt able to void but unable to have BM (required cues for safety to sit rather than stand to void). Pt lives with wife who works during the day and recognizes that he would benefit from continued intensive therapy (at least 3 hours) to maximize independence and for safety prior to returning home. Acute PT to follow.       If plan is discharge home, recommend the following: A lot of help with walking and/or transfers;Help with stairs or ramp for entrance;Assist for transportation;A lot of help with bathing/dressing/bathroom   Can travel by private vehicle        Equipment Recommendations Other (comment) (TBD)  Recommendations for Other Services  Rehab consult    Functional Status Assessment Patient has had a recent decline in their functional status and demonstrates the ability to make significant improvements in function in a reasonable and predictable amount of time.     Precautions / Restrictions Precautions Precautions: Fall Restrictions Weight Bearing Restrictions: No      Mobility  Bed Mobility Overal bed mobility: Needs Assistance Bed Mobility: Rolling, Supine to Sit, Sit to Supine Rolling: Supervision   Supine to sit: Supervision Sit to supine: Supervision   General bed mobility comments: HOB elevated Patient  Response: Cooperative  Transfers Overall transfer level: Needs assistance Equipment used: Rolling walker (2 wheels) Transfers: Sit to/from Stand Sit to Stand: Mod assist           General transfer comment: stood from EOB with RW and min A, then demonstrated posterior LOB requiring mod A to correct    Ambulation/Gait Ambulation/Gait assistance: Mod assist Gait Distance (Feet): 25 Feet Assistive device: Rolling walker (2 wheels) Gait Pattern/deviations: Decreased stride length, Wide base of support, Trunk flexed Gait velocity: decreased Gait velocity interpretation: <1.31 ft/sec, indicative of household ambulator Pre-gait activities: marches while standing EOB General Gait Details: wide and general unsteady BOS with LOB in numerous directions requiring cues to remain inside RW BOS for safety  Stairs            Wheelchair Mobility     Tilt Bed Tilt Bed Patient Response: Cooperative  Modified Rankin (Stroke Patients Only) Modified Rankin (Stroke Patients Only) Pre-Morbid Rankin Score: No significant disability Modified Rankin: Moderately severe disability     Balance Overall balance assessment: Needs assistance Sitting-balance support: Feet supported, No upper extremity supported Sitting balance-Leahy Scale: Fair Sitting balance - Comments: sitting EOB, reporting mild dizziness but BP WFL per pt   Standing balance support: Bilateral upper extremity supported, Reliant on assistive device for balance, During functional activity (RW) Standing balance-Leahy Scale: Poor Standing balance comment: required min A for static standing balance and mod A for dynamic standing balance                             Pertinent Vitals/Pain  Pain Assessment Pain Assessment: No/denies pain    Home Living Family/patient expects to be discharged to:: Private residence Living Arrangements: Spouse/significant other Available Help at Discharge: Family;Available  PRN/intermittently Type of Home: House Home Access: Stairs to enter   Entrance Stairs-Number of Steps: small threshold   Home Layout: One level Home Equipment: Agricultural consultant (2 wheels) Additional Comments: has RW from hip replacement    Prior Function Prior Level of Function : Independent/Modified Independent             Mobility Comments: indepedent without AD prior to admission       Extremity/Trunk Assessment   Upper Extremity Assessment Upper Extremity Assessment: Defer to OT evaluation    Lower Extremity Assessment Lower Extremity Assessment: Generalized weakness    Cervical / Trunk Assessment Cervical / Trunk Assessment: Normal  Communication   Communication Communication: No apparent difficulties Cueing Techniques: Verbal cues  Cognition Arousal: Alert Behavior During Therapy: WFL for tasks assessed/performed Overall Cognitive Status: Within Functional Limits for tasks assessed                                          General Comments General comments (skin integrity, edema, etc.): pt reporting dizziness and "spinning" with head turns, educated pt on location of brain affected by CVA and expected symptoms casuing balance deficits    Exercises     Assessment/Plan    PT Assessment Patient needs continued PT services  PT Problem List Decreased strength;Decreased activity tolerance;Decreased balance;Decreased mobility;Decreased coordination;Decreased knowledge of use of DME       PT Treatment Interventions DME instruction;Gait training;Stair training;Functional mobility training;Therapeutic activities;Therapeutic exercise;Balance training;Neuromuscular re-education;Patient/family education;Cognitive remediation    PT Goals (Current goals can be found in the Care Plan section)  Acute Rehab PT Goals Patient Stated Goal: to get better PT Goal Formulation: With patient Time For Goal Achievement: 10/25/22 Potential to Achieve Goals:  Good    Frequency Min 1X/week     Co-evaluation               AM-PAC PT "6 Clicks" Mobility  Outcome Measure Help needed turning from your back to your side while in a flat bed without using bedrails?: A Little Help needed moving from lying on your back to sitting on the side of a flat bed without using bedrails?: A Little Help needed moving to and from a bed to a chair (including a wheelchair)?: A Lot Help needed standing up from a chair using your arms (e.g., wheelchair or bedside chair)?: A Lot Help needed to walk in hospital room?: A Lot Help needed climbing 3-5 steps with a railing? : Total 6 Click Score: 13    End of Session Equipment Utilized During Treatment: Gait belt Activity Tolerance: Patient tolerated treatment well Patient left: in bed;with call bell/phone within reach;with bed alarm set Nurse Communication: Mobility status PT Visit Diagnosis: Unsteadiness on feet (R26.81);Other abnormalities of gait and mobility (R26.89);Muscle weakness (generalized) (M62.81);Difficulty in walking, not elsewhere classified (R26.2);Hemiplegia and hemiparesis Hemiplegia - caused by: Cerebral infarction    Time: 1610-9604 PT Time Calculation (min) (ACUTE ONLY): 19 min   Charges:   PT Evaluation $PT Eval Moderate Complexity: 1 Mod   PT General Charges $$ ACUTE PT VISIT: 1 Visit         Blima Rich PT, DPT [facility] Zaunegger 10/11/2022, 10:21 AM

## 2022-10-11 NOTE — Progress Notes (Signed)
Inpatient Rehab Admissions Coordinator:  ? ?Per therapy recommendations,  patient was screened for CIR candidacy by Laura Staley, MS, CCC-SLP. At this time, Pt. Appears to be a a potential candidate for CIR. I will place   order for rehab consult per protocol for full assessment. Please contact me any with questions. ? ?Laura Staley, MS, CCC-SLP ?Rehab Admissions Coordinator  ?[phone] (celll) ?[phone] (office) ? ?

## 2022-10-11 NOTE — Progress Notes (Signed)
STROKE TEAM PROGRESS NOTE   SUBJECTIVE (INTERVAL HISTORY) His wife and daughter are at the bedside.  Overall his condition is rapidly improving. He still has mild left UE and LE dysmetria on exam but significant imbalance on walking with PT/OT. Recommended CIR.    OBJECTIVE Temp:  [97 F (36.1 C)-98.8 F (37.1 C)] 98.1 F (36.7 C) (09/04 0735) Pulse Rate:  [65-84] 65 (09/04 0735) Cardiac Rhythm: Heart block (09/04 0717) Resp:  [14-26] 20 (09/04 0735) BP: (133-150)/(79-91) 136/79 (09/04 0735) SpO2:  [96 %-100 %] 100 % (09/04 0735) Weight:  [77.2 kg-80.7 kg] 77.2 kg (09/03 2200)  Recent Labs  Lab 10/10/22 1232  GLUCAP 161*   Recent Labs  Lab 10/10/22 1140  NA 141  K 3.0*  CL 103  CO2 21*  GLUCOSE 198*  BUN 19  CREATININE 1.12  CALCIUM 9.2   No results for input(s): "AST", "ALT", "ALKPHOS", "BILITOT", "PROT", "ALBUMIN" in the last 168 hours. Recent Labs  Lab 10/10/22 1140  WBC 10.6*  NEUTROABS 7.1  HGB 12.0*  HCT 38.5*  MCV 90.4  PLT 267   No results for input(s): "CKTOTAL", "CKMB", "CKMBINDEX", "TROPONINI" in the last 168 hours. No results for input(s): "LABPROT", "INR" in the last 72 hours. No results for input(s): "COLORURINE", "LABSPEC", "PHURINE", "GLUCOSEU", "HGBUR", "BILIRUBINUR", "KETONESUR", "PROTEINUR", "UROBILINOGEN", "NITRITE", "LEUKOCYTESUR" in the last 72 hours.  Invalid input(s): "APPERANCEUR"     Component Value Date/Time   CHOL 222 (H) 10/11/2022 0739   CHOL 196 09/04/2022 0832   TRIG 59 10/11/2022 0739   HDL 58 10/11/2022 0739   HDL 59 09/04/2022 0832   CHOLHDL 3.8 10/11/2022 0739   VLDL 12 10/11/2022 0739   LDLCALC 152 (H) 10/11/2022 0739   LDLCALC 125 (H) 09/04/2022 0832   Lab Results  Component Value Date   HGBA1C 5.7 (H) 10/11/2022      Component Value Date/Time   LABOPIA NONE DETECTED 10/11/2022 0800   COCAINSCRNUR NONE DETECTED 10/11/2022 0800   LABBENZ NONE DETECTED 10/11/2022 0800   AMPHETMU NONE DETECTED 10/11/2022 0800    THCU NONE DETECTED 10/11/2022 0800   LABBARB NONE DETECTED 10/11/2022 0800    No results for input(s): "ETH" in the last 168 hours.  I have personally reviewed the radiological images below and agree with the radiology interpretations.  ECHOCARDIOGRAM COMPLETE  Result Date: 10/11/2022    ECHOCARDIOGRAM REPORT   Patient Name:   UNDRAY COLMENERO Date of Exam: 10/11/2022 Medical Rec #:  [id number]     Height:       74.0 in Accession #:    [id number]    Weight:       170.2 lb Date of Birth:  1950-04-15      BSA:          2.029 m Patient Age:    72 years      BP:           136/79 mmHg Patient Gender: M             HR:           66 bpm. Exam Location:  Inpatient Procedure: 2D Echo, Color Doppler and Cardiac Doppler Indications:    stroke  History:        Patient has no prior history of Echocardiogram examinations.                 Prior CABG, PAD; Risk Factors:Hypertension and Dyslipidemia.  Sonographer:    Delcie Roch RDCS Referring Phys: [phone] CHRISTOPHER  P DANFORD IMPRESSIONS  1. Left ventricular ejection fraction, by estimation, is 60 to 65%. The left ventricle has normal function. The left ventricle has no regional wall motion abnormalities. Left ventricular diastolic parameters are consistent with Grade I diastolic dysfunction (impaired relaxation).  2. Right ventricular systolic function is normal. The right ventricular size is normal. There is normal pulmonary artery systolic pressure.  3. The mitral valve is normal in structure. No evidence of mitral valve regurgitation.  4. The aortic valve is tricuspid. Aortic valve regurgitation is not visualized. Aortic valve sclerosis/calcification is present, without any evidence of aortic stenosis.  5. Pulmonic valve regurgitation is moderate.  6. There is mild dilatation of the ascending aorta, measuring 40 mm.  7. The inferior vena cava is normal in size with greater than 50% respiratory variability, suggesting right atrial pressure of 3 mmHg. FINDINGS  Left  Ventricle: Left ventricular ejection fraction, by estimation, is 60 to 65%. The left ventricle has normal function. The left ventricle has no regional wall motion abnormalities. The left ventricular internal cavity size was normal in size. There is  no left ventricular hypertrophy. Left ventricular diastolic parameters are consistent with Grade I diastolic dysfunction (impaired relaxation). Right Ventricle: The right ventricular size is normal. Right ventricular systolic function is normal. There is normal pulmonary artery systolic pressure. The tricuspid regurgitant velocity is 2.11 m/s, and with an assumed right atrial pressure of 3 mmHg,  the estimated right ventricular systolic pressure is 20.8 mmHg. Left Atrium: Left atrial size was normal in size. Right Atrium: Right atrial size was normal in size. Pericardium: There is no evidence of pericardial effusion. Mitral Valve: The mitral valve is normal in structure. No evidence of mitral valve regurgitation. Tricuspid Valve: Tricuspid valve regurgitation is mild. Aortic Valve: The aortic valve is tricuspid. Aortic valve regurgitation is not visualized. Aortic valve sclerosis/calcification is present, without any evidence of aortic stenosis. Pulmonic Valve: Pulmonic valve regurgitation is moderate. Aorta: There is mild dilatation of the ascending aorta, measuring 40 mm. Venous: The inferior vena cava is normal in size with greater than 50% respiratory variability, suggesting right atrial pressure of 3 mmHg. IAS/Shunts: No atrial level shunt detected by color flow Doppler.  LEFT VENTRICLE PLAX 2D LVIDd:         4.10 cm   Diastology LVIDs:         2.90 cm   LV e' medial:    9.79 cm/s LV PW:         1.00 cm   LV E/e' medial:  7.0 LV IVS:        0.90 cm   LV e' lateral:   11.50 cm/s LVOT diam:     2.20 cm   LV E/e' lateral: 6.0 LV SV:         83 LV SV Index:   41 LVOT Area:     3.80 cm  RIGHT VENTRICLE            IVC RV Basal diam:  2.80 cm    IVC diam: 1.50 cm RV S  prime:     9.90 cm/s TAPSE (M-mode): 1.7 cm LEFT ATRIUM             Index        RIGHT ATRIUM           Index LA diam:        3.60 cm 1.77 cm/m   RA Area:     14.70 cm LA Vol (A2C):  40.4 ml 19.91 ml/m  RA Volume:   36.80 ml  18.13 ml/m LA Vol (A4C):   42.4 ml 20.89 ml/m LA Biplane Vol: 43.5 ml 21.43 ml/m  AORTIC VALVE LVOT Vmax:   123.00 cm/s LVOT Vmean:  74.900 cm/s LVOT VTI:    0.218 m  AORTA Ao Root diam: 3.00 cm Ao Asc diam:  4.00 cm MITRAL VALVE               TRICUSPID VALVE MV Area (PHT): 2.87 cm    TR Peak grad:   17.8 mmHg MV Decel Time: 264 msec    TR Vmax:        211.00 cm/s MV E velocity: 68.70 cm/s MV A velocity: 74.90 cm/s  SHUNTS MV E/A ratio:  0.92        Systemic VTI:  0.22 m                            Systemic Diam: 2.20 cm Carolan Clines Electronically signed by Carolan Clines Signature Date/Time: 10/11/2022/1:11:38 PM    Final    CT Angio Head Neck W WO CM  Result Date: 10/10/2022 CLINICAL DATA:  Vertigo, central EXAM: CT ANGIOGRAPHY HEAD AND NECK WITH AND WITHOUT CONTRAST TECHNIQUE: Multidetector CT imaging of the head and neck was performed using the standard protocol during bolus administration of intravenous contrast. Multiplanar CT image reconstructions and MIPs were obtained to evaluate the vascular anatomy. Carotid stenosis measurements (when applicable) are obtained utilizing NASCET criteria, using the distal internal carotid diameter as the denominator. RADIATION DOSE REDUCTION: This exam was performed according to the departmental dose-optimization program which includes automated exposure control, adjustment of the mA and/or kV according to patient size and/or use of iterative reconstruction technique. CONTRAST:  75mL OMNIPAQUE IOHEXOL 350 MG/ML SOLN COMPARISON:  None Available. FINDINGS: CT HEAD FINDINGS Brain: Left cerebellar infarcts better characterized on same day MRI. No evidence of acute hemorrhage, midline shift or hydrocephalus. Vascular: See below. Skull: No acute  fracture. Sinuses/Orbits: Clear sinuses.  No acute orbital findings. Other: No mastoid effusions. Review of the MIP images confirms the above findings CTA NECK FINDINGS Aortic arch: Great vessel origins are patent. Right carotid system: No evidence of dissection, stenosis (50% or greater), or occlusion. Left carotid system: No evidence of dissection, stenosis (50% or greater), or occlusion. Vertebral arteries: Codominant. No evidence of dissection, stenosis (50% or greater), or occlusion in the neck. Skeleton: Negative. Other neck: No evidence of acute abnormality on limited assessment. Upper chest: Visualized lung apices are clear. Review of the MIP images confirms the above findings CTA HEAD FINDINGS Anterior circulation: Hypoplastic right A1 ACA, likely congenital. Bilateral intracranial ICAs, MCAs and ACAs are patent. Severe right and moderate left intracranial ICA stenosis. Moderate right M1 MCA stenosis. Posterior circulation: Linear filling defect in the proximal left intradural vertebral artery (for example see series 11, image 151; series 13, image 111). Intradural vertebral arteries are patent. The basilar artery and both posterior cerebral arteries are patent without proximal high-grade stenosis. Mild irregularity of the basilar artery, most likely atherosclerotic. Moderate right P2 PCA stenosis. Venous sinuses: As permitted by contrast timing, patent. Review of the MIP images confirms the above findings IMPRESSION: 1. Linear filling defect in the proximal left intradural vertebral artery is suspicious for dissection. 2. No large vessel occlusion. 3. Severe right and moderate left intracranial ICA stenosis. 4. Severe right vertebral artery origin stenosis. 5. Moderate right M1 MCA stenosis. 6.  Moderate right P2 PCA stenosis. Findings discussed with Dr. Amada Jupiter via telephone at 7:30 p.m. Electronically Signed   By: Feliberto Harts M.D.   On: 10/10/2022 19:37   MR Brain Wo Contrast (neuro  protocol)  Result Date: 10/10/2022 CLINICAL DATA:  Neuro deficit, acute, stroke suspected EXAM: MRI HEAD WITHOUT CONTRAST TECHNIQUE: Multiplanar, multiecho pulse sequences of the brain and surrounding structures were obtained without intravenous contrast. COMPARISON:  None Available. FINDINGS: Brain: There are acute infarcts in the left cerebellum (series 3, image 9-13). No hemorrhage. No hydrocephalus. No extra-axial fluid collection. Sequela of mild chronic microvascular ischemic change. No mass effect. No mass lesion. Chronic infarcts in the left corona radiata. Vascular: Normal flow voids. Skull and upper cervical spine: Severe spinal canal stenosis C4-C5 Sinuses/Orbits: No middle ear or mastoid effusion. Paranasal sinuses are clear. Orbits are unremarkable. Other: None. IMPRESSION: 1. Acute infarcts in the left cerebellum. No hemorrhage. 2. Severe spinal canal stenosis at C4-C5. Consider further evaluation with a cervical spine MRI. Electronically Signed   By: Lorenza Cambridge M.D.   On: 10/10/2022 14:38     PHYSICAL EXAM  Temp:  [97 F (36.1 C)-98.8 F (37.1 C)] 98.1 F (36.7 C) (09/04 0735) Pulse Rate:  [65-84] 65 (09/04 0735) Resp:  [14-26] 20 (09/04 0735) BP: (133-150)/(79-91) 136/79 (09/04 0735) SpO2:  [96 %-100 %] 100 % (09/04 0735) Weight:  [77.2 kg-80.7 kg] 77.2 kg (09/03 2200)  General - Well nourished, well developed, in no apparent distress.  Ophthalmologic - fundi not visualized due to noncooperation.  Cardiovascular - Regular rhythm and rate.  Mental Status -  Level of arousal and orientation to time, place, and person were intact. Language including expression, naming, repetition, comprehension was assessed and found intact. Fund of Knowledge was assessed and was intact.  Cranial Nerves II - XII - II - Visual field intact OU. III, IV, VI - Extraocular movements intact. V - Facial sensation intact bilaterally. VII - Facial movement intact bilaterally. VIII - Hearing &  vestibular intact bilaterally. No nystagmus X - Palate elevates symmetrically. XI - Chin turning & shoulder shrug intact bilaterally. XII - Tongue protrusion intact.  Motor Strength - The patient's strength was normal in all extremities and pronator drift was absent.  Bulk was normal and fasciculations were absent.   Motor Tone - Muscle tone was assessed at the neck and appendages and was normal.  Reflexes - The patient's reflexes were symmetrical in all extremities and he had no pathological reflexes.  Sensory - Light touch, temperature/pinprick were assessed and were symmetrical.    Coordination - The patient had slight dysmetria on the left FTN and HTS, right FTN and HTS intact.  Tremor was absent.  Gait and Station - deferred to PT.   ASSESSMENT/PLAN Mr. DOMINGO MENDIZABAL is a 72 y.o. male with history of CAD, HLD, PAD admitted for dizziness, vertigo. No tPA given due to outside window.    Stroke:  left cerebellar infarct, etiology likely secondary to left V4 atherosclerotic stenosis versus dissection MRI left cerebellum PICA territory infarct CT head neck left V4 stenotic plaque versus dissection, right P2, right M1, bilateral siphon stenosis, right VA origin severe stenosis. 2D Echo EF 60 to 65% LDL 152 HgbA1c 5.7 UDS negative Lovenox for VTE prophylaxis No antithrombotic prior to admission, now on aspirin 81 mg daily and clopidogrel 75 mg daily for 3 months given left VA stenosis and then ASA alone Patient counseled to be compliant with his antithrombotic medications Ongoing aggressive stroke  risk factor management Therapy recommendations: CIR Disposition: Pending  BP management BP stable Avoid low BP Long term BP goal normotensive  Hyperlipidemia Home meds:  supposed to be on zetia 10 3 times a week, but pt not compliant LDL 152, goal < 70 Intolerance with crestor, lipitor and pravastatin Now on zetia 3 times a week Will need to consider Leqvio  Other Stroke Risk  Factors Advanced age PAD  Other Active Problems Mild leukocytosis, WBC 10.6 in  Hospital day # 0   Marvel Plan, MD PhD Stroke Neurology 10/11/2022 3:15 PM    To contact Stroke Continuity provider, please refer to [email or web address]. After hours, contact General Neurology

## 2022-10-11 NOTE — Progress Notes (Signed)
Patient back to the unit.

## 2022-10-11 NOTE — Progress Notes (Signed)
  Echocardiogram 2D Echocardiogram has been performed.  Eric Dickerson 10/11/2022, 1:01 PM

## 2022-10-11 NOTE — PMR Pre-admission (Signed)
PMR Admission Coordinator Pre-Admission Assessment  Patient: Eric Dickerson is an 72 y.o., male MRN: [id number] DOB: [birth year] Height: 6\' 2"  (188 cm) Weight: 77.2 kg              Insurance Information HMO:     PPO: yes     PCP:      IPA:      80/20:      OTHER:  PRIMARY: [employer]      Policy#: [id number]      Subscriber: pt CM Name: Hoyle Barr      Phone#: [phone] ext [phone]     Fax#: [phone] Pre-Cert#: [id number] approved for 7 days 9/5 until 9/12  updates to [place] phone [phone] ext [phone] and fax [phone]    Employer:  Benefits:  Phone #: [phone]     Name: 9/4 Eff. Date: 02/06/22     Deduct: none      Out of Pocket Max: $4000      Life Max: none  CIR: $160 co pay per day days 1 until 10      SNF: no c co pay per day days 1 until 20; $50 co pay per day days 21 until 100 Outpatient: $20 per visit     Co-Pay: per medical neccesity Home Health: 100%      Co-Pay:  DME: 80%     Co-Pay: 20% Providers: in network  SECONDARY: none  Financial Counselor:       Phone#:   The Data processing manager" for patients in Inpatient Rehabilitation Facilities with attached "Privacy Act Statement-Health Care Records" was provided and verbally reviewed with: Family  Emergency Contact Information Contact Information     Name Relation Home Work Mobile   Sansoucie,Irene Spouse [phone]  [phone]   Hough,Angela Daughter [phone]        Other Contacts   None on File    Current Medical History  Patient Admitting Diagnosis: CVA  History of Present Illness: Eric Dickerson is a 72 y.o. male with PMH of CAD s/p CABG,  Hx of R hip arthroplasty, GERD, hypertension, hyperlipidemia, PAD, lumbar spinal stenosis who presented to the hospital  on 10/10/22 for dizziness that started abruptly when he was at work.  Patient reported he was diagnosed with vertigo about a month ago but symptoms worsened significantly at that time.   He had an MRI of his brain  on 10/10/2022 which showed acute infarcts in the left cerebellum and severe spinal canal stenosis at C4-5.  Patient was seen by neurology and is continued on Plavix.  Patient reports hearing is overall decreased in his left ear.  He was seen by speech therapy and was found to have difficulty with attention, memory and executive functioning.  He is on a heart healthy diet with thin liquids.     Complete NIHSS TOTAL: 0 Glasgow Coma Scale Score: 15  Patient's medical record from [facility] has been reviewed by the rehabilitation admission coordinator and physician.  Past Medical History  Past Medical History:  Diagnosis Date   ANEMIA, MILD    CAD (coronary artery disease)    a. CAD,  b. s/p Cypher DES to the ramus and Promus DES x 2 to the RCA 08/2007 (minimal disease in LAD and CFX at that time), c. Myoview 8/10: EF 63%, inf thinning, small prior ant infarct, no ischemia;  d. s/p CABG 3/14 (L-LAD, RIMA-OM1)   DEGENERATIVE JOINT DISEASE, RIGHT HIP    DIVERTICULOSIS, COLON  ERECTILE DYSFUNCTION    GERD    Heart murmur    HIATAL HERNIA    HTN (hypertension)    Hyperlipidemia    statin intolerant   HYPERPLASIA PROSTATE UNS W/O UR OBST & OTH LUTS    LUMBAR RADICULOPATHY, RIGHT    Other abnormal glucose    PAD (peripheral artery disease) (HCC)    pre CABG 04/2012 => ABIs: Right 0.92, left 0.61 // ABI 09/02/14: R 0.93; L 0.82   SPINAL STENOSIS, LUMBAR    Has the patient had major surgery during 100 days prior to admission? No  Family History  family history includes Diabetes in his maternal grandmother; Heart attack (age of onset: 23) in his father; Heart attack (age of onset: 70) in his brother; Hyperlipidemia in his brother and mother; Hypertension in his father; Pulmonary embolism in his brother; Stroke in his brother.  Current Medications   Current Facility-Administered Medications:    acetaminophen (TYLENOL) tablet 650 mg, 650 mg, Oral, Q4H PRN **OR** acetaminophen (TYLENOL)  160 MG/5ML solution 650 mg, 650 mg, Per Tube, Q4H PRN **OR** acetaminophen (TYLENOL) suppository 650 mg, 650 mg, Rectal, Q4H PRN, Danford, Earl Lites, MD   aspirin EC tablet 81 mg, 81 mg, Oral, Daily, Marvel Plan, MD, 81 mg at 10/12/22 0948   clopidogrel (PLAVIX) tablet 75 mg, 75 mg, Oral, Daily, Danford, Earl Lites, MD, 75 mg at 10/12/22 0948   enoxaparin (LOVENOX) injection 40 mg, 40 mg, Subcutaneous, Q24H, Danford, Earl Lites, MD, 40 mg at 10/11/22 1855   ezetimibe (ZETIA) tablet 10 mg, 10 mg, Oral, Once per day on Monday Wednesday Friday, Danford, Earl Lites, MD, 10 mg at 10/11/22 0946   feeding supplement (ENSURE ENLIVE / ENSURE PLUS) liquid 237 mL, 237 mL, Oral, BID BM, Danford, Christopher P, MD, 237 mL at 10/12/22 0948   ondansetron (ZOFRAN) injection 4 mg, 4 mg, Intravenous, Q6H PRN, Danford, Earl Lites, MD, 4 mg at 10/10/22 2007   Oral care mouth rinse, 15 mL, Mouth Rinse, PRN, Danford, Earl Lites, MD   polyethylene glycol (MIRALAX / GLYCOLAX) packet 17 g, 17 g, Oral, Daily, Danford, Earl Lites, MD, 17 g at 10/12/22 0948   tamsulosin (FLOMAX) capsule 0.4 mg, 0.4 mg, Oral, QHS, Danford, Earl Lites, MD, 0.4 mg at 10/11/22 2103  Patients Current Diet:  Diet Order             Diet Heart Fluid consistency: Thin  Diet effective now                  Precautions / Restrictions Precautions Precautions: Fall Restrictions Weight Bearing Restrictions: No   Has the patient had 2 or more falls or a fall with injury in the past year?No  Prior Activity Level Community (5-7x/wk): independent and working part time 5 hrs per day, driving  Prior Functional Level Prior Function Prior Level of Function : Independent/Modified Independent Mobility Comments: indepedent without AD prior to admission ADLs Comments: working part time at [employer] location, enjoys fishing and taking his boat out  Self Care: Did the patient need help bathing, dressing, using the  toilet or eating?  Independent  Indoor Mobility: Did the patient need assistance with walking from room to room (with or without device)? Independent  Stairs: Did the patient need assistance with internal or external stairs (with or without device)? Independent  Functional Cognition: Did the patient need help planning regular tasks such as shopping or remembering to take medications? Independent  Patient Information Are you of Hispanic,  Latino/a,or Spanish origin?: A. No, not of Hispanic, Latino/a, or Spanish origin What is your race?: B. Black or African American Do you need or want an interpreter to communicate with a doctor or health care staff?: 0. No  Patient's Response To:  Health Literacy and Transportation Is the patient able to respond to health literacy and transportation needs?: Yes Health Literacy - How often do you need to have someone help you when you read instructions, pamphlets, or other written material from your doctor or pharmacy?: Never In the past 12 months, has lack of transportation kept you from medical appointments or from getting medications?: No In the past 12 months, has lack of transportation kept you from meetings, work, or from getting things needed for daily living?: No  Home Assistive Devices / Equipment Home Assistive Devices/Equipment: None Home Equipment: Agricultural consultant (2 wheels), Shower seat - built in (need to confirm built in shower seat)  Prior Device Use: Indicate devices/aids used by the patient prior to current illness, exacerbation or injury? None of the above  Current Functional Level Cognition  Overall Cognitive Status: Impaired/Different from baseline Current Attention Level: Selective Orientation Level: Oriented X4 Safety/Judgement: Decreased awareness of safety, Decreased awareness of deficits General Comments: cues for safety and DME use, showing insight into balance deficits but not the severity of it. very pleasant Attention:  Sustained Sustained Attention: Impaired Sustained Attention Impairment: Verbal basic Memory: Impaired Memory Impairment: Storage deficit, Retrieval deficit, Decreased recall of new information, Decreased short term memory (working memory) Decreased Short Term Memory: Verbal basic Awareness: Appears intact Problem Solving: Impaired Problem Solving Impairment: Functional complex Executive Function: Self Monitoring, Self Correcting Reasoning: Impaired Self Monitoring: Impaired Self Correcting: Impaired    Extremity Assessment (includes Sensation/Coordination)  Upper Extremity Assessment: Right hand dominant, RUE deficits/detail RUE Deficits / Details: R shoulder mildly weaker than L UE at 3+/5. some impaired coordination with increased time to manage smaller movements RUE Coordination: decreased fine motor  Lower Extremity Assessment: Defer to PT evaluation    ADLs  Overall ADL's : Needs assistance/impaired Eating/Feeding: Independent Grooming: Minimal assistance, Standing Upper Body Bathing: Set up, Sitting Lower Body Bathing: Moderate assistance, Sitting/lateral leans, Sit to/from stand Upper Body Dressing : Set up, Sitting Lower Body Dressing: Moderate assistance, Sitting/lateral leans, Sit to/from stand Toilet Transfer: Minimal assistance, Moderate assistance, Ambulation, Rolling walker (2 wheels) Toilet Transfer Details (indicate cue type and reason): frequent LOB walking to bathroom with RW, improving on bathroom exit though at least Min A still needed for balance correction. Min A to stand from lower toilet with use of grab bars Toileting- Clothing Manipulation and Hygiene: Minimal assistance, Sitting/lateral lean, Sit to/from stand Toileting - Clothing Manipulation Details (indicate cue type and reason): clothing mgmt assist Functional mobility during ADLs: Minimal assistance, Moderate assistance, Rolling walker (2 wheels), Cueing for sequencing, Cueing for safety     Mobility  Overal bed mobility: Needs Assistance Bed Mobility: Supine to Sit, Sit to Supine Rolling: Supervision Supine to sit: Supervision Sit to supine: Supervision General bed mobility comments: HOB elevated    Transfers  Overall transfer level: Needs assistance Equipment used: Rolling walker (2 wheels) Transfers: Sit to/from Stand Sit to Stand: Min assist General transfer comment: stood from EOB with RW and min A, then demonstrated posterior LOB requiring mod A to correct    Ambulation / Gait / Stairs / Wheelchair Mobility  Ambulation/Gait Ambulation/Gait assistance: Mod assist Gait Distance (Feet): 25 Feet Assistive device: Rolling walker (2 wheels) Gait Pattern/deviations: Decreased stride length,  Wide base of support, Trunk flexed General Gait Details: wide and general unsteady BOS with LOB in numerous directions requiring cues to remain inside RW BOS for safety Gait velocity: decreased Gait velocity interpretation: <1.31 ft/sec, indicative of household ambulator Pre-gait activities: marches while standing EOB    Posture / Balance Dynamic Sitting Balance Sitting balance - Comments: sitting EOB, reporting mild dizziness but BP WFL per pt Balance Overall balance assessment: Needs assistance Sitting-balance support: Feet supported, No upper extremity supported Sitting balance-Leahy Scale: Fair Sitting balance - Comments: sitting EOB, reporting mild dizziness but BP WFL per pt Standing balance support: Bilateral upper extremity supported, Reliant on assistive device for balance, During functional activity Standing balance-Leahy Scale: Poor Standing balance comment: required min A for static standing balance and mod A for dynamic standing balance    Special needs/care consideration    Previous Home Environment  Living Arrangements: Spouse/significant other Available Help at Discharge: Family Type of Home: House Home Layout: One level Home Access: Stairs to  enter [employer] of Steps: small threshold Bathroom Shower/Tub: Health visitor: Pharmacist, community: Yes Home Care Services: No Additional Comments: has RW from hip replacement  Discharge Living Setting Plans for Discharge Living Setting: Patient's home, Lives with (comment) (wife) Type of Home at Discharge: House Discharge Home Layout: One level Discharge Home Access: Stairs to enter Entrance Stairs-Rails: None Entrance Stairs-Number of Steps:  (smalll threshold) Discharge Bathroom Shower/Tub: Walk-in shower Discharge Bathroom Toilet: Standard Discharge Bathroom Accessibility: Yes How Accessible: Accessible via walker Does the patient have any problems obtaining your medications?: No  Social/Family/Support Systems Patient Roles: Spouse, Parent (employee part time) Contact Information: wife Anticipated Caregiver: wife and daughter Anticipated Caregiver's Contact Information: see contacts Ability/Limitations of Caregiver: wife works Water engineer, but dtr works from home Caregiver Availability: 24/7 Discharge Plan Discussed with Primary Caregiver: Yes Is Caregiver In Agreement with Plan?: Yes Does Caregiver/Family have Issues with Lodging/Transportation while Pt is in Rehab?: No  Goals Patient/Family Goal for Rehab: Mod I to supervision with PT, OT and SLP Expected length of stay: ELOS 5 to 7 days Pt/Family Agrees to Admission and willing to participate: Yes Program Orientation Provided & Reviewed with Pt/Caregiver Including Roles  & Responsibilities: Yes  Decrease burden of Care through IP rehab admission: n/a  Possible need for SNF placement upon discharge:not anticipated  Patient Condition: This patient's medical and functional status has changed since the consult dated: 10/11/22 in which the Rehabilitation Physician determined and documented that the patient's condition is appropriate for intensive rehabilitative care in an inpatient  rehabilitation facility. See "History of Present Illness" (above) for medical update. Functional changes are: min to mod assist. Patient's medical and functional status update has been discussed with the Rehabilitation physician and patient remains appropriate for inpatient rehabilitation. Will admit to inpatient rehab today.  Preadmission Screen Completed By:  Clois Dupes, RN MSN 10/12/2022 1:07 PM ______________________________________________________________________   Discussed status with Dr. Natale Lay on 10/12/22 at 1308 and received approval for admission today.  Admission Coordinator:  Clois Dupes RN MSN time 1610 Date 10/12/22

## 2022-10-11 NOTE — Consult Note (Signed)
Physical Medicine and Rehabilitation Consult Reason for Consult:Rehab Referring Physician: Dr. Maryfrances Bunnell   HPI: Eric Dickerson is a 72 y.o. male with PMH of CAD s/p CABG,  Hx of R hip arthroplasty, GERD, hypertension, hyperlipidemia, PAD, lumbar spinal stenosis who presented to the hospital for dizziness that started abruptly when he was at work.  Patient reported he was diagnosed with vertigo about a month ago but symptoms worsened significantly at that time.  He had an MRI of his brain on 10/10/2022 which showed acute infarcts in the left cerebellum and severe spinal canal stenosis at C4-5.  Patient was seen by neurology and is continued on Plavix.  Patient reports hearing is overall decreased in his left ear.  He was seen by speech therapy and was found to have difficulty with attention, memory and executive functioning.  He is on a heart healthy diet with thin liquids.  He was also seen by physical therapy requiring mod assist for sit to stand and ambulation.   Patient lives in a [place] home with just threshold to enter.  He lives with his wife.  Works but could take time off if needed.  He also has daughters that can help if needed.  Review of Systems  Constitutional:  Negative for chills and fever.  HENT:  Positive for hearing loss.   Eyes:  Positive for blurred vision. Negative for double vision.  Respiratory:  Negative for shortness of breath.   Cardiovascular:  Negative for chest pain.  Gastrointestinal:  Negative for abdominal pain, nausea and vomiting.  Genitourinary:  Negative for dysuria.  Skin:  Negative for rash.  Neurological:  Positive for dizziness. Negative for sensory change, speech change, focal weakness and headaches.   Past Medical History:  Diagnosis Date   ANEMIA, MILD    CAD (coronary artery disease)    a. CAD,  b. s/p Cypher DES to the ramus and Promus DES x 2 to the RCA 08/2007 (minimal disease in LAD and CFX at that time), c. Myoview 8/10: EF 63%, inf  thinning, small prior ant infarct, no ischemia;  d. s/p CABG 3/14 (L-LAD, RIMA-OM1)   DEGENERATIVE JOINT DISEASE, RIGHT HIP    DIVERTICULOSIS, COLON    ERECTILE DYSFUNCTION    GERD    Heart murmur    HIATAL HERNIA    HTN (hypertension)    Hyperlipidemia    statin intolerant   HYPERPLASIA PROSTATE UNS W/O UR OBST & OTH LUTS    LUMBAR RADICULOPATHY, RIGHT    Other abnormal glucose    PAD (peripheral artery disease) (HCC)    pre CABG 04/2012 => ABIs: Right 0.92, left 0.61 // ABI 09/02/14: R 0.93; L 0.82   SPINAL STENOSIS, LUMBAR    Past Surgical History:  Procedure Laterality Date   COLONOSCOPY  02/07/2000   negative   COLONOSCOPY  08/2017   TA, Dr Myrtie Neither   CORONARY ARTERY BYPASS GRAFT N/A 04/22/2012   Procedure: CORONARY ARTERY BYPASS GRAFTING (CABG);  Surgeon: Loreli Slot, MD;  [facility] OR;  Service: Open Heart Surgery;  Laterality: N/A;  CABG X 2, BIMA, POSSIBLE EVH   CORONARY STENT PLACEMENT  02/07/2008    X 2   TEE WITHOUT CARDIOVERSION N/A 04/22/2012   Procedure: TRANSESOPHAGEAL ECHOCARDIOGRAM (TEE);  Surgeon: Loreli Slot, MD;  Location: [facility] OR;  Service: Open Heart Surgery;  Laterality: N/A;   TOTAL HIP ARTHROPLASTY Right 02/07/2007   UPPER GASTROINTESTINAL ENDOSCOPY     Family History  Problem  Relation Age of Onset   Hyperlipidemia Mother    Hypertension Father    Heart attack Father 24       smoker   Stroke Brother        2 brothers ; 26 & 28   Hyperlipidemia Brother    Pulmonary embolism Brother    Heart attack Brother 36   Diabetes Maternal Grandmother    Cancer Neg Hx    COPD Neg Hx    Colon cancer Neg Hx    Esophageal cancer Neg Hx    Stomach cancer Neg Hx    Rectal cancer Neg Hx    Colon polyps Neg Hx    Social History:  reports that he quit smoking about 37 years ago. His smoking use included cigarettes. He started smoking about 49 years ago. He has a 18 pack-year smoking history. He has never used smokeless tobacco. He reports that he  does not drink alcohol and does not use drugs. Allergies:  Allergies  Allergen Reactions   Accupril [Quinapril Hcl] Cough   Ace Inhibitors Other (See Comments)    ACE inhibitors are contraindicated because of a history of rash with Angiotensin receptor blocker.   Calan [Verapamil] Other (See Comments)    Sun sensitivity   Crestor [Rosuvastatin] Other (See Comments)    Myalgias Cramps   Glucosamine Other (See Comments)    Unknown reaction   Lipitor [Atorvastatin] Other (See Comments)    Myalgias  Cramps   Livalo [Pitavastatin] Other (See Comments)    Cramps   Pravachol [Pravastatin] Other (See Comments)    Myalgias Cramps   Statins Other (See Comments)    Myalgias, cramping with: Crestor, Lipitor, Livalo, Pravachol.   Zetia [Ezetimibe] Other (See Comments)    Myalgias Cramps  Pt ok on 3x weekly dose   Benicar [Olmesartan] Rash   Medications Prior to Admission  Medication Sig Dispense Refill   acetaminophen (TYLENOL) 500 MG tablet Take 500 mg by mouth daily as needed for moderate pain, fever or headache.     amLODipine (NORVASC) 10 MG tablet TAKE 1 TABLET BY MOUTH DAILY 90 tablet 3   ezetimibe (ZETIA) 10 MG tablet Take 10 mg by mouth 3 (three) times a week.     meclizine (ANTIVERT) 12.5 MG tablet Take 1 tablet (12.5 mg total) by mouth 3 (three) times daily as needed for dizziness. 40 tablet 1   Multiple Vitamins-Minerals (CENTRUM SILVER 50+MEN) TABS Take 1 tablet by mouth daily.     tamsulosin (FLOMAX) 0.4 MG CAPS capsule Take 0.4 mg by mouth at bedtime.     nitroGLYCERIN (NITROSTAT) 0.4 MG SL tablet Take 1 tab under your tongue for chest pain  If no relief of pain may repeat NTG, one tab every 5 minutes up to 3 tablets total over 15 minutes. (Patient not taking: Reported on 10/10/2022) 25 tablet 3    Home: Home Living Family/patient expects to be discharged to:: Private residence Living Arrangements: Spouse/significant other Available Help at Discharge: Family, Available  PRN/intermittently Type of Home: House Home Access: Stairs to enter [employer] of Steps: small threshold Home Layout: One level Bathroom Shower/Tub: Health visitor: Standard Bathroom Accessibility: Yes Home Equipment: Agricultural consultant (2 wheels) Additional Comments: has RW from hip replacement  Functional History: Prior Function Prior Level of Function : Independent/Modified Independent Mobility Comments: indepedent without AD prior to admission Functional Status:  Mobility: Bed Mobility Overal bed mobility: Needs Assistance Bed Mobility: Rolling, Supine to Sit, Sit to Supine Rolling: Supervision  Supine to sit: Supervision Sit to supine: Supervision General bed mobility comments: HOB elevated Transfers Overall transfer level: Needs assistance Equipment used: Rolling walker (2 wheels) Transfers: Sit to/from Stand Sit to Stand: Mod assist General transfer comment: stood from EOB with RW and min A, then demonstrated posterior LOB requiring mod A to correct Ambulation/Gait Ambulation/Gait assistance: Mod assist Gait Distance (Feet): 25 Feet Assistive device: Rolling walker (2 wheels) Gait Pattern/deviations: Decreased stride length, Wide base of support, Trunk flexed General Gait Details: wide and general unsteady BOS with LOB in numerous directions requiring cues to remain inside RW BOS for safety Gait velocity: decreased Gait velocity interpretation: <1.31 ft/sec, indicative of household ambulator Pre-gait activities: marches while standing EOB    ADL:    Cognition: Cognition Overall Cognitive Status: Impaired/Different from baseline Orientation Level: Oriented X4 Attention: Sustained Sustained Attention: Impaired Sustained Attention Impairment: Verbal basic Memory: Impaired Memory Impairment: Storage deficit, Retrieval deficit, Decreased recall of new information, Decreased short term memory (working memory) Decreased Short Term Memory:  Verbal basic Awareness: Appears intact Problem Solving: Impaired Problem Solving Impairment: Functional complex Executive Function: Self Monitoring, Self Correcting Reasoning: Impaired Self Monitoring: Impaired Self Correcting: Impaired Cognition Arousal: Alert Behavior During Therapy: WFL for tasks assessed/performed Overall Cognitive Status: Impaired/Different from baseline  Blood pressure 136/79, pulse 65, temperature 98.1 F (36.7 C), temperature source Oral, resp. rate 20, height 6\' 2"  (1.88 m), weight 77.2 kg, SpO2 100%. Physical Exam   General: No apparent distress HEENT: Head is normocephalic, atraumatic, PERRLA, EOMI, sclera anicteric, oral mucosa pink and moist Neck: Supple without JVD or lymphadenopathy Heart: Reg rate and rhythm.  Chest: CTA bilaterally without wheezes, rales, or rhonchi; no distress Abdomen: Soft, non-tender, non-distended, bowel sounds positive. Extremities: No clubbing, cyanosis, or edema. Pulses are 2+ Psych: Pt's affect is appropriate. Pt is cooperative Skin: Clean and intact without signs of breakdown Neuro: Alert and oriented x 4, follows commands, cranial nerves II through XII intact other than hearing acuity decreased in left ear.  Able to name and repeat.  Able to remember 2 out of 3 words 5 minutes later.  Dysarthria or aphasia noted. RUE: 5/5 Deltoid, 5/5 Biceps, 5/5 Triceps, 5/5 Wrist Ext, 5/5 Grip LUE: 5/5 Deltoid, 5/5 Biceps, 5/5 Triceps, 5/5 Wrist Ext, 5/5 Grip RLE: HF 5/5, KE 5/5, ADF 5/5, APF 5/5 LLE: HF 5/5, KE 5/5, ADF 5/5, APF 5/5 Sensory exam normal for light touch and pain in all 4 limbs. No limb ataxia or cerebellar signs. Musculoskeletal: No joint swelling or tenderness noted No abnormal tone, normal muscle bulk   Results for orders placed or performed during the hospital encounter of 10/10/22 (from the past 24 hour(s))  Basic metabolic panel     Status: Abnormal   Collection Time: 10/10/22 11:40 AM  Result Value Ref Range    Sodium 141 135 - 145 mmol/L   Potassium 3.0 (L) 3.5 - 5.1 mmol/L   Chloride 103 98 - 111 mmol/L   CO2 21 (L) 22 - 32 mmol/L   Glucose, Bld 198 (H) 70 - 99 mg/dL   BUN 19 8 - 23 mg/dL   Creatinine, Ser 1.61 0.61 - 1.24 mg/dL   Calcium 9.2 8.9 - 09.6 mg/dL   GFR, Estimated >04 >54 mL/min   Anion gap 17 (H) 5 - 15  CBC WITH DIFFERENTIAL     Status: Abnormal   Collection Time: 10/10/22 11:40 AM  Result Value Ref Range   WBC 10.6 (H) 4.0 - 10.5 K/uL   RBC  4.26 4.22 - 5.81 MIL/uL   Hemoglobin 12.0 (L) 13.0 - 17.0 g/dL   HCT 16.1 (L) 09.6 - 04.5 %   MCV 90.4 80.0 - 100.0 fL   MCH 28.2 26.0 - 34.0 pg   MCHC 31.2 30.0 - 36.0 g/dL   RDW 40.9 81.1 - 91.4 %   Platelets 267 150 - 400 K/uL   nRBC 0.0 0.0 - 0.2 %   Neutrophils Relative % 66 %   Neutro Abs 7.1 1.7 - 7.7 K/uL   Lymphocytes Relative 22 %   Lymphs Abs 2.3 0.7 - 4.0 K/uL   Monocytes Relative 9 %   Monocytes Absolute 0.9 0.1 - 1.0 K/uL   Eosinophils Relative 1 %   Eosinophils Absolute 0.1 0.0 - 0.5 K/uL   Basophils Relative 1 %   Basophils Absolute 0.1 0.0 - 0.1 K/uL   Immature Granulocytes 1 %   Abs Immature Granulocytes 0.08 (H) 0.00 - 0.07 K/uL  CBG monitoring, ED     Status: Abnormal   Collection Time: 10/10/22 12:32 PM  Result Value Ref Range   Glucose-Capillary 161 (H) 70 - 99 mg/dL  Lipid panel     Status: Abnormal   Collection Time: 10/11/22  7:39 AM  Result Value Ref Range   Cholesterol 222 (H) 0 - 200 mg/dL   Triglycerides 59 <782 mg/dL   HDL 58 >95 mg/dL   Total CHOL/HDL Ratio 3.8 RATIO   VLDL 12 0 - 40 mg/dL   LDL Cholesterol 621 (H) 0 - 99 mg/dL  Hemoglobin H0Q     Status: Abnormal   Collection Time: 10/11/22  7:39 AM  Result Value Ref Range   Hgb A1c MFr Bld 5.7 (H) 4.8 - 5.6 %   Mean Plasma Glucose 116.89 mg/dL   CT Angio Head Neck W WO CM  Result Date: 10/10/2022 CLINICAL DATA:  Vertigo, central EXAM: CT ANGIOGRAPHY HEAD AND NECK WITH AND WITHOUT CONTRAST TECHNIQUE: Multidetector CT imaging of the  head and neck was performed using the standard protocol during bolus administration of intravenous contrast. Multiplanar CT image reconstructions and MIPs were obtained to evaluate the vascular anatomy. Carotid stenosis measurements (when applicable) are obtained utilizing NASCET criteria, using the distal internal carotid diameter as the denominator. RADIATION DOSE REDUCTION: This exam was performed according to the departmental dose-optimization program which includes automated exposure control, adjustment of the mA and/or kV according to patient size and/or use of iterative reconstruction technique. CONTRAST:  75mL OMNIPAQUE IOHEXOL 350 MG/ML SOLN COMPARISON:  None Available. FINDINGS: CT HEAD FINDINGS Brain: Left cerebellar infarcts better characterized on same day MRI. No evidence of acute hemorrhage, midline shift or hydrocephalus. Vascular: See below. Skull: No acute fracture. Sinuses/Orbits: Clear sinuses.  No acute orbital findings. Other: No mastoid effusions. Review of the MIP images confirms the above findings CTA NECK FINDINGS Aortic arch: Great vessel origins are patent. Right carotid system: No evidence of dissection, stenosis (50% or greater), or occlusion. Left carotid system: No evidence of dissection, stenosis (50% or greater), or occlusion. Vertebral arteries: Codominant. No evidence of dissection, stenosis (50% or greater), or occlusion in the neck. Skeleton: Negative. Other neck: No evidence of acute abnormality on limited assessment. Upper chest: Visualized lung apices are clear. Review of the MIP images confirms the above findings CTA HEAD FINDINGS Anterior circulation: Hypoplastic right A1 ACA, likely congenital. Bilateral intracranial ICAs, MCAs and ACAs are patent. Severe right and moderate left intracranial ICA stenosis. Moderate right M1 MCA stenosis. Posterior circulation: Linear filling defect  in the proximal left intradural vertebral artery (for example see series 11, image 151;  series 13, image 111). Intradural vertebral arteries are patent. The basilar artery and both posterior cerebral arteries are patent without proximal high-grade stenosis. Mild irregularity of the basilar artery, most likely atherosclerotic. Moderate right P2 PCA stenosis. Venous sinuses: As permitted by contrast timing, patent. Review of the MIP images confirms the above findings IMPRESSION: 1. Linear filling defect in the proximal left intradural vertebral artery is suspicious for dissection. 2. No large vessel occlusion. 3. Severe right and moderate left intracranial ICA stenosis. 4. Severe right vertebral artery origin stenosis. 5. Moderate right M1 MCA stenosis. 6. Moderate right P2 PCA stenosis. Findings discussed with Dr. Amada Jupiter via telephone at 7:30 p.m. Electronically Signed   By: Feliberto Harts M.D.   On: 10/10/2022 19:37   MR Brain Wo Contrast (neuro protocol)  Result Date: 10/10/2022 CLINICAL DATA:  Neuro deficit, acute, stroke suspected EXAM: MRI HEAD WITHOUT CONTRAST TECHNIQUE: Multiplanar, multiecho pulse sequences of the brain and surrounding structures were obtained without intravenous contrast. COMPARISON:  None Available. FINDINGS: Brain: There are acute infarcts in the left cerebellum (series 3, image 9-13). No hemorrhage. No hydrocephalus. No extra-axial fluid collection. Sequela of mild chronic microvascular ischemic change. No mass effect. No mass lesion. Chronic infarcts in the left corona radiata. Vascular: Normal flow voids. Skull and upper cervical spine: Severe spinal canal stenosis C4-C5 Sinuses/Orbits: No middle ear or mastoid effusion. Paranasal sinuses are clear. Orbits are unremarkable. Other: None. IMPRESSION: 1. Acute infarcts in the left cerebellum. No hemorrhage. 2. Severe spinal canal stenosis at C4-C5. Consider further evaluation with a cervical spine MRI. Electronically Signed   By: Lorenza Cambridge M.D.   On: 10/10/2022 14:38    Assessment/Plan: Diagnosis: Acute  infarcts of the left cerebellum Does the need for close, 24 hr/day medical supervision in concert with the patient's rehab needs make it unreasonable for this patient to be served in a less intensive setting? Yes Co-Morbidities requiring supervision/potential complications:  -Hypokalemia, hypertension, peripheral artery disease, BPH, CAD, spinal stenosis Due to bladder management, bowel management, safety, skin/wound care, disease management, medication administration, pain management, and patient education, does the patient require 24 hr/day rehab nursing? Yes Does the patient require coordinated care of a physician, rehab nurse, therapy disciplines of PT, OT, SLP to address physical and functional deficits in the context of the above medical diagnosis(es)? Yes Addressing deficits in the following areas: balance, endurance, locomotion, strength, transferring, bowel/bladder control, bathing, dressing, feeding, grooming, toileting, cognition, speech, language, and psychosocial support Can the patient actively participate in an intensive therapy program of at least 3 hrs of therapy per day at least 5 days per week? Yes The potential for patient to make measurable gains while on inpatient rehab is excellent Anticipated functional outcomes upon discharge from inpatient rehab are modified independent  with PT, modified independent with OT, modified independent with SLP. Estimated rehab length of stay to reach the above functional goals is: 5-7 Anticipated discharge destination: Home Overall Rehab/Functional Prognosis: excellent  POST ACUTE RECOMMENDATIONS: This patient's condition is appropriate for continued rehabilitative care in the following setting: CIR Patient has agreed to participate in recommended program. Yes Note that insurance prior authorization may be required for reimbursement for recommended care.  Comment: I think he would be a good candidate for CIR pending medical stability.  I  have personally performed a face to face diagnostic evaluation of this patient. Additionally, I have examined the patient's medical record including  any pertinent labs and radiographic images. If the physician assistant has documented in this note, I have reviewed and edited or otherwise concur with the physician assistant's documentation.  Thanks,  Fanny Dance, MD 10/11/2022

## 2022-10-11 NOTE — Progress Notes (Signed)
Patient off the unit to vascular.

## 2022-10-11 NOTE — Plan of Care (Signed)

## 2022-10-11 NOTE — TOC Initial Note (Signed)
Transition of Care [facility]) - Initial/Assessment Note    Patient Details  Name: Eric Dickerson MRN: [id number] Date of Birth: [birth year]  [facility] of [facility]) CM/SW Contact:    Kermit Balo, RN Phone Number: 10/11/2022, 12:48 PM  Clinical Narrative:                 Pt out of room but CM spoke to his wife and daughter. They state that his spouse lives with the patient but she works during the daytime. Daughter works from  home and plans to be at the home with the patient while the wife works.  No DME at home.  Family will provide needed transportation.  Pt was managing his own medications.  Awaiting CIR eval.  TOC following.  Expected Discharge Plan: IP Rehab Facility Barriers to Discharge: Continued Medical Work up   Patient Goals and CMS Choice   CMS Medicare.gov Compare Post Acute Care list provided to:: Patient Choice offered to / list presented to : Patient, Spouse, Adult Children      Expected Discharge Plan and Services   Discharge Planning Services: CM Consult Post Acute Care Choice: IP Rehab Living arrangements for the past 2 months: Single Family Home                                      Prior Living Arrangements/Services Living arrangements for the past 2 months: Single Family Home Lives with:: Spouse Patient language and need for interpreter reviewed:: Yes Do you feel safe going back to the place where you live?: Yes      Need for Family Participation in Patient Care: Yes (Comment) Care giver support system in place?: Yes (comment)   Criminal Activity/Legal Involvement Pertinent to Current Situation/Hospitalization: No - Comment as needed  Activities of Daily Living Home Assistive Devices/Equipment: None ADL Screening (condition at time of admission) Patient's cognitive ability adequate to safely complete daily activities?: Yes Is the patient deaf or have difficulty hearing?: No Does the patient have difficulty seeing, even when wearing  glasses/contacts?: Yes Does the patient have difficulty concentrating, remembering, or making decisions?: No Patient able to express need for assistance with ADLs?: No Does the patient have difficulty dressing or bathing?: No Independently performs ADLs?: Yes (appropriate for developmental age) Does the patient have difficulty walking or climbing stairs?: Yes Weakness of Legs: None Weakness of Arms/Hands: None  Permission Sought/Granted                  Emotional Assessment           Psych Involvement: No (comment)  Admission diagnosis:  Acute ischemic stroke (HCC) [I63.9] Cerebellar infarct [facility]) [I63.9] Patient Active Problem List   Diagnosis Date Noted   Vertebral artery dissection (HCC) 10/11/2022   Acute ischemic stroke (HCC) 10/10/2022   Hypokalemia 10/10/2022   Low magnesium levels 04/01/2022   Vitamin D deficiency 03/27/2021   Statin intolerance 03/24/2021   Vertigo 08/31/2020   OAB (overactive bladder) 05/03/2020   Statin myopathy 05/06/2019   Low back pain 04/22/2019   Pain in joint of right hip 12/12/2017   Elevated PSA 03/01/2017   CAD (coronary artery disease)    HTN (hypertension)    Hyperlipidemia    Impacted cerumen of both ears 11/24/2015   Encounter for well adult exam with abnormal findings 03/29/2015   Allergic rhinitis 03/23/2015   Solar urticaria 07/30/2014   Primary localized  osteoarthrosis, lower leg 07/15/2013   Acute medial meniscal tear 07/15/2013   Other abnormal glucose 09/06/2012   Urgency of urination 09/06/2012   PAD (peripheral artery disease) (HCC) 06/28/2011   ANEMIA, MILD 02/02/2010   Urinary frequency 02/02/2010   SPINAL STENOSIS, LUMBAR 08/05/2008   LUMBAR RADICULOPATHY, RIGHT 07/29/2008   HIATAL HERNIA 08/19/2007   DIVERTICULOSIS, COLON 08/19/2007   GERD 05/06/2007   ERECTILE DYSFUNCTION 01/22/2007   BPH (benign prostatic hyperplasia) 01/22/2007   DEGENERATIVE JOINT DISEASE, RIGHT HIP 01/22/2007   PCP:  [name], MD Pharmacy:   [id number] [place], [place] - [address] AT [facility] & [address] [place] [address] Phone: [phone] Fax: [phone]     Social Determinants of Health (SDOH) Social History: SDOH Screenings   Food Insecurity: No Food Insecurity (10/10/2022)  Housing: Low Risk  (10/10/2022)  Transportation Needs: No Transportation Needs (10/10/2022)  Utilities: Not At Risk (10/10/2022)  Depression (PHQ2-9): Low Risk  (08/07/2022)  Tobacco Use: Medium Risk (10/10/2022)   SDOH Interventions:     Readmission Risk Interventions     No data to display

## 2022-10-11 NOTE — Plan of Care (Signed)
  Problem: Education: Goal: Knowledge of disease or condition will improve Outcome: Progressing   Problem: Coping: Goal: Will verbalize positive feelings about self Outcome: Progressing Goal: Will identify appropriate support needs Outcome: Progressing   Problem: Health Behavior/Discharge Planning: Goal: Ability to manage health-related needs will improve Outcome: Progressing   Problem: Self-Care: Goal: Ability to communicate needs accurately will improve Outcome: Progressing   Problem: Nutrition: Goal: Risk of aspiration will decrease Outcome: Progressing   Problem: Education: Goal: Knowledge of General Education information will improve Description: Including pain rating scale, medication(s)/side effects and non-pharmacologic comfort measures Outcome: Progressing   Problem: Health Behavior/Discharge Planning: Goal: Ability to manage health-related needs will improve Outcome: Progressing

## 2022-10-12 ENCOUNTER — Other Ambulatory Visit: Payer: Self-pay

## 2022-10-12 ENCOUNTER — Other Ambulatory Visit: Payer: Self-pay | Admitting: Nurse Practitioner

## 2022-10-12 ENCOUNTER — Inpatient Hospital Stay (HOSPITAL_COMMUNITY)
Admission: RE | Admit: 2022-10-12 | Discharge: 2022-10-20 | DRG: 056 | Disposition: A | Discharge status: Home / Self Care | Payer: Medicare PPO | Source: Intra-hospital | Attending: Physical Medicine & Rehabilitation | Admitting: Physical Medicine & Rehabilitation

## 2022-10-12 ENCOUNTER — Encounter (HOSPITAL_COMMUNITY): Payer: Self-pay | Admitting: Physical Medicine & Rehabilitation

## 2022-10-12 DIAGNOSIS — R278 Other lack of coordination: Secondary | ICD-10-CM | POA: Diagnosis present

## 2022-10-12 DIAGNOSIS — I10 Essential (primary) hypertension: Secondary | ICD-10-CM

## 2022-10-12 DIAGNOSIS — I69398 Other sequelae of cerebral infarction: Secondary | ICD-10-CM | POA: Diagnosis not present

## 2022-10-12 DIAGNOSIS — Z888 Allergy status to other drugs, medicaments and biological substances status: Secondary | ICD-10-CM

## 2022-10-12 DIAGNOSIS — Z951 Presence of aortocoronary bypass graft: Secondary | ICD-10-CM

## 2022-10-12 DIAGNOSIS — R61 Generalized hyperhidrosis: Secondary | ICD-10-CM | POA: Diagnosis not present

## 2022-10-12 DIAGNOSIS — I25810 Atherosclerosis of coronary artery bypass graft(s) without angina pectoris: Secondary | ICD-10-CM | POA: Diagnosis not present

## 2022-10-12 DIAGNOSIS — Z7902 Long term (current) use of antithrombotics/antiplatelets: Secondary | ICD-10-CM

## 2022-10-12 DIAGNOSIS — R350 Frequency of micturition: Secondary | ICD-10-CM | POA: Diagnosis present

## 2022-10-12 DIAGNOSIS — Z8249 Family history of ischemic heart disease and other diseases of the circulatory system: Secondary | ICD-10-CM | POA: Diagnosis not present

## 2022-10-12 DIAGNOSIS — I63542 Cerebral infarction due to unspecified occlusion or stenosis of left cerebellar artery: Secondary | ICD-10-CM | POA: Diagnosis not present

## 2022-10-12 DIAGNOSIS — R42 Dizziness and giddiness: Secondary | ICD-10-CM | POA: Diagnosis not present

## 2022-10-12 DIAGNOSIS — E785 Hyperlipidemia, unspecified: Secondary | ICD-10-CM | POA: Diagnosis present

## 2022-10-12 DIAGNOSIS — R609 Edema, unspecified: Secondary | ICD-10-CM | POA: Diagnosis not present

## 2022-10-12 DIAGNOSIS — R7989 Other specified abnormal findings of blood chemistry: Secondary | ICD-10-CM | POA: Diagnosis not present

## 2022-10-12 DIAGNOSIS — I7774 Dissection of vertebral artery: Secondary | ICD-10-CM | POA: Diagnosis not present

## 2022-10-12 DIAGNOSIS — R339 Retention of urine, unspecified: Secondary | ICD-10-CM

## 2022-10-12 DIAGNOSIS — D72829 Elevated white blood cell count, unspecified: Secondary | ICD-10-CM

## 2022-10-12 DIAGNOSIS — I2510 Atherosclerotic heart disease of native coronary artery without angina pectoris: Secondary | ICD-10-CM | POA: Diagnosis present

## 2022-10-12 DIAGNOSIS — Z83438 Family history of other disorder of lipoprotein metabolism and other lipidemia: Secondary | ICD-10-CM | POA: Diagnosis not present

## 2022-10-12 DIAGNOSIS — Z7982 Long term (current) use of aspirin: Secondary | ICD-10-CM

## 2022-10-12 DIAGNOSIS — I639 Cerebral infarction, unspecified: Secondary | ICD-10-CM | POA: Diagnosis present

## 2022-10-12 DIAGNOSIS — E876 Hypokalemia: Secondary | ICD-10-CM | POA: Diagnosis not present

## 2022-10-12 DIAGNOSIS — K5900 Constipation, unspecified: Secondary | ICD-10-CM | POA: Diagnosis present

## 2022-10-12 DIAGNOSIS — Z96641 Presence of right artificial hip joint: Secondary | ICD-10-CM | POA: Diagnosis present

## 2022-10-12 DIAGNOSIS — Z823 Family history of stroke: Secondary | ICD-10-CM

## 2022-10-12 DIAGNOSIS — Z87891 Personal history of nicotine dependence: Secondary | ICD-10-CM | POA: Diagnosis not present

## 2022-10-12 DIAGNOSIS — R3915 Urgency of urination: Secondary | ICD-10-CM | POA: Diagnosis present

## 2022-10-12 DIAGNOSIS — Z955 Presence of coronary angioplasty implant and graft: Secondary | ICD-10-CM

## 2022-10-12 DIAGNOSIS — Z833 Family history of diabetes mellitus: Secondary | ICD-10-CM

## 2022-10-12 DIAGNOSIS — Z79899 Other long term (current) drug therapy: Secondary | ICD-10-CM

## 2022-10-12 DIAGNOSIS — Z713 Dietary counseling and surveillance: Secondary | ICD-10-CM

## 2022-10-12 DIAGNOSIS — R7303 Prediabetes: Secondary | ICD-10-CM | POA: Diagnosis not present

## 2022-10-12 DIAGNOSIS — K219 Gastro-esophageal reflux disease without esophagitis: Secondary | ICD-10-CM | POA: Diagnosis present

## 2022-10-12 DIAGNOSIS — I739 Peripheral vascular disease, unspecified: Secondary | ICD-10-CM | POA: Diagnosis not present

## 2022-10-12 LAB — BASIC METABOLIC PANEL
Anion gap: 9 (ref 5–15)
BUN: 17 mg/dL (ref 8–23)
CO2: 25 mmol/L (ref 22–32)
Calcium: 9.5 mg/dL (ref 8.9–10.3)
Chloride: 103 mmol/L (ref 98–111)
Creatinine, Ser: 1.14 mg/dL (ref 0.61–1.24)
GFR, Estimated: >60 mL/min (ref >60)
Glucose, Bld: 103 mg/dL — ABNORMAL HIGH (ref 70–99)
Potassium: 3.4 mmol/L — ABNORMAL LOW (ref 3.5–5.1)
Sodium: 137 mmol/L (ref 135–145)

## 2022-10-12 MED ORDER — ENSURE ENLIVE PO LIQD
237.0000 mL | Freq: Two times a day (BID) | ORAL | Status: DC
  Administered 2022-10-13 – 2022-10-18 (×6): 237 mL via ORAL

## 2022-10-12 MED ORDER — ALUM & MAG HYDROXIDE-SIMETH 200-200-20 MG/5ML PO SUSP
30.0000 mL | ORAL | Status: DC | PRN
  Administered 2022-10-18 – 2022-10-19 (×2): 30 mL via ORAL
  Filled 2022-10-12 (×2): qty 30

## 2022-10-12 MED ORDER — ENSURE ENLIVE PO LIQD
237.0000 mL | Freq: Two times a day (BID) | ORAL | Status: DC
  Administered 2022-10-12 (×2): 237 mL via ORAL

## 2022-10-12 MED ORDER — BISACODYL 5 MG PO TBEC
5.0000 mg | DELAYED_RELEASE_TABLET | Freq: Every day | ORAL | Status: DC | PRN
  Administered 2022-10-19: 5 mg via ORAL
  Filled 2022-10-12: qty 1

## 2022-10-12 MED ORDER — ZOLPIDEM TARTRATE 5 MG PO TABS
5.0000 mg | ORAL_TABLET | Freq: Every evening | ORAL | Status: DC | PRN
  Administered 2022-10-13 – 2022-10-19 (×6): 5 mg via ORAL
  Filled 2022-10-12 (×6): qty 1

## 2022-10-12 MED ORDER — DIPHENHYDRAMINE HCL 25 MG PO CAPS: 25.0000 mg | ORAL_CAPSULE | Freq: Four times a day (QID) | ORAL | Status: DC | PRN

## 2022-10-12 MED ORDER — POLYETHYLENE GLYCOL 3350 17 G PO PACK
17.0000 g | PACK | Freq: Every day | ORAL | Status: DC
  Administered 2022-10-12: 17 g via ORAL
  Filled 2022-10-12: qty 1

## 2022-10-12 MED ORDER — ONDANSETRON HCL 4 MG PO TABS: 4.0000 mg | ORAL_TABLET | Freq: Four times a day (QID) | ORAL | Status: DC | PRN

## 2022-10-12 MED ORDER — ENSURE ENLIVE PO LIQD: 237.0000 mL | Freq: Two times a day (BID) | ORAL | Status: DC

## 2022-10-12 MED ORDER — EZETIMIBE 10 MG PO TABS
10.0000 mg | ORAL_TABLET | ORAL | Status: DC
  Administered 2022-10-13 – 2022-10-20 (×4): 10 mg via ORAL
  Filled 2022-10-12 (×4): qty 1

## 2022-10-12 MED ORDER — POLYETHYLENE GLYCOL 3350 17 G PO PACK: 17.0000 g | PACK | Freq: Every day | ORAL | Status: DC | PRN

## 2022-10-12 MED ORDER — CLOPIDOGREL BISULFATE 75 MG PO TABS: 75.0000 mg | ORAL_TABLET | Freq: Every day | ORAL | Status: DC

## 2022-10-12 MED ORDER — ACETAMINOPHEN 325 MG PO TABS: 325.0000 mg | ORAL_TABLET | ORAL | Status: DC | PRN

## 2022-10-12 MED ORDER — ENOXAPARIN SODIUM 40 MG/0.4ML IJ SOSY
40.0000 mg | PREFILLED_SYRINGE | INTRAMUSCULAR | Status: DC
  Administered 2022-10-12 – 2022-10-19 (×8): 40 mg via SUBCUTANEOUS
  Filled 2022-10-12 (×8): qty 0.4

## 2022-10-12 MED ORDER — GUAIFENESIN-DM 100-10 MG/5ML PO SYRP: 10.0000 mL | ORAL_SOLUTION | Freq: Four times a day (QID) | ORAL | Status: DC | PRN

## 2022-10-12 MED ORDER — ONDANSETRON HCL 4 MG/2ML IJ SOLN: 4.0000 mg | Freq: Four times a day (QID) | INTRAMUSCULAR | Status: DC | PRN

## 2022-10-12 MED ORDER — TAMSULOSIN HCL 0.4 MG PO CAPS
0.4000 mg | ORAL_CAPSULE | Freq: Every day | ORAL | Status: DC
  Administered 2022-10-12 – 2022-10-19 (×8): 0.4 mg via ORAL
  Filled 2022-10-12 (×8): qty 1

## 2022-10-12 MED ORDER — METHOCARBAMOL 500 MG PO TABS: 500.0000 mg | ORAL_TABLET | Freq: Four times a day (QID) | ORAL | Status: DC | PRN

## 2022-10-12 MED ORDER — ENOXAPARIN SODIUM 40 MG/0.4ML IJ SOSY: 40.0000 mg | PREFILLED_SYRINGE | INTRAMUSCULAR | Status: DC

## 2022-10-12 MED ORDER — CLOPIDOGREL BISULFATE 75 MG PO TABS
75.0000 mg | ORAL_TABLET | Freq: Every day | ORAL | Status: DC
  Administered 2022-10-13 – 2022-10-20 (×8): 75 mg via ORAL
  Filled 2022-10-12 (×8): qty 1

## 2022-10-12 MED ORDER — ASPIRIN 81 MG PO TBEC
81.0000 mg | DELAYED_RELEASE_TABLET | Freq: Every day | ORAL | Status: DC
  Administered 2022-10-13 – 2022-10-20 (×8): 81 mg via ORAL
  Filled 2022-10-12 (×8): qty 1

## 2022-10-12 MED ORDER — POTASSIUM CHLORIDE CRYS ER 20 MEQ PO TBCR
40.0000 meq | EXTENDED_RELEASE_TABLET | Freq: Once | ORAL | Status: AC
  Administered 2022-10-12: 40 meq via ORAL
  Filled 2022-10-12: qty 2

## 2022-10-12 MED ORDER — ASPIRIN 81 MG PO TBEC: 81.0000 mg | DELAYED_RELEASE_TABLET | Freq: Every day | ORAL | Status: DC

## 2022-10-12 MED ORDER — FLEET ENEMA RE ENEM: 1.0000 | ENEMA | Freq: Once | RECTAL | Status: DC | PRN

## 2022-10-12 NOTE — Progress Notes (Addendum)
STROKE TEAM PROGRESS NOTE   SUBJECTIVE (INTERVAL HISTORY) No family at the bedside.  Neurological exam is stable and unchanged.  Patient had yesterday expressed desire to participate in the [facility] patient assistance program but when he was asked to sign paperwork to facilitate this he refused and changed his mind  OBJECTIVE Temp:  [98.5 F (36.9 C)-99.6 F (37.6 C)] 98.5 F (36.9 C) (09/05 0815) Pulse Rate:  [55-72] 60 (09/05 0815) Cardiac Rhythm: Normal sinus rhythm (09/05 0759) Resp:  [12-22] 17 (09/05 0815) BP: (107-139)/(65-86) 139/86 (09/05 0815) SpO2:  [99 %-100 %] 100 % (09/05 0815)  Recent Labs  Lab 10/10/22 1232  GLUCAP 161*   Recent Labs  Lab 10/10/22 1140  NA 141  K 3.0*  CL 103  CO2 21*  GLUCOSE 198*  BUN 19  CREATININE 1.12  CALCIUM 9.2   No results for input(s): "AST", "ALT", "ALKPHOS", "BILITOT", "PROT", "ALBUMIN" in the last 168 hours. Recent Labs  Lab 10/10/22 1140  WBC 10.6*  NEUTROABS 7.1  HGB 12.0*  HCT 38.5*  MCV 90.4  PLT 267   No results for input(s): "CKTOTAL", "CKMB", "CKMBINDEX", "TROPONINI" in the last 168 hours. No results for input(s): "LABPROT", "INR" in the last 72 hours. No results for input(s): "COLORURINE", "LABSPEC", "PHURINE", "GLUCOSEU", "HGBUR", "BILIRUBINUR", "KETONESUR", "PROTEINUR", "UROBILINOGEN", "NITRITE", "LEUKOCYTESUR" in the last 72 hours.  Invalid input(s): "APPERANCEUR"     Component Value Date/Time   CHOL 222 (H) 10/11/2022 0739   CHOL 196 09/04/2022 0832   TRIG 59 10/11/2022 0739   HDL 58 10/11/2022 0739   HDL 59 09/04/2022 0832   CHOLHDL 3.8 10/11/2022 0739   VLDL 12 10/11/2022 0739   LDLCALC 152 (H) 10/11/2022 0739   LDLCALC 125 (H) 09/04/2022 0832   Lab Results  Component Value Date   HGBA1C 5.7 (H) 10/11/2022      Component Value Date/Time   LABOPIA NONE DETECTED 10/11/2022 0800   COCAINSCRNUR NONE DETECTED 10/11/2022 0800   LABBENZ NONE DETECTED 10/11/2022 0800   AMPHETMU NONE DETECTED  10/11/2022 0800   THCU NONE DETECTED 10/11/2022 0800   LABBARB NONE DETECTED 10/11/2022 0800    No results for input(s): "ETH" in the last 168 hours.  I have personally reviewed the radiological images below and agree with the radiology interpretations.  ECHOCARDIOGRAM COMPLETE  Result Date: 10/11/2022    ECHOCARDIOGRAM REPORT   Patient Name:   KELL ELPERS Date of Exam: 10/11/2022 Medical Rec #:  [id number]     Height:       74.0 in Accession #:    [id number]    Weight:       170.2 lb Date of Birth:  10-01-1950      BSA:          2.029 m Patient Age:    72 years      BP:           136/79 mmHg Patient Gender: M             HR:           66 bpm. Exam Location:  Inpatient Procedure: 2D Echo, Color Doppler and Cardiac Doppler Indications:    stroke  History:        Patient has no prior history of Echocardiogram examinations.                 Prior CABG, PAD; Risk Factors:Hypertension and Dyslipidemia.  Sonographer:    Delcie Roch RDCS Referring Phys: [id number] CHRISTOPHER P DANFORD  IMPRESSIONS  1. Left ventricular ejection fraction, by estimation, is 60 to 65%. The left ventricle has normal function. The left ventricle has no regional wall motion abnormalities. Left ventricular diastolic parameters are consistent with Grade I diastolic dysfunction (impaired relaxation).  2. Right ventricular systolic function is normal. The right ventricular size is normal. There is normal pulmonary artery systolic pressure.  3. The mitral valve is normal in structure. No evidence of mitral valve regurgitation.  4. The aortic valve is tricuspid. Aortic valve regurgitation is not visualized. Aortic valve sclerosis/calcification is present, without any evidence of aortic stenosis.  5. Pulmonic valve regurgitation is moderate.  6. There is mild dilatation of the ascending aorta, measuring 40 mm.  7. The inferior vena cava is normal in size with greater than 50% respiratory variability, suggesting right atrial pressure of 3 mmHg.  FINDINGS  Left Ventricle: Left ventricular ejection fraction, by estimation, is 60 to 65%. The left ventricle has normal function. The left ventricle has no regional wall motion abnormalities. The left ventricular internal cavity size was normal in size. There is  no left ventricular hypertrophy. Left ventricular diastolic parameters are consistent with Grade I diastolic dysfunction (impaired relaxation). Right Ventricle: The right ventricular size is normal. Right ventricular systolic function is normal. There is normal pulmonary artery systolic pressure. The tricuspid regurgitant velocity is 2.11 m/s, and with an assumed right atrial pressure of 3 mmHg,  the estimated right ventricular systolic pressure is 20.8 mmHg. Left Atrium: Left atrial size was normal in size. Right Atrium: Right atrial size was normal in size. Pericardium: There is no evidence of pericardial effusion. Mitral Valve: The mitral valve is normal in structure. No evidence of mitral valve regurgitation. Tricuspid Valve: Tricuspid valve regurgitation is mild. Aortic Valve: The aortic valve is tricuspid. Aortic valve regurgitation is not visualized. Aortic valve sclerosis/calcification is present, without any evidence of aortic stenosis. Pulmonic Valve: Pulmonic valve regurgitation is moderate. Aorta: There is mild dilatation of the ascending aorta, measuring 40 mm. Venous: The inferior vena cava is normal in size with greater than 50% respiratory variability, suggesting right atrial pressure of 3 mmHg. IAS/Shunts: No atrial level shunt detected by color flow Doppler.  LEFT VENTRICLE PLAX 2D LVIDd:         4.10 cm   Diastology LVIDs:         2.90 cm   LV e' medial:    9.79 cm/s LV PW:         1.00 cm   LV E/e' medial:  7.0 LV IVS:        0.90 cm   LV e' lateral:   11.50 cm/s LVOT diam:     2.20 cm   LV E/e' lateral: 6.0 LV SV:         83 LV SV Index:   41 LVOT Area:     3.80 cm  RIGHT VENTRICLE            IVC RV Basal diam:  2.80 cm    IVC diam:  1.50 cm RV S prime:     9.90 cm/s TAPSE (M-mode): 1.7 cm LEFT ATRIUM             Index        RIGHT ATRIUM           Index LA diam:        3.60 cm 1.77 cm/m   RA Area:     14.70 cm LA Vol (A2C):   40.4 ml  19.91 ml/m  RA Volume:   36.80 ml  18.13 ml/m LA Vol (A4C):   42.4 ml 20.89 ml/m LA Biplane Vol: 43.5 ml 21.43 ml/m  AORTIC VALVE LVOT Vmax:   123.00 cm/s LVOT Vmean:  74.900 cm/s LVOT VTI:    0.218 m  AORTA Ao Root diam: 3.00 cm Ao Asc diam:  4.00 cm MITRAL VALVE               TRICUSPID VALVE MV Area (PHT): 2.87 cm    TR Peak grad:   17.8 mmHg MV Decel Time: 264 msec    TR Vmax:        211.00 cm/s MV E velocity: 68.70 cm/s MV A velocity: 74.90 cm/s  SHUNTS MV E/A ratio:  0.92        Systemic VTI:  0.22 m                            Systemic Diam: 2.20 cm Carolan Clines Electronically signed by Carolan Clines Signature Date/Time: 10/11/2022/1:11:38 PM    Final    CT Angio Head Neck W WO CM  Result Date: 10/10/2022 CLINICAL DATA:  Vertigo, central EXAM: CT ANGIOGRAPHY HEAD AND NECK WITH AND WITHOUT CONTRAST TECHNIQUE: Multidetector CT imaging of the head and neck was performed using the standard protocol during bolus administration of intravenous contrast. Multiplanar CT image reconstructions and MIPs were obtained to evaluate the vascular anatomy. Carotid stenosis measurements (when applicable) are obtained utilizing NASCET criteria, using the distal internal carotid diameter as the denominator. RADIATION DOSE REDUCTION: This exam was performed according to the departmental dose-optimization program which includes automated exposure control, adjustment of the mA and/or kV according to patient size and/or use of iterative reconstruction technique. CONTRAST:  75mL OMNIPAQUE IOHEXOL 350 MG/ML SOLN COMPARISON:  None Available. FINDINGS: CT HEAD FINDINGS Brain: Left cerebellar infarcts better characterized on same day MRI. No evidence of acute hemorrhage, midline shift or hydrocephalus. Vascular: See below. Skull: No  acute fracture. Sinuses/Orbits: Clear sinuses.  No acute orbital findings. Other: No mastoid effusions. Review of the MIP images confirms the above findings CTA NECK FINDINGS Aortic arch: Great vessel origins are patent. Right carotid system: No evidence of dissection, stenosis (50% or greater), or occlusion. Left carotid system: No evidence of dissection, stenosis (50% or greater), or occlusion. Vertebral arteries: Codominant. No evidence of dissection, stenosis (50% or greater), or occlusion in the neck. Skeleton: Negative. Other neck: No evidence of acute abnormality on limited assessment. Upper chest: Visualized lung apices are clear. Review of the MIP images confirms the above findings CTA HEAD FINDINGS Anterior circulation: Hypoplastic right A1 ACA, likely congenital. Bilateral intracranial ICAs, MCAs and ACAs are patent. Severe right and moderate left intracranial ICA stenosis. Moderate right M1 MCA stenosis. Posterior circulation: Linear filling defect in the proximal left intradural vertebral artery (for example see series 11, image 151; series 13, image 111). Intradural vertebral arteries are patent. The basilar artery and both posterior cerebral arteries are patent without proximal high-grade stenosis. Mild irregularity of the basilar artery, most likely atherosclerotic. Moderate right P2 PCA stenosis. Venous sinuses: As permitted by contrast timing, patent. Review of the MIP images confirms the above findings IMPRESSION: 1. Linear filling defect in the proximal left intradural vertebral artery is suspicious for dissection. 2. No large vessel occlusion. 3. Severe right and moderate left intracranial ICA stenosis. 4. Severe right vertebral artery origin stenosis. 5. Moderate right M1 MCA stenosis. 6. Moderate right  P2 PCA stenosis. Findings discussed with Dr. Amada Jupiter via telephone at 7:30 p.m. Electronically Signed   By: Feliberto Harts M.D.   On: 10/10/2022 19:37   MR Brain Wo Contrast (neuro  protocol)  Result Date: 10/10/2022 CLINICAL DATA:  Neuro deficit, acute, stroke suspected EXAM: MRI HEAD WITHOUT CONTRAST TECHNIQUE: Multiplanar, multiecho pulse sequences of the brain and surrounding structures were obtained without intravenous contrast. COMPARISON:  None Available. FINDINGS: Brain: There are acute infarcts in the left cerebellum (series 3, image 9-13). No hemorrhage. No hydrocephalus. No extra-axial fluid collection. Sequela of mild chronic microvascular ischemic change. No mass effect. No mass lesion. Chronic infarcts in the left corona radiata. Vascular: Normal flow voids. Skull and upper cervical spine: Severe spinal canal stenosis C4-C5 Sinuses/Orbits: No middle ear or mastoid effusion. Paranasal sinuses are clear. Orbits are unremarkable. Other: None. IMPRESSION: 1. Acute infarcts in the left cerebellum. No hemorrhage. 2. Severe spinal canal stenosis at C4-C5. Consider further evaluation with a cervical spine MRI. Electronically Signed   By: Lorenza Cambridge M.D.   On: 10/10/2022 14:38     PHYSICAL EXAM  Temp:  [98.5 F (36.9 C)-99.6 F (37.6 C)] 98.5 F (36.9 C) (09/05 0815) Pulse Rate:  [55-72] 60 (09/05 0815) Resp:  [12-22] 17 (09/05 0815) BP: (107-139)/(65-86) 139/86 (09/05 0815) SpO2:  [99 %-100 %] 100 % (09/05 0815)  General - Well nourished, well developed, in no apparent distress. Cardiovascular - Regular rhythm and rate.  Mental Status -  Level of arousal and orientation to time, place, and person were intact. Language including expression, naming, repetition, comprehension was assessed and found intact. Fund of Knowledge was assessed and was intact.  Cranial Nerves II - XII - II - Visual field intact OU. III, IV, VI - Extraocular movements intact. V - Facial sensation intact bilaterally. VII - slight right facial VIII - Hearing & vestibular intact bilaterally. No nystagmus X - Palate elevates symmetrically. XI - Chin turning & shoulder shrug intact  bilaterally. XII - Tongue protrusion intact.  Motor Strength - The patient's strength was normal in all extremities and pronator drift was absent.  Bulk was normal and fasciculations were absent.   Motor Tone - Muscle tone was assessed at the neck and appendages and was normal.  Sensory - Light touch, temperature/pinprick were assessed and were symmetrical.    Coordination - The patient had slight dysmetria on the left FTN and HTS, right FTN and HTS intact.  Tremor was absent.  Gait and Station - deferred to PT.   ASSESSMENT/PLAN Mr. JAQUAVEON VANOS is a 72 y.o. male with history of CAD, HLD, PAD admitted for dizziness, vertigo. No tPA given due to outside window.    Stroke:  left cerebellar infarct, etiology likely secondary to left V4 atherosclerotic stenosis versus dissection MRI left cerebellum PICA territory infarct CT head neck left V4 stenotic plaque versus dissection, right P2, right M1, bilateral siphon stenosis, right VA origin severe stenosis. 2D Echo EF 60 to 65% LDL 152 HgbA1c 5.7 UDS negative Lovenox for VTE prophylaxis No antithrombotic prior to admission, now on aspirin 81 mg daily and clopidogrel 75 mg daily for 3 months given left VA stenosis and then ASA alone Will need outpatient neurology follow up in 8 weeks after discharge  Patient counseled to be compliant with his antithrombotic medications Ongoing aggressive stroke risk factor management Therapy recommendations: CIR Disposition: Pending  BP management BP stable Avoid low BP Long term BP goal normotensive  Hyperlipidemia Home meds:  supposed  to be on zetia 10 3 times a week, but pt not compliant LDL 152, goal < 70 Intolerance with crestor, lipitor and pravastatin Now on zetia 3 times a week He has refused Leqvio  Other Stroke Risk Factors Advanced age PAD  Other Active Problems Mild leukocytosis, WBC 10.6  Hypokalemia K 3.4 replace per primary team  Neurology will sign off. Please call with  questions or concerns   Hospital day # 1  Gevena Mart DNP, ACNPC-AG  [facility]  I have personally obtained history,examined this patient, reviewed notes, independently viewed imaging studies, participated in medical decision making and plan of care.ROS completed by me personally and pertinent positives fully documented  I have made any additions or clarifications directly to the above note. Agree with note above.  Patient presented with left-sided ataxia secondary to left cerebellar stroke from left vertebral artery stenosis.  Recommend aspirin and Plavix for 3 months followed by aspirin alone and continue aggressive risk factor control.  Patient was offered Leqvio for lipid control seems to have changed his mind and is not refusing.  Follow-up with an outpatient stroke clinic in 2 months.  Stroke team will sign off.  Kindly call for questions.  Greater than 50% time during this 35-minute visit was spent in counseling coordination of care and discussion with patient and care team and answering questions.  Discussed with Dr. Maryfrances Bunnell.  Delia Heady, MD Medical Director [facility] Pager: [phone] 10/12/2022 3:00 PM   To contact Stroke Continuity provider, please refer to [email or web address]. After hours, contact General Neurology

## 2022-10-12 NOTE — Plan of Care (Signed)

## 2022-10-12 NOTE — Progress Notes (Signed)
Inpatient Rehabilitation Admissions Coordinator   I have insurance approval and CIR bed to admit him to today. I will make the arrangements.  Ottie Glazier, RN, MSN Rehab Admissions Coordinator [phone] 10/12/2022 12:40 PM

## 2022-10-12 NOTE — TOC Transition Note (Signed)
Transition of Care [facility]) - CM/SW Discharge Note   Patient Details  Name: Eric Dickerson MRN: [id number] Date of Birth: February 22, 1950  Transition of Care [facility] CM/SW Contact:  Kermit Balo, RN Phone Number: 10/12/2022, 12:58 PM   Clinical Narrative:    The patient is discharging to CIR today. CM signing off.   Final next level of care: IP Rehab Facility Barriers to Discharge: No Barriers Identified   Patient Goals and CMS Choice CMS Medicare.gov Compare Post Acute Care list provided to:: Patient Choice offered to / list presented to : Patient, Spouse, Adult Children  Discharge Placement                         Discharge Plan and Services Additional resources added to the After Visit Summary for     Discharge Planning Services: CM Consult Post Acute Care Choice: IP Rehab                               Social Determinants of Health (SDOH) Interventions SDOH Screenings   Food Insecurity: No Food Insecurity (10/10/2022)  Housing: Low Risk  (10/10/2022)  Transportation Needs: No Transportation Needs (10/10/2022)  Utilities: Not At Risk (10/10/2022)  Depression (PHQ2-9): Low Risk  (08/07/2022)  Tobacco Use: Medium Risk (10/10/2022)     Readmission Risk Interventions     No data to display

## 2022-10-12 NOTE — Discharge Summary (Signed)
Physician Discharge Summary   Patient: Eric Dickerson MRN: [id number] DOB: August 08, 1950  Admit date:     10/10/2022  Discharge date: 10/12/22  Discharge Physician: Alberteen Sam   PCP: [name], MD     Recommendations at discharge:  Follow-up with [facility] in 6 to 8 weeks for stroke Start Leqvio for hyperlipidemia Follow-up with PCP for hypertension within 1 week of discharge from rehab [facility]: Please monitor blood pressure and resume home amlodipine if blood pressure consistently over 130 systolic     Discharge Diagnoses: Principal Problem:   Acute ischemic stroke [facility]) Active Problems:   Vertebral artery dissection (HCC)   BPH (benign prostatic hyperplasia)   PAD (peripheral artery disease) (HCC)   HTN (hypertension)   Hypokalemia     Hospital Course: Mr. Sebring is a 72 y.o. M with hx CAD s/p CABG 15 years ago, HLD statin intolerant, and HTN who presented with acute dizziness, blurry vision.  In the ER, MRI brain showed cerebellar stroke.        * Acute ischemic stroke due to atherosclerosis versus dissection of the left V4 segment MRI brain showed acute infarcts in the left cerebellum - Non-invasive angiography showed advanced atherosclerosis versus dissection of the left vertebral artery - Echocardiogram showed no cardiogenic source of embolism - Lipids ordered: LDL 152, previously extensively statin intolerant - Continue Zetia, outpatient referral for PCSK9 inhibitor - Aspirin and Plavix for 3 months - Resume amlodipine if needed, in 2-3 days - Atrial fibrillation: none detected - tPA not given because outside the window - Dysphagia screen ordered in ER - PT eval ordered: Recommending inpatient rehab - Nonsmoker   Possible vertebral artery dissection (HCC)  DAPT for 3 months. Neurology follow up. Lipid and BP control.  Hypokalemia Repleted  HTN (hypertension) Resume amlodipine as appropraite  PAD  (peripheral artery disease) (HCC) Referred for Leqvio.  BPH (benign prostatic hyperplasia) On Flomax            [facility] Controlled Substances Registry was reviewed for this patient prior to discharge.  Consultants: Neurology  Procedures performed:  Echocardiogram CTA head and neck MRI brain   Disposition:  Inpatient rehab Diet recommendation:  Cardiac diet  DISCHARGE MEDICATION: Allergies as of 10/12/2022       Reactions   Accupril [quinapril Hcl] Cough   Ace Inhibitors Other (See Comments)   ACE inhibitors are contraindicated because of a history of rash with Angiotensin receptor blocker.   Calan [verapamil] Other (See Comments)   Sun sensitivity   Crestor [rosuvastatin] Other (See Comments)   Myalgias Cramps   Glucosamine Other (See Comments)   Unknown reaction   Lipitor [atorvastatin] Other (See Comments)   Myalgias  Cramps   Livalo [pitavastatin] Other (See Comments)   Cramps   Pravachol [pravastatin] Other (See Comments)   Myalgias Cramps   Statins Other (See Comments)   Myalgias, cramping with: Crestor, Lipitor, Livalo, Pravachol.   Zetia [ezetimibe] Other (See Comments)   Myalgias Cramps Pt ok on 3x weekly dose   Benicar [olmesartan] Rash        Medication List     STOP taking these medications    amLODipine 10 MG tablet Commonly known as: NORVASC       TAKE these medications    acetaminophen 500 MG tablet Commonly known as: TYLENOL Take 500 mg by mouth daily as needed for moderate pain, fever or headache.   aspirin EC 81 MG tablet Take 1 tablet (  81 mg total) by mouth daily. Swallow whole. Start taking on: October 13, 2022   Centrum Silver 50+Men Tabs Take 1 tablet by mouth daily.   clopidogrel 75 MG tablet Commonly known as: PLAVIX Take 1 tablet (75 mg total) by mouth daily. Start taking on: October 13, 2022   ezetimibe 10 MG tablet Commonly known as: ZETIA Take 10 mg by mouth 3 (three) times a week.    feeding supplement Liqd Take 237 mLs by mouth 2 (two) times daily between meals.   meclizine 12.5 MG tablet Commonly known as: ANTIVERT Take 1 tablet (12.5 mg total) by mouth 3 (three) times daily as needed for dizziness.   nitroGLYCERIN 0.4 MG SL tablet Commonly known as: NITROSTAT Take 1 tab under your tongue for chest pain  If no relief of pain may repeat NTG, one tab every 5 minutes up to 3 tablets total over 15 minutes.   tamsulosin 0.4 MG Caps capsule Commonly known as: FLOMAX Take 0.4 mg by mouth at bedtime.        Follow-up Information     [facility] Follow up.   Contact information: [address]     [address] [place] [address] [phone]        [name], MD Follow up.   Specialties: Internal Medicine, Radiology Contact information: [address] [place] [address] [phone]                   Discharge Exam: Ceasar Mons Weights   10/10/22 1803 10/10/22 2200  Weight: 80.7 kg 77.2 kg    General: Pt is alert, awake, not in acute distress Cardiovascular: RRR, nl S1-S2, no murmurs appreciated.   No LE edema.   Respiratory: Normal respiratory rate and rhythm.  CTAB without rales or wheezes. Abdominal: Abdomen soft and non-tender.  No distension or HSM.   Neuro/Psych: Strength symmetric in upper and lower extremities.  Judgment and insight appear normal.  Mild ataxia, mild nystagmus.  Gait unstable.   Condition at discharge: good  The results of significant diagnostics from this hospitalization (including imaging, microbiology, ancillary and laboratory) are listed below for reference.   Imaging Studies: ECHOCARDIOGRAM COMPLETE  Result Date: 10/11/2022    ECHOCARDIOGRAM REPORT   Patient Name:   Eric Dickerson Date of Exam: 10/11/2022 Medical Rec #:  [id number]     Height:       74.0 in Accession #:    [id number]    Weight:       170.2 lb Date of Birth:  1950-08-15      BSA:          2.029 m Patient  Age:    72 years      BP:           136/79 mmHg Patient Gender: M             HR:           66 bpm. Exam Location:  Inpatient Procedure: 2D Echo, Color Doppler and Cardiac Doppler Indications:    stroke  History:        Patient has no prior history of Echocardiogram examinations.                 Prior CABG, PAD; Risk Factors:Hypertension and Dyslipidemia.  Sonographer:    Delcie Roch RDCS Referring Phys: [id number] Dantae Meunier P Mechel Haggard IMPRESSIONS  1. Left ventricular ejection fraction, by estimation, is 60 to 65%. The left ventricle has normal  function. The left ventricle has no regional wall motion abnormalities. Left ventricular diastolic parameters are consistent with Grade I diastolic dysfunction (impaired relaxation).  2. Right ventricular systolic function is normal. The right ventricular size is normal. There is normal pulmonary artery systolic pressure.  3. The mitral valve is normal in structure. No evidence of mitral valve regurgitation.  4. The aortic valve is tricuspid. Aortic valve regurgitation is not visualized. Aortic valve sclerosis/calcification is present, without any evidence of aortic stenosis.  5. Pulmonic valve regurgitation is moderate.  6. There is mild dilatation of the ascending aorta, measuring 40 mm.  7. The inferior vena cava is normal in size with greater than 50% respiratory variability, suggesting right atrial pressure of 3 mmHg. FINDINGS  Left Ventricle: Left ventricular ejection fraction, by estimation, is 60 to 65%. The left ventricle has normal function. The left ventricle has no regional wall motion abnormalities. The left ventricular internal cavity size was normal in size. There is  no left ventricular hypertrophy. Left ventricular diastolic parameters are consistent with Grade I diastolic dysfunction (impaired relaxation). Right Ventricle: The right ventricular size is normal. Right ventricular systolic function is normal. There is normal pulmonary artery systolic  pressure. The tricuspid regurgitant velocity is 2.11 m/s, and with an assumed right atrial pressure of 3 mmHg,  the estimated right ventricular systolic pressure is 20.8 mmHg. Left Atrium: Left atrial size was normal in size. Right Atrium: Right atrial size was normal in size. Pericardium: There is no evidence of pericardial effusion. Mitral Valve: The mitral valve is normal in structure. No evidence of mitral valve regurgitation. Tricuspid Valve: Tricuspid valve regurgitation is mild. Aortic Valve: The aortic valve is tricuspid. Aortic valve regurgitation is not visualized. Aortic valve sclerosis/calcification is present, without any evidence of aortic stenosis. Pulmonic Valve: Pulmonic valve regurgitation is moderate. Aorta: There is mild dilatation of the ascending aorta, measuring 40 mm. Venous: The inferior vena cava is normal in size with greater than 50% respiratory variability, suggesting right atrial pressure of 3 mmHg. IAS/Shunts: No atrial level shunt detected by color flow Doppler.  LEFT VENTRICLE PLAX 2D LVIDd:         4.10 cm   Diastology LVIDs:         2.90 cm   LV e' medial:    9.79 cm/s LV PW:         1.00 cm   LV E/e' medial:  7.0 LV IVS:        0.90 cm   LV e' lateral:   11.50 cm/s LVOT diam:     2.20 cm   LV E/e' lateral: 6.0 LV SV:         83 LV SV Index:   41 LVOT Area:     3.80 cm  RIGHT VENTRICLE            IVC RV Basal diam:  2.80 cm    IVC diam: 1.50 cm RV S prime:     9.90 cm/s TAPSE (M-mode): 1.7 cm LEFT ATRIUM             Index        RIGHT ATRIUM           Index LA diam:        3.60 cm 1.77 cm/m   RA Area:     14.70 cm LA Vol (A2C):   40.4 ml 19.91 ml/m  RA Volume:   36.80 ml  18.13 ml/m LA Vol (A4C):   42.4 ml  20.89 ml/m LA Biplane Vol: 43.5 ml 21.43 ml/m  AORTIC VALVE LVOT Vmax:   123.00 cm/s LVOT Vmean:  74.900 cm/s LVOT VTI:    0.218 m  AORTA Ao Root diam: 3.00 cm Ao Asc diam:  4.00 cm MITRAL VALVE               TRICUSPID VALVE MV Area (PHT): 2.87 cm    TR Peak grad:   17.8  mmHg MV Decel Time: 264 msec    TR Vmax:        211.00 cm/s MV E velocity: 68.70 cm/s MV A velocity: 74.90 cm/s  SHUNTS MV E/A ratio:  0.92        Systemic VTI:  0.22 m                            Systemic Diam: 2.20 cm Carolan Clines Electronically signed by Carolan Clines Signature Date/Time: 10/11/2022/1:11:38 PM    Final    CT Angio Head Neck W WO CM  Result Date: 10/10/2022 CLINICAL DATA:  Vertigo, central EXAM: CT ANGIOGRAPHY HEAD AND NECK WITH AND WITHOUT CONTRAST TECHNIQUE: Multidetector CT imaging of the head and neck was performed using the standard protocol during bolus administration of intravenous contrast. Multiplanar CT image reconstructions and MIPs were obtained to evaluate the vascular anatomy. Carotid stenosis measurements (when applicable) are obtained utilizing NASCET criteria, using the distal internal carotid diameter as the denominator. RADIATION DOSE REDUCTION: This exam was performed according to the departmental dose-optimization program which includes automated exposure control, adjustment of the mA and/or kV according to patient size and/or use of iterative reconstruction technique. CONTRAST:  75mL OMNIPAQUE IOHEXOL 350 MG/ML SOLN COMPARISON:  None Available. FINDINGS: CT HEAD FINDINGS Brain: Left cerebellar infarcts better characterized on same day MRI. No evidence of acute hemorrhage, midline shift or hydrocephalus. Vascular: See below. Skull: No acute fracture. Sinuses/Orbits: Clear sinuses.  No acute orbital findings. Other: No mastoid effusions. Review of the MIP images confirms the above findings CTA NECK FINDINGS Aortic arch: Great vessel origins are patent. Right carotid system: No evidence of dissection, stenosis (50% or greater), or occlusion. Left carotid system: No evidence of dissection, stenosis (50% or greater), or occlusion. Vertebral arteries: Codominant. No evidence of dissection, stenosis (50% or greater), or occlusion in the neck. Skeleton: Negative. Other neck: No evidence  of acute abnormality on limited assessment. Upper chest: Visualized lung apices are clear. Review of the MIP images confirms the above findings CTA HEAD FINDINGS Anterior circulation: Hypoplastic right A1 ACA, likely congenital. Bilateral intracranial ICAs, MCAs and ACAs are patent. Severe right and moderate left intracranial ICA stenosis. Moderate right M1 MCA stenosis. Posterior circulation: Linear filling defect in the proximal left intradural vertebral artery (for example see series 11, image 151; series 13, image 111). Intradural vertebral arteries are patent. The basilar artery and both posterior cerebral arteries are patent without proximal high-grade stenosis. Mild irregularity of the basilar artery, most likely atherosclerotic. Moderate right P2 PCA stenosis. Venous sinuses: As permitted by contrast timing, patent. Review of the MIP images confirms the above findings IMPRESSION: 1. Linear filling defect in the proximal left intradural vertebral artery is suspicious for dissection. 2. No large vessel occlusion. 3. Severe right and moderate left intracranial ICA stenosis. 4. Severe right vertebral artery origin stenosis. 5. Moderate right M1 MCA stenosis. 6. Moderate right P2 PCA stenosis. Findings discussed with Dr. Amada Jupiter via telephone at 7:30 p.m. Electronically Signed   By:  Feliberto Harts M.D.   On: 10/10/2022 19:37   MR Brain Wo Contrast (neuro protocol)  Result Date: 10/10/2022 CLINICAL DATA:  Neuro deficit, acute, stroke suspected EXAM: MRI HEAD WITHOUT CONTRAST TECHNIQUE: Multiplanar, multiecho pulse sequences of the brain and surrounding structures were obtained without intravenous contrast. COMPARISON:  None Available. FINDINGS: Brain: There are acute infarcts in the left cerebellum (series 3, image 9-13). No hemorrhage. No hydrocephalus. No extra-axial fluid collection. Sequela of mild chronic microvascular ischemic change. No mass effect. No mass lesion. Chronic infarcts in the left  corona radiata. Vascular: Normal flow voids. Skull and upper cervical spine: Severe spinal canal stenosis C4-C5 Sinuses/Orbits: No middle ear or mastoid effusion. Paranasal sinuses are clear. Orbits are unremarkable. Other: None. IMPRESSION: 1. Acute infarcts in the left cerebellum. No hemorrhage. 2. Severe spinal canal stenosis at C4-C5. Consider further evaluation with a cervical spine MRI. Electronically Signed   By: Lorenza Cambridge M.D.   On: 10/10/2022 14:38    Microbiology: Results for orders placed or performed during the hospital encounter of 04/18/12  Surgical pcr screen     Status: None   Collection Time: 04/18/12  2:35 PM   Specimen: Nasal Mucosa; Nasal Swab  Result Value Ref Range Status   MRSA, PCR NEGATIVE NEGATIVE Final   Staphylococcus aureus NEGATIVE NEGATIVE Final    Comment:        The Xpert SA Assay (FDA approved for NASAL specimens in patients over 32 years of age), is one component of a comprehensive surveillance program.  Test performance has been validated by [employer] for patients greater than or equal to 100 year old. It is not intended to diagnose infection nor to guide or monitor treatment.    Labs: CBC: Recent Labs  Lab 10/10/22 1140  WBC 10.6*  NEUTROABS 7.1  HGB 12.0*  HCT 38.5*  MCV 90.4  PLT 267   Basic Metabolic Panel: Recent Labs  Lab 10/10/22 1140 10/12/22 0910  NA 141 137  K 3.0* 3.4*  CL 103 103  CO2 21* 25  GLUCOSE 198* 103*  BUN 19 17  CREATININE 1.12 1.14  CALCIUM 9.2 9.5   Liver Function Tests: No results for input(s): "AST", "ALT", "ALKPHOS", "BILITOT", "PROT", "ALBUMIN" in the last 168 hours. CBG: Recent Labs  Lab 10/10/22 1232  GLUCAP 161*    Discharge time spent: approximately 35 minutes spent on discharge counseling, evaluation of patient on day of discharge, and coordination of discharge planning with nursing, social work, pharmacy and case management  Signed: Alberteen Sam, MD [facility] 10/12/2022

## 2022-10-12 NOTE — Progress Notes (Signed)
Fanny Dance, MD  Physician Physical Medicine and Rehabilitation   PMR Pre-admission    Signed   Date of Service: 10/11/2022  1:47 PM  Related encounter: ED to Hosp-Admission (Current) from 10/10/2022 in [place] [facility]   Signed     Expand All Collapse All  Show:Clear all [x] Written[x] Templated[x] Copied  Added by: [x] Standley Brooking, RN  [] Hover for details PMR Admission Coordinator Pre-Admission Assessment   Patient: Eric Dickerson is an 72 y.o., male MRN: [id number] DOB: Apr 28, 1950 Height: 6\' 2"  (188 cm) Weight: 77.2 kg                                                                                                                                                  Insurance Information HMO:     PPO: yes     PCP:      IPA:      80/20:      OTHER:  PRIMARY: [employer]      Policy#: [id number]      Subscriber: pt CM Name: Hoyle Barr      Phone#: [phone] ext [phone]     Fax#: [phone] Pre-Cert#: [id number] approved for 7 days 9/5 until 9/12  updates to [place] phone [phone] ext [phone] and fax [phone]    Employer:  Benefits:  Phone #: [phone]     Name: 9/4 Eff. Date: 02/06/22     Deduct: none      Out of Pocket Max: $4000      Life Max: none  CIR: $160 co pay per day days 1 until 10      SNF: no c co pay per day days 1 until 20; $50 co pay per day days 21 until 100 Outpatient: $20 per visit     Co-Pay: per medical neccesity Home Health: 100%      Co-Pay:  DME: 80%     Co-Pay: 20% Providers: in network  SECONDARY: none   Financial Counselor:       Phone#:    The Data processing manager" for patients in Inpatient Rehabilitation Facilities with attached "Privacy Act Statement-Health Care Records" was provided and verbally reviewed with: Family   Emergency Contact Information Contact Information       Name Relation Home Work Mobile    Verhoeven,Irene Spouse [phone]   [phone]    Hough,Angela Daughter  [phone]             Other Contacts   None on File      Current Medical History  Patient Admitting Diagnosis: CVA   History of Present Illness: Eric Dickerson is a 72 y.o. male with PMH of CAD s/p CABG,  Hx of R hip arthroplasty, GERD, hypertension, hyperlipidemia, PAD, lumbar spinal stenosis who presented to the hospital  on 10/10/22 for dizziness that started abruptly when he was  at work.  Patient reported he was diagnosed with vertigo about a month ago but symptoms worsened significantly at that time.    He had an MRI of his brain on 10/10/2022 which showed acute infarcts in the left cerebellum and severe spinal canal stenosis at C4-5.  Patient was seen by neurology and is continued on Plavix.  Patient reports hearing is overall decreased in his left ear.  He was seen by speech therapy and was found to have difficulty with attention, memory and executive functioning.  He is on a heart healthy diet with thin liquids.     Complete NIHSS TOTAL: 0 Glasgow Coma Scale Score: 15   Patient's medical record from [facility] has been reviewed by the rehabilitation admission coordinator and physician.   Past Medical History      Past Medical History:  Diagnosis Date   ANEMIA, MILD     CAD (coronary artery disease)      a. CAD,  b. s/p Cypher DES to the ramus and Promus DES x 2 to the RCA 08/2007 (minimal disease in LAD and CFX at that time), c. Myoview 8/10: EF 63%, inf thinning, small prior ant infarct, no ischemia;  d. s/p CABG 3/14 (L-LAD, RIMA-OM1)   DEGENERATIVE JOINT DISEASE, RIGHT HIP     DIVERTICULOSIS, COLON     ERECTILE DYSFUNCTION     GERD     Heart murmur     HIATAL HERNIA     HTN (hypertension)     Hyperlipidemia      statin intolerant   HYPERPLASIA PROSTATE UNS W/O UR OBST & OTH LUTS     LUMBAR RADICULOPATHY, RIGHT     Other abnormal glucose     PAD (peripheral artery disease) (HCC)      pre CABG 04/2012 => ABIs: Right 0.92, left 0.61 // ABI 09/02/14: R 0.93; L 0.82    SPINAL STENOSIS, LUMBAR          Has the patient had major surgery during 100 days prior to admission? No   Family History  family history includes Diabetes in his maternal grandmother; Heart attack (age of onset: 56) in his father; Heart attack (age of onset: 48) in his brother; Hyperlipidemia in his brother and mother; Hypertension in his father; Pulmonary embolism in his brother; Stroke in his brother.   Current Medications   Current Medications    Current Facility-Administered Medications:    acetaminophen (TYLENOL) tablet 650 mg, 650 mg, Oral, Q4H PRN **OR** acetaminophen (TYLENOL) 160 MG/5ML solution 650 mg, 650 mg, Per Tube, Q4H PRN **OR** acetaminophen (TYLENOL) suppository 650 mg, 650 mg, Rectal, Q4H PRN, Danford, Earl Lites, MD   aspirin EC tablet 81 mg, 81 mg, Oral, Daily, Marvel Plan, MD, 81 mg at 10/12/22 0948   clopidogrel (PLAVIX) tablet 75 mg, 75 mg, Oral, Daily, Danford, Earl Lites, MD, 75 mg at 10/12/22 0948   enoxaparin (LOVENOX) injection 40 mg, 40 mg, Subcutaneous, Q24H, Danford, Earl Lites, MD, 40 mg at 10/11/22 1855   ezetimibe (ZETIA) tablet 10 mg, 10 mg, Oral, Once per day on Monday Wednesday Friday, Danford, Earl Lites, MD, 10 mg at 10/11/22 0946   feeding supplement (ENSURE ENLIVE / ENSURE PLUS) liquid 237 mL, 237 mL, Oral, BID BM, Danford, Christopher P, MD, 237 mL at 10/12/22 0948   ondansetron (ZOFRAN) injection 4 mg, 4 mg, Intravenous, Q6H PRN, Danford, Earl Lites, MD, 4 mg at 10/10/22 2007   Oral care mouth rinse, 15 mL, Mouth Rinse, PRN, Danford,  Earl Lites, MD   polyethylene glycol (MIRALAX / GLYCOLAX) packet 17 g, 17 g, Oral, Daily, Danford, Earl Lites, MD, 17 g at 10/12/22 0948   tamsulosin (FLOMAX) capsule 0.4 mg, 0.4 mg, Oral, QHS, Danford, Earl Lites, MD, 0.4 mg at 10/11/22 2103     Patients Current Diet:  Diet Order                  Diet Heart Fluid consistency: Thin  Diet effective now                        Precautions / Restrictions Precautions Precautions: Fall Restrictions Weight Bearing Restrictions: No    Has the patient had 2 or more falls or a fall with injury in the past year?No   Prior Activity Level Community (5-7x/wk): independent and working part time 5 hrs per day, driving   Prior Functional Level Prior Function Prior Level of Function : Independent/Modified Independent Mobility Comments: indepedent without AD prior to admission ADLs Comments: working part time at [employer] location, enjoys fishing and taking his boat out   Self Care: Did the patient need help bathing, dressing, using the toilet or eating?  Independent   Indoor Mobility: Did the patient need assistance with walking from room to room (with or without device)? Independent   Stairs: Did the patient need assistance with internal or external stairs (with or without device)? Independent   Functional Cognition: Did the patient need help planning regular tasks such as shopping or remembering to take medications? Independent   Patient Information Are you of Hispanic, Latino/a,or Spanish origin?: A. No, not of Hispanic, Latino/a, or Spanish origin What is your race?: B. Black or African American Do you need or want an interpreter to communicate with a doctor or health care staff?: 0. No   Patient's Response To:  Health Literacy and Transportation Is the patient able to respond to health literacy and transportation needs?: Yes Health Literacy - How often do you need to have someone help you when you read instructions, pamphlets, or other written material from your doctor or pharmacy?: Never In the past 12 months, has lack of transportation kept you from medical appointments or from getting medications?: No In the past 12 months, has lack of transportation kept you from meetings, work, or from getting things needed for daily living?: No   Home Assistive Devices / Equipment Home Assistive  Devices/Equipment: None Home Equipment: Agricultural consultant (2 wheels), Shower seat - built in (need to confirm built in shower seat)   Prior Device Use: Indicate devices/aids used by the patient prior to current illness, exacerbation or injury? None of the above   Current Functional Level Cognition   Overall Cognitive Status: Impaired/Different from baseline Current Attention Level: Selective Orientation Level: Oriented X4 Safety/Judgement: Decreased awareness of safety, Decreased awareness of deficits General Comments: cues for safety and DME use, showing insight into balance deficits but not the severity of it. very pleasant Attention: Sustained Sustained Attention: Impaired Sustained Attention Impairment: Verbal basic Memory: Impaired Memory Impairment: Storage deficit, Retrieval deficit, Decreased recall of new information, Decreased short term memory (working memory) Decreased Short Term Memory: Verbal basic Awareness: Appears intact Problem Solving: Impaired Problem Solving Impairment: Functional complex Executive Function: Self Monitoring, Self Correcting Reasoning: Impaired Self Monitoring: Impaired Self Correcting: Impaired    Extremity Assessment (includes Sensation/Coordination)   Upper Extremity Assessment: Right hand dominant, RUE deficits/detail RUE Deficits / Details: R shoulder mildly weaker than L UE  at 3+/5. some impaired coordination with increased time to manage smaller movements RUE Coordination: decreased fine motor  Lower Extremity Assessment: Defer to PT evaluation     ADLs   Overall ADL's : Needs assistance/impaired Eating/Feeding: Independent Grooming: Minimal assistance, Standing Upper Body Bathing: Set up, Sitting Lower Body Bathing: Moderate assistance, Sitting/lateral leans, Sit to/from stand Upper Body Dressing : Set up, Sitting Lower Body Dressing: Moderate assistance, Sitting/lateral leans, Sit to/from stand Toilet Transfer: Minimal assistance,  Moderate assistance, Ambulation, Rolling walker (2 wheels) Toilet Transfer Details (indicate cue type and reason): frequent LOB walking to bathroom with RW, improving on bathroom exit though at least Min A still needed for balance correction. Min A to stand from lower toilet with use of grab bars Toileting- Clothing Manipulation and Hygiene: Minimal assistance, Sitting/lateral lean, Sit to/from stand Toileting - Clothing Manipulation Details (indicate cue type and reason): clothing mgmt assist Functional mobility during ADLs: Minimal assistance, Moderate assistance, Rolling walker (2 wheels), Cueing for sequencing, Cueing for safety     Mobility   Overal bed mobility: Needs Assistance Bed Mobility: Supine to Sit, Sit to Supine Rolling: Supervision Supine to sit: Supervision Sit to supine: Supervision General bed mobility comments: HOB elevated     Transfers   Overall transfer level: Needs assistance Equipment used: Rolling walker (2 wheels) Transfers: Sit to/from Stand Sit to Stand: Min assist General transfer comment: stood from EOB with RW and min A, then demonstrated posterior LOB requiring mod A to correct     Ambulation / Gait / Stairs / Wheelchair Mobility   Ambulation/Gait Ambulation/Gait assistance: Mod assist Gait Distance (Feet): 25 Feet Assistive device: Rolling walker (2 wheels) Gait Pattern/deviations: Decreased stride length, Wide base of support, Trunk flexed General Gait Details: wide and general unsteady BOS with LOB in numerous directions requiring cues to remain inside RW BOS for safety Gait velocity: decreased Gait velocity interpretation: <1.31 ft/sec, indicative of household ambulator Pre-gait activities: marches while standing EOB     Posture / Balance Dynamic Sitting Balance Sitting balance - Comments: sitting EOB, reporting mild dizziness but BP WFL per pt Balance Overall balance assessment: Needs assistance Sitting-balance support: Feet supported, No  upper extremity supported Sitting balance-Leahy Scale: Fair Sitting balance - Comments: sitting EOB, reporting mild dizziness but BP WFL per pt Standing balance support: Bilateral upper extremity supported, Reliant on assistive device for balance, During functional activity Standing balance-Leahy Scale: Poor Standing balance comment: required min A for static standing balance and mod A for dynamic standing balance     Special needs/care consideration      Previous Home Environment  Living Arrangements: Spouse/significant other Available Help at Discharge: Family Type of Home: House Home Layout: One level Home Access: Stairs to enter [employer] of Steps: small threshold Bathroom Shower/Tub: Health visitor: Pharmacist, community: Yes Home Care Services: No Additional Comments: has RW from hip replacement   Discharge Living Setting Plans for Discharge Living Setting: Patient's home, Lives with (comment) (wife) Type of Home at Discharge: House Discharge Home Layout: One level Discharge Home Access: Stairs to enter Entrance Stairs-Rails: None Entrance Stairs-Number of Steps:  (smalll threshold) Discharge Bathroom Shower/Tub: Walk-in shower Discharge Bathroom Toilet: Standard Discharge Bathroom Accessibility: Yes How Accessible: Accessible via walker Does the patient have any problems obtaining your medications?: No   Social/Family/Support Systems Patient Roles: Spouse, Parent (employee part time) Contact Information: wife Anticipated Caregiver: wife and daughter Anticipated Caregiver's Contact Information: see contacts Ability/Limitations of Caregiver: wife works Water engineer, but dtr works  from home Caregiver Availability: 24/7 Discharge Plan Discussed with Primary Caregiver: Yes Is Caregiver In Agreement with Plan?: Yes Does Caregiver/Family have Issues with Lodging/Transportation while Pt is in Rehab?: No   Goals Patient/Family Goal for  Rehab: Mod I to supervision with PT, OT and SLP Expected length of stay: ELOS 5 to 7 days Pt/Family Agrees to Admission and willing to participate: Yes Program Orientation Provided & Reviewed with Pt/Caregiver Including Roles  & Responsibilities: Yes   Decrease burden of Care through IP rehab admission: n/a   Possible need for SNF placement upon discharge:not anticipated   Patient Condition: This patient's medical and functional status has changed since the consult dated: 10/11/22 in which the Rehabilitation Physician determined and documented that the patient's condition is appropriate for intensive rehabilitative care in an inpatient rehabilitation facility. See "History of Present Illness" (above) for medical update. Functional changes are: min to mod assist. Patient's medical and functional status update has been discussed with the Rehabilitation physician and patient remains appropriate for inpatient rehabilitation. Will admit to inpatient rehab today.   Preadmission Screen Completed By:  Clois Dupes, RN MSN 10/12/2022 1:07 PM ______________________________________________________________________   Discussed status with Dr. Natale Lay on 10/12/22 at 1308 and received approval for admission today.   Admission Coordinator:  Clois Dupes RN MSN time 1610 Date 10/12/22            Revision History  Routing History

## 2022-10-12 NOTE — Progress Notes (Signed)
Patient discharging to CIR. Transported via wheelchair to CIR [id number].  Wife and mother at bedside.  CIR staff made aware of patient's arrival.  Left in bed with call bell in reach and bed alarm on and bed in lowest position.

## 2022-10-12 NOTE — H&P (Signed)
Physical Medicine and Rehabilitation Admission H&P   CC: Functional deficits secondary to   HPI: Eric Dickerson is a 72 year old male who presented to the ED with sudden onset of dizziness and gait instability as well as feeling of falling if he stood up with room spinning. Seen by neurology. His last known well was somewhere around 10 AM-he was outside the window for IV TNKase by the time neurological consultation was obtained. MRI showed cerebellar infarcts. Given his prior cardiac history, atheroembolic versus cardioembolic source of stroke is suspected-further workup needed. Plavix started. Intolerant of statins and aspirin.  Has mild left UE and LE dysmetria on exam but significant imbalance on walking with PT/OT.  CT of neck performed with  left V4 stenotic plaque versus dissection, right P2, right M1, bilateral siphon stenosis, right VA origin severe stenosis. Added aspirin and recommend DAPT for 3 months given left VA stenosis followed by aspirin alone. Zetia three times weekly.  Patient had hypokalemia that was repleted.  He states he has had urinary urgency and frequency for some time and is taking tamulosin. Also,reports waxing and waning left hearing acuity some of which he attributes to cerumen build up. Tolerating diet. Says he felt constipated after hospital admission and had small formed stool this morning. Given Miralax. Normally has 1-2 BMs per day.  Wife is at bedside. Tolerating diet.   Review of Systems  Constitutional:  Negative for chills and fever.  HENT:  Negative for congestion and sore throat.   Eyes:  Negative for blurred vision and double vision.  Respiratory:  Negative for cough and hemoptysis.   Cardiovascular:  Negative for palpitations.  Gastrointestinal:  Negative for nausea and vomiting.  Genitourinary:  Positive for urgency. Negative for dysuria.  Neurological:  Negative for dizziness and headaches.  Psychiatric/Behavioral:  Negative for depression. The patient  is not nervous/anxious.    Past Medical History:  Diagnosis Date   ANEMIA, MILD    CAD (coronary artery disease)    a. CAD,  b. s/p Cypher DES to the ramus and Promus DES x 2 to the RCA 08/2007 (minimal disease in LAD and CFX at that time), c. Myoview 8/10: EF 63%, inf thinning, small prior ant infarct, no ischemia;  d. s/p CABG 3/14 (L-LAD, RIMA-OM1)   DEGENERATIVE JOINT DISEASE, RIGHT HIP    DIVERTICULOSIS, COLON    ERECTILE DYSFUNCTION    GERD    Heart murmur    HIATAL HERNIA    HTN (hypertension)    Hyperlipidemia    statin intolerant   HYPERPLASIA PROSTATE UNS W/O UR OBST & OTH LUTS    LUMBAR RADICULOPATHY, RIGHT    Other abnormal glucose    PAD (peripheral artery disease) (HCC)    pre CABG 04/2012 => ABIs: Right 0.92, left 0.61 // ABI 09/02/14: R 0.93; L 0.82   SPINAL STENOSIS, LUMBAR    Past Surgical History:  Procedure Laterality Date   COLONOSCOPY  02/07/2000   negative   COLONOSCOPY  08/2017   [place], Dr Myrtie Neither   CORONARY ARTERY BYPASS GRAFT N/A 04/22/2012   Procedure: CORONARY ARTERY BYPASS GRAFTING (CABG);  Surgeon: Loreli Slot, MD;  Location: [facility] OR;  Service: Open Heart Surgery;  Laterality: N/A;  CABG X 2, BIMA, POSSIBLE EVH   CORONARY STENT PLACEMENT  02/07/2008    X 2   TEE WITHOUT CARDIOVERSION N/A 04/22/2012   Procedure: TRANSESOPHAGEAL ECHOCARDIOGRAM (TEE);  Surgeon: Loreli Slot, MD;  Location: [facility] OR;  Service: Open Heart Surgery;  Laterality: N/A;   TOTAL HIP ARTHROPLASTY Right 02/07/2007   UPPER GASTROINTESTINAL ENDOSCOPY     Family History  Problem Relation Age of Onset   Hyperlipidemia Mother    Hypertension Father    Heart attack Father 50       smoker   Stroke Brother        2 brothers ; 44 & 54   Hyperlipidemia Brother    Pulmonary embolism Brother    Heart attack Brother 72   Diabetes Maternal Grandmother    Cancer Neg Hx    COPD Neg Hx    Colon cancer Neg Hx    Esophageal cancer Neg Hx    Stomach cancer Neg Hx    Rectal  cancer Neg Hx    Colon polyps Neg Hx    Social History:  reports that he quit smoking about 37 years ago. His smoking use included cigarettes. He started smoking about 49 years ago. He has a 18 pack-year smoking history. He has never used smokeless tobacco. He reports that he does not drink alcohol and does not use drugs. Allergies:  Allergies  Allergen Reactions   Accupril [Quinapril Hcl] Cough   Ace Inhibitors Other (See Comments)    ACE inhibitors are contraindicated because of a history of rash with Angiotensin receptor blocker.   Calan [Verapamil] Other (See Comments)    Sun sensitivity   Crestor [Rosuvastatin] Other (See Comments)    Myalgias Cramps   Glucosamine Other (See Comments)    Unknown reaction   Lipitor [Atorvastatin] Other (See Comments)    Myalgias  Cramps   Livalo [Pitavastatin] Other (See Comments)    Cramps   Pravachol [Pravastatin] Other (See Comments)    Myalgias Cramps   Statins Other (See Comments)    Myalgias, cramping with: Crestor, Lipitor, Livalo, Pravachol.   Zetia [Ezetimibe] Other (See Comments)    Myalgias Cramps  Pt ok on 3x weekly dose   Benicar [Olmesartan] Rash   Medications Prior to Admission  Medication Sig Dispense Refill   acetaminophen (TYLENOL) 500 MG tablet Take 500 mg by mouth daily as needed for moderate pain, fever or headache.     [START ON 10/13/2022] aspirin EC 81 MG tablet Take 1 tablet (81 mg total) by mouth daily. Swallow whole.     [START ON 10/13/2022] clopidogrel (PLAVIX) 75 MG tablet Take 1 tablet (75 mg total) by mouth daily.     ezetimibe (ZETIA) 10 MG tablet Take 10 mg by mouth 3 (three) times a week.     feeding supplement (ENSURE ENLIVE / ENSURE PLUS) LIQD Take 237 mLs by mouth 2 (two) times daily between meals.     meclizine (ANTIVERT) 12.5 MG tablet Take 1 tablet (12.5 mg total) by mouth 3 (three) times daily as needed for dizziness. 40 tablet 1   Multiple Vitamins-Minerals (CENTRUM SILVER 50+MEN) TABS Take 1 tablet  by mouth daily.     nitroGLYCERIN (NITROSTAT) 0.4 MG SL tablet Take 1 tab under your tongue for chest pain  If no relief of pain may repeat NTG, one tab every 5 minutes up to 3 tablets total over 15 minutes. (Patient not taking: Reported on 10/10/2022) 25 tablet 3   tamsulosin (FLOMAX) 0.4 MG CAPS capsule Take 0.4 mg by mouth at bedtime.        Home: Home Living Family/patient expects to be discharged to:: Private residence Living Arrangements: Spouse/significant other, Children, Other relatives   Functional History:    Functional Status:  Mobility:  ADL:    Cognition: Home: Home Living Family/patient expects to be discharged to:: Private residence Living Arrangements: Spouse/significant other Available Help at Discharge: Family Type of Home: House Home Access: Stairs to enter Secretary/administrator of Steps: small threshold Home Layout: One level Bathroom Shower/Tub: Health visitor: Standard Bathroom Accessibility: Yes Home Equipment: Agricultural consultant (2 wheels), Shower seat - built in (need to confirm built in shower seat) Additional Comments: has RW from hip replacement   Functional History: Prior Function Prior Level of Function : Independent/Modified Independent Mobility Comments: indepedent without AD prior to admission ADLs Comments: working part time at [employer] location, enjoys fishing and taking his boat out   Functional Status:  Mobility: Bed Mobility Overal bed mobility: Needs Assistance Bed Mobility: Supine to Sit, Sit to Supine Rolling: Supervision Supine to sit: Supervision Sit to supine: Supervision General bed mobility comments: HOB elevated Transfers Overall transfer level: Needs assistance Equipment used: Rolling walker (2 wheels) Transfers: Sit to/from Stand Sit to Stand: Min assist General transfer comment: stood from EOB with RW and min A, then demonstrated posterior LOB requiring mod A to  correct Ambulation/Gait Ambulation/Gait assistance: Mod assist Gait Distance (Feet): 25 Feet Assistive device: Rolling walker (2 wheels) Gait Pattern/deviations: Decreased stride length, Wide base of support, Trunk flexed General Gait Details: wide and general unsteady BOS with LOB in numerous directions requiring cues to remain inside RW BOS for safety Gait velocity: decreased Gait velocity interpretation: <1.31 ft/sec, indicative of household ambulator Pre-gait activities: marches while standing EOB   ADL: ADL Overall ADL's : Needs assistance/impaired Eating/Feeding: Independent Grooming: Minimal assistance, Standing Upper Body Bathing: Set up, Sitting Lower Body Bathing: Moderate assistance, Sitting/lateral leans, Sit to/from stand Upper Body Dressing : Set up, Sitting Lower Body Dressing: Moderate assistance, Sitting/lateral leans, Sit to/from stand Toilet Transfer: Minimal assistance, Moderate assistance, Ambulation, Rolling walker (2 wheels) Toilet Transfer Details (indicate cue type and reason): frequent LOB walking to bathroom with RW, improving on bathroom exit though at least Min A still needed for balance correction. Min A to stand from lower toilet with use of grab bars Toileting- Clothing Manipulation and Hygiene: Minimal assistance, Sitting/lateral lean, Sit to/from stand Toileting - Clothing Manipulation Details (indicate cue type and reason): clothing mgmt assist Functional mobility during ADLs: Minimal assistance, Moderate assistance, Rolling walker (2 wheels), Cueing for sequencing, Cueing for safety   Cognition: Cognition Overall Cognitive Status: Impaired/Different from baseline Orientation Level: Oriented X4 Attention: Sustained Sustained Attention: Impaired Sustained Attention Impairment: Verbal basic Memory: Impaired Memory Impairment: Storage deficit, Retrieval deficit, Decreased recall of new information, Decreased short term memory (working  memory) Decreased Short Term Memory: Verbal basic Awareness: Appears intact Problem Solving: Impaired Problem Solving Impairment: Functional complex Executive Function: Self Monitoring, Self Correcting Reasoning: Impaired Self Monitoring: Impaired Self Correcting: Impaired Cognition Arousal: Alert Behavior During Therapy: WFL for tasks assessed/performed Overall Cognitive Status: Impaired/Different from baseline Area of Impairment: Safety/judgement, Attention, Problem solving, Awareness Current Attention Level: Selective Safety/Judgement: Decreased awareness of safety, Decreased awareness of deficits Awareness: Emergent Problem Solving: Difficulty sequencing, Requires verbal cues General Comments: cues for safety and DME use, showing insight into balance deficits but not the severity of it. very pleasant    Physical Exam: Blood pressure (!) 136/94, pulse 74, temperature 98.6 F (37 C), temperature source Oral, resp. rate 17, height 6\' 2"  (1.88 m), weight 80.4 kg, SpO2 99%.  General: No apparent distress, lying in bed appears comfortable HEENT: Head is normocephalic, atraumatic, PERRLA, EOMI, sclera anicteric, oral mucosa  pink and moist Neck: Supple without JVD or lymphadenopathy Heart: Reg rate and rhythm.  Chest: CTA bilaterally without wheezes, rales, or rhonchi; no distress Abdomen: Soft, non-tender, non-distended, bowel sounds positive. Extremities: No clubbing, cyanosis, or edema. Pulses are 2+ Psych: Pt's affect is appropriate. Pt is cooperative Skin: Clean and intact without signs of breakdown Neuro: Alert and oriented x 4, follows commands, cranial nerves II through XII intact other than hearing acuity decreased in left ear.  Able to name and repeat.  Able to remember 3 out of 3 words 5 minutes later.  Dysarthria or aphasia noted. RUE: 5/5 Deltoid, 5/5 Biceps, 5/5 Triceps, 5/5 Wrist Ext, 5/5 Grip LUE: 5/5 Deltoid, 5/5 Biceps, 5/5 Triceps, 5/5 Wrist Ext, 5/5 Grip RLE: HF  5/5, KE 5/5, ADF 5/5, APF 5/5 LLE: HF 5/5, KE 5/5, ADF 5/5, APF 5/5 Sensory exam normal for light touch and pain in all 4 limbs.  I was not able to observe any dysmetria with finger-nose or heel-to-shin No tremors noted Musculoskeletal: No joint swelling or tenderness noted No abnormal tone, normal muscle bulk   Results for orders placed or performed during the hospital encounter of 10/10/22 (from the past 48 hour(s))  Lipid panel     Status: Abnormal   Collection Time: 10/11/22  7:39 AM  Result Value Ref Range   Cholesterol 222 (H) 0 - 200 mg/dL   Triglycerides 59 <657 mg/dL   HDL 58 >84 mg/dL   Total CHOL/HDL Ratio 3.8 RATIO   VLDL 12 0 - 40 mg/dL   LDL Cholesterol 696 (H) 0 - 99 mg/dL    Comment:        Total Cholesterol/HDL:CHD Risk Coronary Heart Disease Risk Table                     Men   Women  1/2 Average Risk   3.4   3.3  Average Risk       5.0   4.4  2 X Average Risk   9.6   7.1  3 X Average Risk  23.4   11.0        Use the calculated Patient Ratio above and the CHD Risk Table to determine the patient's CHD Risk.        ATP III CLASSIFICATION (LDL):  <100     mg/dL   Optimal  295-284  mg/dL   Near or Above                    Optimal  130-159  mg/dL   Borderline  132-440  mg/dL   High  >102     mg/dL   Very High Performed at [facility], [address], [place], [place] [address]   Hemoglobin A1c     Status: Abnormal   Collection Time: 10/11/22  7:39 AM  Result Value Ref Range   Hgb A1c MFr Bld 5.7 (H) 4.8 - 5.6 %    Comment: (NOTE) Pre diabetes:          5.7%-6.4%  Diabetes:              >6.4%  Glycemic control for   <7.0% adults with diabetes    Mean Plasma Glucose 116.89 mg/dL    Comment: Performed at [facility], [address] [place], [place] [address]  Rapid urine drug screen (hospital performed)     Status: None   Collection Time: 10/11/22  8:00 AM  Result Value Ref  Range   Opiates NONE DETECTED NONE DETECTED   Cocaine NONE  DETECTED NONE DETECTED   Benzodiazepines NONE DETECTED NONE DETECTED   Amphetamines NONE DETECTED NONE DETECTED   Tetrahydrocannabinol NONE DETECTED NONE DETECTED   Barbiturates NONE DETECTED NONE DETECTED    Comment: (NOTE) DRUG SCREEN FOR MEDICAL PURPOSES ONLY.  IF CONFIRMATION IS NEEDED FOR ANY PURPOSE, NOTIFY LAB WITHIN 5 DAYS.  LOWEST DETECTABLE LIMITS FOR URINE DRUG SCREEN Drug Class                     Cutoff (ng/mL) Amphetamine and metabolites    1000 Barbiturate and metabolites    200 Benzodiazepine                 200 Opiates and metabolites        300 Cocaine and metabolites        300 THC                            50 Performed at [facility], [address], [place], [place] [address]   Basic metabolic panel     Status: Abnormal   Collection Time: 10/12/22  9:10 AM  Result Value Ref Range   Sodium 137 135 - 145 mmol/L   Potassium 3.4 (L) 3.5 - 5.1 mmol/L   Chloride 103 98 - 111 mmol/L   CO2 25 22 - 32 mmol/L   Glucose, Bld 103 (H) 70 - 99 mg/dL    Comment: Glucose reference range applies only to samples taken after fasting for at least 8 hours.   BUN 17 8 - 23 mg/dL   Creatinine, Ser 2.13 0.61 - 1.24 mg/dL   Calcium 9.5 8.9 - 08.6 mg/dL   GFR, Estimated >57 >84 mL/min    Comment: (NOTE) Calculated using the CKD-EPI Creatinine Equation (2021)    Anion gap 9 5 - 15    Comment: Performed at [facility], [address] [place], [place] [address]   ECHOCARDIOGRAM COMPLETE  Result Date: 10/11/2022    ECHOCARDIOGRAM REPORT   Patient Name:   CAINEN RICCO Date of Exam: 10/11/2022 Medical Rec #:  [id number]     Height:       74.0 in Accession #:    [id number]    Weight:       170.2 lb Date of Birth:  1950-05-30      BSA:          2.029 m Patient Age:    72 years      BP:           136/79 mmHg Patient Gender: M             HR:           66 bpm. Exam Location:  Inpatient Procedure: 2D Echo, Color Doppler and Cardiac Doppler Indications:    stroke  History:         Patient has no prior history of Echocardiogram examinations.                 Prior CABG, PAD; Risk Factors:Hypertension and Dyslipidemia.  Sonographer:    Delcie Roch RDCS Referring Phys: [id number] CHRISTOPHER P DANFORD IMPRESSIONS  1. Left ventricular ejection fraction, by estimation, is 60 to 65%. The left ventricle has normal function. The left ventricle has no regional wall motion abnormalities. Left ventricular diastolic parameters are consistent with Grade I diastolic dysfunction (  impaired relaxation).  2. Right ventricular systolic function is normal. The right ventricular size is normal. There is normal pulmonary artery systolic pressure.  3. The mitral valve is normal in structure. No evidence of mitral valve regurgitation.  4. The aortic valve is tricuspid. Aortic valve regurgitation is not visualized. Aortic valve sclerosis/calcification is present, without any evidence of aortic stenosis.  5. Pulmonic valve regurgitation is moderate.  6. There is mild dilatation of the ascending aorta, measuring 40 mm.  7. The inferior vena cava is normal in size with greater than 50% respiratory variability, suggesting right atrial pressure of 3 mmHg. FINDINGS  Left Ventricle: Left ventricular ejection fraction, by estimation, is 60 to 65%. The left ventricle has normal function. The left ventricle has no regional wall motion abnormalities. The left ventricular internal cavity size was normal in size. There is  no left ventricular hypertrophy. Left ventricular diastolic parameters are consistent with Grade I diastolic dysfunction (impaired relaxation). Right Ventricle: The right ventricular size is normal. Right ventricular systolic function is normal. There is normal pulmonary artery systolic pressure. The tricuspid regurgitant velocity is 2.11 m/s, and with an assumed right atrial pressure of 3 mmHg,  the estimated right ventricular systolic pressure is 20.8 mmHg. Left Atrium: Left atrial size was normal in  size. Right Atrium: Right atrial size was normal in size. Pericardium: There is no evidence of pericardial effusion. Mitral Valve: The mitral valve is normal in structure. No evidence of mitral valve regurgitation. Tricuspid Valve: Tricuspid valve regurgitation is mild. Aortic Valve: The aortic valve is tricuspid. Aortic valve regurgitation is not visualized. Aortic valve sclerosis/calcification is present, without any evidence of aortic stenosis. Pulmonic Valve: Pulmonic valve regurgitation is moderate. Aorta: There is mild dilatation of the ascending aorta, measuring 40 mm. Venous: The inferior vena cava is normal in size with greater than 50% respiratory variability, suggesting right atrial pressure of 3 mmHg. IAS/Shunts: No atrial level shunt detected by color flow Doppler.  LEFT VENTRICLE PLAX 2D LVIDd:         4.10 cm   Diastology LVIDs:         2.90 cm   LV e' medial:    9.79 cm/s LV PW:         1.00 cm   LV E/e' medial:  7.0 LV IVS:        0.90 cm   LV e' lateral:   11.50 cm/s LVOT diam:     2.20 cm   LV E/e' lateral: 6.0 LV SV:         83 LV SV Index:   41 LVOT Area:     3.80 cm  RIGHT VENTRICLE            IVC RV Basal diam:  2.80 cm    IVC diam: 1.50 cm RV S prime:     9.90 cm/s TAPSE (M-mode): 1.7 cm LEFT ATRIUM             Index        RIGHT ATRIUM           Index LA diam:        3.60 cm 1.77 cm/m   RA Area:     14.70 cm LA Vol (A2C):   40.4 ml 19.91 ml/m  RA Volume:   36.80 ml  18.13 ml/m LA Vol (A4C):   42.4 ml 20.89 ml/m LA Biplane Vol: 43.5 ml 21.43 ml/m  AORTIC VALVE LVOT Vmax:   123.00 cm/s LVOT Vmean:  74.900 cm/s LVOT VTI:    0.218 m  AORTA Ao Root diam: 3.00 cm Ao Asc diam:  4.00 cm MITRAL VALVE               TRICUSPID VALVE MV Area (PHT): 2.87 cm    TR Peak grad:   17.8 mmHg MV Decel Time: 264 msec    TR Vmax:        211.00 cm/s MV E velocity: 68.70 cm/s MV A velocity: 74.90 cm/s  SHUNTS MV E/A ratio:  0.92        Systemic VTI:  0.22 m                            Systemic Diam: 2.20 cm  Carolan Clines Electronically signed by Carolan Clines Signature Date/Time: 10/11/2022/1:11:38 PM    Final       Blood pressure (!) 136/94, pulse 74, temperature 98.6 F (37 C), temperature source Oral, resp. rate 17, height 6\' 2"  (1.88 m), weight 80.4 kg, SpO2 99%.  Medical Problem List and Plan: 1. Functional deficits secondary to infarcts of the left cerebellum likely secondary to left V4 atherosclerotic stenosis versus dissection  -patient may  shower  -ELOS/Goals: 5-7 days,  -Admit to CIR, mod I to supervision with PT, OT, SLP  2.  Antithrombotics: -DVT/anticoagulation:  Pharmaceutical: Lovenox  -antiplatelet therapy: Aspirin and Plavix for three months followed by aspirin alone  3. Pain Management: Tylenol as needed  4. Mood/Behavior/Sleep: LCSW to evaluate and provide emotional support  -antipsychotic agents: n/a  5. Neuropsych/cognition: This patient is capable of making decisions on his own behalf.  6. Skin/Wound Care: Routine skin care checks   7. Fluids/Electrolytes/Nutrition: Routine Is and Os and follow-up chemistries  8: Urinary retention: continue Flomax  9: Hyperlipidemia: continue Zetia  10: CAD s/p CABG/stents 2014; hx of PAD.  Denies chest pain  -maintained on Plavix due to GERD  -on Zetia (cardiologist encouraged PSK9 or Nexlizet>>lipid clinic follow-up)  -follows with Dr Eden Emms  11: Borderline prediabetes: A1c = 5.7%.  Dietary education  12: Hypokalemia: improving; follow-up BMP  13:Leukocytosis: update labs; Tm 99.6 last night, 9/04  14: Hypertension: monitor TID and prn  -home amlodipine 10 mg held, resume as appropriate   Milinda Antis, PA-C 10/12/2022  I have personally performed a face to face diagnostic evaluation of this patient and formulated the key components of the plan.  Additionally, I have personally reviewed laboratory data, imaging studies, as well as relevant notes and concur with the physician assistant's documentation above.  The  patient's status has not changed from the original H&P.  Any changes in documentation from the acute care chart have been noted above.  Fanny Dance, MD, FAAPMR   I have personally performed a face to face diagnostic evaluation of this patient and formulated the key components of the plan.  Additionally, I have personally reviewed laboratory data, imaging studies, as well as relevant notes and concur with the physician assistant's documentation above.  The patient's status has not changed from the original H&P.  Any changes in documentation from the acute care chart have been noted above.  Fanny Dance, MD, [place] Dom

## 2022-10-12 NOTE — Progress Notes (Signed)
Fanny Dance, MD  Physician Physical Medicine and Rehabilitation   Consult Note    Signed   Date of Service: 10/11/2022 10:48 AM  Related encounter: ED to Hosp-Admission (Current) from 10/10/2022 in [facility]   Signed     Expand All Collapse All  Show:Clear all [x] Written[x] Templated[] Copied  Added by: [x] Fanny Dance, MD  [] Hover for details          Physical Medicine and Rehabilitation Consult Reason for Consult:Rehab Referring Physician: Dr. Maryfrances Bunnell     HPI: Eric Dickerson is a 72 y.o. male with PMH of CAD s/p CABG,  Hx of R hip arthroplasty, GERD, hypertension, hyperlipidemia, PAD, lumbar spinal stenosis who presented to the hospital for dizziness that started abruptly when he was at work.  Patient reported he was diagnosed with vertigo about a month ago but symptoms worsened significantly at that time.  He had an MRI of his brain on 10/10/2022 which showed acute infarcts in the left cerebellum and severe spinal canal stenosis at C4-5.  Patient was seen by neurology and is continued on Plavix.  Patient reports hearing is overall decreased in his left ear.  He was seen by speech therapy and was found to have difficulty with attention, memory and executive functioning.  He is on a heart healthy diet with thin liquids.  He was also seen by physical therapy requiring mod assist for sit to stand and ambulation.     Patient lives in a [place] home with just threshold to enter.  He lives with his wife.  Works but could take time off if needed.  He also has daughters that can help if needed.   Review of Systems  Constitutional:  Negative for chills and fever.  HENT:  Positive for hearing loss.   Eyes:  Positive for blurred vision. Negative for double vision.  Respiratory:  Negative for shortness of breath.   Cardiovascular:  Negative for chest pain.  Gastrointestinal:  Negative for abdominal pain, nausea and vomiting.  Genitourinary:  Negative  for dysuria.  Skin:  Negative for rash.  Neurological:  Positive for dizziness. Negative for sensory change, speech change, focal weakness and headaches.        Past Medical History:  Diagnosis Date   ANEMIA, MILD     CAD (coronary artery disease)      a. CAD,  b. s/p Cypher DES to the ramus and Promus DES x 2 to the RCA 08/2007 (minimal disease in LAD and CFX at that time), c. Myoview 8/10: EF 63%, inf thinning, small prior ant infarct, no ischemia;  d. s/p CABG 3/14 (L-LAD, RIMA-OM1)   DEGENERATIVE JOINT DISEASE, RIGHT HIP     DIVERTICULOSIS, COLON     ERECTILE DYSFUNCTION     GERD     Heart murmur     HIATAL HERNIA     HTN (hypertension)     Hyperlipidemia      statin intolerant   HYPERPLASIA PROSTATE UNS W/O UR OBST & OTH LUTS     LUMBAR RADICULOPATHY, RIGHT     Other abnormal glucose     PAD (peripheral artery disease) (HCC)      pre CABG 04/2012 => ABIs: Right 0.92, left 0.61 // ABI 09/02/14: R 0.93; L 0.82   SPINAL STENOSIS, LUMBAR               Past Surgical History:  Procedure Laterality Date   COLONOSCOPY   02/07/2000    negative  COLONOSCOPY   08/2017    [place], Dr Myrtie Neither   CORONARY ARTERY BYPASS GRAFT N/A 04/22/2012    Procedure: CORONARY ARTERY BYPASS GRAFTING (CABG);  Surgeon: Loreli Slot, MD;  Location: [facility] OR;  Service: Open Heart Surgery;  Laterality: N/A;  CABG X 2, BIMA, POSSIBLE EVH   CORONARY STENT PLACEMENT   02/07/2008     X 2   TEE WITHOUT CARDIOVERSION N/A 04/22/2012    Procedure: TRANSESOPHAGEAL ECHOCARDIOGRAM (TEE);  Surgeon: Loreli Slot, MD;  Location: [facility] OR;  Service: Open Heart Surgery;  Laterality: N/A;   TOTAL HIP ARTHROPLASTY Right 02/07/2007   UPPER GASTROINTESTINAL ENDOSCOPY                 Family History  Problem Relation Age of Onset   Hyperlipidemia Mother     Hypertension Father     Heart attack Father 35        smoker   Stroke Brother          2 brothers ; 64 & 41   Hyperlipidemia Brother     Pulmonary embolism  Brother     Heart attack Brother 66   Diabetes Maternal Grandmother     Cancer Neg Hx     COPD Neg Hx     Colon cancer Neg Hx     Esophageal cancer Neg Hx     Stomach cancer Neg Hx     Rectal cancer Neg Hx     Colon polyps Neg Hx          Social History:  reports that he quit smoking about 37 years ago. His smoking use included cigarettes. He started smoking about 49 years ago. He has a 18 pack-year smoking history. He has never used smokeless tobacco. He reports that he does not drink alcohol and does not use drugs. Allergies:  Allergies       Allergies  Allergen Reactions   Accupril [Quinapril Hcl] Cough   Ace Inhibitors Other (See Comments)      ACE inhibitors are contraindicated because of a history of rash with Angiotensin receptor blocker.   Calan [Verapamil] Other (See Comments)      Sun sensitivity   Crestor [Rosuvastatin] Other (See Comments)      Myalgias Cramps   Glucosamine Other (See Comments)      Unknown reaction   Lipitor [Atorvastatin] Other (See Comments)      Myalgias  Cramps   Livalo [Pitavastatin] Other (See Comments)      Cramps   Pravachol [Pravastatin] Other (See Comments)      Myalgias Cramps   Statins Other (See Comments)      Myalgias, cramping with: Crestor, Lipitor, Livalo, Pravachol.   Zetia [Ezetimibe] Other (See Comments)      Myalgias Cramps   Pt ok on 3x weekly dose   Benicar [Olmesartan] Rash            Medications Prior to Admission  Medication Sig Dispense Refill   acetaminophen (TYLENOL) 500 MG tablet Take 500 mg by mouth daily as needed for moderate pain, fever or headache.       amLODipine (NORVASC) 10 MG tablet TAKE 1 TABLET BY MOUTH DAILY 90 tablet 3   ezetimibe (ZETIA) 10 MG tablet Take 10 mg by mouth 3 (three) times a week.       meclizine (ANTIVERT) 12.5 MG tablet Take 1 tablet (12.5 mg total) by mouth 3 (three) times daily as needed for dizziness. 40 tablet 1  Multiple Vitamins-Minerals (CENTRUM SILVER 50+MEN) TABS  Take 1 tablet by mouth daily.       tamsulosin (FLOMAX) 0.4 MG CAPS capsule Take 0.4 mg by mouth at bedtime.       nitroGLYCERIN (NITROSTAT) 0.4 MG SL tablet Take 1 tab under your tongue for chest pain  If no relief of pain may repeat NTG, one tab every 5 minutes up to 3 tablets total over 15 minutes. (Patient not taking: Reported on 10/10/2022) 25 tablet 3          Home: Home Living Family/patient expects to be discharged to:: Private residence Living Arrangements: Spouse/significant other Available Help at Discharge: Family, Available PRN/intermittently Type of Home: House Home Access: Stairs to enter Secretary/administrator of Steps: small threshold Home Layout: One level Bathroom Shower/Tub: Health visitor: Standard Bathroom Accessibility: Yes Home Equipment: Agricultural consultant (2 wheels) Additional Comments: has RW from hip replacement  Functional History: Prior Function Prior Level of Function : Independent/Modified Independent Mobility Comments: indepedent without AD prior to admission Functional Status:  Mobility: Bed Mobility Overal bed mobility: Needs Assistance Bed Mobility: Rolling, Supine to Sit, Sit to Supine Rolling: Supervision Supine to sit: Supervision Sit to supine: Supervision General bed mobility comments: HOB elevated Transfers Overall transfer level: Needs assistance Equipment used: Rolling walker (2 wheels) Transfers: Sit to/from Stand Sit to Stand: Mod assist General transfer comment: stood from EOB with RW and min A, then demonstrated posterior LOB requiring mod A to correct Ambulation/Gait Ambulation/Gait assistance: Mod assist Gait Distance (Feet): 25 Feet Assistive device: Rolling walker (2 wheels) Gait Pattern/deviations: Decreased stride length, Wide base of support, Trunk flexed General Gait Details: wide and general unsteady BOS with LOB in numerous directions requiring cues to remain inside RW BOS for safety Gait velocity:  decreased Gait velocity interpretation: <1.31 ft/sec, indicative of household ambulator Pre-gait activities: marches while standing EOB   ADL:   Cognition: Cognition Overall Cognitive Status: Impaired/Different from baseline Orientation Level: Oriented X4 Attention: Sustained Sustained Attention: Impaired Sustained Attention Impairment: Verbal basic Memory: Impaired Memory Impairment: Storage deficit, Retrieval deficit, Decreased recall of new information, Decreased short term memory (working memory) Decreased Short Term Memory: Verbal basic Awareness: Appears intact Problem Solving: Impaired Problem Solving Impairment: Functional complex Executive Function: Self Monitoring, Self Correcting Reasoning: Impaired Self Monitoring: Impaired Self Correcting: Impaired Cognition Arousal: Alert Behavior During Therapy: WFL for tasks assessed/performed Overall Cognitive Status: Impaired/Different from baseline   Blood pressure 136/79, pulse 65, temperature 98.1 F (36.7 C), temperature source Oral, resp. rate 20, height 6\' 2"  (1.88 m), weight 77.2 kg, SpO2 100%. Physical Exam     General: No apparent distress HEENT: Head is normocephalic, atraumatic, PERRLA, EOMI, sclera anicteric, oral mucosa pink and moist Neck: Supple without JVD or lymphadenopathy Heart: Reg rate and rhythm.  Chest: CTA bilaterally without wheezes, rales, or rhonchi; no distress Abdomen: Soft, non-tender, non-distended, bowel sounds positive. Extremities: No clubbing, cyanosis, or edema. Pulses are 2+ Psych: Pt's affect is appropriate. Pt is cooperative Skin: Clean and intact without signs of breakdown Neuro: Alert and oriented x 4, follows commands, cranial nerves II through XII intact other than hearing acuity decreased in left ear.  Able to name and repeat.  Able to remember 2 out of 3 words 5 minutes later.  Dysarthria or aphasia noted. RUE: 5/5 Deltoid, 5/5 Biceps, 5/5 Triceps, 5/5 Wrist Ext, 5/5 Grip LUE:  5/5 Deltoid, 5/5 Biceps, 5/5 Triceps, 5/5 Wrist Ext, 5/5 Grip RLE: HF 5/5, KE 5/5, ADF 5/5, APF 5/5  LLE: HF 5/5, KE 5/5, ADF 5/5, APF 5/5 Sensory exam normal for light touch and pain in all 4 limbs. No limb ataxia or cerebellar signs. Musculoskeletal: No joint swelling or tenderness noted No abnormal tone, normal muscle bulk     Lab Results Last 24 Hours       Results for orders placed or performed during the hospital encounter of 10/10/22 (from the past 24 hour(s))  Basic metabolic panel     Status: Abnormal    Collection Time: 10/10/22 11:40 AM  Result Value Ref Range    Sodium 141 135 - 145 mmol/L    Potassium 3.0 (L) 3.5 - 5.1 mmol/L    Chloride 103 98 - 111 mmol/L    CO2 21 (L) 22 - 32 mmol/L    Glucose, Bld 198 (H) 70 - 99 mg/dL    BUN 19 8 - 23 mg/dL    Creatinine, Ser 4.74 0.61 - 1.24 mg/dL    Calcium 9.2 8.9 - 25.9 mg/dL    GFR, Estimated >56 >38 mL/min    Anion gap 17 (H) 5 - 15  CBC WITH DIFFERENTIAL     Status: Abnormal    Collection Time: 10/10/22 11:40 AM  Result Value Ref Range    WBC 10.6 (H) 4.0 - 10.5 K/uL    RBC 4.26 4.22 - 5.81 MIL/uL    Hemoglobin 12.0 (L) 13.0 - 17.0 g/dL    HCT 75.6 (L) 43.3 - 52.0 %    MCV 90.4 80.0 - 100.0 fL    MCH 28.2 26.0 - 34.0 pg    MCHC 31.2 30.0 - 36.0 g/dL    RDW 29.5 18.8 - 41.6 %    Platelets 267 150 - 400 K/uL    nRBC 0.0 0.0 - 0.2 %    Neutrophils Relative % 66 %    Neutro Abs 7.1 1.7 - 7.7 K/uL    Lymphocytes Relative 22 %    Lymphs Abs 2.3 0.7 - 4.0 K/uL    Monocytes Relative 9 %    Monocytes Absolute 0.9 0.1 - 1.0 K/uL    Eosinophils Relative 1 %    Eosinophils Absolute 0.1 0.0 - 0.5 K/uL    Basophils Relative 1 %    Basophils Absolute 0.1 0.0 - 0.1 K/uL    Immature Granulocytes 1 %    Abs Immature Granulocytes 0.08 (H) 0.00 - 0.07 K/uL  CBG monitoring, ED     Status: Abnormal    Collection Time: 10/10/22 12:32 PM  Result Value Ref Range    Glucose-Capillary 161 (H) 70 - 99 mg/dL  Lipid panel     Status:  Abnormal    Collection Time: 10/11/22  7:39 AM  Result Value Ref Range    Cholesterol 222 (H) 0 - 200 mg/dL    Triglycerides 59 <606 mg/dL    HDL 58 >30 mg/dL    Total CHOL/HDL Ratio 3.8 RATIO    VLDL 12 0 - 40 mg/dL    LDL Cholesterol 160 (H) 0 - 99 mg/dL  Hemoglobin F0X     Status: Abnormal    Collection Time: 10/11/22  7:39 AM  Result Value Ref Range    Hgb A1c MFr Bld 5.7 (H) 4.8 - 5.6 %    Mean Plasma Glucose 116.89 mg/dL       Imaging Results (Last 48 hours)  CT Angio Head Neck W WO CM   Result Date: 10/10/2022 CLINICAL DATA:  Vertigo, central EXAM: CT ANGIOGRAPHY HEAD AND NECK WITH  AND WITHOUT CONTRAST TECHNIQUE: Multidetector CT imaging of the head and neck was performed using the standard protocol during bolus administration of intravenous contrast. Multiplanar CT image reconstructions and MIPs were obtained to evaluate the vascular anatomy. Carotid stenosis measurements (when applicable) are obtained utilizing NASCET criteria, using the distal internal carotid diameter as the denominator. RADIATION DOSE REDUCTION: This exam was performed according to the departmental dose-optimization program which includes automated exposure control, adjustment of the mA and/or kV according to patient size and/or use of iterative reconstruction technique. CONTRAST:  75mL OMNIPAQUE IOHEXOL 350 MG/ML SOLN COMPARISON:  None Available. FINDINGS: CT HEAD FINDINGS Brain: Left cerebellar infarcts better characterized on same day MRI. No evidence of acute hemorrhage, midline shift or hydrocephalus. Vascular: See below. Skull: No acute fracture. Sinuses/Orbits: Clear sinuses.  No acute orbital findings. Other: No mastoid effusions. Review of the MIP images confirms the above findings CTA NECK FINDINGS Aortic arch: Great vessel origins are patent. Right carotid system: No evidence of dissection, stenosis (50% or greater), or occlusion. Left carotid system: No evidence of dissection, stenosis (50% or greater), or  occlusion. Vertebral arteries: Codominant. No evidence of dissection, stenosis (50% or greater), or occlusion in the neck. Skeleton: Negative. Other neck: No evidence of acute abnormality on limited assessment. Upper chest: Visualized lung apices are clear. Review of the MIP images confirms the above findings CTA HEAD FINDINGS Anterior circulation: Hypoplastic right A1 ACA, likely congenital. Bilateral intracranial ICAs, MCAs and ACAs are patent. Severe right and moderate left intracranial ICA stenosis. Moderate right M1 MCA stenosis. Posterior circulation: Linear filling defect in the proximal left intradural vertebral artery (for example see series 11, image 151; series 13, image 111). Intradural vertebral arteries are patent. The basilar artery and both posterior cerebral arteries are patent without proximal high-grade stenosis. Mild irregularity of the basilar artery, most likely atherosclerotic. Moderate right P2 PCA stenosis. Venous sinuses: As permitted by contrast timing, patent. Review of the MIP images confirms the above findings IMPRESSION: 1. Linear filling defect in the proximal left intradural vertebral artery is suspicious for dissection. 2. No large vessel occlusion. 3. Severe right and moderate left intracranial ICA stenosis. 4. Severe right vertebral artery origin stenosis. 5. Moderate right M1 MCA stenosis. 6. Moderate right P2 PCA stenosis. Findings discussed with Dr. Amada Jupiter via telephone at 7:30 p.m. Electronically Signed   By: Feliberto Harts M.D.   On: 10/10/2022 19:37    MR Brain Wo Contrast (neuro protocol)   Result Date: 10/10/2022 CLINICAL DATA:  Neuro deficit, acute, stroke suspected EXAM: MRI HEAD WITHOUT CONTRAST TECHNIQUE: Multiplanar, multiecho pulse sequences of the brain and surrounding structures were obtained without intravenous contrast. COMPARISON:  None Available. FINDINGS: Brain: There are acute infarcts in the left cerebellum (series 3, image 9-13). No hemorrhage.  No hydrocephalus. No extra-axial fluid collection. Sequela of mild chronic microvascular ischemic change. No mass effect. No mass lesion. Chronic infarcts in the left corona radiata. Vascular: Normal flow voids. Skull and upper cervical spine: Severe spinal canal stenosis C4-C5 Sinuses/Orbits: No middle ear or mastoid effusion. Paranasal sinuses are clear. Orbits are unremarkable. Other: None. IMPRESSION: 1. Acute infarcts in the left cerebellum. No hemorrhage. 2. Severe spinal canal stenosis at C4-C5. Consider further evaluation with a cervical spine MRI. Electronically Signed   By: Lorenza Cambridge M.D.   On: 10/10/2022 14:38       Assessment/Plan: Diagnosis: Acute infarcts of the left cerebellum Does the need for close, 24 hr/day medical supervision in concert with the patient's rehab  needs make it unreasonable for this patient to be served in a less intensive setting? Yes Co-Morbidities requiring supervision/potential complications:  -Hypokalemia, hypertension, peripheral artery disease, BPH, CAD, spinal stenosis Due to bladder management, bowel management, safety, skin/wound care, disease management, medication administration, pain management, and patient education, does the patient require 24 hr/day rehab nursing? Yes Does the patient require coordinated care of a physician, rehab nurse, therapy disciplines of PT, OT, SLP to address physical and functional deficits in the context of the above medical diagnosis(es)? Yes Addressing deficits in the following areas: balance, endurance, locomotion, strength, transferring, bowel/bladder control, bathing, dressing, feeding, grooming, toileting, cognition, speech, language, and psychosocial support Can the patient actively participate in an intensive therapy program of at least 3 hrs of therapy per day at least 5 days per week? Yes The potential for patient to make measurable gains while on inpatient rehab is excellent Anticipated functional outcomes upon  discharge from inpatient rehab are modified independent  with PT, modified independent with OT, modified independent with SLP. Estimated rehab length of stay to reach the above functional goals is: 5-7 Anticipated discharge destination: Home Overall Rehab/Functional Prognosis: excellent   POST ACUTE RECOMMENDATIONS: This patient's condition is appropriate for continued rehabilitative care in the following setting: CIR Patient has agreed to participate in recommended program. Yes Note that insurance prior authorization may be required for reimbursement for recommended care.   Comment: I think he would be a good candidate for CIR pending medical stability.   I have personally performed a face to face diagnostic evaluation of this patient. Additionally, I have examined the patient's medical record including any pertinent labs and radiographic images. If the physician assistant has documented in this note, I have reviewed and edited or otherwise concur with the physician assistant's documentation.   Thanks,   Fanny Dance, MD 10/11/2022             Routing History

## 2022-10-13 ENCOUNTER — Inpatient Hospital Stay (HOSPITAL_COMMUNITY): Payer: Medicare PPO

## 2022-10-13 DIAGNOSIS — R609 Edema, unspecified: Secondary | ICD-10-CM | POA: Diagnosis not present

## 2022-10-13 DIAGNOSIS — I639 Cerebral infarction, unspecified: Secondary | ICD-10-CM | POA: Diagnosis not present

## 2022-10-13 LAB — COMPREHENSIVE METABOLIC PANEL
ALT: 14 U/L (ref 0–44)
AST: 17 U/L (ref 15–41)
Albumin: 3.1 g/dL — ABNORMAL LOW (ref 3.5–5.0)
Alkaline Phosphatase: 69 U/L (ref 38–126)
Anion gap: 11 (ref 5–15)
BUN: 21 mg/dL (ref 8–23)
CO2: 25 mmol/L (ref 22–32)
Calcium: 10 mg/dL (ref 8.9–10.3)
Chloride: 102 mmol/L (ref 98–111)
Creatinine, Ser: 1.36 mg/dL — ABNORMAL HIGH (ref 0.61–1.24)
GFR, Estimated: 55 mL/min — ABNORMAL LOW (ref >60)
Glucose, Bld: 100 mg/dL — ABNORMAL HIGH (ref 70–99)
Potassium: 4.4 mmol/L (ref 3.5–5.1)
Sodium: 138 mmol/L (ref 135–145)
Total Bilirubin: 0.5 mg/dL (ref 0.3–1.2)
Total Protein: 6.9 g/dL (ref 6.5–8.1)

## 2022-10-13 LAB — CBC WITH DIFFERENTIAL/PLATELET
Abs Immature Granulocytes: 0.02 10*3/uL (ref 0.00–0.07)
Basophils Absolute: 0 10*3/uL (ref 0.0–0.1)
Basophils Relative: 0 %
Eosinophils Absolute: 0.1 10*3/uL (ref 0.0–0.5)
Eosinophils Relative: 1 %
HCT: 40.2 % (ref 39.0–52.0)
Hemoglobin: 12.7 g/dL — ABNORMAL LOW (ref 13.0–17.0)
Immature Granulocytes: 0 %
Lymphocytes Relative: 16 %
Lymphs Abs: 1.7 10*3/uL (ref 0.7–4.0)
MCH: 27.2 pg (ref 26.0–34.0)
MCHC: 31.6 g/dL (ref 30.0–36.0)
MCV: 86.1 fL (ref 80.0–100.0)
Monocytes Absolute: 1 10*3/uL (ref 0.1–1.0)
Monocytes Relative: 10 %
Neutro Abs: 7.6 10*3/uL (ref 1.7–7.7)
Neutrophils Relative %: 73 %
Platelets: 256 10*3/uL (ref 150–400)
RBC: 4.67 MIL/uL (ref 4.22–5.81)
RDW: 14.3 % (ref 11.5–15.5)
WBC: 10.4 10*3/uL (ref 4.0–10.5)
nRBC: 0 % (ref 0.0–0.2)

## 2022-10-13 NOTE — IPOC Note (Signed)
Overall Plan of Care [facility]) Patient Details Name: Eric Dickerson MRN: [id number] DOB: [birth year]  Admitting Diagnosis: CVA (cerebral vascular accident) [facility])  Hospital Problems: Principal Problem:   CVA (cerebral vascular accident) [facility])     Functional Problem List: Nursing Endurance, Medication Management, Safety, Bladder  PT Balance, Endurance, Motor, Perception  OT Balance, Endurance, Motor  SLP    TR         Basic ADL's: OT Bathing, Dressing, Toileting     Advanced  ADL's: OT       Transfers: PT Bed Mobility, Bed to Chair, Car, Occupational psychologist, Research scientist (life sciences): PT Ambulation, Psychologist, prison and probation services, Stairs     Additional Impairments: OT None  SLP None      TR      Anticipated Outcomes Item Anticipated Outcome  Self Feeding n/a  Swallowing      Basic self-care  mod I  Toileting  mod I   Bathroom Transfers mod I  Bowel/Bladder  manage bladder w mod I assist  Transfers  Mod I with LRAD  Locomotion  Mod I with LRAD  Communication     Cognition     Pain  n/a  Safety/Judgment  manage w cues   Therapy Plan: PT Intensity: Minimum of 1-2 x/day ,45 to 90 minutes PT Frequency: 5 out of 7 days PT Duration Estimated Length of Stay: 10-12 days OT Intensity: Minimum of 1-2 x/day, 45 to 90 minutes OT Frequency: 5 out of 7 days OT Duration/Estimated Length of Stay: ~10 SLP Duration/Estimated Length of Stay: N/A   Team Interventions: Nursing Interventions Disease Management/Prevention, Medication Management, Discharge Planning, Patient/Family Education  PT interventions Ambulation/gait training, Discharge planning, Functional mobility training, Psychosocial support, Therapeutic Activities, Visual/perceptual remediation/compensation, Warden/ranger, Disease management/prevention, Neuromuscular re-education, Skin care/wound management, Therapeutic Exercise, Wheelchair propulsion/positioning, Cognitive remediation/compensation,  DME/adaptive equipment instruction, Pain management, Splinting/orthotics, UE/LE Strength taining/ROM, Community reintegration, Development worker, international aid stimulation, Patient/family education, Museum/gallery curator, UE/LE Coordination activities  OT Interventions Warden/ranger, Disease mangement/prevention, Neuromuscular re-education, Self Care/advanced ADL retraining, Therapeutic Exercise, DME/adaptive equipment instruction, Pain management, Skin care/wound managment, UE/LE Strength taining/ROM, Community reintegration, Equities trader education, UE/LE Coordination activities, Discharge planning, Functional mobility training, Psychosocial support, Therapeutic Activities, Visual/perceptual remediation/compensation  SLP Interventions    TR Interventions    SW/CM Interventions Discharge Planning, Psychosocial Support, Patient/Family Education, Disease Management/Prevention   Barriers to Discharge MD   balance  Nursing Decreased caregiver support 1 level threshold entry w spouse  PT Inaccessible home environment, Decreased caregiver support, Home environment access/layout wife works during the day  OT      SLP      SW Insurance for SNF coverage     Team Discharge Planning: Destination: PT-Home ,OT- Home , SLP-Home Projected Follow-up: PT-Outpatient PT, OT-  Outpatient OT, SLP-None Projected Equipment Needs: PT-To be determined, OT- To be determined, SLP-None recommended by SLP Equipment Details: PT-has RW, OT-  Patient/family involved in discharge planning: PT- Patient,  OT-Patient, SLP-Patient  MD ELOS: 7d Medical Rehab Prognosis:  Excellent Assessment: The patient has been admitted for CIR therapies with the diagnosis of cerebellar infarct. The team will be addressing functional mobility, strength, stamina, balance, safety, adaptive techniques and equipment, self-care, bowel and bladder mgt, patient and caregiver education, reduce fall risk. Goals have been set at mod I. Anticipated  discharge destination is Home .        See Team Conference Notes for weekly updates to the plan of care

## 2022-10-13 NOTE — Progress Notes (Signed)
Patient appears to have rested well throughout shift, no discomfort or distress noted. Continur regime and monitored

## 2022-10-13 NOTE — Progress Notes (Signed)
PROGRESS NOTE   Subjective/Complaints:  No nausea or dizziness, good appetite, left hand feels a little shaky No double vision   ROS- neg CP, SOB, N/V/D  Objective:   ECHOCARDIOGRAM COMPLETE  Result Date: 10/11/2022    ECHOCARDIOGRAM REPORT   Patient Name:   Eric Dickerson Date of Exam: 10/11/2022 Medical Rec #:  [id number]     Height:       74.0 in Accession #:    [id number]    Weight:       170.2 lb Date of Birth:  1950/06/26      BSA:          2.029 m Patient Age:    72 years      BP:           136/79 mmHg Patient Gender: M             HR:           66 bpm. Exam Location:  Inpatient Procedure: 2D Echo, Color Doppler and Cardiac Doppler Indications:    stroke  History:        Patient has no prior history of Echocardiogram examinations.                 Prior CABG, PAD; Risk Factors:Hypertension and Dyslipidemia.  Sonographer:    Delcie Roch RDCS Referring Phys: [id number] CHRISTOPHER P DANFORD IMPRESSIONS  1. Left ventricular ejection fraction, by estimation, is 60 to 65%. The left ventricle has normal function. The left ventricle has no regional wall motion abnormalities. Left ventricular diastolic parameters are consistent with Grade I diastolic dysfunction (impaired relaxation).  2. Right ventricular systolic function is normal. The right ventricular size is normal. There is normal pulmonary artery systolic pressure.  3. The mitral valve is normal in structure. No evidence of mitral valve regurgitation.  4. The aortic valve is tricuspid. Aortic valve regurgitation is not visualized. Aortic valve sclerosis/calcification is present, without any evidence of aortic stenosis.  5. Pulmonic valve regurgitation is moderate.  6. There is mild dilatation of the ascending aorta, measuring 40 mm.  7. The inferior vena cava is normal in size with greater than 50% respiratory variability, suggesting right atrial pressure of 3 mmHg. FINDINGS  Left Ventricle:  Left ventricular ejection fraction, by estimation, is 60 to 65%. The left ventricle has normal function. The left ventricle has no regional wall motion abnormalities. The left ventricular internal cavity size was normal in size. There is  no left ventricular hypertrophy. Left ventricular diastolic parameters are consistent with Grade I diastolic dysfunction (impaired relaxation). Right Ventricle: The right ventricular size is normal. Right ventricular systolic function is normal. There is normal pulmonary artery systolic pressure. The tricuspid regurgitant velocity is 2.11 m/s, and with an assumed right atrial pressure of 3 mmHg,  the estimated right ventricular systolic pressure is 20.8 mmHg. Left Atrium: Left atrial size was normal in size. Right Atrium: Right atrial size was normal in size. Pericardium: There is no evidence of pericardial effusion. Mitral Valve: The mitral valve is normal in structure. No evidence of mitral valve regurgitation. Tricuspid Valve: Tricuspid valve regurgitation is mild. Aortic Valve: The aortic valve is tricuspid.  Aortic valve regurgitation is not visualized. Aortic valve sclerosis/calcification is present, without any evidence of aortic stenosis. Pulmonic Valve: Pulmonic valve regurgitation is moderate. Aorta: There is mild dilatation of the ascending aorta, measuring 40 mm. Venous: The inferior vena cava is normal in size with greater than 50% respiratory variability, suggesting right atrial pressure of 3 mmHg. IAS/Shunts: No atrial level shunt detected by color flow Doppler.  LEFT VENTRICLE PLAX 2D LVIDd:         4.10 cm   Diastology LVIDs:         2.90 cm   LV e' medial:    9.79 cm/s LV PW:         1.00 cm   LV E/e' medial:  7.0 LV IVS:        0.90 cm   LV e' lateral:   11.50 cm/s LVOT diam:     2.20 cm   LV E/e' lateral: 6.0 LV SV:         83 LV SV Index:   41 LVOT Area:     3.80 cm  RIGHT VENTRICLE            IVC RV Basal diam:  2.80 cm    IVC diam: 1.50 cm RV S prime:      9.90 cm/s TAPSE (M-mode): 1.7 cm LEFT ATRIUM             Index        RIGHT ATRIUM           Index LA diam:        3.60 cm 1.77 cm/m   RA Area:     14.70 cm LA Vol (A2C):   40.4 ml 19.91 ml/m  RA Volume:   36.80 ml  18.13 ml/m LA Vol (A4C):   42.4 ml 20.89 ml/m LA Biplane Vol: 43.5 ml 21.43 ml/m  AORTIC VALVE LVOT Vmax:   123.00 cm/s LVOT Vmean:  74.900 cm/s LVOT VTI:    0.218 m  AORTA Ao Root diam: 3.00 cm Ao Asc diam:  4.00 cm MITRAL VALVE               TRICUSPID VALVE MV Area (PHT): 2.87 cm    TR Peak grad:   17.8 mmHg MV Decel Time: 264 msec    TR Vmax:        211.00 cm/s MV E velocity: 68.70 cm/s MV A velocity: 74.90 cm/s  SHUNTS MV E/A ratio:  0.92        Systemic VTI:  0.22 m                            Systemic Diam: 2.20 cm Carolan Clines Electronically signed by Carolan Clines Signature Date/Time: 10/11/2022/1:11:38 PM    Final    Recent Labs    10/10/22 1140 10/13/22 0612  WBC 10.6* 10.4  HGB 12.0* 12.7*  HCT 38.5* 40.2  PLT 267 256   Recent Labs    10/12/22 0910 10/13/22 0612  NA 137 138  K 3.4* 4.4  CL 103 102  CO2 25 25  GLUCOSE 103* 100*  BUN 17 21  CREATININE 1.14 1.36*  CALCIUM 9.5 10.0    Intake/Output Summary (Last 24 hours) at 10/13/2022 0752 Last data filed at 10/13/2022 0500 Gross per 24 hour  Intake 240 ml  Output 500 ml  Net -260 ml        Physical Exam: Vital Signs Blood pressure 115/79,  pulse 64, temperature 98.3 F (36.8 C), temperature source Oral, resp. rate 16, height 6\' 2"  (1.88 m), weight 80.4 kg, SpO2 99%.   General: No acute distress Mood and affect are appropriate Heart: Regular rate and rhythm no rubs murmurs or extra sounds Lungs: Clear to auscultation, breathing unlabored, no rales or wheezes Abdomen: Positive bowel sounds, soft nontender to palpation, nondistended Extremities: No clubbing, cyanosis, or edema Skin: No evidence of breakdown, no evidence of rash Neurologic: Cranial nerves II through XII intact, motor strength is 5/5 in  bilateral deltoid, bicep, tricep, grip, hip flexor, knee extensors, ankle dorsiflexor and plantar flexor Sensory exam normal sensation to light touch and proprioception in bilateral upper and lower extremities Cerebellar exam normal finger to nose to finger as well as heel to shin in bilateral upper and lower extremities Musculoskeletal: Full range of motion in all 4 extremities. No joint swelling   Assessment/Plan: 1. Functional deficits which require 3+ hours per day of interdisciplinary therapy in a comprehensive inpatient rehab setting. Physiatrist is providing close team supervision and 24 hour management of active medical problems listed below. Physiatrist and rehab team continue to assess barriers to discharge/monitor patient progress toward functional and medical goals  Care Tool:  Bathing              Bathing assist       Upper Body Dressing/Undressing Upper body dressing        Upper body assist      Lower Body Dressing/Undressing Lower body dressing            Lower body assist       Toileting Toileting    Toileting assist       Transfers Chair/bed transfer  Transfers assist           Locomotion Ambulation   Ambulation assist              Walk 10 feet activity   Assist           Walk 50 feet activity   Assist           Walk 150 feet activity   Assist           Walk 10 feet on uneven surface  activity   Assist           Wheelchair     Assist               Wheelchair 50 feet with 2 turns activity    Assist            Wheelchair 150 feet activity     Assist          Blood pressure 115/79, pulse 64, temperature 98.3 F (36.8 C), temperature source Oral, resp. rate 16, height 6\' 2"  (1.88 m), weight 80.4 kg, SpO2 99%.  Medical Problem List and Plan: 1. Functional deficits secondary to infarcts of the left cerebellum likely secondary to left V4 atherosclerotic stenosis  versus dissection             -patient may  shower             -ELOS/Goals: 5-7 days,             -Admit to CIR, mod I to supervision with PT, OT, SLP   2.  Antithrombotics: -DVT/anticoagulation:  Pharmaceutical: Lovenox             -antiplatelet therapy: Aspirin and Plavix for three months followed by aspirin alone  3. Pain Management: Tylenol as needed   4. Mood/Behavior/Sleep: LCSW to evaluate and provide emotional support             -antipsychotic agents: n/a   5. Neuropsych/cognition: This patient is capable of making decisions on his own behalf.   6. Skin/Wound Care: Routine skin care checks   7. Fluids/Electrolytes/Nutrition: Routine Is and Os and follow-up chemistries   8: Urinary retention: continue Flomax   9: Hyperlipidemia: continue Zetia   10: CAD s/p CABG/stents 2014; hx of PAD.  Denies chest pain             -maintained on Plavix due to GERD             -on Zetia (cardiologist encouraged PSK9 or Nexlizet>>lipid clinic follow-up)             -follows with Dr Eden Emms   11: Borderline prediabetes: A1c = 5.7%.  Dietary education   12: Hypokalemia: resolved    13:Leukocytosis: borderline , pt is afeb    Latest Ref Rng & Units 10/13/2022    6:12 AM 10/10/2022   11:40 AM 03/28/2022    2:40 PM  CBC  WBC 4.0 - 10.5 K/uL 10.4  10.6  6.2   Hemoglobin 13.0 - 17.0 g/dL 82.9  56.2  13.0   Hematocrit 39.0 - 52.0 % 40.2  38.5  39.7   Platelets 150 - 400 K/uL 256  267  273.0       14: Hypertension: monitor TID and prn             -home amlodipine 10 mg held, resume as appropriate     Vitals:   10/12/22 1957 10/13/22 0530  BP: 123/79 115/79  Pulse: 83 64  Resp: 18 16  Temp: 98.9 F (37.2 C) 98.3 F (36.8 C)  SpO2: 98% 99%     LOS: 1 days A FACE TO FACE EVALUATION WAS PERFORMED  Victorino Sparrow Kamry Faraci 10/13/2022, 7:52 AM

## 2022-10-13 NOTE — Progress Notes (Signed)
Inpatient Rehabilitation Admission Medication Review by a Pharmacist  A complete drug regimen review was completed for this patient to identify any potential clinically significant medication issues.  High Risk Drug Classes Is patient taking? Indication by Medication  Antipsychotic No   Anticoagulant Yes Lovenox- vte ppx  Antibiotic No   Opioid No   Antiplatelet Yes Asprin, plavix- cva ppx  Hypoglycemics/insulin No   Vasoactive Medication Yes Flomax- BPH  Chemotherapy No   Other Yes Zetia- HLD Ambien- sleep Benadryl- sleep     Type of Medication Issue Identified Description of Issue Recommendation(s)  Drug Interaction(s) (clinically significant)     Duplicate Therapy     Allergy     No Medication Administration End Date     Incorrect Dose     Additional Drug Therapy Needed     Significant med changes from prior encounter (inform family/care partners about these prior to discharge).    Other  PTA meds: norvasc Restart PTA meds when and if necessary during CIR admission or at time of discharge, if warranted    Clinically significant medication issues were identified that warrant physician communication and completion of prescribed/recommended actions by midnight of the next day:  No   Time spent performing this drug regimen review (minutes):  30   Sobia Karger BS, PharmD, BCPS Clinical Pharmacist 10/13/2022 7:55 AM  Contact: [phone] after 3 PM  "Be curious, not judgmental..." -Debbora Dus

## 2022-10-13 NOTE — Evaluation (Signed)
Speech Language Pathology Assessment and Plan  Patient Details  Name: Eric Dickerson MRN: [id number] Date of Birth: 26-Feb-1950  SLP Diagnosis: N/A Rehab Potential: N/A ELOS: N/A    Today's Date: 10/13/2022 SLP Individual Time: 1000-1055 SLP Individual Time Calculation (min): 55 min   Hospital Problem: Principal Problem:   CVA (cerebral vascular accident) [facility])  Past Medical History:  Past Medical History:  Diagnosis Date   ANEMIA, MILD    CAD (coronary artery disease)    a. CAD,  b. s/p Cypher DES to the ramus and Promus DES x 2 to the RCA 08/2007 (minimal disease in LAD and CFX at that time), c. Myoview 8/10: EF 63%, inf thinning, small prior ant infarct, no ischemia;  d. s/p CABG 3/14 (L-LAD, RIMA-OM1)   DEGENERATIVE JOINT DISEASE, RIGHT HIP    DIVERTICULOSIS, COLON    ERECTILE DYSFUNCTION    GERD    Heart murmur    HIATAL HERNIA    HTN (hypertension)    Hyperlipidemia    statin intolerant   HYPERPLASIA PROSTATE UNS W/O UR OBST & OTH LUTS    LUMBAR RADICULOPATHY, RIGHT    Other abnormal glucose    PAD (peripheral artery disease) (HCC)    pre CABG 04/2012 => ABIs: Right 0.92, left 0.61 // ABI 09/02/14: R 0.93; L 0.82   SPINAL STENOSIS, LUMBAR    Past Surgical History:  Past Surgical History:  Procedure Laterality Date   COLONOSCOPY  02/07/2000   negative   COLONOSCOPY  08/2017   TA, Dr Myrtie Neither   CORONARY ARTERY BYPASS GRAFT N/A 04/22/2012   Procedure: CORONARY ARTERY BYPASS GRAFTING (CABG);  Surgeon: Loreli Slot, MD;  Location: [facility] OR;  Service: Open Heart Surgery;  Laterality: N/A;  CABG X 2, BIMA, POSSIBLE EVH   CORONARY STENT PLACEMENT  02/07/2008    X 2   TEE WITHOUT CARDIOVERSION N/A 04/22/2012   Procedure: TRANSESOPHAGEAL ECHOCARDIOGRAM (TEE);  Surgeon: Loreli Slot, MD;  Location: [facility] OR;  Service: Open Heart Surgery;  Laterality: N/A;   TOTAL HIP ARTHROPLASTY Right 02/07/2007   UPPER GASTROINTESTINAL ENDOSCOPY      Assessment / Plan /  Recommendation Clinical Impression Patient is a 72 y.o. male with PMH of CAD s/p CABG,  Hx of R hip arthroplasty, GERD, hypertension, hyperlipidemia, PAD, lumbar spinal stenosis who presented to the hospital  on 10/10/22 for dizziness that started abruptly when he was at work.  Patient reported he was diagnosed with vertigo about a month ago but symptoms worsened significantly at that time. MRI on 10/10/2022 showed acute infarcts in the left cerebellum and severe spinal canal stenosis at C4-5. Therapy evaluations completed with recommendations for CIR. Patient admitted 10/12/22.  SLP administered the Cognistat with patient scoring WFL on all subtests with the exception of impairments in visual construction. Patient's auditory comprehension and verbal expression appeared [facility] for tasks assessed.  Patient independently recalled new, functional information as it relates to his current medications and demonstrated appropriate safety awareness throughout evaluation. The patient requested the urinal nearby due to urgency secondary to inability to ambulate independently at this time. At end of session, patient requested to use the commode. Patient ambulated with Min A with the RW but overall Mod I for safety awareness throughout task. Patient appears to be at his baseline level of cognitive functioning, therefore, skilled SLP intervention is not warranted at this time. Patient reports he would like someone at home with him "for at least the first month" and that family can  provide the appropriate supervision at discharge.     Skilled Therapeutic Interventions          Administered a cognitive-linguistic evaluation, please see above for details.   SLP Assessment  Patient does not need any further Speech Lanaguage Pathology Services    Recommendations  Patient destination: Home Follow up Recommendations: None Equipment Recommended: None recommended by SLP    SLP Frequency N/A  SLP Duration  SLP Intensity  SLP  Treatment/Interventions N/A  N/A  N/A   Pain No/Denies Pain   Prior Functioning Type of Home: House  Lives With: Spouse Available Help at Discharge: Family;Available PRN/intermittently (wife works during the day - gets off around 2pm) Vocation: Part time employment  SLP Evaluation Cognition Overall Cognitive Status: Within Functional Limits for tasks assessed Arousal/Alertness: Awake/alert Orientation Level: Oriented X4 Attention: Selective Sustained Attention: Appears intact Selective Attention: Appears intact Memory: Appears intact Awareness: Appears intact Problem Solving: Appears intact Reasoning: Appears intact Self Monitoring: Appears intact Self Correcting: Appears intact Safety/Judgment: Appears intact  Comprehension Auditory Comprehension Overall Auditory Comprehension: Appears within functional limits for tasks assessed Expression Expression Primary Mode of Expression: Verbal Verbal Expression Overall Verbal Expression: Appears within functional limits for tasks assessed Written Expression Dominant Hand: Right Written Expression: Not tested Oral Motor Oral Motor/Sensory Function Overall Oral Motor/Sensory Function: Within functional limits Motor Speech Overall Motor Speech: Appears within functional limits for tasks assessed  Care Tool Care Tool Cognition Ability to hear (with hearing aid or hearing appliances if normally used Ability to hear (with hearing aid or hearing appliances if normally used): 0. Adequate - no difficulty in normal conservation, social interaction, listening to TV   Expression of Ideas and Wants Expression of Ideas and Wants: 4. Without difficulty (complex and basic) - expresses complex messages without difficulty and with speech that is clear and easy to understand   Understanding Verbal and Non-Verbal Content Understanding Verbal and Non-Verbal Content: 4. Understands (complex and basic) - clear comprehension without cues or  repetitions  Memory/Recall Ability Memory/Recall Ability : Current season;Location of own room;Staff names and faces;That he or she is in a hospital/hospital unit   Short Term Goals: N/A  Refer to Care Plan for Long Term Goals  Recommendations for other services: None   Discharge Criteria: Patient will be discharged from SLP if patient refuses treatment 3 consecutive times without medical reason, if treatment goals not met, if there is a change in medical status, if patient makes no progress towards goals or if patient is discharged from hospital.  The above assessment, treatment plan, treatment alternatives and goals were discussed and mutually agreed upon: by patient  Jakirah Zaun 10/13/2022, 12:33 PM

## 2022-10-13 NOTE — Progress Notes (Signed)
Inpatient Rehabilitation Center Individual Statement of Services  Patient Name:  Eric Dickerson  Date:  10/13/2022  Welcome to the Inpatient Rehabilitation Center.  Our goal is to provide you with an individualized program based on your diagnosis and situation, designed to meet your specific needs.  With this comprehensive rehabilitation program, you will be expected to participate in at least 3 hours of rehabilitation therapies Monday-Friday, with modified therapy programming on the weekends.  Your rehabilitation program will include the following services:  Physical Therapy (PT), Occupational Therapy (OT), Speech Therapy (ST), 24 hour per day rehabilitation nursing, Therapeutic Recreaction (TR), Neuropsychology, Care Coordinator, Rehabilitation Medicine, Nutrition Services, Pharmacy Services, and Other  Weekly team conferences will be held on Wednesdays to discuss your progress.  Your Inpatient Rehabilitation Care Coordinator will talk with you frequently to get your input and to update you on team discussions.  Team conferences with you and your family in attendance may also be held.  Expected length of stay: 5-7 Days  Overall anticipated outcome: Mod I to supervision   Depending on your progress and recovery, your program may change. Your Inpatient Rehabilitation Care Coordinator will coordinate services and will keep you informed of any changes. Your Inpatient Rehabilitation Care Coordinator's name and contact numbers are listed  below.  The following services may also be recommended but are not provided by the Inpatient Rehabilitation Center:   Home Health Rehabiltiation Services Outpatient Rehabilitation Services    Arrangements will be made to provide these services after discharge if needed.  Arrangements include referral to agencies that provide these services.  Your insurance has been verified to be:  [employer]  Your primary doctor is:  Oliver Barre, MD   Pertinent information  will be shared with your doctor and your insurance company.  Inpatient Rehabilitation Care Coordinator:  Lavera Guise, [place] [phone] or [phone]  Information discussed with and copy given to patient by: Andria Rhein, 10/13/2022, 9:12 AM

## 2022-10-13 NOTE — Progress Notes (Signed)
Patient ID: Eric Dickerson, male   DOB: 1950/05/01, 72 y.o.   MRN: [id number] Met with the patient to review current situation, rehab process, team conference and plan of care. Patient reports thought he was having trouble with vertigo; did not realize he was having stroke symptoms. Vestibular eval pending. Reviewed secondary risks including CAD (ASA + Plavix x 3 months then ASA solo), HTN, HLD (LDL 152/Trig 59). Reports was not taking his medication PTA; wants to try statin and Zetia before going to the [facility]. Given information on both medications.  Reviewed dietary modification recommendations for heart healthy diet.  Patient reported left ear hearing is muffled and hears tuning of radio sounds;discussed concerns with the MD.  Continue to follow along to address educational needs to facilitate preparation for discharge. Pamelia Hoit

## 2022-10-13 NOTE — Evaluation (Signed)
Physical Therapy Assessment and Plan  Patient Details  Name: Eric Dickerson MRN: [id number] Date of Birth: 1950-07-26  PT Diagnosis: Abnormal posture, Abnormality of gait, Ataxia, Coordination disorder, Difficulty walking, Dizziness and giddiness, Hemiparesis, and Muscle weakness Rehab Potential: Good ELOS: 10-12 days   Today's Date: 10/13/2022 PT Individual Time: 0830-0924 PT Individual Time Calculation (min): 54 min    Hospital Problem: Principal Problem:   CVA (cerebral vascular accident) [facility])   Past Medical History:  Past Medical History:  Diagnosis Date   ANEMIA, MILD    CAD (coronary artery disease)    a. CAD,  b. s/p Cypher DES to the ramus and Promus DES x 2 to the RCA 08/2007 (minimal disease in LAD and CFX at that time), c. Myoview 8/10: EF 63%, inf thinning, small prior ant infarct, no ischemia;  d. s/p CABG 3/14 (L-LAD, RIMA-OM1)   DEGENERATIVE JOINT DISEASE, RIGHT HIP    DIVERTICULOSIS, COLON    ERECTILE DYSFUNCTION    GERD    Heart murmur    HIATAL HERNIA    HTN (hypertension)    Hyperlipidemia    statin intolerant   HYPERPLASIA PROSTATE UNS W/O UR OBST & OTH LUTS    LUMBAR RADICULOPATHY, RIGHT    Other abnormal glucose    PAD (peripheral artery disease) (HCC)    pre CABG 04/2012 => ABIs: Right 0.92, left 0.61 // ABI 09/02/14: R 0.93; L 0.82   SPINAL STENOSIS, LUMBAR    Past Surgical History:  Past Surgical History:  Procedure Laterality Date   COLONOSCOPY  02/07/2000   negative   COLONOSCOPY  08/2017   TA, Dr Myrtie Neither   CORONARY ARTERY BYPASS GRAFT N/A 04/22/2012   Procedure: CORONARY ARTERY BYPASS GRAFTING (CABG);  Surgeon: Loreli Slot, MD;  Location: [facility] OR;  Service: Open Heart Surgery;  Laterality: N/A;  CABG X 2, BIMA, POSSIBLE EVH   CORONARY STENT PLACEMENT  02/07/2008    X 2   TEE WITHOUT CARDIOVERSION N/A 04/22/2012   Procedure: TRANSESOPHAGEAL ECHOCARDIOGRAM (TEE);  Surgeon: Loreli Slot, MD;  Location: [facility] OR;  Service: Open Heart  Surgery;  Laterality: N/A;   TOTAL HIP ARTHROPLASTY Right 02/07/2007   UPPER GASTROINTESTINAL ENDOSCOPY      Assessment & Plan Clinical Impression: Patient is a 72 y.o. year old male who presented to the ED with sudden onset of dizziness and gait instability as well as feeling of falling if he stood up with room spinning. Seen by neurology. His last known well was somewhere around 10 AM-he was outside the window for IV TNKase by the time neurological consultation was obtained. MRI showed cerebellar infarcts. Given his prior cardiac history, atheroembolic versus cardioembolic source of stroke is suspected-further workup needed. Plavix started. Intolerant of statins and aspirin.  Has mild left UE and LE dysmetria on exam but significant imbalance on walking with PT/OT.  CT of neck performed with  left V4 stenotic plaque versus dissection, right P2, right M1, bilateral siphon stenosis, right VA origin severe stenosis. Added aspirin and recommend DAPT for 3 months given left VA stenosis followed by aspirin alone. Zetia three times weekly.  Patient had hypokalemia that was repleted.   He states he has had urinary urgency and frequency for some time and is taking tamulosin. Also,reports waxing and waning left hearing acuity some of which he attributes to cerumen build up. Tolerating diet. Says he felt constipated after hospital admission and had small formed stool this morning. Given Miralax. Normally has 1-2 BMs per  day.   Wife is at bedside. Tolerating diet.  Patient currently requires min/mod A with mobility secondary to muscle weakness, decreased cardiorespiratoy endurance, impaired timing and sequencing, unbalanced muscle activation, ataxia, and decreased coordination, decreased visual acuity, and decreased standing balance, decreased postural control, hemiplegia, and decreased balance strategies.  Prior to hospitalization, patient was independent  with mobility and lived with Spouse in a House home.   Home access is small thresholdStairs to enter.  Patient will benefit from skilled PT intervention to maximize safe functional mobility, minimize fall risk, and decrease caregiver burden for planned discharge home with intermittent assist.  Anticipate patient will benefit from follow up OP at discharge.  PT - End of Session Activity Tolerance: Tolerates 30+ min activity with multiple rests Endurance Deficit: Yes Endurance Deficit Description: required breaks due to dizziness PT Assessment Rehab Potential (ACUTE/IP ONLY): Good PT Barriers to Discharge: Inaccessible home environment;Decreased caregiver support;Home environment access/layout PT Barriers to Discharge Comments: wife works during the day PT Patient demonstrates impairments in the following area(s): Balance;Endurance;Motor;Perception PT Transfers Functional Problem(s): Bed Mobility;Bed to Chair;Car;Furniture PT Locomotion Functional Problem(s): Ambulation;Wheelchair Mobility;Stairs PT Plan PT Intensity: Minimum of 1-2 x/day ,45 to 90 minutes PT Frequency: 5 out of 7 days PT Duration Estimated Length of Stay: 10-12 days PT Treatment/Interventions: Ambulation/gait training;Discharge planning;Functional mobility training;Psychosocial support;Therapeutic Activities;Visual/perceptual remediation/compensation;Balance/vestibular training;Disease management/prevention;Neuromuscular re-education;Skin care/wound management;Therapeutic Exercise;Wheelchair propulsion/positioning;Cognitive remediation/compensation;DME/adaptive equipment instruction;Pain management;Splinting/orthotics;UE/LE Strength taining/ROM;Community reintegration;Functional electrical stimulation;Patient/family education;Stair training;UE/LE Coordination activities PT Transfers Anticipated Outcome(s): Mod I with LRAD PT Locomotion Anticipated Outcome(s): Mod I with LRAD PT Recommendation Follow Up Recommendations: Outpatient PT Patient destination: Home Equipment  Recommended: To be determined Equipment Details: has RW   PT Evaluation Precautions/Restrictions Precautions Precautions: Fall Restrictions Weight Bearing Restrictions: No Pain Interference Pain Interference Pain Effect on Sleep: 0. Does not apply - I have not had any pain or hurting in the past 5 days Pain Interference with Therapy Activities: 0. Does not apply - I have not received rehabilitationtherapy in the past 5 days Pain Interference with Day-to-Day Activities: 1. Rarely or not at all Home Living/Prior Functioning Home Living Available Help at Discharge: Family;Available PRN/intermittently (wife works during the day - gets off around 2pm) Type of Home: House Home Access: Stairs to enter [employer] of Steps: small threshold Home Layout: One level Bathroom Shower/Tub: Health visitor: Standard Bathroom Accessibility: Yes Additional Comments: has RW from hip replacement  Lives With: Spouse Prior Function Level of Independence: Independent with basic ADLs;Independent with transfers;Independent with homemaking with ambulation;Independent with gait  Able to Take Stairs?: Yes Driving: Yes Vocation: Part time employment Vocation Requirements: warehose sorting rags Vision/Perception  Vision - History Ability to See in Adequate Light: 1 Impaired  Cognition Overall Cognitive Status: Within Functional Limits for tasks assessed Arousal/Alertness: Awake/alert Orientation Level: Oriented X4 Memory: Appears intact Awareness: Appears intact Problem Solving: Appears intact Safety/Judgment: Appears intact Sensation Sensation Light Touch: Appears Intact Hot/Cold: Not tested Proprioception: Impaired by gross assessment Stereognosis: Not tested Coordination Gross Motor Movements are Fluid and Coordinated: No Fine Motor Movements are Fluid and Coordinated: Yes Coordination and Movement Description: grossly uncoordinated due to hemiparesis, ataxia,  decreased balance/coordination, and visual impairments Finger Nose Finger Test: [facility] but slow Heel Shin Test: slightly uncoordinated bilaterally Motor  Motor Motor: Hemiplegia;Ataxia Motor - Skilled Clinical Observations: hemiparesis, ataxia, decreased balance/coordination, and visual impairments  Trunk/Postural Assessment  Cervical Assessment Cervical Assessment: Within Functional Limits Thoracic Assessment Thoracic Assessment: Within Functional Limits Lumbar Assessment Lumbar Assessment: Within Functional Limits Postural Control  Postural Control: Deficits on evaluation Righting Reactions: delayed and inadequate Protective Responses: delayed and inadequate  Balance Balance Balance Assessed: Yes Static Sitting Balance Static Sitting - Balance Support: Feet supported;No upper extremity supported Static Sitting - Level of Assistance: 6: Modified independent (Device/Increase time) Dynamic Sitting Balance Dynamic Sitting - Balance Support: Feet supported;No upper extremity supported Dynamic Sitting - Level of Assistance: 5: Stand by assistance (supervision) Static Standing Balance Static Standing - Balance Support: No upper extremity supported;During functional activity Static Standing - Level of Assistance: 4: Min assist Dynamic Standing Balance Dynamic Standing - Balance Support: No upper extremity supported;During functional activity Dynamic Standing - Level of Assistance: 3: Mod assist Dynamic Standing - Comments: with dynamic gait Extremity Assessment  RLE Assessment RLE Assessment: Exceptions to [facility] General Strength Comments: tested sitting in WC RLE Strength Right Hip Flexion: 4/5 Right Hip ABduction: 4-/5 Right Hip ADduction: 4/5 Right Knee Flexion: 4/5 Right Knee Extension: 4-/5 Right Ankle Dorsiflexion: 4/5 Right Ankle Plantar Flexion: 4/5 LLE Assessment LLE Assessment: Exceptions to [facility] General Strength Comments: tested sitting in WC LLE Strength Left Hip  Flexion: 4/5 Left Hip ABduction: 4-/5 Left Hip ADduction: 4/5 Left Knee Flexion: 4/5 Left Knee Extension: 4/5 Left Ankle Dorsiflexion: 4/5 Left Ankle Plantar Flexion: 4/5  Care Tool Care Tool Bed Mobility Roll left and right activity        Sit to lying activity        Lying to sitting on side of bed activity         Care Tool Transfers Sit to stand transfer   Sit to stand assist level: Minimal Assistance - Patient > 75%    Chair/bed transfer   Chair/bed transfer assist level: Minimal Assistance - Patient > 75%     Toilet transfer   Assist Level: Minimal Assistance - Patient > 75%    Car transfer   Car transfer assist level: Minimal Assistance - Patient > 75%      Care Tool Locomotion Ambulation   Assist level: Moderate Assistance - Patient 50 - 74% Assistive device: Other (comment) (HHA) Max distance: 17ft  Walk 10 feet activity   Assist level: Moderate Assistance - Patient - 50 - 74% Assistive device: Other (comment) (HHA)   Walk 50 feet with 2 turns activity   Assist level: Moderate Assistance - Patient - 50 - 74% Assistive device: Other (comment) (HHA)  Walk 150 feet activity Walk 150 feet activity did not occur: Safety/medical concerns (decreased balance/coordination)      Walk 10 feet on uneven surfaces activity   Assist level: Minimal Assistance - Patient > 75% Assistive device: Other (comment) (bilateral handrails)  Stairs   Assist level: Minimal Assistance - Patient > 75% Stairs assistive device: 2 hand rails Max number of stairs: 12 (6in)  Walk up/down 1 step activity   Walk up/down 1 step (curb) assist level: Minimal Assistance - Patient > 75% Walk up/down 1 step or curb assistive device: 2 hand rails  Walk up/down 4 steps activity   Walk up/down 4 steps assist level: Minimal Assistance - Patient > 75% Walk up/down 4 steps assistive device: 2 hand rails  Walk up/down 12 steps activity   Walk up/down 12 steps assist level: Minimal Assistance -  Patient > 75% Walk up/down 12 steps assistive device: 2 hand rails  Pick up small objects from floor Pick up small object from the floor (from standing position) activity did not occur: Safety/medical concerns (decreased balance/coordination)      Wheelchair Is the  patient using a wheelchair?: Yes Type of Wheelchair: Manual   Wheelchair assist level: Supervision/Verbal cueing Max wheelchair distance: 150ft  Wheel 50 feet with 2 turns activity   Assist Level: Supervision/Verbal cueing  Wheel 150 feet activity   Assist Level: Supervision/Verbal cueing    Refer to Care Plan for Long Term Goals  SHORT TERM GOAL WEEK 1 PT Short Term Goal 1 (Week 1): pt will perform transfers with LRAD and CGA PT Short Term Goal 2 (Week 1): pt will ambulate 185ft with LRAD and CGA PT Short Term Goal 3 (Week 1): pt will navigate 1 small step with LRAD and min A  Recommendations for other services: None   Skilled Therapeutic Intervention Evaluation completed (see details above and below) with education on PT POC and goals and individual treatment initiated with focus on functional mobility/transfers, generalized strengthening and endurance, dynamic standing balance/coordination, simulated car transfers, stair navigation, and ambulation. Received pt finishing on commode. Pt able to void and with BM smear - performed pericare without assist. Doffed gown and donned pull over shirt and pants with supervision. Stood from commode without AD and min A to pull pants over hips and transferred commode<>WC stand<>pivot without AD and min A. Pt educated on PT evaluation, CIR policies, and therapy schedule and agreeable. Pt denied any pain during session.  Pt performed WC mobility 147ft using BUE and supervision to ortho gym. Pt performed simulated car transfer without AD and min A (side stepping into car) then ambulated 61ft on uneven surfaces (ramp) without AD and min/mod A - pt reaching out for BUE support on railing for  external support due to feeling "drunk". Stood in hallway without AD and CGA/min A and ambulated additional 121ft with min/light mod HHA with encouragement to avoid reaching out for handrail. Pt then navigated 12 6in steps with bilateral handrails and min A ascending with a step through and descending with a step to pattern. Pt performed WC mobility 169ft using BUE and supervision back to room and transferred WC<>recliner stand<>pivot without AD and min A. Concluded session with pt sitting in recliner, needs within reach, and chair pad alarm on. Safety plan updated.   Mobility Transfers Transfers: Sit to Stand;Stand to Sit;Stand Pivot Transfers Sit to Stand: Minimal Assistance - Patient > 75% Stand to Sit: Minimal Assistance - Patient > 75% Stand Pivot Transfers: Minimal Assistance - Patient > 75% Transfer (Assistive device): None Locomotion  Gait Ambulation: Yes Gait Assistance: Moderate Assistance - Patient 50-74% Gait Distance (Feet): 140 Feet Assistive device: Other (Comment) (HHA) Gait Gait: Yes Gait Pattern: Impaired Gait Pattern: Step-to pattern;Step-through pattern;Decreased step length - right;Decreased step length - left;Decreased stride length;Wide base of support;Ataxic;Trunk flexed;Poor foot clearance - left;Poor foot clearance - right Gait velocity: decreased Stairs / Additional Locomotion Stairs: Yes Stairs Assistance: Minimal Assistance - Patient > 75% Stair Management Technique: Two rails Number of Stairs: 12 Height of Stairs: 6 Ramp: Minimal Assistance - Patient >75% (bilateral UE support on railings) Wheelchair Mobility Wheelchair Mobility: Yes Wheelchair Assistance: Doctor, general practice: Both upper extremities Wheelchair Parts Management: Needs assistance Distance: 183ft   Discharge Criteria: Patient will be discharged from PT if patient refuses treatment 3 consecutive times without medical reason, if treatment goals not met, if there  is a change in medical status, if patient makes no progress towards goals or if patient is discharged from hospital.  The above assessment, treatment plan, treatment alternatives and goals were discussed and mutually agreed upon: by patient  Daiva Huge  Blima Rich PT, DPT 10/13/2022, 12:14 PM

## 2022-10-13 NOTE — Progress Notes (Signed)
Inpatient Rehabilitation Care Coordinator Assessment and Plan Patient Details  Name: Eric Dickerson MRN: [id number] Date of Birth: Mar 06, 1950  Today's Date: 10/13/2022  Hospital Problems: Principal Problem:   CVA (cerebral vascular accident) [facility])  Past Medical History:  Past Medical History:  Diagnosis Date   ANEMIA, MILD    CAD (coronary artery disease)    a. CAD,  b. s/p Cypher DES to the ramus and Promus DES x 2 to the RCA 08/2007 (minimal disease in LAD and CFX at that time), c. Myoview 8/10: EF 63%, inf thinning, small prior ant infarct, no ischemia;  d. s/p CABG 3/14 (L-LAD, RIMA-OM1)   DEGENERATIVE JOINT DISEASE, RIGHT HIP    DIVERTICULOSIS, COLON    ERECTILE DYSFUNCTION    GERD    Heart murmur    HIATAL HERNIA    HTN (hypertension)    Hyperlipidemia    statin intolerant   HYPERPLASIA PROSTATE UNS W/O UR OBST & OTH LUTS    LUMBAR RADICULOPATHY, RIGHT    Other abnormal glucose    PAD (peripheral artery disease) (HCC)    pre CABG 04/2012 => ABIs: Right 0.92, left 0.61 // ABI 09/02/14: R 0.93; L 0.82   SPINAL STENOSIS, LUMBAR    Past Surgical History:  Past Surgical History:  Procedure Laterality Date   COLONOSCOPY  02/07/2000   negative   COLONOSCOPY  08/2017   TA, Dr Myrtie Neither   CORONARY ARTERY BYPASS GRAFT N/A 04/22/2012   Procedure: CORONARY ARTERY BYPASS GRAFTING (CABG);  Surgeon: Loreli Slot, MD;  Location: [facility] OR;  Service: Open Heart Surgery;  Laterality: N/A;  CABG X 2, BIMA, POSSIBLE EVH   CORONARY STENT PLACEMENT  02/07/2008    X 2   TEE WITHOUT CARDIOVERSION N/A 04/22/2012   Procedure: TRANSESOPHAGEAL ECHOCARDIOGRAM (TEE);  Surgeon: Loreli Slot, MD;  Location: [facility] OR;  Service: Open Heart Surgery;  Laterality: N/A;   TOTAL HIP ARTHROPLASTY Right 02/07/2007   UPPER GASTROINTESTINAL ENDOSCOPY     Social History:  reports that he quit smoking about 37 years ago. His smoking use included cigarettes. He started smoking about 49 years ago. He has a 18  pack-year smoking history. He has never used smokeless tobacco. He reports that he does not drink alcohol and does not use drugs.  Family / Support Systems Marital Status: Married How Long?: n/s Patient Roles: Spouse, Parent Spouse/Significant Other: Memory Dance Children: Marylene Land Houghm Daughter Other Supports: N/A Anticipated Caregiver: Spouse and Daughter Ability/Limitations of Caregiver: Wife works during the day, daughter [facility] Caregiver Availability: 24/7 Family Dynamics: support from family  Social History Preferred language: English Religion: Holiness Cultural Background: Supportive family. Patient independent overall, working and driving. Enjoys fishing and boating Education: [employer] Literacy - How often do you need to have someone help you when you read instructions, pamphlets, or other written material from your doctor or pharmacy?: Never Writes: Yes Employment Status: Employed Name of Employer: PT at [employer] Return to Work Plans: tbd Marine scientist Issues: n/a Guardian/Conservator: n/a   Abuse/Neglect Abuse/Neglect Assessment Can Be Completed: Yes Physical Abuse: Denies Verbal Abuse: Denies Sexual Abuse: Denies Exploitation of patient/patient's resources: Denies Self-Neglect: Denies  Patient response to: Social Isolation - How often do you feel lonely or isolated from those around you?: Never  Emotional Status Pt's affect, behavior and adjustment status: Pleasant, prefers lower lighting Recent Psychosocial Issues: Coping Psychiatric History: N/a Substance Abuse History: n/a  Patient / Family Perceptions, Expectations & Goals Pt/Family understanding of illness & functional  limitations: yes Premorbid pt/family roles/activities: Independent overall and working Anticipated changes in roles/activities/participation: patient will have supervision from spouse and daughter at d/c Pt/family expectations/goals: Sup/Mod I  Games developer: None Premorbid Home Care/DME Agencies: Other (Comment) Adult nurse) Transportation available at discharge: spouse or daughter Is the patient able to respond to transportation needs?: Yes In the past 12 months, has lack of transportation kept you from medical appointments or from getting medications?: No In the past 12 months, has lack of transportation kept you from meetings, work, or from getting things needed for daily living?: No Resource referrals recommended: Neuropsychology  Discharge Planning Living Arrangements: Spouse/significant other, Children Support Systems: Spouse/significant other, Children Type of Residence: Private residence Insurance Resources: Media planner (specify) ([employer] Medicare) Financial Resources: Employment, Restaurant manager, fast food Screen Referred: No Living Expenses: Banker Management: Patient, Spouse Does the patient have any problems obtaining your medications?: No Home Management: Independent Patient/Family Preliminary Plans: Plans to remain independent. Family able to assist if needed. Care Coordinator Barriers to Discharge: Insurance for SNF coverage Care Coordinator Anticipated Follow Up Needs: HH/OP Expected length of stay: 5-7 Days  Clinical Impression SW met with patient, introduced self and explained role. Patient anticipates discharging back home with his spouse and daughter to assist. Spouse works during the day but daughter [facility]. Patient has a RW. No additional questions or concerns currently. SW contact information left in room.   Andria Rhein 10/13/2022, 1:21 PM

## 2022-10-13 NOTE — Plan of Care (Signed)
  Problem: RH Balance Goal: LTG Patient will maintain dynamic standing with ADLs (OT) Description: LTG:  Patient will maintain dynamic standing balance with assist during activities of daily living (OT)  Flowsheets (Taken 10/13/2022 1619) LTG: Pt will maintain dynamic standing balance during ADLs with: Independent with assistive device   Problem: Sit to Stand Goal: LTG:  Patient will perform sit to stand in prep for activites of daily living with assistance level (OT) Description: LTG:  Patient will perform sit to stand in prep for activites of daily living with assistance level (OT) Flowsheets (Taken 10/13/2022 1619) LTG: PT will perform sit to stand in prep for activites of daily living with assistance level: Independent with assistive device   Problem: RH Bathing Goal: LTG Patient will bathe all body parts with assist levels (OT) Description: LTG: Patient will bathe all body parts with assist levels (OT) Flowsheets (Taken 10/13/2022 1619) LTG: Pt will perform bathing with assistance level/cueing: Independent with assistive device    Problem: RH Dressing Goal: LTG Patient will perform upper body dressing (OT) Description: LTG Patient will perform upper body dressing with assist, with/without cues (OT). Flowsheets (Taken 10/13/2022 1619) LTG: Pt will perform upper body dressing with assistance level of: Independent Goal: LTG Patient will perform lower body dressing w/assist (OT) Description: LTG: Patient will perform lower body dressing with assist, with/without cues in positioning using equipment (OT) Flowsheets (Taken 10/13/2022 1619) LTG: Pt will perform lower body dressing with assistance level of: Independent with assistive device   Problem: RH Toileting Goal: LTG Patient will perform toileting task (3/3 steps) with assistance level (OT) Description: LTG: Patient will perform toileting task (3/3 steps) with assistance level (OT)  Flowsheets (Taken 10/13/2022 1619) LTG: Pt will perform  toileting task (3/3 steps) with assistance level: Independent with assistive device   Problem: RH Simple Meal Prep Goal: LTG Patient will perform simple meal prep w/assist (OT) Description: LTG: Patient will perform simple meal prep with assistance, with/without cues (OT). Flowsheets (Taken 10/13/2022 1619) LTG: Pt will perform simple meal prep with assistance level of: Set up assist   Problem: RH Toilet Transfers Goal: LTG Patient will perform toilet transfers w/assist (OT) Description: LTG: Patient will perform toilet transfers with assist, with/without cues using equipment (OT) Flowsheets (Taken 10/13/2022 1619) LTG: Pt will perform toilet transfers with assistance level of: Independent with assistive device   Problem: RH Tub/Shower Transfers Goal: LTG Patient will perform tub/shower transfers w/assist (OT) Description: LTG: Patient will perform tub/shower transfers with assist, with/without cues using equipment (OT) Flowsheets (Taken 10/13/2022 1619) LTG: Pt will perform tub/shower stall transfers with assistance level of: Independent with assistive device

## 2022-10-13 NOTE — Progress Notes (Signed)
Inpatient Rehabilitation  Patient information reviewed and entered into eRehab system by Melissa M. Bowie, M.A., CCC/SLP, PPS Coordinator.  Information including medical coding, functional ability and quality indicators will be reviewed and updated through discharge.    

## 2022-10-13 NOTE — Evaluation (Signed)
Occupational Therapy Assessment and Plan  Patient Details  Name: Eric Dickerson MRN: [id number] Date of Birth: 05-28-1950  OT Diagnosis: ataxia and muscle weakness (generalized) Rehab Potential: Rehab Potential (ACUTE ONLY): Good ELOS: ~10 days  Today's Date: 10/13/2022 OT Individual Time: 1300-1415 OT Individual Time Calculation (min): 75 min     Hospital Problem: Principal Problem:   CVA (cerebral vascular accident) [facility])   Past Medical History:  Past Medical History:  Diagnosis Date   ANEMIA, MILD    CAD (coronary artery disease)    a. CAD,  b. s/p Cypher DES to the ramus and Promus DES x 2 to the RCA 08/2007 (minimal disease in LAD and CFX at that time), c. Myoview 8/10: EF 63%, inf thinning, small prior ant infarct, no ischemia;  d. s/p CABG 3/14 (L-LAD, RIMA-OM1)   DEGENERATIVE JOINT DISEASE, RIGHT HIP    DIVERTICULOSIS, COLON    ERECTILE DYSFUNCTION    GERD    Heart murmur    HIATAL HERNIA    HTN (hypertension)    Hyperlipidemia    statin intolerant   HYPERPLASIA PROSTATE UNS W/O UR OBST & OTH LUTS    LUMBAR RADICULOPATHY, RIGHT    Other abnormal glucose    PAD (peripheral artery disease) (HCC)    pre CABG 04/2012 => ABIs: Right 0.92, left 0.61 // ABI 09/02/14: R 0.93; L 0.82   SPINAL STENOSIS, LUMBAR    Past Surgical History:  Past Surgical History:  Procedure Laterality Date   COLONOSCOPY  02/07/2000   negative   COLONOSCOPY  08/2017   TA, Dr Myrtie Neither   CORONARY ARTERY BYPASS GRAFT N/A 04/22/2012   Procedure: CORONARY ARTERY BYPASS GRAFTING (CABG);  Surgeon: Loreli Slot, MD;  Location: [facility] OR;  Service: Open Heart Surgery;  Laterality: N/A;  CABG X 2, BIMA, POSSIBLE EVH   CORONARY STENT PLACEMENT  02/07/2008    X 2   TEE WITHOUT CARDIOVERSION N/A 04/22/2012   Procedure: TRANSESOPHAGEAL ECHOCARDIOGRAM (TEE);  Surgeon: Loreli Slot, MD;  Location: [facility] OR;  Service: Open Heart Surgery;  Laterality: N/A;   TOTAL HIP ARTHROPLASTY Right 02/07/2007   UPPER  GASTROINTESTINAL ENDOSCOPY      Assessment & Plan Clinical Impression: Patient is a 72 y.o. year old male who presented to the ED with sudden onset of dizziness and gait instability as well as feeling of falling if he stood up with room spinning. Seen by neurology. His last known well was somewhere around 10 AM-he was outside the window for IV TNKase by the time neurological consultation was obtained. MRI showed cerebellar infarcts. Given his prior cardiac history, atheroembolic versus cardioembolic source of stroke is suspected-further workup needed. Plavix started. Intolerant of statins and aspirin.  Has mild left UE and LE dysmetria on exam but significant imbalance on walking with PT/OT.  CT of neck performed with  left V4 stenotic plaque versus dissection, right P2, right M1, bilateral siphon stenosis, right VA origin severe stenosis. Added aspirin and recommend DAPT for 3 months given left VA stenosis followed by aspirin alone. Zetia three times weekly.  Patient had hypokalemia that was repleted. Patient transferred to CIR on 10/12/2022 .    Patient currently requires min Awith basic self-care skills and functional mobility   secondary to muscle weakness, decreased cardiorespiratoy endurance, ataxia, and decreased standing balance and decreased balance strategies.  Prior to hospitalization, patient could complete ADL with independent .  Patient will benefit from skilled intervention to decrease level of assist with basic self-care  skills and increase independence with basic self-care skills prior to discharge home with care partner.  Anticipate patient will require intermittent supervision and follow up outpatient.  OT - End of Session Activity Tolerance: Tolerates 30+ min activity with multiple rests Endurance Deficit: Yes Endurance Deficit Description: required breaks due to dizziness OT Assessment Rehab Potential (ACUTE ONLY): Good OT Patient demonstrates impairments in the following area(s):  Balance;Endurance;Motor OT Basic ADL's Functional Problem(s): Bathing;Dressing;Toileting OT Transfers Functional Problem(s): Toilet;Tub/Shower OT Additional Impairment(s): None OT Plan OT Intensity: Minimum of 1-2 x/day, 45 to 90 minutes OT Frequency: 5 out of 7 days OT Duration/Estimated Length of Stay: ~10 OT Treatment/Interventions: Balance/vestibular training;Disease mangement/prevention;Neuromuscular re-education;Self Care/advanced ADL retraining;Therapeutic Exercise;DME/adaptive equipment instruction;Pain management;Skin care/wound managment;UE/LE Strength taining/ROM;Community reintegration;Patient/family education;UE/LE Coordination activities;Discharge planning;Functional mobility training;Psychosocial support;Therapeutic Activities;Visual/perceptual remediation/compensation OT Self Feeding Anticipated Outcome(s): n/a OT Basic Self-Care Anticipated Outcome(s): mod I OT Toileting Anticipated Outcome(s): mod I OT Bathroom Transfers Anticipated Outcome(s): mod I OT Recommendation Recommendations for Other Services: Vestibular eval Patient destination: Home Follow Up Recommendations: Outpatient OT Equipment Recommended: To be determined   OT Evaluation Precautions/Restrictions  Precautions Precautions: Fall Restrictions Weight Bearing Restrictions: No General Chart Reviewed: Yes Family/Caregiver Present: No Vital Signs Therapy Vitals Temp: 98.3 F (36.8 C) Pulse Rate: 71 Resp: 18 BP: 135/87 Patient Position (if appropriate): Lying Oxygen Therapy SpO2: 98 % O2 Device: Room Air Pain   Home Living/Prior Functioning Home Living Family/patient expects to be discharged to:: Private residence Living Arrangements: Spouse/significant other, Children Available Help at Discharge: Family, Available PRN/intermittently Type of Home: House Home Access: Stairs to enter Secretary/administrator of Steps: small threshold Home Layout: One level Bathroom Shower/Tub: Architectural technologist: Standard Bathroom Accessibility: Yes Additional Comments: has RW from hip replacement  Lives With: Spouse Prior Function Level of Independence: Independent with basic ADLs, Independent with transfers, Independent with homemaking with ambulation, Independent with gait  Able to Take Stairs?: Yes Driving: Yes Vocation: Part time employment Vocation Requirements: warehose Oncologist Baseline Vision/History: 1 Wears glasses Ability to See in Adequate Light: 1 Impaired Patient Visual Report: Blurring of vision Vision Assessment?: Yes Ocular Range of Motion: Within Functional Limits Alignment/Gaze Preference: Within Defined Limits Perception  Perception: Within Functional Limits Praxis Praxis: WFL Cognition Cognition Overall Cognitive Status: Within Functional Limits for tasks assessed Arousal/Alertness: Awake/alert Orientation Level: Person;Place;Situation Person: Oriented Place: Oriented Memory: Appears intact Attention: Selective Sustained Attention: Appears intact Selective Attention: Appears intact Awareness: Appears intact Problem Solving: Appears intact Reasoning: Appears intact Self Monitoring: Appears intact Self Correcting: Appears intact Safety/Judgment: Appears intact Brief Interview for Mental Status (BIMS) Repetition of Three Words (First Attempt): 3 Temporal Orientation: Year: Correct Temporal Orientation: Month: Accurate within 5 days Temporal Orientation: Day: Correct Recall: "Sock": No, could not recall Recall: "Blue": No, could not recall Recall: "Bed": No, could not recall BIMS Summary Score: 9 Sensation Sensation Light Touch: Appears Intact Proprioception: Impaired Detail Proprioception Impaired Details: Impaired LLE Coordination Gross Motor Movements are Fluid and Coordinated: No Fine Motor Movements are Fluid and Coordinated: Yes Coordination and Movement Description: grossly uncoordinated due to hemiparesis,  ataxia, decreased balance/coordination, and visual impairments Finger Nose Finger Test: [facility] but slow Motor  Motor Motor: Hemiplegia;Ataxia Motor - Skilled Clinical Observations: hemiparesis, ataxia, decreased balance/coordination, and visual impairments  Trunk/Postural Assessment  Cervical Assessment Cervical Assessment: Within Functional Limits Thoracic Assessment Thoracic Assessment: Within Functional Limits Lumbar Assessment Lumbar Assessment: Within Functional Limits Postural Control Righting Reactions: delayed and inadequate Protective Responses: delayed and inadequate  Balance Balance Balance  Assessed: Yes Static Sitting Balance Static Sitting - Balance Support: Feet supported;No upper extremity supported Static Sitting - Level of Assistance: 6: Modified independent (Device/Increase time) Dynamic Sitting Balance Dynamic Sitting - Balance Support: Feet supported;No upper extremity supported Dynamic Sitting - Level of Assistance: 5: Stand by assistance Static Standing Balance Static Standing - Balance Support: No upper extremity supported;During functional activity Static Standing - Level of Assistance: 4: Min assist Dynamic Standing Balance Dynamic Standing - Balance Support: No upper extremity supported;During functional activity Dynamic Standing - Level of Assistance: 3: Mod assist Dynamic Standing - Comments: with dynamic gait Extremity/Trunk Assessment RUE Assessment RUE Assessment: Within Functional Limits LUE Assessment LUE Assessment: Within Functional Limits General Strength Comments: slightly weaker than the right  Care Tool Care Tool Self Care Eating   Eating Assist Level: Set up assist    Oral Care    Oral Care Assist Level: Set up assist    Bathing   Body parts bathed by patient: Right arm;Left arm;Chest;Abdomen;Front perineal area;Buttocks;Right upper leg;Left upper leg;Right lower leg;Left lower leg;Face     Assist Level: Minimal Assistance -  Patient > 75%    Upper Body Dressing(including orthotics)   What is the patient wearing?: Pull over shirt   Assist Level: Supervision/Verbal cueing    Lower Body Dressing (excluding footwear)   What is the patient wearing?: Incontinence brief;Pants Assist for lower body dressing: Moderate Assistance - Patient 50 - 74%    Putting on/Taking off footwear   What is the patient wearing?: Socks Assist for footwear: Contact Guard/Touching assist       Care Tool Toileting Toileting activity   Assist for toileting: Contact Guard/Touching assist     Care Tool Bed Mobility Roll left and right activity   Roll left and right assist level: Set up assist    Sit to lying activity        Lying to sitting on side of bed activity         Care Tool Transfers Sit to stand transfer   Sit to stand assist level: Minimal Assistance - Patient > 75%    Chair/bed transfer   Chair/bed transfer assist level: Minimal Assistance - Patient > 75%     Toilet transfer   Assist Level: Minimal Assistance - Patient > 75%     Care Tool Cognition  Expression of Ideas and Wants Expression of Ideas and Wants: 4. Without difficulty (complex and basic) - expresses complex messages without difficulty and with speech that is clear and easy to understand  Understanding Verbal and Non-Verbal Content Understanding Verbal and Non-Verbal Content: 4. Understands (complex and basic) - clear comprehension without cues or repetitions   Memory/Recall Ability Memory/Recall Ability : Current season;Location of own room;Staff names and faces;That he or she is in a hospital/hospital unit   Refer to Care Plan for Long Term Goals  SHORT TERM GOAL WEEK 1 OT Short Term Goal 1 (Week 1): Pt able to stand with supervision to perform 2 grooming tasks OT Short Term Goal 2 (Week 1): Pt perform transfer on/ off toilet or void in standing wtih supervision OT Short Term Goal 3 (Week 1): Pt would be able to stand for showering with  supervision  Recommendations for other services: None    Skilled Therapeutic Intervention 1:1 Ot eval initiated with OT purpose role and goals discussed. Pt ambulated around the room without AD with min A. Pt showered sit to stand- see below for performance. Pt ambulated from room to the gym with  min A. When pt turned head to look at another staff member while walking straight pt lost his balance and required mod A to recover. In the gym focus on dynamic balance and movement. Performed toe taps on block and then stepping up on 4 inch step, navigated forwards, backwards, and sideways through agility ladder. Pt also performed bouncing a 2 lb ball with the rebounder while doing sit to stands with supervision non stop for 1.5 min. After seated rest break ambulated in the lite gait backwards (without UE support) with slack in the harness support (as a fall safety net) in the hallway ~ 550 feet. Focus on controlled speed and ability to correct LOB. Pt very fatigued when arrived back in gym.  After resting pt ambulated back to room with therapist and tech on either side without holding on to pt with pt having no LOB that needed assisted to correct. Pt making improvements daily with his mobility!! Left sitting up in recliner on chair pad with call bell.    ADL ADL Eating: Set up Upper Body Bathing: Contact guard Where Assessed-Upper Body Bathing: Shower Lower Body Bathing: Minimal assistance Where Assessed-Lower Body Bathing: Shower Upper Body Dressing: Supervision/safety Where Assessed-Upper Body Dressing: Edge of bed Lower Body Dressing: Moderate assistance Where Assessed-Lower Body Dressing: Edge of bed Toileting: Minimal assistance Where Assessed-Toileting: Teacher, adult education: Curator Method: Event organiser: Insurance underwriter Method: Ambulating Mobility  Transfers Sit to Stand: Minimal Assistance - Patient > 75% Stand  to Sit: Minimal Assistance - Patient > 75%   Discharge Criteria: Patient will be discharged from OT if patient refuses treatment 3 consecutive times without medical reason, if treatment goals not met, if there is a change in medical status, if patient makes no progress towards goals or if patient is discharged from hospital.  The above assessment, treatment plan, treatment alternatives and goals were discussed and mutually agreed upon: by patient  Adan Sis 10/13/2022, 4:10 PM

## 2022-10-13 NOTE — Discharge Summary (Signed)
Physician Discharge Summary  Patient ID: Eric Dickerson MRN: [id number] DOB/AGE: 08/27/1950 71 y.o.  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 Urinary retention Hyperlipidemia History of coronary artery disease Borderline prediabetes Hypokalemia Leukocytosis Hypertension Azotemia  Discharged Condition: good  Significant Diagnostic Studies:   Lower Venous DVT Study  Patient Name:  Eric Dickerson  Date of Exam:   10/13/2022 Medical Rec #: [id number]      Accession #:    [id number] Date of Birth: 06-27-1950       Patient Gender: M Patient Age:   44 years Exam Location:  [facility] Procedure:      VAS [place] LOWER EXTREMITY VENOUS (DVT) Referring Phys: Wendi Maya   --------------------------------------------------------------------------- -----   Indications: "edema". Other Indications: Rehab patient.  Comparison Study: No previous exams  Performing Technologist: Jody Hill RVT, RDMS    Examination Guidelines: A complete evaluation includes B-mode imaging, spectral Doppler, color Doppler, and power Doppler as needed of all accessible portions of each vessel. Bilateral testing is considered an integral part of a complete examination. Limited examinations for reoccurring indications may be performed as noted. The reflux portion of the exam is performed with the patient in reverse Trendelenburg.     +---------+---------------+---------+-----------+----------+--------------+  RIGHT    CompressibilityPhasicitySpontaneityPropertiesThrombus Aging +---------+---------------+---------+-----------+----------+--------------+  CFV      Full           Yes      Yes                                   +---------+---------------+---------+-----------+----------+--------------+  SFJ      Full                                                         +---------+---------------+---------+-----------+----------+--------------+  FV Prox  Full           Yes      Yes                                  +---------+---------------+---------+-----------+----------+--------------+  FV Mid   Full           Yes      Yes                                  +---------+---------------+---------+-----------+----------+--------------+  FV DistalFull           Yes      Yes                                  +---------+---------------+---------+-----------+----------+--------------+  PFV      Full                                                         +---------+---------------+---------+-----------+----------+--------------+  POP      Full           Yes      Yes                                  +---------+---------------+---------+-----------+----------+--------------+  PTV      Full                                                         +---------+---------------+---------+-----------+----------+--------------+  PERO     Full                                                         +---------+---------------+---------+-----------+----------+--------------+         +---------+---------------+---------+-----------+----------+--------------+  LEFT     CompressibilityPhasicitySpontaneityPropertiesThrombus Aging +---------+---------------+---------+-----------+----------+--------------+  CFV      Full           Yes      Yes                                  +---------+---------------+---------+-----------+----------+--------------+  SFJ      Full                                                         +---------+---------------+---------+-----------+----------+--------------+  FV Prox  Full           Yes       Yes                                  +---------+---------------+---------+-----------+----------+--------------+  FV Mid   Full           Yes      Yes                                  +---------+---------------+---------+-----------+----------+--------------+  FV DistalFull           Yes      Yes                                  +---------+---------------+---------+-----------+----------+--------------+  PFV      Full                                                         +---------+---------------+---------+-----------+----------+--------------+  POP      Full           Yes      Yes                                  +---------+---------------+---------+-----------+----------+--------------+  PTV      Full                                                         +---------+---------------+---------+-----------+----------+--------------+  PERO     Full                                                         +---------+---------------+---------+-----------+----------+--------------+   Summary: BILATERAL: - No evidence of deep vein thrombosis seen in the lower extremities, bilaterally. -No evidence of popliteal cyst, bilaterally.   *See table(s) above for measurements and observations.  Electronically signed by Heath Lark on 10/15/2022 at 11:07:59 AM.     Final        Labs:  Basic Metabolic Panel:    Latest Ref Rng & Units 10/16/2022    7:03 AM 10/15/2022    5:08 AM 10/13/2022    6:12 AM  BMP  Glucose 70 - 99 mg/dL 161  096  045   BUN 8 - 23 mg/dL 19  25  21    Creatinine 0.61 - 1.24 mg/dL 4.09  8.11  9.14   Sodium 135 - 145 mmol/L 138  137  138   Potassium 3.5 - 5.1 mmol/L 4.0  4.2  4.4   Chloride 98 - 111 mmol/L 102  105  102   CO2 22 - 32 mmol/L 25  25  25    Calcium 8.9 - 10.3 mg/dL 9.5  9.4  78.2       CBC:    Latest Ref Rng & Units 10/16/2022    7:03 AM 10/13/2022    6:12 AM 10/10/2022   11:40 AM  CBC  WBC 4.0 -  10.5 K/uL 8.2  10.4  10.6   Hemoglobin 13.0 - 17.0 g/dL 95.6  21.3  08.6   Hematocrit 39.0 - 52.0 % 42.4  40.2  38.5   Platelets 150 - 400 K/uL 293  256  267     CBG: No results for input(s): "GLUCAP" in the last 168 hours.   Brief HPI:   Eric Dickerson is a 72 y.o. male  who presented to the ED with sudden onset of dizziness and gait instability as well as feeling of falling if he stood up with room spinning. Seen by neurology. His last known well was somewhere around 10 AM-he was outside the window for IV TNKase by the time neurological consultation was obtained. MRI showed cerebellar infarcts. Given his prior cardiac history, atheroembolic versus cardioembolic source of stroke is suspected-further workup needed. Plavix started. Intolerant of statins and aspirin.  Has mild left UE and LE dysmetria on exam but significant imbalance on walking with PT/OT.  CT of neck performed with  left V4 stenotic plaque versus dissection, right P2, right M1, bilateral siphon stenosis, right VA origin severe stenosis. Added aspirin and recommend DAPT for 3 months given left VA stenosis followed by aspirin alone. Zetia three times weekly.  Patient had hypokalemia that was repleted.   He states he has had urinary urgency and frequency for some time and is taking tamulosin. Also,reports waxing and waning left hearing acuity some of which he attributes  to cerumen build up. Tolerating diet. Says he felt constipated after hospital admission and had small formed stool this morning. Given Miralax. Normally has 1-2 BMs per day.   Hospital Course: Eric Dickerson was admitted to rehab 10/12/2022 for inpatient therapies to consist of PT, ST and OT at least three hours five days a week. Past admission physiatrist, therapy team and rehab RN have worked together to provide customized collaborative inpatient rehab. Follow-up blood work 9/06 with increasing creatinine from 1.14-1.36.  P.o. fluid intake encouraged.  Rechecked again 9/08  and was 1.31. Bilateral lower extremity venous duplex negative for DVT.  Ambulated 425 feet 9/07.  Pressure slightly elevated 9/10 and the patient is now 7 days post CVA and amlodipine 5 mg daily restarted.  Follow-up BMP 9/09 with normal BUN and creatinine.  Blood pressures were monitored on TID basis and home amlodipine held. Amlodipine 5 mg daily restarted on 9/09.   Rehab course: During patient's stay in rehab weekly team conferences were held to monitor patient's progress, set goals and discuss barriers to discharge. At admission, patient required min/mod A with mobility and min Awith basic self-care skills   He has had improvement in activity tolerance, balance, postural control as well as ability to compensate for deficits. He has had improvement in functional use RUE/LUE  and RLE/LLE as well as improvement in awareness  Disposition: home There are no questions and answers to display.      Diet: Heart healthy  Special Instructions: No driving, alcohol consumption or tobacco use.  30-35 minutes were spent on discharge planning and discharge summary.  Discharge Instructions     Ambulatory referral to Neurology   Complete by: As directed    An appointment is requested in approximately: 4 weeks   Ambulatory referral to Physical Medicine Rehab   Complete by: As directed    Hospital follow-up     Aspirin and Plavix for three months followed by aspirin alone.   30-35 minutes were spent on discharge planning and discharge summary. Allergies as of 10/20/2022       Reactions   Accupril [quinapril Hcl] Cough   Ace Inhibitors Other (See Comments)   ACE inhibitors are contraindicated because of a history of rash with Angiotensin receptor blocker.   Calan [verapamil] Other (See Comments)   Sun sensitivity   Crestor [rosuvastatin] Other (See Comments)   Myalgias Cramps   Glucosamine Other (See Comments)   Unknown reaction   Lipitor [atorvastatin] Other (See Comments)   Myalgias   Cramps   Livalo [pitavastatin] Other (See Comments)   Cramps   Pravachol [pravastatin] Other (See Comments)   Myalgias Cramps   Statins Other (See Comments)   Myalgias, cramping with: Crestor, Lipitor, Livalo, Pravachol.   Zetia [ezetimibe] Other (See Comments)   Myalgias Cramps Pt ok on 3x weekly dose   Benicar [olmesartan] Rash        Medication List     STOP taking these medications    acetaminophen 500 MG tablet Commonly known as: TYLENOL   feeding supplement Liqd       TAKE these medications    amLODipine 5 MG tablet Commonly known as: NORVASC Take 1 tablet (5 mg total) by mouth daily.   aspirin EC 81 MG tablet Take 1 tablet (81 mg total) by mouth daily. Swallow whole.   Centrum Silver 50+Men Tabs Take 1 tablet by mouth daily.   clopidogrel 75 MG tablet Commonly known as: PLAVIX Take 1 tablet (75 mg total) by mouth  daily.   ezetimibe 10 MG tablet Commonly known as: ZETIA Take 10 mg by mouth 3 (three) times a week.   meclizine 12.5 MG tablet Commonly known as: ANTIVERT Take 1 tablet (12.5 mg total) by mouth 3 (three) times daily as needed for dizziness.   nitroGLYCERIN 0.4 MG SL tablet Commonly known as: NITROSTAT Take 1 tab under your tongue for chest pain  If no relief of pain may repeat NTG, one tab every 5 minutes up to 3 tablets total over 15 minutes.   tamsulosin 0.4 MG Caps capsule Commonly known as: FLOMAX Take 0.4 mg by mouth at bedtime.        Follow-up Information     Kirsteins, Victorino Sparrow, MD Follow up.   Specialty: Physical Medicine and Rehabilitation Why: office will call you to arrange your appt (sent) Contact information: [address] [place] [address] [phone]         [name], MD Follow up.   Specialties: Internal Medicine, Radiology Why: Call the office in 1 to 2 days to make arrangements for hospital follow-up appointment. Contact information: [address] [place]  [phone]         [facility] Follow up.   Why: Is in 1 to 2 days to make arrangements for hospital follow-up appointment. Contact information: [address]     [address] [place] [address] [phone]        [name], MD Follow up.   Specialty: Cardiology Contact information: [address] [place] [address] [phone]                 Signed: Milinda Antis 10/24/2022, 1:07 PM

## 2022-10-13 NOTE — Progress Notes (Signed)
BLE venous duplex has been completed.    Results can be found under chart review under CV PROC. 10/13/2022 5:06 PM Gail Creekmore RVT, RDMS

## 2022-10-14 DIAGNOSIS — R7989 Other specified abnormal findings of blood chemistry: Secondary | ICD-10-CM

## 2022-10-14 DIAGNOSIS — E876 Hypokalemia: Secondary | ICD-10-CM | POA: Diagnosis not present

## 2022-10-14 DIAGNOSIS — R42 Dizziness and giddiness: Secondary | ICD-10-CM | POA: Diagnosis not present

## 2022-10-14 DIAGNOSIS — I63542 Cerebral infarction due to unspecified occlusion or stenosis of left cerebellar artery: Secondary | ICD-10-CM | POA: Diagnosis not present

## 2022-10-14 NOTE — Progress Notes (Signed)
Occupational Therapy Session Note  Patient Details  Name: Eric Dickerson MRN: [id number] Date of Birth: [birth year]  Today's Date: 10/14/2022 OT Individual Time: 4098-1191 OT Individual Time Calculation (min): 75 min    Short Term Goals: Week 1:  OT Short Term Goal 1 (Week 1): Pt able to stand with supervision to perform 2 grooming tasks OT Short Term Goal 2 (Week 1): Pt perform transfer on/ off toilet or void in standing wtih supervision OT Short Term Goal 3 (Week 1): Pt would be able to stand for showering with supervision  Skilled Therapeutic Interventions/Progress Updates:     Patient seen this AM for skilled OT, pt resting in supine at the time of arrival.  Patient indicated that he rested okay and he had no pain response at the time of treatment.  The pt was able to come from supine to EOB with SBA.The pt was able to transfer from EOB to w/c using the RW with CGA.  The pt was transported to the restroom incorporating his feet with MinA.  The pt was able to come from sit to stand using the grab bar for standing to void.  The pt was transported from the restroom to the sink area and was able to doff his over head shirt with SBA.  The pt was able to come from sit to stand to wash his UB/LB with close S. The pt was able to donn his over head shirt with s/u assist and he was was able to donn his briefs and shorts with close S. The pt donned his sneakers with s/u assist.   Following performance of BADL related task, the pt rolled himself to the gym at w/c LOF > 20 ft  incorporating his feet. The pt was then positioned on the SCIFIT for 15 mins with rest breaks as needed, the pt required 1 rest break.     The pt went on to create a pattern using the pegs board while  in standing with 2lb wrist weight in place to improve his  static and dynamic standing balance. At the end of the treatment session, the pt was able to use his feet to ambulate to the room at w/c LOF and was able to transfer from w/c LOF to  the recliner with CGA with his call light and bedside table within reach  all additional needs addressed.  Nursing arrived prior to me exiting the room with morning meds.  The pt was very pleasant with no report of pain at the time of treatment.   Therapy Documentation Precautions:  Precautions Precautions: Fall Restrictions Weight Bearing Restrictions: No Other Treatments:     Therapy/Group: Individual Therapy  Lavona Mound 10/14/2022, 3:48 PM

## 2022-10-14 NOTE — Progress Notes (Signed)
PROGRESS NOTE   Subjective/Complaints:  No acute events noted overnight.  Patient feels a little dizzy at times when up.  No additional concerns or complaints.  ROS- neg CP, SOB, N/V/D, new changes in motor or sensory function + Intermittent dizziness  Objective:   VAS [place] LOWER EXTREMITY VENOUS (DVT)  Result Date: 10/13/2022  Lower Venous DVT Study Patient Name:  Eric Dickerson  Date of Exam:   10/13/2022 Medical Rec #: [id number]      Accession #:    [id number] Date of Birth: 07/07/1950       Patient Gender: M Patient Age:   72 years Exam Location:  [facility] Procedure:      VAS [place] LOWER EXTREMITY VENOUS (DVT) Referring Phys: Wendi Maya --------------------------------------------------------------------------------  Indications: "edema". Other Indications: Rehab patient. Comparison Study: No previous exams Performing Technologist: Jody Hill RVT, RDMS  Examination Guidelines: A complete evaluation includes B-mode imaging, spectral Doppler, color Doppler, and power Doppler as needed of all accessible portions of each vessel. Bilateral testing is considered an integral part of a complete examination. Limited examinations for reoccurring indications may be performed as noted. The reflux portion of the exam is performed with the patient in reverse Trendelenburg.  +---------+---------------+---------+-----------+----------+--------------+ RIGHT    CompressibilityPhasicitySpontaneityPropertiesThrombus Aging +---------+---------------+---------+-----------+----------+--------------+ CFV      Full           Yes      Yes                                 +---------+---------------+---------+-----------+----------+--------------+ SFJ      Full                                                        +---------+---------------+---------+-----------+----------+--------------+ FV Prox  Full           Yes      Yes                                  +---------+---------------+---------+-----------+----------+--------------+ FV Mid   Full           Yes      Yes                                 +---------+---------------+---------+-----------+----------+--------------+ FV DistalFull           Yes      Yes                                 +---------+---------------+---------+-----------+----------+--------------+ PFV      Full                                                        +---------+---------------+---------+-----------+----------+--------------+  POP      Full           Yes      Yes                                 +---------+---------------+---------+-----------+----------+--------------+ PTV      Full                                                        +---------+---------------+---------+-----------+----------+--------------+ PERO     Full                                                        +---------+---------------+---------+-----------+----------+--------------+   +---------+---------------+---------+-----------+----------+--------------+ LEFT     CompressibilityPhasicitySpontaneityPropertiesThrombus Aging +---------+---------------+---------+-----------+----------+--------------+ CFV      Full           Yes      Yes                                 +---------+---------------+---------+-----------+----------+--------------+ SFJ      Full                                                        +---------+---------------+---------+-----------+----------+--------------+ FV Prox  Full           Yes      Yes                                 +---------+---------------+---------+-----------+----------+--------------+ FV Mid   Full           Yes      Yes                                 +---------+---------------+---------+-----------+----------+--------------+ FV DistalFull           Yes      Yes                                  +---------+---------------+---------+-----------+----------+--------------+ PFV      Full                                                        +---------+---------------+---------+-----------+----------+--------------+ POP      Full           Yes      Yes                                 +---------+---------------+---------+-----------+----------+--------------+ PTV  Full                                                        +---------+---------------+---------+-----------+----------+--------------+ PERO     Full                                                        +---------+---------------+---------+-----------+----------+--------------+     Summary: BILATERAL: - No evidence of deep vein thrombosis seen in the lower extremities, bilaterally. -No evidence of popliteal cyst, bilaterally.   *See table(s) above for measurements and observations.    Preliminary    Recent Labs    10/13/22 0612  WBC 10.4  HGB 12.7*  HCT 40.2  PLT 256   Recent Labs    10/12/22 0910 10/13/22 0612  NA 137 138  K 3.4* 4.4  CL 103 102  CO2 25 25  GLUCOSE 103* 100*  BUN 17 21  CREATININE 1.14 1.36*  CALCIUM 9.5 10.0    Intake/Output Summary (Last 24 hours) at 10/14/2022 1415 Last data filed at 10/14/2022 1241 Gross per 24 hour  Intake 838 ml  Output 500 ml  Net 338 ml        Physical Exam: Vital Signs Blood pressure (!) 133/94, pulse 90, temperature 98.2 F (36.8 C), resp. rate 17, height 6\' 2"  (1.88 m), weight 80.4 kg, SpO2 100%.   General: No acute distress, lying in bed appears comfortable Mood and affect are appropriate Heart: Regular rate and rhythm no rubs murmurs or extra sounds Lungs: CTAB, good air movement Abdomen: Positive bowel sounds, soft nontender to palpation, nondistended Extremities: No clubbing, cyanosis, or edema Skin: No evidence of breakdown, no evidence of rash Neurologic: Cranial nerves II through XII intact, motor strength is 5/5 in bilateral  deltoid, bicep, tricep, grip, hip flexor, knee extensors, ankle dorsiflexor and plantar flexor Sensory exam normal sensation to light touch and proprioception in bilateral upper and lower extremities Musculoskeletal: Full range of motion in all 4 extremities. No joint swelling   Assessment/Plan: 1. Functional deficits which require 3+ hours per day of interdisciplinary therapy in a comprehensive inpatient rehab setting. Physiatrist is providing close team supervision and 24 hour management of active medical problems listed below. Physiatrist and rehab team continue to assess barriers to discharge/monitor patient progress toward functional and medical goals  Care Tool:  Bathing    Body parts bathed by patient: Right arm, Left arm, Chest, Abdomen, Front perineal area, Buttocks, Right upper leg, Left upper leg, Right lower leg, Left lower leg, Face         Bathing assist Assist Level: Minimal Assistance - Patient > 75%     Upper Body Dressing/Undressing Upper body dressing   What is the patient wearing?: Pull over shirt    Upper body assist Assist Level: Supervision/Verbal cueing    Lower Body Dressing/Undressing Lower body dressing      What is the patient wearing?: Incontinence brief, Pants     Lower body assist Assist for lower body dressing: Moderate Assistance - Patient 50 - 74%     Toileting Toileting    Toileting assist Assist for toileting: Contact Guard/Touching assist  Transfers Chair/bed transfer  Transfers assist     Chair/bed transfer assist level: Minimal Assistance - Patient > 75%     Locomotion Ambulation   Ambulation assist      Assist level: Moderate Assistance - Patient 50 - 74% Assistive device: Other (comment) (HHA) Max distance: 153ft   Walk 10 feet activity   Assist     Assist level: Moderate Assistance - Patient - 50 - 74% Assistive device: Other (comment) (HHA)   Walk 50 feet activity   Assist    Assist level:  Moderate Assistance - Patient - 50 - 74% Assistive device: Other (comment) (HHA)    Walk 150 feet activity   Assist Walk 150 feet activity did not occur: Safety/medical concerns (decreased balance/coordination)         Walk 10 feet on uneven surface  activity   Assist     Assist level: Minimal Assistance - Patient > 75% Assistive device: Other (comment) (bilateral handrails)   Wheelchair     Assist Is the patient using a wheelchair?: Yes Type of Wheelchair: Manual    Wheelchair assist level: Supervision/Verbal cueing Max wheelchair distance: 125ft    Wheelchair 50 feet with 2 turns activity    Assist        Assist Level: Supervision/Verbal cueing   Wheelchair 150 feet activity     Assist      Assist Level: Supervision/Verbal cueing   Blood pressure (!) 133/94, pulse 90, temperature 98.2 F (36.8 C), resp. rate 17, height 6\' 2"  (1.88 m), weight 80.4 kg, SpO2 100%.  Medical Problem List and Plan: 1. Functional deficits secondary to infarcts of the left cerebellum likely secondary to left V4 atherosclerotic stenosis versus dissection             -patient may  shower             -ELOS/Goals: 5-7 days, mod I to supervision with PT, OT, SLP  -Continue CIR   2.  Antithrombotics: -DVT/anticoagulation:  Pharmaceutical: Lovenox             -antiplatelet therapy: Aspirin and Plavix for three months followed by aspirin alone  -Vascular ultrasound study with no evidence of DVT bilateral lower extremities   3. Pain Management: Tylenol as needed   4. Mood/Behavior/Sleep: LCSW to evaluate and provide emotional support             -antipsychotic agents: n/a   5. Neuropsych/cognition: This patient is capable of making decisions on his own behalf.   6. Skin/Wound Care: Routine skin care checks   7. Fluids/Electrolytes/Nutrition: Routine Is and Os and follow-up chemistries   8: Urinary retention: continue Flomax   9: Hyperlipidemia: continue Zetia    10: CAD s/p CABG/stents 2014; hx of PAD.  Denies chest pain             -maintained on Plavix due to GERD             -on Zetia (cardiologist encouraged PSK9 or Nexlizet>>lipid clinic follow-up)             -follows with Dr Eden Emms   11: Borderline prediabetes: A1c = 5.7%.  Dietary education  -Monitor glucose on BMPs   12: Hypokalemia: resolved   -Recheck BMP tomorrow   13:Leukocytosis: borderline , pt is afeb    Latest Ref Rng & Units 10/13/2022    6:12 AM 10/10/2022   11:40 AM 03/28/2022    2:40 PM  CBC  WBC 4.0 - 10.5 K/uL 10.4  10.6  6.2   Hemoglobin 13.0 - 17.0 g/dL 95.6  21.3  08.6   Hematocrit 39.0 - 52.0 % 40.2  38.5  39.7   Platelets 150 - 400 K/uL 256  267  273.0       14: Hypertension: monitor TID and prn             -home amlodipine 10 mg held, resume as appropriate  -BP control overall continue to monitor     Vitals:   10/14/22 0424 10/14/22 1318  BP: 107/76 (!) 133/94  Pulse: 69 90  Resp: 17 17  Temp: 97.9 F (36.6 C) 98.2 F (36.8 C)  SpO2: 99% 100%    15. Azotemia  -Recheck BMP tomorrow, encourage fluid intake  16.  Dizziness, intermittent  -Reviewed related to cerebellar stroke, will order orthostatic vital signs   LOS: 2 days A FACE TO FACE EVALUATION WAS PERFORMED  Fanny Dance 10/14/2022, 2:15 PM

## 2022-10-14 NOTE — Progress Notes (Signed)
Physical Therapy Session Note  Patient Details  Name: Eric Dickerson MRN: [id number] Date of Birth: Aug 06, 1950  Today's Date: 10/14/2022 PT Individual Time: 4696-2952 and 1530-1635 PT Individual Time Calculation (min): 68 min and 65 min  Short Term Goals: Week 1:  PT Short Term Goal 1 (Week 1): pt will perform transfers with LRAD and CGA PT Short Term Goal 2 (Week 1): pt will ambulate 122ft with LRAD and CGA PT Short Term Goal 3 (Week 1): pt will navigate 1 small step with LRAD and min A  Skilled Therapeutic Interventions/Progress Updates:    Session 1: Pt received up in recliner, chair alarm activated. Agreeable to PT.   Vestibular Assessment - 10/14/22 0001       Symptom Behavior   Subjective history of current problem Pt reports having a long history of blurry vision that would be triggered by light sensitivity. The blurry sx would last 2-3 minutes. Blurry sx was on/off for years since he was 72 yrs old. In the last month before his stroke occured pt reports an onset of vertigo that would remain for 2-3 minutes, occuring both when hes moving and sitting. Pt reports no vertigo when rolling, looking up, or looking down. Pt reports seeing PCP about vertigo onset and was given meclizine PRN but never took it d/t his stroke occuring shortly after. Pt reports hx of L ear hearing difficulty but relates it to wax buildup that he gets cleared out once a year. He reports his balance being off d/t to the wax build up and the balance deficits resolve after the clearing of wax occurs.    Type of Dizziness  Blurred vision;Imbalance;Unsteady with head/body turns;Vertigo    Duration of Dizziness 2-3 minutes    Symptom Nature Variable    Aggravating Factors Turning head quickly;Turning body quickly    Relieving Factors Head stationary;Closing eyes;Slow movements   light sensitivity     Oculomotor Exam   Oculomotor Alignment Normal    Ocular ROM normal    Spontaneous Absent    Gaze-induced  Absent     Smooth Pursuits Intact    Saccades Intact      Oculomotor Exam-Fixation Suppressed    Spontaneous Nystagmus negative    Gaze evoked nystagmus negative    Left Head Impulse positive, slow delayed corrective saccades    Right Head Impulse negative      Cognition   Cognition Orientation Level Oriented x 4           Time taken at beginning of session for vestibular evaluation. Along w/ pt's hx of visual blurriness, pt seems to present with possible L vestibular hypofunction and L postural instability d/t recent L cerebellar stroke. Both impacting pt's ability to feel stabile on his feet when ambulating and turning. Pt was educated on potential causes of sx and how gaze stabilization, dynamic balance, and vestibular habituation and adaptation interventions can improve his overall balance and sense of stability.  Endurance & dynamic warm up/cool down: pt ambulating from room to 2nd main gym door at beginning of session(~150 ft) w/ RW, CGA. Pt ambulated at end of session from gym to room. Pt presents w/ intermittent L lateral lean, reciprocal gait pattern, B foot inversion, and L hip adduction resulting in NBOS.  Dynamic balance:  - ambulating 240 ft no AD, CGA - minA. Above deficits remain w/ increased instability. - eyes closed walking 10 ft. LOB to the L resulting in mod A recovery to regain balance. - FGA performed to  evaluate balance deficits, see results below.  Standing toileting close supervision. Supervision for hygiene d/t instability. Pt continent to bladder.  Pt left in recliner w/ alarm activated, all needs within reach.  Session 2:  Pt received in recliner, alarm activated, family present, agreeable to PT.  BP 125/90 (101) ; 84 bpm  Standing toileting close supervision. Supervision for hygiene. D/t instability. Pt continent to bladder.  Dynamic balance: - ambulating holding small cone upside down w/ plastic basketball on it switching hands every few steps when SPT says  "switch" for 260 ft, pt dropped ball ~5 times and experienced no LOB. CGA - ambulating 425 ft CGA, +2 for assistance w/ green theraband giving external pertubations laterally, posterior, and sudden no resistance. Pt able to use steppage, hip, and ankle strategies appropriately to external pertubation. - 2x4 B railing alternating steps CGA; 1x4 unilateral on the R railing step to step CGA ; 2x4 stairs unilateral railing on R alternating step CGA - agility ladder: 2 step forward 1 step backward leading with the L x6 length of ladder - CGA & verbal cues for sequencing occasional mild LOB  to the L during first couple of lengths but able to self correct ; 1 foot in each block x2 length with visual reliance. Progress to looking up to read number +2 holds up infront of pt every few steps x6 length  - standing at [employer] plastic basketball at trampoline x10 regular feet width, x10 NBOS, x10 semi tandem R, x10 semi tandem L --- CGA, good ankle and hip strategies - 140 ft CGA catching large basketball from +2 throwing outside of pt BOS laterally and bounce passes. Demo good steppage strategies, no LOB.  Pt demo improvements in dynamic balance throughout session. He reports his BP was low this morning and thinks that might have affected his increased imbalance. Pt would continue to benefit from high level dynamic balance interventions, directional changes, and ambulation without the AD to improve his postural stability and safety.  Pt left in recliner, alarm activated, wife present, all needs met.  Therapy Documentation Precautions:  Precautions Precautions: Fall Restrictions Weight Bearing Restrictions: No  Balance   10/14/22 1102  Balance  Balance Assessed Yes  Standardized Balance Assessment  Standardized Balance Assessment Functional Gait Assessment  Functional Gait  Assessment  Gait assessed  Yes  Gait Level Surface 2  Change in Gait Speed 0  Gait with Horizontal Head Turns 0  Gait  with Vertical Head Turns 0  Gait and Pivot Turn 1  Step Over Obstacle 0  Gait with Narrow Base of Support 0  Gait with Eyes Closed 0  Ambulating Backwards 0  Steps 2  Total Score 5   Patient demonstrates increased fall risk as noted by score of 5/30 on  Functional Gait Assessment.   <22/30 = predictive of falls, <20/30 = fall in 6 months, <18/30 = predictive of falls in PD MCID: 5 points stroke population, 4 points geriatric population (ANPTA Core Set of Outcome Measures for Adults with Neurologic Conditions, 2018)  Pain:  Session 1: denies pain throughout session Session 2: denies pain throughout session   Therapy/Group: Individual Therapy  [name] 10/14/2022, 8:01 AM

## 2022-10-15 DIAGNOSIS — E876 Hypokalemia: Secondary | ICD-10-CM | POA: Diagnosis not present

## 2022-10-15 DIAGNOSIS — R7989 Other specified abnormal findings of blood chemistry: Secondary | ICD-10-CM | POA: Diagnosis not present

## 2022-10-15 DIAGNOSIS — I63542 Cerebral infarction due to unspecified occlusion or stenosis of left cerebellar artery: Secondary | ICD-10-CM | POA: Diagnosis not present

## 2022-10-15 DIAGNOSIS — I10 Essential (primary) hypertension: Secondary | ICD-10-CM | POA: Diagnosis not present

## 2022-10-15 LAB — BASIC METABOLIC PANEL
Anion gap: 7 (ref 5–15)
BUN: 25 mg/dL — ABNORMAL HIGH (ref 8–23)
CO2: 25 mmol/L (ref 22–32)
Calcium: 9.4 mg/dL (ref 8.9–10.3)
Chloride: 105 mmol/L (ref 98–111)
Creatinine, Ser: 1.31 mg/dL — ABNORMAL HIGH (ref 0.61–1.24)
GFR, Estimated: 57 mL/min — ABNORMAL LOW (ref >60)
Glucose, Bld: 105 mg/dL — ABNORMAL HIGH (ref 70–99)
Potassium: 4.2 mmol/L (ref 3.5–5.1)
Sodium: 137 mmol/L (ref 135–145)

## 2022-10-15 NOTE — Progress Notes (Signed)
PROGRESS NOTE   Subjective/Complaints:  No acute events overnight.  No new concerns this morning.  Vascular study with no DVT in lower extremities noted.  ROS- neg CP, SOB, N/V/D, new changes in motor or sensory function + Intermittent dizziness-unchanged  Objective:   VAS [place] LOWER EXTREMITY VENOUS (DVT)  Result Date: 10/15/2022  Lower Venous DVT Study Patient Name:  Eric Dickerson  Date of Exam:   10/13/2022 Medical Rec #: [id number]      Accession #:    [id number] Date of Birth: 05-Oct-1950       Patient Gender: M Patient Age:   72 years Exam Location:  [facility] Procedure:      VAS [place] LOWER EXTREMITY VENOUS (DVT) Referring Phys: Wendi Maya --------------------------------------------------------------------------------  Indications: "edema". Other Indications: Rehab patient. Comparison Study: No previous exams Performing Technologist: Jody Hill RVT, RDMS  Examination Guidelines: A complete evaluation includes B-mode imaging, spectral Doppler, color Doppler, and power Doppler as needed of all accessible portions of each vessel. Bilateral testing is considered an integral part of a complete examination. Limited examinations for reoccurring indications may be performed as noted. The reflux portion of the exam is performed with the patient in reverse Trendelenburg.  +---------+---------------+---------+-----------+----------+--------------+ RIGHT    CompressibilityPhasicitySpontaneityPropertiesThrombus Aging +---------+---------------+---------+-----------+----------+--------------+ CFV      Full           Yes      Yes                                 +---------+---------------+---------+-----------+----------+--------------+ SFJ      Full                                                        +---------+---------------+---------+-----------+----------+--------------+ FV Prox  Full           Yes      Yes                                  +---------+---------------+---------+-----------+----------+--------------+ FV Mid   Full           Yes      Yes                                 +---------+---------------+---------+-----------+----------+--------------+ FV DistalFull           Yes      Yes                                 +---------+---------------+---------+-----------+----------+--------------+ PFV      Full                                                        +---------+---------------+---------+-----------+----------+--------------+  POP      Full           Yes      Yes                                 +---------+---------------+---------+-----------+----------+--------------+ PTV      Full                                                        +---------+---------------+---------+-----------+----------+--------------+ PERO     Full                                                        +---------+---------------+---------+-----------+----------+--------------+   +---------+---------------+---------+-----------+----------+--------------+ LEFT     CompressibilityPhasicitySpontaneityPropertiesThrombus Aging +---------+---------------+---------+-----------+----------+--------------+ CFV      Full           Yes      Yes                                 +---------+---------------+---------+-----------+----------+--------------+ SFJ      Full                                                        +---------+---------------+---------+-----------+----------+--------------+ FV Prox  Full           Yes      Yes                                 +---------+---------------+---------+-----------+----------+--------------+ FV Mid   Full           Yes      Yes                                 +---------+---------------+---------+-----------+----------+--------------+ FV DistalFull           Yes      Yes                                  +---------+---------------+---------+-----------+----------+--------------+ PFV      Full                                                        +---------+---------------+---------+-----------+----------+--------------+ POP      Full           Yes      Yes                                 +---------+---------------+---------+-----------+----------+--------------+ PTV  Full                                                        +---------+---------------+---------+-----------+----------+--------------+ PERO     Full                                                        +---------+---------------+---------+-----------+----------+--------------+     Summary: BILATERAL: - No evidence of deep vein thrombosis seen in the lower extremities, bilaterally. -No evidence of popliteal cyst, bilaterally.   *See table(s) above for measurements and observations. Electronically signed by Heath Lark on 10/15/2022 at 11:07:59 AM.    Final    Recent Labs    10/13/22 0612  WBC 10.4  HGB 12.7*  HCT 40.2  PLT 256   Recent Labs    10/13/22 0612 10/15/22 0508  NA 138 137  K 4.4 4.2  CL 102 105  CO2 25 25  GLUCOSE 100* 105*  BUN 21 25*  CREATININE 1.36* 1.31*  CALCIUM 10.0 9.4    Intake/Output Summary (Last 24 hours) at 10/15/2022 1343 Last data filed at 10/15/2022 0841 Gross per 24 hour  Intake 480 ml  Output 650 ml  Net -170 ml        Physical Exam: Vital Signs Blood pressure 139/79, pulse 80, temperature 98.9 F (37.2 C), temperature source Oral, resp. rate 18, height 6\' 2"  (1.88 m), weight 80.4 kg, SpO2 100%.   General: No acute distress,sitting in chair, appears comfortable  Mood and affect are appropriate Heart: Regular rate and rhythm no rubs murmurs or extra sounds Lungs: CTAB, good air movement Abdomen: Positive bowel sounds, soft nontender to palpation, nondistended Extremities: No clubbing, cyanosis, or edema Skin: Warm dry, no breakdown noted Neurologic:  Cranial nerves II through XII intact, motor strength is 5/5 in bilateral deltoid, bicep, tricep, grip, hip flexor, knee extensors, ankle dorsiflexor and plantar flexor Sensory exam normal sensation to light touch and proprioception in bilateral upper and lower extremities Musculoskeletal: Full range of motion in all 4 extremities. No joint swelling or tenderness noted   Assessment/Plan: 1. Functional deficits which require 3+ hours per day of interdisciplinary therapy in a comprehensive inpatient rehab setting. Physiatrist is providing close team supervision and 24 hour management of active medical problems listed below. Physiatrist and rehab team continue to assess barriers to discharge/monitor patient progress toward functional and medical goals  Care Tool:  Bathing    Body parts bathed by patient: Right arm, Left arm, Chest, Abdomen, Front perineal area, Buttocks, Right upper leg, Left upper leg, Right lower leg, Left lower leg, Face         Bathing assist Assist Level: Minimal Assistance - Patient > 75%     Upper Body Dressing/Undressing Upper body dressing   What is the patient wearing?: Pull over shirt    Upper body assist Assist Level: Supervision/Verbal cueing    Lower Body Dressing/Undressing Lower body dressing      What is the patient wearing?: Incontinence brief, Pants     Lower body assist Assist for lower body dressing: Moderate Assistance - Patient 50 - 74%     Toileting Toileting  Toileting assist Assist for toileting: Contact Guard/Touching assist     Transfers Chair/bed transfer  Transfers assist     Chair/bed transfer assist level: Minimal Assistance - Patient > 75%     Locomotion Ambulation   Ambulation assist      Assist level: Moderate Assistance - Patient 50 - 74% Assistive device: Other (comment) (HHA) Max distance: 167ft   Walk 10 feet activity   Assist     Assist level: Moderate Assistance - Patient - 50 -  74% Assistive device: Other (comment) (HHA)   Walk 50 feet activity   Assist    Assist level: Moderate Assistance - Patient - 50 - 74% Assistive device: Other (comment) (HHA)    Walk 150 feet activity   Assist Walk 150 feet activity did not occur: Safety/medical concerns (decreased balance/coordination)         Walk 10 feet on uneven surface  activity   Assist     Assist level: Minimal Assistance - Patient > 75% Assistive device: Other (comment) (bilateral handrails)   Wheelchair     Assist Is the patient using a wheelchair?: Yes Type of Wheelchair: Manual    Wheelchair assist level: Supervision/Verbal cueing Max wheelchair distance: 145ft    Wheelchair 50 feet with 2 turns activity    Assist        Assist Level: Supervision/Verbal cueing   Wheelchair 150 feet activity     Assist      Assist Level: Supervision/Verbal cueing   Blood pressure 139/79, pulse 80, temperature 98.9 F (37.2 C), temperature source Oral, resp. rate 18, height 6\' 2"  (1.88 m), weight 80.4 kg, SpO2 100%.  Medical Problem List and Plan: 1. Functional deficits secondary to infarcts of the left cerebellum likely secondary to left V4 atherosclerotic stenosis versus dissection             -patient may  shower             -ELOS/Goals: 5-7 days, mod I to supervision with PT, OT, SLP  -Continue CIR  -Ambulated 425 feet yesterday   2.  Antithrombotics: -DVT/anticoagulation:  Pharmaceutical: Lovenox             -antiplatelet therapy: Aspirin and Plavix for three months followed by aspirin alone  -Vascular ultrasound study with no evidence of DVT bilateral lower extremities   3. Pain Management: Tylenol as needed   4. Mood/Behavior/Sleep: LCSW to evaluate and provide emotional support             -antipsychotic agents: n/a   5. Neuropsych/cognition: This patient is capable of making decisions on his own behalf.   6. Skin/Wound Care: Routine skin care checks   7.  Fluids/Electrolytes/Nutrition: Routine Is and Os and follow-up chemistries   8: Urinary retention: continue Flomax   9: Hyperlipidemia: continue Zetia   10: CAD s/p CABG/stents 2014; hx of PAD.  Denies chest pain             -maintained on Plavix due to GERD             -on Zetia (cardiologist encouraged PSK9 or Nexlizet>>lipid clinic follow-up)             -follows with Dr Eden Emms   11: Borderline prediabetes: A1c = 5.7%.  Dietary education  -Monitor glucose on BMPs   12: Hypokalemia: resolved   -9/8 stable at 4.2   13:Leukocytosis: borderline , pt is afeb    Latest Ref Rng & Units 10/13/2022    6:12 AM  10/10/2022   11:40 AM 03/28/2022    2:40 PM  CBC  WBC 4.0 - 10.5 K/uL 10.4  10.6  6.2   Hemoglobin 13.0 - 17.0 g/dL 29.5  28.4  13.2   Hematocrit 39.0 - 52.0 % 40.2  38.5  39.7   Platelets 150 - 400 K/uL 256  267  273.0       14: Hypertension: monitor TID and prn             -home amlodipine 10 mg held, resume as appropriate  -9/8 BP controlled, continue monitor for now     Vitals:   10/15/22 0502 10/15/22 1340  BP: 132/72 139/79  Pulse: 78 80  Resp: 18 18  Temp: 98.7 F (37.1 C) 98.9 F (37.2 C)  SpO2: 99% 100%    15. Azotemia  -9/8 Cr 1.31/25 continue to encourage him to take oral fluids and monitor   16.  Dizziness, intermittent  -Reviewed related to cerebellar stroke, will order orthostatic vital signs   LOS: 3 days A FACE TO FACE EVALUATION WAS PERFORMED  Fanny Dance 10/15/2022, 1:43 PM

## 2022-10-15 NOTE — Plan of Care (Signed)
  Problem: RH BLADDER ELIMINATION Goal: RH STG MANAGE BLADDER WITH ASSISTANCE Description: STG Manage Bladder With toileting Assistance Outcome: Progressing Goal: RH STG MANAGE BLADDER WITH MEDICATION WITH ASSISTANCE Description: STG Manage Bladder With Medication With mod I Assistance. Outcome: Progressing   Problem: RH KNOWLEDGE DEFICIT Goal: RH STG INCREASE KNOWLEDGE OF HYPERTENSION Description: Patient and spouse will be able to manage HTN with medication and dietary modification using educational resources independently Outcome: Progressing

## 2022-10-15 NOTE — Plan of Care (Signed)
  Problem: Consults Goal: RH STROKE PATIENT EDUCATION Description: See Patient Education module for education specifics  Outcome: Progressing   Problem: RH BLADDER ELIMINATION Goal: RH STG MANAGE BLADDER WITH MEDICATION WITH ASSISTANCE Description: STG Manage Bladder With Medication With mod I Assistance. Outcome: Progressing   Problem: RH SAFETY Goal: RH STG ADHERE TO SAFETY PRECAUTIONS W/ASSISTANCE/DEVICE Description: STG Adhere to Safety Precautions With Assistance/Device. Outcome: Progressing   Problem: Education: Goal: Knowledge of disease or condition will improve Outcome: Progressing Goal: Knowledge of secondary prevention will improve (MUST DOCUMENT ALL) Outcome: Progressing Goal: Knowledge of patient specific risk factors will improve Loraine Leriche N/A or DELETE if not current risk factor) Outcome: Progressing   Problem: Coping: Goal: Will verbalize positive feelings about self Outcome: Progressing Goal: Will identify appropriate support needs Outcome: Progressing   Problem: Health Behavior/Discharge Planning: Goal: Ability to manage health-related needs will improve Outcome: Progressing   Problem: Self-Care: Goal: Ability to communicate needs accurately will improve Outcome: Progressing   Problem: Nutrition: Goal: Dietary intake will improve Outcome: Progressing

## 2022-10-16 DIAGNOSIS — I639 Cerebral infarction, unspecified: Secondary | ICD-10-CM | POA: Diagnosis not present

## 2022-10-16 LAB — CBC
HCT: 42.4 % (ref 39.0–52.0)
Hemoglobin: 13.6 g/dL (ref 13.0–17.0)
MCH: 28.3 pg (ref 26.0–34.0)
MCHC: 32.1 g/dL (ref 30.0–36.0)
MCV: 88.3 fL (ref 80.0–100.0)
Platelets: 293 10*3/uL (ref 150–400)
RBC: 4.8 MIL/uL (ref 4.22–5.81)
RDW: 14.2 % (ref 11.5–15.5)
WBC: 8.2 10*3/uL (ref 4.0–10.5)
nRBC: 0 % (ref 0.0–0.2)

## 2022-10-16 LAB — BASIC METABOLIC PANEL
Anion gap: 11 (ref 5–15)
BUN: 19 mg/dL (ref 8–23)
CO2: 25 mmol/L (ref 22–32)
Calcium: 9.5 mg/dL (ref 8.9–10.3)
Chloride: 102 mmol/L (ref 98–111)
Creatinine, Ser: 1.14 mg/dL (ref 0.61–1.24)
GFR, Estimated: >60 mL/min (ref >60)
Glucose, Bld: 108 mg/dL — ABNORMAL HIGH (ref 70–99)
Potassium: 4 mmol/L (ref 3.5–5.1)
Sodium: 138 mmol/L (ref 135–145)

## 2022-10-16 NOTE — Progress Notes (Signed)
Occupational Therapy Session Note  Patient Details  Name: Eric Dickerson MRN: [id number] Date of Birth: 1951-01-20  Today's Date: 10/16/2022 OT Individual Time: 1416-1530 OT Individual Time Calculation (min): 74 min    Short Term Goals: Week 1:  OT Short Term Goal 1 (Week 1): Pt able to stand with supervision to perform 2 grooming tasks OT Short Term Goal 2 (Week 1): Pt perform transfer on/ off toilet or void in standing wtih supervision OT Short Term Goal 3 (Week 1): Pt would be able to stand for showering with supervision  Skilled Therapeutic Interventions/Progress Updates:     Pt received sitting up in recliner presenting to be in good spirits receptive to skilled OT session reporting 0/10 pain- OT offering intermittent rest breaks, repositioning, and therapeutic support to optimize participation in therapy session. Focus this session BP assessment, activity tolerance, dynamic standing balance. Prior to therapy session, therapy team made aware of Pt's increased BP and tachycardia in previous therapy session. No restrictions given- OT closely monitored Pt's vitals throughout session.   Vitals sitting at beginning of session: BP 129/102 (110) HR 95 PSO2 100%  Pt requesting to use restroom at beginning of therapy session. Pt completed functional mobility into bathroom and transferred to toilet using grab bars light min A for balance no AD. Provided significantly increased amount of time on toilet d/t need for BM, however no BM at this time. Pt able to perform 3/3 toileting tasks with CGA this session. Functional mobility to sink no AD light min A and Pt completed hand washing task in standing CGA with no dizziness reported.   Vitals assessed following: BP 140/97 (112) HR 97 PSO2 99%. Pt reporting BP reading was "normal" for him.  Engaged Pt in completing functional mobility to therapy gym no AD for endurance training with Pt ambulating >167ft with CGA to occasional min A provided for balance  with seated rest break proivded following.   Vitals assessed in sitting following ambulation: BP144/106(117) HR 86 PSO2 99%  Pt completed step-taps in standing without AD completing 2x10 reps R, L, and alternating R/L for increased balance challenge to work on single leg balance, step height, LB coordination, and standing tolerance with cone placed on floor as external visual cue. Pt required CGA to complete on R side, however min A required for balance when completing on L side. CGA to occasional min A provided when alternating stepping R/L.   Vitals taken in sitting following: 118/88(97) [not sure if this is accurate] HR 98 PSO2 100% Reassed vitals d/t unsure of accuracy of first reading: BP 131/94 (106) HR 98 PSO2 100%  Engaged Pt in dynamic standing balance activity with posterior and overhead anterior reaching incorporated into task to work on skills required for BADLs and increase overall activity tolerance. Donned 1.5# bilateral wrist weights on Pt with Pt tasked with maintaining dynamic standing balance on compliant surface no AD while reaching posteriorly to retrieve horse shoe from mat table and passing it to his opposite hand to place it over hop of tall vertical mirror. Pt complete x2 trials in each direction CGA to light min A provided for balance and tactile cues provided for weight shifting and midline orientation. Pt able to maintain standing balance during task ~4 minutes x2 trials with rest breaks provided between trials.   Vitals assessed following: BP 150/98 (111) HR 97 PSO2 97%  Pt completed functional mobility back to his room no AD with CGA provided for balance. Pt was left resting in recliner  with call bell in reach, chair alarm on, family present in room, and all needs met.    Therapy Documentation Precautions:  Precautions Precautions: Fall Restrictions Weight Bearing Restrictions: No   Therapy/Group: Individual Therapy  [name] 10/16/2022, 8:01 AM

## 2022-10-16 NOTE — Progress Notes (Signed)
Physical Therapy Session Note  Patient Details  Name: Eric Dickerson MRN: [id number] Date of Birth: 06-Jun-1950  Today's Date: 10/16/2022 PT Individual Time: 0920-1001 PT Individual Time Calculation (min): 41 min   Short Term Goals: Week 1:  PT Short Term Goal 1 (Week 1): pt will perform transfers with LRAD and CGA PT Short Term Goal 2 (Week 1): pt will ambulate 163ft with LRAD and CGA PT Short Term Goal 3 (Week 1): pt will navigate 1 small step with LRAD and min A  Skilled Therapeutic Interventions/Progress Updates:      Pt seated in recliner upon arriva. Pt agreeable to therapy. Pt denies any pain.   Pt reports need to use BR. Pt ambulated to bathroom with CGA, pt continent of bladder in standing. Pt donned and doffed pants and washed hands with CGA.   Ambulated with no AD with CGA/min A (for LOB with navigating turns)  Tandem walking on green line, with R HHA verbal and tactile cues provided for slowing down and technique. Pt Weaved in and out of cones with no AD and CGA. Pt stepped over cones with CGA, min A verbal and tactile cues provided to get closer to cone prior to stepping over and slow down.   Pt reports lightheadedness, Vitals assessed: BP 130/100 with seated rest break. Pt reports decreased lightheadedness after seated rest break. BP 134/104 after ambulation to room. Notified MD.   Pt performed standing with feet together x1 min on firm surface with eyes open with sup/CGA, progressed to eyes closed with feet together and min-mod A need 2/2 L lateral LOB. Pt then closed eyes with feet shoulder width apart progressively moving feet closer together in 1 min increments x 3 with eyes closed, with verbal and tactile cues provided for ankle strategy technique and weight shift, pt completed with CGA/sup.   Pt seated in recliner at end of session with all needs within reach and seatbelt alarm on.    Therapy Documentation Precautions:  Precautions Precautions:  Fall Restrictions Weight Bearing Restrictions: No     Therapy/Group: Individual Therapy  [facility] Ambrose Finland, [place], DPT  10/16/2022, 7:44 AM

## 2022-10-16 NOTE — Progress Notes (Signed)
Occupational Therapy Session Note  Patient Details  Name: Eric Dickerson MRN: [id number] Date of Birth: 23-Nov-1950  Today's Date: 10/16/2022 OT Individual Time: 7829-5621 OT Individual Time Calculation (min): 25 min    Short Term Goals: Week 1:  OT Short Term Goal 1 (Week 1): Pt able to stand with supervision to perform 2 grooming tasks OT Short Term Goal 2 (Week 1): Pt perform transfer on/ off toilet or void in standing wtih supervision OT Short Term Goal 3 (Week 1): Pt would be able to stand for showering with supervision  Skilled Therapeutic Interventions/Progress Updates:  Skilled OT intervention completed with focus on BP assessment, BUE strengthening. Pt received seated in recliner, agreeable to session. No pain reported however did express lightheadedness with concern about elevated BP in prior session.   Pt requested OT to recheck BP. See below. MD notified of results as HR tachycardic with simple standing. Encouraged fluids, however pt indicates he has history of prostate problems therefore has frequent urination. Encouraged 8 cups of water a day as pt reports only drinking about 2-4. Filled pt's water jug up per request.  Pt required CGA for sit > stand without AD, remained standing for BP assessment with close supervision.   Seated pt completed the following BUE exercises to promote strength and cardiovascular endurance needed for BADLs: (With 5 lb dumbbell, x15 each arm) -bicep curls -chest press (With 4 lb dumbbell, x10 each arm) -overhead press  Pt remained seated in recliner, with chair alarm on/activated, and with all needs in reach at end of session.  Vitals BP 126/87 (at rest, sitting with legs elevated), HR 96 bpm; asymptomatic BP 145/110 (standing), HR 124 bpm; asymptomatic HR 96 bpm with seated activity   Therapy Documentation Precautions:  Precautions Precautions: Fall Restrictions Weight Bearing Restrictions: No    Therapy/Group: Individual  Therapy  Melvyn Novas, MS, OTR/L  10/16/2022, 12:19 PM

## 2022-10-16 NOTE — Progress Notes (Signed)
Physical Therapy Session Note  Patient Details  Name: Eric Dickerson MRN: [id number] Date of Birth: April 30, 1950  Today's Date: 10/16/2022 PT Individual Time: 1100-1200 PT Individual Time Calculation (min): 60 min   Short Term Goals: Week 1:  PT Short Term Goal 1 (Week 1): pt will perform transfers with LRAD and CGA PT Short Term Goal 2 (Week 1): pt will ambulate 149ft with LRAD and CGA PT Short Term Goal 3 (Week 1): pt will navigate 1 small step with LRAD and min A  Skilled Therapeutic Interventions/Progress Updates:    Pt presents up in recliner. Discussed overall presentation, symptoms over last couple days, and goals of session. Pt asymptomatic during this session (both vestibular and BP wise). Pt reports his diastolic BP has always been in the 90-100's range - MD also aware after discussed with nursing who stated pt does not have BP restrictions at this time.   Focused session on neuro re-ed to address overall balance impairments, dynamic tasks including head turns, and functional mobility. Pt performed basic transfers throughout session with close supervision to CGA including toileting in standing at start and end of session. Pt performed gait to and from therapy session for overall endurance training and functional mobility practice without AD > 150' with close supervision to CGA with occasional mild LOB which pt able to recover with CGA. Engaged in dynamic gait activities while locating items at various heights and collecting these items tasked with a certain amount to keep track of, then repeated same activity in new location and added pt having to carry a bin of the items he collected. Then performed dynamic reaching task on large shelf including taking large and smaller items on and off a shelf loading into a grocery cart to simulate household and some of his "work" tasks including items overhead and larger box style items with overall CGA for balance.   End of session returned to room as  described above and set up in recliner with all needs in reach.   Therapy Documentation Precautions:  Precautions Precautions: Fall Restrictions Weight Bearing Restrictions: No  Vital Signs: LUE Seated: 134/108 mmHg; MAP = 117; HR = 108 bpm LUE Seated: 140/106 mmHg; MAP = 117; HR = 108 bpm RUE Seated: 151/107 mmHg; MAP = 120;  RUE Standing: 164/112 mmHg; MAP = 128; HR = 118 bpm Pain:  No reports of pain. States he has h/o of chronic back and knee pain. No interventions needed.    Therapy/Group: Individual Therapy  Karolee Stamps Darrol Poke, PT, DPT, CBIS  10/16/2022, 12:03 PM

## 2022-10-16 NOTE — Progress Notes (Signed)
PROGRESS NOTE   Subjective/Complaints:  Pt wondering if night sweats yesterday were from sleep med,  no fever or chills, no cough or dysuria   ROS- neg CP, SOB, N/V/D,+ night sweats   Objective:   No results found. No results for input(s): "WBC", "HGB", "HCT", "PLT" in the last 72 hours.  Recent Labs    10/15/22 0508  NA 137  K 4.2  CL 105  CO2 25  GLUCOSE 105*  BUN 25*  CREATININE 1.31*  CALCIUM 9.4    Intake/Output Summary (Last 24 hours) at 10/16/2022 0741 Last data filed at 10/16/2022 0609 Gross per 24 hour  Intake 510 ml  Output 675 ml  Net -165 ml        Physical Exam: Vital Signs Blood pressure (!) 131/94, pulse 89, temperature 97.9 F (36.6 C), resp. rate 18, height 6\' 2"  (1.88 m), weight 80.4 kg, SpO2 100%.   General: No acute distress Mood and affect are appropriate Heart: Regular rate and rhythm no rubs murmurs or extra sounds Lungs: Clear to auscultation, breathing unlabored, no rales or wheezes Abdomen: Positive bowel sounds, soft nontender to palpation, nondistended Extremities: No clubbing, cyanosis, or edema Skin: No evidence of breakdown, no evidence of rash   Neurologic: Cranial nerves II through XII intact, motor strength is 5/5 in bilateral deltoid, bicep, tricep, grip, hip flexor, knee extensors, ankle dorsiflexor and plantar flexor Sensory exam normal sensation to light touch  in bilateral upper and lower extremities Musculoskeletal: Full range of motion in all 4 extremities. No joint swelling or tenderness noted   Assessment/Plan: 1. Functional deficits which require 3+ hours per day of interdisciplinary therapy in a comprehensive inpatient rehab setting. Physiatrist is providing close team supervision and 24 hour management of active medical problems listed below. Physiatrist and rehab team continue to assess barriers to discharge/monitor patient progress toward functional and  medical goals  Care Tool:  Bathing    Body parts bathed by patient: Right arm, Left arm, Chest, Abdomen, Front perineal area, Buttocks, Right upper leg, Left upper leg, Right lower leg, Left lower leg, Face         Bathing assist Assist Level: Minimal Assistance - Patient > 75%     Upper Body Dressing/Undressing Upper body dressing   What is the patient wearing?: Pull over shirt    Upper body assist Assist Level: Supervision/Verbal cueing    Lower Body Dressing/Undressing Lower body dressing      What is the patient wearing?: Incontinence brief, Pants     Lower body assist Assist for lower body dressing: Moderate Assistance - Patient 50 - 74%     Toileting Toileting    Toileting assist Assist for toileting: Contact Guard/Touching assist     Transfers Chair/bed transfer  Transfers assist     Chair/bed transfer assist level: Minimal Assistance - Patient > 75%     Locomotion Ambulation   Ambulation assist      Assist level: Moderate Assistance - Patient 50 - 74% Assistive device: Other (comment) (HHA) Max distance: 142ft   Walk 10 feet activity   Assist     Assist level: Moderate Assistance - Patient - 50 - 74% Assistive  device: Other (comment) (HHA)   Walk 50 feet activity   Assist    Assist level: Moderate Assistance - Patient - 50 - 74% Assistive device: Other (comment) (HHA)    Walk 150 feet activity   Assist Walk 150 feet activity did not occur: Safety/medical concerns (decreased balance/coordination)         Walk 10 feet on uneven surface  activity   Assist     Assist level: Minimal Assistance - Patient > 75% Assistive device: Other (comment) (bilateral handrails)   Wheelchair     Assist Is the patient using a wheelchair?: Yes Type of Wheelchair: Manual    Wheelchair assist level: Supervision/Verbal cueing Max wheelchair distance: 14ft    Wheelchair 50 feet with 2 turns activity    Assist         Assist Level: Supervision/Verbal cueing   Wheelchair 150 feet activity     Assist      Assist Level: Supervision/Verbal cueing   Blood pressure (!) 131/94, pulse 89, temperature 97.9 F (36.6 C), resp. rate 18, height 6\' 2"  (1.88 m), weight 80.4 kg, SpO2 100%.  Medical Problem List and Plan: 1. Functional deficits secondary to infarcts of the left cerebellum likely secondary to left V4 atherosclerotic stenosis versus dissection             -patient may  shower             -ELOS/Goals: 5-7 days, mod I to supervision with PT, OT, SLP  -Continue CIR PT, OT    2.  Antithrombotics: -DVT/anticoagulation:  Pharmaceutical: Lovenox             -antiplatelet therapy: Aspirin and Plavix for three months followed by aspirin alone  -Vascular ultrasound study with no evidence of DVT bilateral lower extremities   3. Pain Management: Tylenol as needed   4. Mood/Behavior/Sleep: LCSW to evaluate and provide emotional support             -antipsychotic agents: n/a   5. Neuropsych/cognition: This patient is capable of making decisions on his own behalf.   6. Skin/Wound Care: Routine skin care checks   7. Fluids/Electrolytes/Nutrition: Routine Is and Os and follow-up chemistries   8: Urinary retention: continue Flomax   9: Hyperlipidemia: continue Zetia   10: CAD s/p CABG/stents 2014; hx of PAD.  Denies chest pain             -maintained on Plavix due to GERD             -on Zetia (cardiologist encouraged PSK9 or Nexlizet>>lipid clinic follow-up)             -follows with Dr Eden Emms   11: Borderline prediabetes: A1c = 5.7%.  Dietary education  -Monitor glucose on BMPs   12: Hypokalemia: resolved   -9/8 stable at 4.2   13:Leukocytosis: borderline , pt is afeb    Latest Ref Rng & Units 10/13/2022    6:12 AM 10/10/2022   11:40 AM 03/28/2022    2:40 PM  CBC  WBC 4.0 - 10.5 K/uL 10.4  10.6  6.2   Hemoglobin 13.0 - 17.0 g/dL 52.8  41.3  24.4   Hematocrit 39.0 - 52.0 % 40.2  38.5   39.7   Platelets 150 - 400 K/uL 256  267  273.0       14: Hypertension: monitor TID and prn             -home amlodipine 10 mg held, resume as appropriate  Vitals:   10/15/22 1841 10/16/22 0411  BP: 126/78 (!) 131/94  Pulse: 77 89  Resp: 18 18  Temp: 98.9 F (37.2 C) 97.9 F (36.6 C)  SpO2: 100% 100%   BP sl elevated today will monitor prior to resuming amlodipine 15. Azotemia  -9/8 Cr 1.31/25 continue to encourage him to take oral fluids and monitor     Latest Ref Rng & Units 10/15/2022    5:08 AM 10/13/2022    6:12 AM 10/12/2022    9:10 AM  BMP  Glucose 70 - 99 mg/dL 161  096  045   BUN 8 - 23 mg/dL 25  21  17    Creatinine 0.61 - 1.24 mg/dL 4.09  8.11  9.14   Sodium 135 - 145 mmol/L 137  138  137   Potassium 3.5 - 5.1 mmol/L 4.2  4.4  3.4   Chloride 98 - 111 mmol/L 105  102  103   CO2 22 - 32 mmol/L 25  25  25    Calcium 8.9 - 10.3 mg/dL 9.4  78.2  9.5   Fluid intake yesterday   16.  Dizziness, intermittent  -Reviewed related to cerebellar stroke, will order orthostatic vital signs   LOS: 4 days A FACE TO FACE EVALUATION WAS PERFORMED  Erick Colace 10/16/2022, 7:41 AM

## 2022-10-17 DIAGNOSIS — I639 Cerebral infarction, unspecified: Secondary | ICD-10-CM | POA: Diagnosis not present

## 2022-10-17 MED ORDER — AMLODIPINE BESYLATE 5 MG PO TABS
5.0000 mg | ORAL_TABLET | Freq: Every day | ORAL | Status: DC
  Administered 2022-10-17 – 2022-10-20 (×4): 5 mg via ORAL
  Filled 2022-10-17 (×4): qty 1

## 2022-10-17 NOTE — Progress Notes (Signed)
Occupational Therapy Session Note  Patient Details  Name: Eric Dickerson MRN: [id number] Date of Birth: [birth year]  Today's Date: 10/17/2022 OT Individual Time: 0804-0900 OT Individual Time Calculation (min): 56 min  PM Session:  OT Individual Time: 7829-5621 OT Individual Time Calculation (min): 41 min   Short Term Goals: Week 1:  OT Short Term Goal 1 (Week 1): Pt able to stand with supervision to perform 2 grooming tasks OT Short Term Goal 2 (Week 1): Pt perform transfer on/ off toilet or void in standing wtih supervision OT Short Term Goal 3 (Week 1): Pt would be able to stand for showering with supervision  Skilled Therapeutic Interventions/Progress Updates:     AM Session:  Pt received semi-reclined in bed presenting to be in good spirits receptive to skilled OT session reporting 0/10 pain- OT offering intermittent rest breaks, repositioning, and therapeutic support to optimize participation in therapy session. Pt dressed and ready for the day upon OT arrival politely declining need for shower with all BADL needs met. Focused beginning of session on d/c planning with Pt reporting he lives in single level entry and uses a walk-in shower with built-in bench and grab bars for bathing tasks. Discussed and provided education on DME options to increase Pt's safety- determined no DME needed at this time. Pt's wife works part-time during the day, however Pt's DTR is able to provide supervision during those hours if 24/7 supervision is needed at d/c.   Pt transitioned to EOB supervision. Engaged Pt in completing functional mobility ~150 ft to therapy gym no AD with CGA provided for balance.   Vitals assessed immediately following ambulation HR 111 BP 150/91  Provided ~5 minutes of rest following ambulation and reassessed vital HR 111 BP 137/90 (105)  Engaged Pt in series of dynamic balance activities with Pt tasked with weaving in and out between cones, stepping over cones, and weaving  forwards/backwards between cones to simulate completing functional mobility in community/home environment with sudden changes in directions facilitated during task. Pt able to complete task with no dizziness reported, single LOB with CGA provided to increase Pt's safety.   Vitals assessed following in sitting HR 124 BP 154/92 (107)  Vitals assessed again following ~3 minutes of rest HR 113 BP 135/95 (110)   Engaged pt in dynamic standing balance task at rebound trampoline to address Pt's endurance, activity tolerance, reaction time, and coordination deficits. Pt able to complete 3x15 reps with CGA provided for balance with initial trial Pt completing task using basketball and challenge increased to using 6# weighted ball during final two trials and rest break provided following. Pt presenting with improved protective responses and increased balance during activity.    Engaged Pt in functional mobility task with visual scanning R/L facilitated during task. Pt tasked with turing head R/L to loacte colored disks  while completing functional mobility through hallway no AD. Pt required close supervision to CGA during activity with occasional scissoring of gait noted when Pt attending to locating disks vs balance with dual tasking challenge noted.    Assessed Pt's vitals in sitting following functional mobility activity:  BP 136/80 (95) HR 125 PSO2 100% (Pt's HR did decreased to 110 with increased time)  Pt completed functional mobility back to room no AD with close supervision no LOB noted. Pt requesting to use restroom at end of session standing at toilet to void with close supervision provided for safety. Pt was left resting in recliner with call bell in reach, chair alarm on,  and all needs met.    PM Session:  Pt received sitting up in recliner with DTR present in room. Pt presenting to be in good spirits receptive to skilled OT session reporting 0/10 pain- OT offering intermittent rest breaks,  repositioning, and therapeutic support to optimize participation in therapy session. Pt requesting to complete shower this session. Pt completed functional mobility to BR no AD with close supervision and stood at toilet to void using grab bar for balance. Provided education on fall prevention and completing dressing/bathing tasks seated when possible with pt receptive to education. Pt doffed clothing seated on TTB with close supervision. Pt able to complete U/LB bathing tasks with close supervision and standing to wash bottom using grab bars for balance. Pt demonstrating appropriate safety awareness and increased use of energy conservation techniques during shower. Pt then complete U/LB dressing seated on TTB with close supervision and standing to bring pants to waist. Pt able to donn socks and shoes with supervision. Pt completed functional mobility back to room and transferred to recliner supervision. Discussed follow-up therapy options at end of session and provided education on recommendation for OPOT with Pt receptive to plan. Pt was left resting in recliner with call bell in reach, seat belt alarm on, and all needs met.    Therapy Documentation Precautions:  Precautions Precautions: Fall Restrictions Weight Bearing Restrictions: No   Therapy/Group: Individual Therapy  [name] 10/17/2022, 7:53 AM

## 2022-10-17 NOTE — Progress Notes (Signed)
PROGRESS NOTE   Subjective/Complaints:  Mild tachy during PT/OT yesterday  ROS- neg CP, SOB, N/V/D,+ night sweats   Objective:   No results found. Recent Labs    10/16/22 0703  WBC 8.2  HGB 13.6  HCT 42.4  PLT 293    Recent Labs    10/15/22 0508 10/16/22 0703  NA 137 138  K 4.2 4.0  CL 105 102  CO2 25 25  GLUCOSE 105* 108*  BUN 25* 19  CREATININE 1.31* 1.14  CALCIUM 9.4 9.5    Intake/Output Summary (Last 24 hours) at 10/17/2022 0732 Last data filed at 10/16/2022 1838 Gross per 24 hour  Intake 555 ml  Output --  Net 555 ml        Physical Exam: Vital Signs Blood pressure (!) 134/93, pulse 85, temperature 98.4 F (36.9 C), temperature source Oral, resp. rate 17, height 6\' 2"  (1.88 m), weight 80.4 kg, SpO2 100%.   General: No acute distress Mood and affect are appropriate Heart: Regular rate and rhythm no rubs murmurs or extra sounds Lungs: Clear to auscultation, breathing unlabored, no rales or wheezes Abdomen: Positive bowel sounds, soft nontender to palpation, nondistended Extremities: No clubbing, cyanosis, or edema Skin: No evidence of breakdown, no evidence of rash   Neurologic: Cranial nerves II through XII intact, motor strength is 5/5 in bilateral deltoid, bicep, tricep, grip, hip flexor, knee extensors, ankle dorsiflexor and plantar flexor Sensory exam normal sensation to light touch  in bilateral upper and lower extremities Musculoskeletal: Full range of motion in all 4 extremities. No joint swelling or tenderness noted   Assessment/Plan: 1. Functional deficits which require 3+ hours per day of interdisciplinary therapy in a comprehensive inpatient rehab setting. Physiatrist is providing close team supervision and 24 hour management of active medical problems listed below. Physiatrist and rehab team continue to assess barriers to discharge/monitor patient progress toward functional and  medical goals  Care Tool:  Bathing    Body parts bathed by patient: Right arm, Left arm, Chest, Abdomen, Front perineal area, Buttocks, Right upper leg, Left upper leg, Right lower leg, Left lower leg, Face         Bathing assist Assist Level: Minimal Assistance - Patient > 75%     Upper Body Dressing/Undressing Upper body dressing   What is the patient wearing?: Pull over shirt    Upper body assist Assist Level: Supervision/Verbal cueing    Lower Body Dressing/Undressing Lower body dressing      What is the patient wearing?: Incontinence brief, Pants     Lower body assist Assist for lower body dressing: Moderate Assistance - Patient 50 - 74%     Toileting Toileting    Toileting assist Assist for toileting: Contact Guard/Touching assist     Transfers Chair/bed transfer  Transfers assist     Chair/bed transfer assist level: Contact Guard/Touching assist     Locomotion Ambulation   Ambulation assist      Assist level: Contact Guard/Touching assist Assistive device: No Device Max distance: 200'   Walk 10 feet activity   Assist     Assist level: Contact Guard/Touching assist Assistive device: No Device   Walk 50 feet  activity   Assist    Assist level: Contact Guard/Touching assist Assistive device: No Device    Walk 150 feet activity   Assist Walk 150 feet activity did not occur: Safety/medical concerns (decreased balance/coordination)  Assist level: Contact Guard/Touching assist Assistive device: No Device    Walk 10 feet on uneven surface  activity   Assist     Assist level: Contact Guard/Touching assist Assistive device: Other (comment) (bilateral handrails)   Wheelchair     Assist Is the patient using a wheelchair?: Yes Type of Wheelchair: Manual    Wheelchair assist level: Supervision/Verbal cueing Max wheelchair distance: 124ft    Wheelchair 50 feet with 2 turns activity    Assist        Assist  Level: Supervision/Verbal cueing   Wheelchair 150 feet activity     Assist      Assist Level: Supervision/Verbal cueing   Blood pressure (!) 134/93, pulse 85, temperature 98.4 F (36.9 C), temperature source Oral, resp. rate 17, height 6\' 2"  (1.88 m), weight 80.4 kg, SpO2 100%.  Medical Problem List and Plan: 1. Functional deficits secondary to infarcts of the left cerebellum likely secondary to left V4 atherosclerotic stenosis versus dissection             -patient may  shower             -ELOS/Goals: 5-7 days, mod I to supervision with PT, OT, SLP- team conf in am   -Continue CIR PT, OT    2.  Antithrombotics: -DVT/anticoagulation:  Pharmaceutical: Lovenox             -antiplatelet therapy: Aspirin and Plavix for three months followed by aspirin alone  -Vascular ultrasound study with no evidence of DVT bilateral lower extremities   3. Pain Management: Tylenol as needed   4. Mood/Behavior/Sleep: LCSW to evaluate and provide emotional support             -antipsychotic agents: n/a   5. Neuropsych/cognition: This patient is capable of making decisions on his own behalf.   6. Skin/Wound Care: Routine skin care checks   7. Fluids/Electrolytes/Nutrition: Routine Is and Os and follow-up chemistries   8: Urinary retention: continue Flomax   9: Hyperlipidemia: continue Zetia   10: CAD s/p CABG/stents 2014; hx of PAD.  Denies chest pain             -maintained on Plavix due to GERD             -on Zetia (cardiologist encouraged PSK9 or Nexlizet>>lipid clinic follow-up)             -follows with Dr Eden Emms   11: Borderline prediabetes: A1c = 5.7%.  Dietary education  -Monitor glucose on BMPs   12: Hypokalemia: resolved   -9/8 stable at 4.2   13:Leukocytosis: borderline , pt is afeb    Latest Ref Rng & Units 10/16/2022    7:03 AM 10/13/2022    6:12 AM 10/10/2022   11:40 AM  CBC  WBC 4.0 - 10.5 K/uL 8.2  10.4  10.6   Hemoglobin 13.0 - 17.0 g/dL 95.2  84.1  32.4    Hematocrit 39.0 - 52.0 % 42.4  40.2  38.5   Platelets 150 - 400 K/uL 293  256  267       14: Hypertension: monitor TID and prn             -home amlodipine 10 mg held, resume as appropriate  Vitals:   10/16/22 1836 10/17/22 0450  BP: (!) 140/93 (!) 134/93  Pulse: 84 85  Resp: 18 17  Temp: 97.8 F (36.6 C) 98.4 F (36.9 C)  SpO2: 100% 100%   BP sl elevated today per d/c summ from acute resume amlodipine, now is 7d post CVA , start 5mg  (home dose is 10mg )  15. Azotemia  -9/8 Cr 1.31/25 continue to encourage him to take oral fluids and monitor     Latest Ref Rng & Units 10/16/2022    7:03 AM 10/15/2022    5:08 AM 10/13/2022    6:12 AM  BMP  Glucose 70 - 99 mg/dL 469  629  528   BUN 8 - 23 mg/dL 19  25  21    Creatinine 0.61 - 1.24 mg/dL 4.13  2.44  0.10   Sodium 135 - 145 mmol/L 138  137  138   Potassium 3.5 - 5.1 mmol/L 4.0  4.2  4.4   Chloride 98 - 111 mmol/L 102  105  102   CO2 22 - 32 mmol/L 25  25  25    Calcium 8.9 - 10.3 mg/dL 9.5  9.4  27.2   Creat now is normal   16.  Dizziness, intermittent  -Reviewed related to cerebellar stroke, will order orthostatic vital signs   LOS: 5 days A FACE TO FACE EVALUATION WAS PERFORMED  Erick Colace 10/17/2022, 7:32 AM

## 2022-10-17 NOTE — Progress Notes (Signed)
Physical Therapy Session Note  Patient Details  Name: Eric Dickerson MRN: [id number] Date of Birth: 10-15-1950  Today's Date: 10/17/2022 PT Individual Time: 1105-1200 and 1650-1736 PT Individual Time Calculation (min): 55 min and 46 min  Short Term Goals: Week 1:  PT Short Term Goal 1 (Week 1): pt will perform transfers with LRAD and CGA PT Short Term Goal 2 (Week 1): pt will ambulate 159ft with LRAD and CGA PT Short Term Goal 3 (Week 1): pt will navigate 1 small step with LRAD and min A  Skilled Therapeutic Interventions/Progress Updates:    Session 1: Pt received in recliner, alarm activated, daughter at bedside.  Toileting at beginning of session. Ambulating to restroom and to sink --- CGA. Standing for voiding, toileting hygiene, and hand hygiene --- Supervision   Dynamic balance: - Ambulating w/o AD room > gym CGA at beginning of session. Gym > room supervision at end of session. -Side stepping x2 down and back 30 ft, CGA. Verbal cues to keep hips forward and to step foot directly to the side rather than stepping forward. - Quick reactionary 360 degree turns to the R or L depending on what SPT calls out. CGA x10 after 30 ft. CGA - backward walking 30 ft & then quickly switching to fast walking forward when SPT says "switch", vice versa. X10 every 30 ft. CGA. Pt demo excellent balance, no LOB, improvements in reactionary strategies as well as gait velocity both forward and backward. - To improve vestibular & somatosensory input by downweighing the visual input. Pt walking with eyes closed 50 ft x4 w/ unilateral support from the railing. CGA. Pt use of wall or railing for tactile feedback when deviating to the left. Progress to HHA(MinA) on the L 150 ft x 3, pt demo improvements in self correction and less L deviation. CGA by +2 for balance/safety. - rotating trunk & reaching down to the L grabbing a bean bag on a 5.5" step. Side stepping to the R over 2 poles & placing bean bag on tray  table at chest height. Match each bean bag color into a pile of the same color. Then side stepping to the L back over 2 poles & repeating x10; repeat above in opposite direction. Supervision - CGA throughout.  Pt left in recliner, alarm activated, all need met, daughter present.   Session 2:  Pt received in recliner, alarm NOT activated, wife present.  Dynamic balance:  - pt ambulated from room> outside entrance >300 ft w/ no AD, close supervision while navigating busy hallways and elevators. Seated rest break was given before continuing navigating outside. Pt ambulated outside down stairs w/ the use of a railing alternating steps, walking on a slant, walking up/down and incline, as well as walking on difference surfaces such as concrete, grass, and brick. Pt experienced no LOB and demo improvements in gait speed, confidence, as well as step length & height. Seated rest break was given once after stair navigation and prior to walking back upstairs. Pt given education about therapy after D/C as well as the stroke support group. - stepping over 5 hurdles 6" step over step x7 lengths, close supervision- CGA, 1 minor LOB but good use of steppage strategies to recover - side stepping over 5 hurdles 6" step to step x4 lengths, close supervision-CGA - ambulating with eyes closed 2x3ft HHA on the L for tactile feedback. No LOB but continues to veer to the L. - gym> room w/ no AD supervision  Toileting supervision  while standing at end of session. Mod I for toileting hygiene. Pt continent to bladder.  Pt left at end of session in bed, alarm activated, wife present, all needs met.  Therapy Documentation Precautions:  Precautions Precautions: Fall Restrictions Weight Bearing Restrictions: No  Pain: Session 1: pt report no pain throughout session Session 2: pt reports no pain throughout session  Therapy/Group: Individual Therapy  [name] 10/17/2022, 11:19 AM

## 2022-10-18 DIAGNOSIS — I639 Cerebral infarction, unspecified: Secondary | ICD-10-CM | POA: Diagnosis not present

## 2022-10-18 NOTE — Progress Notes (Signed)
Physical Therapy Session Note  Patient Details  Name: Eric Dickerson MRN: [id number] Date of Birth: [birth year]  Today's Date: 10/18/2022 PT Individual Time: 8295-6213 PT Individual Time Calculation (min): 65 min   Short Term Goals: Week 1:  PT Short Term Goal 1 (Week 1): pt will perform transfers with LRAD and CGA PT Short Term Goal 2 (Week 1): pt will ambulate 157ft with LRAD and CGA PT Short Term Goal 3 (Week 1): pt will navigate 1 small step with LRAD and min A  Skilled Therapeutic Interventions/Progress Updates:    Pt received up in recliner, alarm activated, wife present.  Pt continent of bladder. Supervision for toileting, hygiene, and hand hygiene. At beginning and end of session.  Time taken during session for reassessment of strength, sensation, cognition, stairs, ambulation, bed mobility, and transfers. - independent bed mobility - Supervision no AD for ambulation, stairs, and transfers  Dynamic balance:  - gait training to improve dynamic balance in varied environment > 500 ft outside searching for cones hidden. Supervision, no AD. 1 LOB but able to self correct with a reactionary steppage strategy. Navigating elevators, busy hallways, brick, slants, ramps, concrete, curbs, and stairs. - x22 steps going up alternating pattern, 1 railing on the R, supervision, 1 trip but able to self correct with reactionary steppage strategy. - reactionary fast walking and quick changing directions to colored cones spread out through the main gym. Supervision, no LOB, pt demo good reactionary, ankle, and hip strategies. No vestibular sx noted. - pt turning head to the L tossing basketball onto trampoline, catching it, turning head and calling out color or number of circle dot that SPT or PT calls out. PT placed at ~ 150 degrees anterior from the trampoline & patient, SPT ~10 degrees posterior to patient. X20 - room<> gym w/o AD supervision at beginning and end of session  Pt left sitting on EOB,  alarm activated, all needs met, NT notified of positioning and in agreement.  Therapy Documentation Precautions:  Precautions Precautions: Fall Restrictions Weight Bearing Restrictions: No Pain: Pain Assessment Pain Scale: 0-10 Pain Score: 8  Pain Type: Chronic pain Pain Location: Hip Pain Orientation: Right;Lower Pain Descriptors / Indicators: Tightness;Constant;Discomfort Pain Onset: On-going Pain Intervention(s): RN made aware;Emotional support;Other (Comment) (seated rest breaks provided. STM suggested but pt declined. Pt reports pain not limiting activity.)  Therapy/Group: Individual Therapy  [name] 10/18/2022, 4:52 PM

## 2022-10-18 NOTE — Progress Notes (Addendum)
PROGRESS NOTE   Subjective/Complaints:  No dizziness or pain noted   ROS- neg CP, SOB, N/V/D,+ night sweats   Objective:   No results found. Recent Labs    10/16/22 0703  WBC 8.2  HGB 13.6  HCT 42.4  PLT 293    Recent Labs    10/16/22 0703  NA 138  K 4.0  CL 102  CO2 25  GLUCOSE 108*  BUN 19  CREATININE 1.14  CALCIUM 9.5    Intake/Output Summary (Last 24 hours) at 10/18/2022 0823 Last data filed at 10/18/2022 0746 Gross per 24 hour  Intake 840 ml  Output 750 ml  Net 90 ml        Physical Exam: Vital Signs Blood pressure 132/85, pulse 78, temperature 98.5 F (36.9 C), resp. rate 16, height 6\' 2"  (1.88 m), weight 78.6 kg, SpO2 100%.   General: No acute distress Mood and affect are appropriate Heart: Regular rate and rhythm no rubs murmurs or extra sounds Lungs: Clear to auscultation, breathing unlabored, no rales or wheezes Abdomen: Positive bowel sounds, soft nontender to palpation, nondistended Extremities: No clubbing, cyanosis, or edema Skin: No evidence of breakdown, no evidence of rash  Neurologic: Cranial nerves II through XII intact, motor strength is 5/5 in bilateral deltoid, bicep, tricep, grip, hip flexor, knee extensors, ankle dorsiflexor and plantar flexor Sensory exam normal sensation to light touch  in bilateral upper and lower extremities Musculoskeletal: Full range of motion in all 4 extremities. No joint swelling or tenderness noted Romberg neg  Assessment/Plan: 1. Functional deficits which require 3+ hours per day of interdisciplinary therapy in a comprehensive inpatient rehab setting. Physiatrist is providing close team supervision and 24 hour management of active medical problems listed below. Physiatrist and rehab team continue to assess barriers to discharge/monitor patient progress toward functional and medical goals  Care Tool:  Bathing    Body parts bathed by  patient: Right arm, Left arm, Chest, Abdomen, Front perineal area, Buttocks, Right upper leg, Left upper leg, Right lower leg, Left lower leg, Face         Bathing assist Assist Level: Minimal Assistance - Patient > 75%     Upper Body Dressing/Undressing Upper body dressing   What is the patient wearing?: Pull over shirt    Upper body assist Assist Level: Supervision/Verbal cueing    Lower Body Dressing/Undressing Lower body dressing      What is the patient wearing?: Incontinence brief, Pants     Lower body assist Assist for lower body dressing: Moderate Assistance - Patient 50 - 74%     Toileting Toileting    Toileting assist Assist for toileting: Contact Guard/Touching assist     Transfers Chair/bed transfer  Transfers assist     Chair/bed transfer assist level: Supervision/Verbal cueing     Locomotion Ambulation   Ambulation assist      Assist level: Supervision/Verbal cueing Assistive device: No Device Max distance: 300'   Walk 10 feet activity   Assist     Assist level: Supervision/Verbal cueing Assistive device: No Device   Walk 50 feet activity   Assist    Assist level: Supervision/Verbal cueing Assistive device: No Device  Walk 150 feet activity   Assist Walk 150 feet activity did not occur: Safety/medical concerns (decreased balance/coordination)  Assist level: Supervision/Verbal cueing Assistive device: No Device    Walk 10 feet on uneven surface  activity   Assist     Assist level: Supervision/Verbal cueing Assistive device: Other (comment) (bilateral handrails)   Wheelchair     Assist Is the patient using a wheelchair?: No Type of Wheelchair: Manual    Wheelchair assist level: Supervision/Verbal cueing Max wheelchair distance: 197ft    Wheelchair 50 feet with 2 turns activity    Assist        Assist Level: Supervision/Verbal cueing   Wheelchair 150 feet activity     Assist       Assist Level: Supervision/Verbal cueing   Blood pressure 132/85, pulse 78, temperature 98.5 F (36.9 C), resp. rate 16, height 6\' 2"  (1.88 m), weight 78.6 kg, SpO2 100%.  Medical Problem List and Plan: 1. Functional deficits secondary to infarcts of the left cerebellum likely secondary to left V4 atherosclerotic stenosis versus dissection             -patient may  shower             -ELOS/Goals: 5-7 days, mod I to supervision with PT, OT, SLP-Team conference today please see physician documentation under team conference tab, met with team  to discuss problems,progress, and goals. Formulized individual treatment plan based on medical history, underlying problem and comorbidities.   -Continue CIR PT, OT    2.  Antithrombotics: -DVT/anticoagulation:  Pharmaceutical: Lovenox             -antiplatelet therapy: Aspirin and Plavix for three months followed by aspirin alone  -Vascular ultrasound study with no evidence of DVT bilateral lower extremities   3. Pain Management: Tylenol as needed   4. Mood/Behavior/Sleep: LCSW to evaluate and provide emotional support             -antipsychotic agents: n/a   5. Neuropsych/cognition: This patient is capable of making decisions on his own behalf.   6. Skin/Wound Care: Routine skin care checks   7. Fluids/Electrolytes/Nutrition: Routine Is and Os and follow-up chemistries   8: Urinary retention: continue Flomax   9: Hyperlipidemia: continue Zetia   10: CAD s/p CABG/stents 2014; hx of PAD.  Denies chest pain             -maintained on Plavix due to GERD             -on Zetia (cardiologist encouraged PSK9 or Nexlizet>>lipid clinic follow-up)             -follows with Dr Eden Emms   11: Borderline prediabetes: A1c = 5.7%.  Dietary education  -Monitor glucose on BMPs   12: Hypokalemia: resolved   -9/8 stable at 4.2   13:Leukocytosis: borderline , pt is afeb    Latest Ref Rng & Units 10/16/2022    7:03 AM 10/13/2022    6:12 AM 10/10/2022    11:40 AM  CBC  WBC 4.0 - 10.5 K/uL 8.2  10.4  10.6   Hemoglobin 13.0 - 17.0 g/dL 64.4  03.4  74.2   Hematocrit 39.0 - 52.0 % 42.4  40.2  38.5   Platelets 150 - 400 K/uL 293  256  267       14: Hypertension: monitor TID and prn             -home amlodipine 10 mg held, resume as appropriate  Vitals:   10/17/22 2036 10/18/22 0521  BP: (!) 142/84 132/85  Pulse: 72 78  Resp: 16 16  Temp: 98.5 F (36.9 C) 98.5 F (36.9 C)  SpO2: 100% 100%   BP sl elevated today per d/c summ from acute resume amlodipine, now is 7d post CVA , start 5mg  (home dose is 10mg )  15. Azotemia  -9/8 Cr 1.31/25 continue to encourage him to take oral fluids and monitor     Latest Ref Rng & Units 10/16/2022    7:03 AM 10/15/2022    5:08 AM 10/13/2022    6:12 AM  BMP  Glucose 70 - 99 mg/dL 409  811  914   BUN 8 - 23 mg/dL 19  25  21    Creatinine 0.61 - 1.24 mg/dL 7.82  9.56  2.13   Sodium 135 - 145 mmol/L 138  137  138   Potassium 3.5 - 5.1 mmol/L 4.0  4.2  4.4   Chloride 98 - 111 mmol/L 102  105  102   CO2 22 - 32 mmol/L 25  25  25    Calcium 8.9 - 10.3 mg/dL 9.5  9.4  08.6   Creat now is normal   16.  Dizziness, intermittent  -Reviewed related to cerebellar stroke, will order orthostatic vital signs   LOS: 6 days A FACE TO FACE EVALUATION WAS PERFORMED  Erick Colace 10/18/2022, 8:23 AM

## 2022-10-18 NOTE — Progress Notes (Signed)
Physical Therapy Session Note  Patient Details  Name: Eric Dickerson MRN: [id number] Date of Birth: 1950/07/22  Today's Date: 10/18/2022 PT Individual Time: 1105-1200 PT Individual Time Calculation (min): 55 min   Short Term Goals: Week 1:  PT Short Term Goal 1 (Week 1): pt will perform transfers with LRAD and CGA PT Short Term Goal 2 (Week 1): pt will ambulate 190ft with LRAD and CGA PT Short Term Goal 3 (Week 1): pt will navigate 1 small step with LRAD and min A  Skilled Therapeutic Interventions/Progress Updates: Patient sitting in recliner with friend present on entrance to room. Patient alert and agreeable to PT session.   Patient reported no pain at beginning of session.  Therapeutic Activity: Transfers: Pt performed sit<>stand transfers throughout session with supervision without AD. No VC required.  Gait: Pt ambulated throughout entire session with no AD or LOB (when not doing interventions) and with supervision throughout for safety.  Neuromuscular Re-ed: NMR facilitated during session with focus on VOR, visual tracking, coordination and dynamic standing balance. - VOR with pt requiring max cues and demonstrations to understand mechanics to perform fluidly. Pt started with 50 bpm sitting, but required max cues to avoid stopping briefly at center. Pt then to perform VOR without metronome app in order to break down sequence. Pt eventually able to perform 80 bpm for less than 30 seconds before briefly stopping in middle. Pt noted to have slight slippage in R, but harder to tell as pt had trouble maintaining sequence.Pt reported mild unsteadiness when performing in standing, but tolerable. Pt also reported that it was difficult to maintain eye on target (paper tacked to board with black dot at eye level), and required max cues to keep chin level vs having head in extension.  - pt to trow bean bags on hi/low mat elevated to tallest setting while making 180 degree turn to cones in order to  work on visual tracking with fast movements. Pt noted to be able to notice where his error was (looking and tossing bags down). Pt performed 2nd round with underhand toss and was able to knock all cones. Pt performed throughout with supervision and no LOB - Pt ambulated to N tower with CGA with cues to make head turns cued by PTA. Pt then ambulated with eyes closed and minA to avoid running in to wall on L, and to maintain standing balance as pt looses balance to the L more than the R. Pt also cued to take quick pivot turns, and to ambulate backwards with CGA/minA for safety.   NMR performed for improvements in motor control and coordination, balance, sequencing, judgement, and self confidence/ efficacy in performing all aspects of mobility at highest level of independence.   Patient sitting in  recliner at end of session with brakes locked, chair alarm set, and all needs within reach.      Therapy Documentation Precautions:  Precautions Precautions: Fall Restrictions Weight Bearing Restrictions: No   Therapy/Group: Individual Therapy  Khalifa Knecht PTA 10/18/2022, 12:19 PM

## 2022-10-18 NOTE — Progress Notes (Signed)
Occupational Therapy Session Note  Patient Details  Name: Eric Dickerson MRN: [id number] Date of Birth: [birth year]  Today's Date: 10/18/2022 OT Individual Time: 1330-1425 OT Individual Time Calculation (min): 55 min    Short Term Goals: Week 1:  OT Short Term Goal 1 (Week 1): Pt able to stand with supervision to perform 2 grooming tasks OT Short Term Goal 2 (Week 1): Pt perform transfer on/ off toilet or void in standing wtih supervision OT Short Term Goal 3 (Week 1): Pt would be able to stand for showering with supervision  Skilled Therapeutic Interventions/Progress Updates:  Pt received sitting in recliner for skilled OT session with focus on functional mobility, dynamic standing balance, and activity tolerance. Pt agreeable to interventions, demonstrating overall pleasant mood. Pt with un-rated arthritic pain in R-hip and L-knee. OT offering intermediate rest breaks and positioning suggestions throughout session to address pain/fatigue and maximize participation/safety in session.   Pt completes 3/3 toileting activities with distant supervision, standing to void, see flowsheets. Pt ambulates from room<>main therapy gym with close supervision + no AD. In main therapy gym, pt participates in dynamic standing balance activity, as he is tasked to step onto/off of airex mat to place horse-shoes onto mirror. Pt with one instance of L lateral LOB requiring Min A to recover, cuing provided for safe pacing and increased attention to LLE. Pt completes x 3 rounds of activity with overall CGA + no AD.   Activity transitioned to use of 3 in step for step overs to simulate household-level thresholds, activity upgraded with the addition of unilateral weight to further simulate carrying items in/out of the home. Pt requires no more than CGA + No AD, no instance of LOB.   Pt ambulates back to room balancing a cup of water on a peg board for increased balance challenge, CGA.   Pt remained sitting in recliner  with all immediate needs met at end of session. Pt continues to be appropriate for skilled OT intervention to promote further functional independence.   Therapy Documentation Precautions:  Precautions Precautions: Fall Restrictions Weight Bearing Restrictions: No   Therapy/Group: Individual Therapy  Lou Cal, OTR/L, MSOT  10/18/2022, 6:29 AM

## 2022-10-18 NOTE — Progress Notes (Signed)
Occupational Therapy Session Note  Patient Details  Name: Eric Dickerson MRN: [id number] Date of Birth: Nov 22, 1950  Today's Date: 10/18/2022 OT Individual Time: 0630-1601 OT Individual Time Calculation (min): 75 min    Short Term Goals: Week 1:  OT Short Term Goal 1 (Week 1): Pt able to stand with supervision to perform 2 grooming tasks OT Short Term Goal 2 (Week 1): Pt perform transfer on/ off toilet or void in standing wtih supervision OT Short Term Goal 3 (Week 1): Pt would be able to stand for showering with supervision  Skilled Therapeutic Interventions/Progress Updates:     Pt received sitting up in recliner dressed and ready for the day upon OT arrival with all BADL needs met. Pt presenting to be in good spirits receptive to skilled OT session reporting 0/10 pain- OT offering intermittent rest breaks, repositioning, and therapeutic support to optimize participation in therapy session. Pt reporting he did not sleep well previous night and may have slept a total of 3 hours- educated on sleep hygiene with Pt receptive to education. Focus this session IADL retraining, dynamic standing balance, activity tolerance, and reaction time.   Pt completed functional mobility to ADL apartment no AD, navigating through busy hallway, with close supervision without LOB.   Provided education on fall prevention and energy conservation techniques and engaged Pt in conversation applying these topics to home environment and completing BADL/IADL tasks. Pt able to verbalize 3 strategies to apply at home including taking increased rest breaks, completing tasks sitting down, and prioritizing activities to complete during the day demonstrating teach back as evidence of learning. Engaged Pt in simulated household management activities sweeping and mopping kitchen floor with pt able to complete activities with close supervision no LOB. Pt implemented energy conservation techniques taking single rest break during  activities.   Pt completed functional mobility to therapy gym no AD close supervision. Pt reporting mild dizziness following. Assessed pt's vitals in sitting: BP 142/97 (112) HR 108 PSO2 100%  Engaged Pt in simulated home and community navigation activity with Pt instructed to navigate around obstacles while balancing a cup of water in each hand to simulate navigating in home while carrying items. Pt able to ambulate ~5 minutes with close supervision no LOB. Pt reporting fatigue at end of activity with seated rest break provided. Vitals assessed in sitting: BP 152/100 (117) HR 108 PSO2 100% Reassessed vitals after ~4 minute rest break: BP 143/99 (112) HR 107 PSO2 100%   Engaged Pt in visual scanning activity on BITS. Pt completed visual scanning single target user paced activity in standing while standing on compliant surface to address Pt's reaction time and dynamic standing balance deficits. Pt able to maintain balance during activity with CGA for safety with mild LOB during activity, however Pt able to correct LOB demonstrating improved protective responses. Pt completed activity in 2 minutes with 94.05% accuracy with reaction time of 1.51 sec.  Engaged pt in completing star stepping activity in standing to work on protective responses, reaction time, dynamic balance, and activity tolerance. Pt instructed to step R/L LE to specified color disks on the floor- weight shifting outside his BOS and then stepping back to center of circle to return to midline. Pt able to complete task with CGA to min A to correct balance with biggest challenge noted when stepping R/L foot to back with min-mod LOB present.   Vitals assessed immediately following physical activity BP 163/98 (119) HR 115 PSO2 100% Vitals assessed following ~4 minutes of rest:  BP 147/103 (118) HR 111 Pso2 100%  Pt completed functional mobility back to room no AD close supervision. Pt requesting BR. Pt stood at toilet to void with close  supervision no LOB. Pt was left resting in recliner with call bell in reach, chair alarm on, and all needs met.    Therapy Documentation Precautions:  Precautions Precautions: Fall Restrictions Weight Bearing Restrictions: No  Therapy/Group: Individual Therapy  [name] 10/18/2022, 7:52 AM

## 2022-10-18 NOTE — Progress Notes (Signed)
Physical Therapy Discharge Summary  Patient Details  Name: Eric Dickerson MRN: [id number] Date of Birth: [birth year]  Date of Discharge from PT service:{Time; dates multiple:304500300}  {CHL IP REHAB PT TIME CALCULATION:304800500}   Patient has met {NUMBERS 0-12:18577} of {NUMBERS 0-12:18577} long term goals due to {due SN:0539767}.  Patient to discharge at [facility] level {LOA:3049010}.   Patient's care partner {care partner:3041650} to provide the necessary {assistance:3041652} assistance at discharge.  Reasons goals not met: ***  Recommendation:  Patient will benefit from ongoing skilled PT services in {setting:3041680} to continue to advance safe functional mobility, address ongoing impairments in ***, and minimize fall risk.  Equipment: {equipment:3041657}  Reasons for discharge: {Reason for discharge:3049018}  Patient/family agrees with progress made and goals achieved: {Pt/Family agree with progress/goals:3049020}  PT Discharge Precautions/Restrictions   Vital Signs Therapy Vitals Temp: 98.4 F (36.9 C) Temp Source: Oral Pulse Rate: 81 Resp: 18 BP: 134/88 Patient Position (if appropriate): Sitting Oxygen Therapy SpO2: 100 % O2 Device: Room Air Pain Pain Assessment Pain Scale: 0-10 Pain Score: 8  Pain Type: Chronic pain Pain Location: Hip Pain Orientation: Right;Lower Pain Descriptors / Indicators: Tightness;Constant;Discomfort Pain Onset: On-going Pain Interference Pain Interference Pain Effect on Sleep: 0. Does not apply - I have not had any pain or hurting in the past 5 days Pain Interference with Therapy Activities: 1. Rarely or not at all Pain Interference with Day-to-Day Activities: 1. Rarely or not at all Vision/Perception  Vision - History Ability to See in Adequate Light: 1 Impaired Vision - Assessment Eye Alignment: Within Functional Limits Ocular Range of Motion: Within Functional Limits Alignment/Gaze Preference: Within Defined  Limits Saccades: Within functional limits Convergence: Within functional limits Perception Perception: Within Functional Limits Praxis Praxis: WFL  Cognition Overall Cognitive Status: Within Functional Limits for tasks assessed Arousal/Alertness: Awake/alert Orientation Level: Oriented X4 Year: 2024 Month: September Day of Week: Correct Attention: Focused Focused Attention: Appears intact Sustained Attention: Appears intact Selective Attention: Appears intact Memory: Appears intact Awareness: Appears intact Problem Solving: Appears intact Reasoning: Appears intact Self Monitoring: Appears intact Self Correcting: Appears intact Safety/Judgment: Appears intact Sensation Sensation Light Touch: Appears Intact Hot/Cold: Not tested Proprioception:  (***) Stereognosis: Not tested Coordination Gross Motor Movements are Fluid and Coordinated: Yes Fine Motor Movements are Fluid and Coordinated: Yes Coordination and Movement Description: ****** Motor     Mobility   Locomotion     Trunk/Postural Assessment     Balance   Extremity Assessment            [name] 10/18/2022, 4:53 PM

## 2022-10-18 NOTE — Patient Care Conference (Signed)
Inpatient RehabilitationTeam Conference and Plan of Care Update Date: 10/18/2022   Time: 10:33 AM    Patient Name: Eric Dickerson      Medical Record Number: [id number]  Date of Birth: [birth year] Sex: Male         Room/Bed: 4M03C/4M03C-01 Payor Info: Payor: HU[employer] MEDICARE / Plan: HU[employer] MEDICARE CHOICE PPO / Product Type: *No Product type* /    Admit Date/Time:  10/12/2022  4:36 PM  Primary Diagnosis:  CVA (cerebral vascular accident) [facility])  Hospital Problems: Principal Problem:   CVA (cerebral vascular accident) [facility])    Expected Discharge Date: Expected Discharge Date: 10/20/22  Team Members Present: Physician leading conference: Dr. Claudette Laws Social Worker Present: Lavera Guise, BSW Nurse Present: Chana Bode, RN PT Present: Casimiro Needle, PT OT Present: Bonnell Public, OT SLP Present: Everardo Pacific, SLP PPS Coordinator present : Fae Pippin, SLP     Current Status/Progress Goal Weekly Team Focus  Bowel/Bladder   continent of bowel has some accidents at night for bladder   complete continence of b/b   offer toileting qshift and PRN ensure urinal is close to patient at night    Swallow/Nutrition/ Hydration               ADL's   Superivsion UB BADLs, close supervision-CGA LB BADLs, close supervision-CGA functional transfers no AD to toilet/walk-in shower, close supervision-CGA toileting, CGA simple IADLs; Barriers- decreased activity tolerance, balance, elevated HR/BP   mod I   Dynamic standing balance, BP assessment, functional transfer training, BADL retraining, Pt education, balance training, IADL retraining, activity tolerance    Mobility   Supervision bed mobility, CGA & no AD for ambulation/Bed to chair/stairs. Barriers: reactionary balance   Supervision w/ LRAD for bed mobility, ambulation, bed to chair, and stairs using 1 HR  Ambulation on different surface levels and dynamic balance without AD.    Communication                 Safety/Cognition/ Behavioral Observations               Pain   no c/o pain   remain pain free   assess for pain qshift and PRN    Skin   skin is intact   maintain skin integrity  assess skin for breakdown and encourage q2hr turns at night and increased ambulation during the day.      Discharge Planning:  Discharging home with spouse and daughter to assist. Spouse works during the day and daughter [facility] . 24/7 avaliable . Patient has a RW.   Team Discussion: Patient medically stable post CVA; hypofunctional left ear and hx. of vertigo. Limited by poor activity tolerance  Patient on target to meet rehab goals: yes, currently needs supervision for upper body care and CGA for lower body care with supervision for transfers and toileting. CGA for IADLs.  Goals for discharge set for supervision - mod I assist.  *See Care Plan and progress notes for long and short-term goals.   Revisions to Treatment Plan:  Working on reactionary balance with dynamic gait   Teaching Needs: Safety, transfers, toileting, medications and dietary modifications, etc.   Current Barriers to Discharge: Decreased caregiver support  Possible Resolutions to Barriers: Family education OP follow up services  Follow-up with [facility] in 6 to 8 weeks for stroke  Follow-up with PCP for hypertension within 1 week of discharge from rehab     Medical Summary Current Status: Occ tachycardia,as well as HTN  Possible Resolutions to [employer] Focus: medication management of tachycardia and HTN   Continued Need for Acute Rehabilitation Level of Care: The patient requires daily medical management by a physician with specialized training in physical medicine and rehabilitation for the following reasons: Direction of a multidisciplinary physical rehabilitation program to maximize functional independence : Yes Medical management of patient stability for increased activity during  participation in an intensive rehabilitation regime.: Yes Analysis of laboratory values and/or radiology reports with any subsequent need for medication adjustment and/or medical intervention. : Yes   I attest that I was present, lead the team conference, and concur with the assessment and plan of the team.   Chana Bode B 10/18/2022, 3:15 PM

## 2022-10-18 NOTE — Progress Notes (Signed)
Patient ID: Eric Dickerson, male   DOB: 05-06-1950, 72 y.o.   MRN: [id number]  Team Conference Report to Patient/Family  Team Conference discussion was reviewed with the patient and caregiver, including goals, any changes in plan of care and target discharge date.  Patient and caregiver express understanding and are in agreement.  The patient has a target discharge date of 10/20/22.  Sw met with patient and provided team conference updates.   SW met with patient and provided team conference updates. Patient discharging home with spouse and daughter to provide. supervision. Patient feel as he will be ready by Friday. No additional questions or concerns.  Andria Rhein 10/18/2022, 1:15 PM

## 2022-10-19 ENCOUNTER — Other Ambulatory Visit (HOSPITAL_COMMUNITY): Payer: Self-pay

## 2022-10-19 DIAGNOSIS — I63542 Cerebral infarction due to unspecified occlusion or stenosis of left cerebellar artery: Secondary | ICD-10-CM | POA: Diagnosis not present

## 2022-10-19 DIAGNOSIS — I10 Essential (primary) hypertension: Secondary | ICD-10-CM | POA: Diagnosis not present

## 2022-10-19 MED ORDER — AMLODIPINE BESYLATE 5 MG PO TABS
5.0000 mg | ORAL_TABLET | Freq: Every day | ORAL | 0 refills | Status: DC
  Filled 2022-10-19: qty 30, 30d supply, fill #0

## 2022-10-19 MED ORDER — ASPIRIN 81 MG PO TBEC
81.0000 mg | DELAYED_RELEASE_TABLET | Freq: Every day | ORAL | 0 refills | Status: DC
  Filled 2022-10-19: qty 120, 120d supply, fill #0

## 2022-10-19 MED ORDER — CLOPIDOGREL BISULFATE 75 MG PO TABS
75.0000 mg | ORAL_TABLET | Freq: Every day | ORAL | 0 refills | Status: DC
  Filled 2022-10-19: qty 30, 30d supply, fill #0

## 2022-10-19 NOTE — Group Note (Signed)
Patient Details Name: Eric Dickerson MRN: [id number] DOB: [birth year] Today's Date: 10/19/2022  Time Calculation: OT Group Time Calculation OT Group Start Time: 1430 OT Group Stop Time: 1530 OT Group Time Calculation (min): 60 min      Group Description: Dance Group: Pt participated in dance group with an emphasis on social interaction, motor planning, increasing overall activity tolerance and bimanual tasks. All songs were selected by group members. Dance moves included AROM of BUE/BLE gross motor movements with an emphasis on building functional endurance.    Individual level documentation: Patient completed group from sitting level. Patientt needed supervision to complete various dance moves with OT providing visual model of movements.  Patient able to create his own modifications during group.  Pain: Pain Assessment Pain Scale: 0-10 Pain Score: 0-No pain  Precautions: Precautions: Myna Hidalgo [facility] 10/19/2022, 3:52 PM

## 2022-10-19 NOTE — Plan of Care (Signed)
  Problem: Sit to Stand Goal: LTG:  Patient will perform sit to stand with assistance level (PT) Description: LTG:  Patient will perform sit to stand with assistance level (PT) Outcome: Completed/Met   Problem: RH Bed Mobility Goal: LTG Patient will perform bed mobility with assist (PT) Description: LTG: Patient will perform bed mobility with assistance, with/without cues (PT). Outcome: Completed/Met   Problem: RH Bed to Chair Transfers Goal: LTG Patient will perform bed/chair transfers w/assist (PT) Description: LTG: Patient will perform bed to chair transfers with assistance (PT). Outcome: Completed/Met   Problem: RH Car Transfers Goal: LTG Patient will perform car transfers with assist (PT) Description: LTG: Patient will perform car transfers with assistance (PT). Outcome: Completed/Met   Problem: RH Stairs Goal: LTG Patient will ambulate up and down stairs w/assist (PT) Description: LTG: Patient will ambulate up and down # of stairs with assistance (PT) Outcome: Completed/Met

## 2022-10-19 NOTE — Progress Notes (Signed)
Inpatient Rehabilitation Care Coordinator Discharge Note   Patient Details  Name: Eric Dickerson MRN: [id number] Date of Birth: 03/18/1950   Discharge location: Home  Length of Stay: 7 Days  Discharge activity level: Supervsion  Home/community participation: Spouse and daughter  Patient response ZD:GUYQIH Literacy - How often do you need to have someone help you when you read instructions, pamphlets, or other written material from your doctor or pharmacy?: Never  Patient response KV:[id number] Isolation - How often do you feel lonely or isolated from those around you?: Never  Services provided included: SW, Pharmacy, TR, CM, RN, SLP, OT, PT, RD, MD  Financial Services:  Financial Services Utilized: Private Insurance [employer]  Choices offered to/list presented to: patient  Follow-up services arranged:  Outpatient, DME    Outpatient Servicies: Neuro OP PT OT DME : none    Patient response to transportation need: Is the patient able to respond to transportation needs?: Yes In the past 12 months, has lack of transportation kept you from medical appointments or from getting medications?: No In the past 12 months, has lack of transportation kept you from meetings, work, or from getting things needed for daily living?: No   Patient/Family verbalized understanding of follow-up arrangements:  Yes  Individual responsible for coordination of the follow-up plan: self or spouse  Confirmed correct DME delivered: Andria Rhein 10/19/2022    Comments (or additional information):  Summary of Stay    Date/Time Discharge Planning CSW  10/17/22 1431 Discharging home with spouse and daughter to assist. Spouse works during the day and daughter [facility] . 24/7 avaliable . Patient has a RW. CJB       Andria Rhein

## 2022-10-19 NOTE — Progress Notes (Signed)
Occupational Therapy Session Note  Patient Details  Name: Eric Dickerson MRN: [id number] Date of Birth: 15-Mar-1950  Today's Date: 10/19/2022 OT Individual Time: 6269-4854 OT Individual Time Calculation (min): 56 min    Short Term Goals: Week 1:  OT Short Term Goal 1 (Week 1): Pt able to stand with supervision to perform 2 grooming tasks OT Short Term Goal 2 (Week 1): Pt perform transfer on/ off toilet or void in standing wtih supervision OT Short Term Goal 3 (Week 1): Pt would be able to stand for showering with supervision  Skilled Therapeutic Interventions/Progress Updates:      Therapy Documentation Precautions:  Precautions Precautions: Fall Restrictions Weight Bearing Restrictions: No General: "I feel really good about everything." Pt seated in recliner upon OT arrival, agreeable to OT. Pt and OT discussed D/C, pt reports no concerns for D/C.   Pain: no pain reported  ADL: Grooming: mod I  Toilet transfer: mod I ambulating with no AD  UB dressing: mod I doffing/donning overhead shirt LB dressing: mod I doffing/donning pants/underwear Footwear: mod I for donning/doffing socks/shoes Shower transfer: mod I with no AD ambulation   Bathing: mod I seated on TTB/standing in shower Transfers: mod I overall for all functional mobility/transfers  Other Treatments: Pt was able to retrieve clothes throughout room before ADL, reaching out of BOS downward into drawers. Pt completing light housekeeping, placing clothes into dirty clothes bag into bottom drawer  Pt issued UE theraband HEP in order to increase functional strength, andurance and activity tolerance in order to increase independence in ADLs such as bathing. Pt issued blue and green theraband and discussed direction/technique of exercises, demonstrating verbal understanding. Pt exercises listed below: -elbow extensions - shoulder horizontal abduction -bicep curls  -shoulder flexion -diagonal shoulder flexion -external  rotation   Pt seated in recliner at end of session, no alarm d/t Mod I status in room, call light within reach and 4Ps assessed.    Therapy/Group: Individual Therapy  Velia Meyer, OTD, OTR/L 10/19/2022, 11:16 AM

## 2022-10-19 NOTE — Progress Notes (Signed)
Inpatient Rehabilitation Discharge Medication Review by a Pharmacist  A complete drug regimen review was completed for this patient to identify any potential clinically significant medication issues.  High Risk Drug Classes Is patient taking? Indication by Medication  Antipsychotic No   Anticoagulant No   Antibiotic No   Opioid No   Antiplatelet Yes Asprin, plavix (pavix EOT 01/09/23, then ASA alone)- cva ppx  Hypoglycemics/insulin No   Vasoactive Medication Yes Flomax- BPH Amlodipine- HTN  NTG PRN- CAD   Chemotherapy No   Other Yes Zetia- HLD Meclizine PRN- dizziness      Type of Medication Issue Identified Description of Issue Recommendation(s)  Drug Interaction(s) (clinically significant)     Duplicate Therapy     Allergy     No Medication Administration End Date     Incorrect Dose     Additional Drug Therapy Needed     Significant med changes from prior encounter (inform family/care partners about these prior to discharge).    Other  New ASA/plavix      Clinically significant medication issues were identified that warrant physician communication and completion of prescribed/recommended actions by midnight of the next day:  No   Time spent performing this drug regimen review (minutes):  30   Jani Gravel, PharmD Clinical Pharmacist  10/20/2022 8:12 AM

## 2022-10-19 NOTE — Discharge Instructions (Addendum)
Inpatient Rehab Discharge Instructions  Eric Dickerson Discharge date and time: 10/20/2022   Activities/Precautions/ Functional Status: Activity: no lifting, driving, or strenuous exercise until cleared by MD Diet: cardiac diet Wound Care: none needed Functional status:  ___ No restrictions     ___ Walk up steps independently ___ 24/7 supervision/assistance   ___ Walk up steps with assistance __x_ Intermittent supervision/assistance  ___ Bathe/dress independently ___ Walk with walker     ___ Bathe/dress with assistance ___ Walk Independently    ___ Shower independently ___ Walk with assistance    __x_ Shower with assistance _x__ No alcohol     ___ Return to work/school ________  Special Instructions:  No driving, alcohol consumption or tobacco use.  Recommend daily BP measurement in same arm and record time of day. Bring this information with you to follow-up appointment with PCP. COMMUNITY REFERRALS UPON DISCHARGE:    Outpatient: PT     OT                Agency: [facility] Phone: [phone]              Appointment Date/Time:TBD   STROKE/TIA DISCHARGE INSTRUCTIONS SMOKING Cigarette smoking nearly doubles your risk of having a stroke & is the single most alterable risk factor  If you smoke or have smoked in the last 12 months, you are advised to quit smoking for your health. Most of the excess cardiovascular risk related to smoking disappears within a year of stopping. Ask you doctor about anti-smoking medications [place] Quit Line: 1-800-QUIT NOW Free Smoking Cessation Classes [phone]  CHOLESTEROL Know your levels; limit fat & cholesterol in your diet  Lipid Panel     Component Value Date/Time   CHOL 222 (H) 10/11/2022 0739   CHOL 196 09/04/2022 0832   TRIG 59 10/11/2022 0739   HDL 58 10/11/2022 0739   HDL 59 09/04/2022 0832   CHOLHDL 3.8 10/11/2022 0739   VLDL 12 10/11/2022 0739   LDLCALC 152 (H) 10/11/2022 0739   LDLCALC 125 (H) 09/04/2022 0832     Many  patients benefit from treatment even if their cholesterol is at goal. Goal: Total Cholesterol (CHOL) less than 160 Goal:  Triglycerides (TRIG) less than 150 Goal:  HDL greater than 40 Goal:  LDL (LDLCALC) less than 100   BLOOD PRESSURE American Stroke Association blood pressure target is less that 120/80 mm/Hg  Your discharge blood pressure is:  BP: 119/74 Monitor your blood pressure Limit your salt and alcohol intake Many individuals will require more than one medication for high blood pressure  DIABETES (A1c is a blood sugar average for last 3 months) Goal HGBA1c is under 7% (HBGA1c is blood sugar average for last 3 months)  Diabetes: Diagnosis of diabetes:  Your A1c:5.7 %    Lab Results  Component Value Date   HGBA1C 5.7 (H) 10/11/2022    Your HGBA1c can be lowered with medications, healthy diet, and exercise. Check your blood sugar as directed by your physician Call your physician if you experience unexplained or low blood sugars.  PHYSICAL ACTIVITY/REHABILITATION Goal is 30 minutes at least 4 days per week  Activity: Increase activity slowly, Therapies: Physical Therapy: Outpatient, Occupational Therapy: Outpatient, and Speech Therapy: Outpatient Return to work: until cleared by MD Activity decreases your risk of heart attack and stroke and makes your heart stronger.  It helps control your weight and blood pressure; helps you relax and can improve your mood. Participate in a regular exercise program.  Talk with your doctor about the best form of exercise for you (dancing, walking, swimming, cycling).  DIET/WEIGHT Goal is to maintain a healthy weight  Your discharge diet is:  Diet Order             Diet Heart Fluid consistency: Thin  Diet effective now                  thin liquids Your height is:  Height: 6\' 2"  (188 cm) Your current weight is: Weight: 78.6 kg Your Body Mass Index (BMI) is:  BMI (Calculated): 22.24 Following the type of diet specifically designed for you  will help prevent another stroke. Your goal weight range is:   Your goal Body Mass Index (BMI) is 19-24. Healthy food habits can help reduce 3 risk factors for stroke:  High cholesterol, hypertension, and excess weight.  RESOURCES Stroke/Support Group:  Call [phone]   STROKE EDUCATION PROVIDED/REVIEWED AND GIVEN TO PATIENT Stroke warning signs and symptoms How to activate emergency medical system (call 911). Medications prescribed at discharge. Need for follow-up after discharge. Personal risk factors for stroke. Pneumonia vaccine given: No Flu vaccine given: No My questions have been answered, the writing is legible, and I understand these instructions.  I will adhere to these goals & educational materials that have been provided to me after my discharge from the hospital.     My questions have been answered and I understand these instructions. I will adhere to these goals and the provided educational materials after my discharge from the hospital.  Patient/Caregiver Signature _______________________________ Date __________  Clinician Signature _______________________________________ Date __________  Please bring this form and your medication list with you to all your follow-up doctor's appointments.

## 2022-10-19 NOTE — Progress Notes (Signed)
Patient ID: Eric Dickerson, male   DOB: 1950-02-27, 72 y.o.   MRN: [id number]  OP recommendation. No DME recommendations for discharge tomorrow.

## 2022-10-19 NOTE — Plan of Care (Signed)
  Problem: RH Balance Goal: LTG Patient will maintain dynamic standing with ADLs (OT) Description: LTG:  Patient will maintain dynamic standing balance with assist during activities of daily living (OT)  Outcome: Completed/Met   Problem: Sit to Stand Goal: LTG:  Patient will perform sit to stand in prep for activites of daily living with assistance level (OT) Description: LTG:  Patient will perform sit to stand in prep for activites of daily living with assistance level (OT) Outcome: Completed/Met   Problem: RH Bathing Goal: LTG Patient will bathe all body parts with assist levels (OT) Description: LTG: Patient will bathe all body parts with assist levels (OT) Outcome: Completed/Met   Problem: RH Dressing Goal: LTG Patient will perform upper body dressing (OT) Description: LTG Patient will perform upper body dressing with assist, with/without cues (OT). Outcome: Completed/Met Goal: LTG Patient will perform lower body dressing w/assist (OT) Description: LTG: Patient will perform lower body dressing with assist, with/without cues in positioning using equipment (OT) Outcome: Completed/Met   Problem: RH Toileting Goal: LTG Patient will perform toileting task (3/3 steps) with assistance level (OT) Description: LTG: Patient will perform toileting task (3/3 steps) with assistance level (OT)  Outcome: Completed/Met   Problem: RH Simple Meal Prep Goal: LTG Patient will perform simple meal prep w/assist (OT) Description: LTG: Patient will perform simple meal prep with assistance, with/without cues (OT). Outcome: Completed/Met   Problem: RH Toilet Transfers Goal: LTG Patient will perform toilet transfers w/assist (OT) Description: LTG: Patient will perform toilet transfers with assist, with/without cues using equipment (OT) Outcome: Completed/Met   Problem: RH Tub/Shower Transfers Goal: LTG Patient will perform tub/shower transfers w/assist (OT) Description: LTG: Patient will perform  tub/shower transfers with assist, with/without cues using equipment (OT) Outcome: Completed/Met   

## 2022-10-19 NOTE — Progress Notes (Signed)
PROGRESS NOTE   Subjective/Complaints:  No new concerns this morning.   ROS- neg CP, SOB, N/V/D, fevers.  No new motor or sensory changes.   Objective:   No results found. No results for input(s): "WBC", "HGB", "HCT", "PLT" in the last 72 hours.   No results for input(s): "NA", "K", "CL", "CO2", "GLUCOSE", "BUN", "CREATININE", "CALCIUM" in the last 72 hours.   Intake/Output Summary (Last 24 hours) at 10/19/2022 1034 Last data filed at 10/19/2022 0730 Gross per 24 hour  Intake 700 ml  Output 1000 ml  Net -300 ml        Physical Exam: Vital Signs Blood pressure 102/86, pulse (!) 102, temperature 98.7 F (37.1 C), temperature source Oral, resp. rate 18, height 6\' 2"  (1.88 m), weight 78.6 kg, SpO2 100%.   General: No acute distress Mood and affect are appropriate Heart: Regular rate and rhythm no rubs murmurs or extra sounds Lungs: Clear to auscultation, breathing unlabored, no rales or wheezes Abdomen: Positive bowel sounds, soft nontender to palpation, nondistended Extremities: No clubbing, cyanosis, or edema Skin: No evidence of breakdown, no evidence of rash  Neurologic: Alert and awake, follows commands, moving all extremities to gravity and resistance Sensory exam normal sensation to light touch  in bilateral upper and lower extremities Musculoskeletal: Full range of motion in all 4 extremities. No joint swelling or tenderness noted Romberg neg  Assessment/Plan: 1. Functional deficits which require 3+ hours per day of interdisciplinary therapy in a comprehensive inpatient rehab setting. Physiatrist is providing close team supervision and 24 hour management of active medical problems listed below. Physiatrist and rehab team continue to assess barriers to discharge/monitor patient progress toward functional and medical goals  Care Tool:  Bathing    Body parts bathed by patient: Right arm, Left arm, Chest,  Abdomen, Front perineal area, Buttocks, Right upper leg, Left upper leg, Right lower leg, Left lower leg, Face         Bathing assist Assist Level: Supervision/Verbal cueing     Upper Body Dressing/Undressing Upper body dressing   What is the patient wearing?: Pull over shirt    Upper body assist Assist Level: Independent with assistive device    Lower Body Dressing/Undressing Lower body dressing      What is the patient wearing?: Incontinence brief, Pants     Lower body assist Assist for lower body dressing: Supervision/Verbal cueing     Toileting Toileting    Toileting assist Assist for toileting: Supervision/Verbal cueing     Transfers Chair/bed transfer  Transfers assist     Chair/bed transfer assist level: Supervision/Verbal cueing     Locomotion Ambulation   Ambulation assist      Assist level: Supervision/Verbal cueing Assistive device: No Device Max distance: 300'   Walk 10 feet activity   Assist     Assist level: Supervision/Verbal cueing Assistive device: No Device   Walk 50 feet activity   Assist    Assist level: Supervision/Verbal cueing Assistive device: No Device    Walk 150 feet activity   Assist Walk 150 feet activity did not occur: Safety/medical concerns (decreased balance/coordination)  Assist level: Supervision/Verbal cueing Assistive device: No Device  Walk 10 feet on uneven surface  activity   Assist     Assist level: Supervision/Verbal cueing Assistive device: Other (comment) (bilateral handrails)   Wheelchair     Assist Is the patient using a wheelchair?: No Type of Wheelchair: Manual    Wheelchair assist level: Supervision/Verbal cueing Max wheelchair distance: 180ft    Wheelchair 50 feet with 2 turns activity    Assist        Assist Level: Supervision/Verbal cueing   Wheelchair 150 feet activity     Assist      Assist Level: Supervision/Verbal cueing   Blood pressure  102/86, pulse (!) 102, temperature 98.7 F (37.1 C), temperature source Oral, resp. rate 18, height 6\' 2"  (1.88 m), weight 78.6 kg, SpO2 100%.  Medical Problem List and Plan: 1. Functional deficits secondary to infarcts of the left cerebellum likely secondary to left V4 atherosclerotic stenosis versus dissection             -patient may  shower             -ELOS/Goals: 5-7 days, mod I to supervision with PT, OT, SLP-Team conference today please see physician documentation under team conference tab, met with team  to discuss problems,progress, and goals. Formulized individual treatment plan based on medical history, underlying problem and comorbidities.   -Continue CIR PT, OT  -Estimated discharge tomorrow 9/13    2.  Antithrombotics: -DVT/anticoagulation:  Pharmaceutical: Lovenox             -antiplatelet therapy: Aspirin and Plavix for three months followed by aspirin alone  -Vascular ultrasound study with no evidence of DVT bilateral lower extremities   3. Pain Management: Tylenol as needed   4. Mood/Behavior/Sleep: LCSW to evaluate and provide emotional support             -antipsychotic agents: n/a   5. Neuropsych/cognition: This patient is capable of making decisions on his own behalf.   6. Skin/Wound Care: Routine skin care checks   7. Fluids/Electrolytes/Nutrition: Routine Is and Os and follow-up chemistries   8: Urinary retention: continue Flomax   9: Hyperlipidemia: continue Zetia   10: CAD s/p CABG/stents 2014; hx of PAD.  Denies chest pain             -maintained on Plavix due to GERD             -on Zetia (cardiologist encouraged PSK9 or Nexlizet>>lipid clinic follow-up)             -follows with Dr Eden Emms   11: Borderline prediabetes: A1c = 5.7%.  Dietary education  -Monitor glucose on BMPs   12: Hypokalemia: resolved   -9/8 stable at 4.2   13:Leukocytosis: borderline , pt is afeb    Latest Ref Rng & Units 10/16/2022    7:03 AM 10/13/2022    6:12 AM 10/10/2022    11:40 AM  CBC  WBC 4.0 - 10.5 K/uL 8.2  10.4  10.6   Hemoglobin 13.0 - 17.0 g/dL 16.1  09.6  04.5   Hematocrit 39.0 - 52.0 % 42.4  40.2  38.5   Platelets 150 - 400 K/uL 293  256  267       14: Hypertension: monitor TID and prn             -home amlodipine 10 mg held, resume as appropriate     Vitals:   10/19/22 0411 10/19/22 0917  BP: 115/78 102/86  Pulse: 77 (!) 102  Resp: 18  Temp: 98.7 F (37.1 C)   SpO2: 100%    BP sl elevated today per d/c summ from acute resume amlodipine, now is 7d post CVA , start 5mg  (home dose is 10mg )  -9/12 BP control appears improved 15. Azotemia  -9/8 Cr 1.31/25 continue to encourage him to take oral fluids and monitor      Latest Ref Rng & Units 10/16/2022    7:03 AM 10/15/2022    5:08 AM 10/13/2022    6:12 AM  BMP  Glucose 70 - 99 mg/dL 161  096  045   BUN 8 - 23 mg/dL 19  25  21    Creatinine 0.61 - 1.24 mg/dL 4.09  8.11  9.14   Sodium 135 - 145 mmol/L 138  137  138   Potassium 3.5 - 5.1 mmol/L 4.0  4.2  4.4   Chloride 98 - 111 mmol/L 102  105  102   CO2 22 - 32 mmol/L 25  25  25    Calcium 8.9 - 10.3 mg/dL 9.5  9.4  78.2   Creat now is normal   16.  Dizziness, intermittent  -Reviewed related to cerebellar stroke, will order orthostatic vital signs   LOS: 7 days A FACE TO FACE EVALUATION WAS PERFORMED  Fanny Dance 10/19/2022, 10:34 AM

## 2022-10-19 NOTE — Progress Notes (Signed)
Occupational Therapy Discharge Summary  Patient Details  Name: Eric Dickerson MRN: [id number] Date of Birth: Jul 08, 1950  Date of Discharge from OT service:October 19, 2022  Today's Date: 10/19/2022 OT Individual Time: 1300-1345 OT Individual Time Calculation (min): 45 min    Patient has met 9 of 9 long term goals due to improved activity tolerance, improved balance, postural control, ability to compensate for deficits, improved attention, improved awareness, and improved coordination.  Patient to discharge at overall Modified Independent level.  Patient's care partner is independent to provide the necessary physical assistance at discharge.    Reasons goals not met: All goals met  Recommendation:  Patient will benefit from ongoing skilled OT services in outpatient setting to continue to advance functional skills in the area of iADL, Vocation, and Reduce care partner burden.  Equipment: No equipment provided  Reasons for discharge: treatment goals met and discharge from hospital  Patient/family agrees with progress made and goals achieved: Yes  OT Discharge Skilled Therapeutic Interventions/progress updates: Pt received sitting up in recliner with all BADL needs met prior to OT arrival. Pt presenting to be in good spirits receptive to skilled OT session reporting 0/10 pain- OT offering intermittent rest breaks, repositioning, and therapeutic support to optimize participation in therapy session. Spent time at beginning of therapy session reassessing Pt's cognition, sensation, vision, MMT, balance, and coordination. Pt completed functional mobility to therapy gym no AD mod I. Engaged Pt in dynamic balance activities with dual tasking challenge of forward/backwards walking and side stepping while tossing ball back and forth with RT, Tracey, to work on reaction time, protective responses, and dynamic balance. Pt able to complete task with close supervision for safety with increased challenge  note during backwards walking with intermittent CGA provided. Pt without LOB during activity demonstrating improved protective stepping and righting reactions. Pt completed functional mobility back to his room no AD. Pt was left resting in recliner with call bell in reach and all needs met.   Precautions/Restrictions  Precautions Precautions: Fall Restrictions Weight Bearing Restrictions: No Pain Pain Assessment Pain Scale: 0-10 Pain Score: 0-No pain ADL ADL Eating: Modified independent Grooming: Modified independent Upper Body Bathing: Modified independent Where Assessed-Upper Body Bathing: Shower Lower Body Bathing: Modified independent Where Assessed-Lower Body Bathing: Shower Upper Body Dressing: Modified independent (Device) Where Assessed-Upper Body Dressing: Edge of bed Lower Body Dressing: Modified independent Where Assessed-Lower Body Dressing: Standing at sink, Sitting at sink Toileting: Modified independent Where Assessed-Toileting: Teacher, adult education: Engineer, agricultural Method: Insurance claims handler: Modified independent Web designer Method: Ship broker: Insurance underwriter: Modified independent Film/video editor Method: Ambulating ADL Comments: able to complete retrieving clothes, reaching out of BOS into drawers Vision Baseline Vision/History: 1 Wears glasses Patient Visual Report: Other (comment) (blurry vision from eval has resolved) Eye Alignment: Within Functional Limits Ocular Range of Motion: Within Functional Limits Alignment/Gaze Preference: Within Defined Limits Saccades: Within functional limits Convergence: Within functional limits Cognition Cognition Overall Cognitive Status: Within Functional Limits for tasks assessed Arousal/Alertness: Awake/alert Orientation Level: Person;Place;Situation Person: Oriented Place: Oriented Situation: Oriented Memory: Appears  intact Memory Impairment: Storage deficit;Retrieval deficit;Decreased recall of new information;Decreased short term memory Decreased Short Term Memory: Verbal basic Attention: Focused Focused Attention: Appears intact Sustained Attention: Appears intact Selective Attention: Appears intact Awareness: Appears intact Problem Solving: Appears intact Reasoning: Appears intact Self Monitoring: Appears intact Self Correcting: Appears intact Safety/Judgment: Appears intact Brief Interview for Mental Status (BIMS) Repetition of Three Words (First Attempt): 3  Temporal Orientation: Year: Correct Temporal Orientation: Month: Accurate within 5 days Temporal Orientation: Day: Correct Recall: "Sock": Yes, no cue required Recall: "Blue": Yes, no cue required Recall: "Bed": Yes, no cue required BIMS Summary Score: 15 Sensation Sensation Light Touch: Appears Intact Hot/Cold: Not tested Proprioception: Appears Intact Stereognosis: Not tested Coordination Gross Motor Movements are Fluid and Coordinated: Yes Fine Motor Movements are Fluid and Coordinated: Yes Coordination and Movement Description: slight deficits with dysdiadokokinesia Finger Nose Finger Test: WFL but slow, improvement from evaluation Motor  Motor Motor: Ataxia Motor - Skilled Clinical Observations: mild ataxia, visual impairments, decreased balance/coordination Motor - Discharge Observations: mild ataxia, mild decreased coodination during functional mobilty Mobility  Bed Mobility Bed Mobility: Rolling Right;Right Sidelying to Sit;Supine to Sit;Sitting - Scoot to [place] of Bed;Sit to Sidelying Right;Sit to Supine Rolling Right: Independent Right Sidelying to Sit: Independent Supine to Sit: Independent Sitting - Scoot to Edge of Bed: Independent Sit to Supine: Independent Sit to Sidelying Right: Independent Transfers Sit to Stand: Independent Stand to Sit: Independent  Trunk/Postural Assessment  Cervical  Assessment Cervical Assessment: Within Functional Limits Thoracic Assessment Thoracic Assessment: Within Functional Limits Lumbar Assessment Lumbar Assessment: Within Functional Limits Postural Control Postural Control: Within Functional Limits Righting Reactions: improved reactionary strategies Protective Responses: delayed and inadequate  Balance Balance Balance Assessed: Yes Standardized Balance Assessment Standardized Balance Assessment: Functional Gait Assessment Static Sitting Balance Static Sitting - Balance Support: Feet supported;No upper extremity supported Static Sitting - Level of Assistance: 7: Independent Dynamic Sitting Balance Dynamic Sitting - Balance Support: Feet supported;No upper extremity supported Dynamic Sitting - Level of Assistance: 7: Independent Dynamic Sitting - Balance Activities: Lateral lean/weight shifting;Forward lean/weight shifting;Reaching for objects;Reaching across midline;Trunk control activities Static Standing Balance Static Standing - Balance Support: No upper extremity supported;During functional activity Static Standing - Level of Assistance: 7: Independent Static Stance: Eyes closed Static Stance: Eyes Closed: 30 sec Dynamic Standing Balance Dynamic Standing - Balance Support: No upper extremity supported;During functional activity Dynamic Standing - Level of Assistance: 7: Independent Dynamic Standing - Balance Activities: Ball toss;Reaching for weighted objects;Reaching across midline;Reaching for objects;Forward lean/weight shifting;Lateral lean/weight shifting Extremity/Trunk Assessment RUE Assessment RUE Assessment: Within Functional Limits LUE Assessment LUE Assessment: Within Functional Limits General Strength Comments: slightly weaker than the right   [name] 10/19/2022, 1:26 PM

## 2022-10-20 DIAGNOSIS — I63542 Cerebral infarction due to unspecified occlusion or stenosis of left cerebellar artery: Secondary | ICD-10-CM | POA: Diagnosis not present

## 2022-10-20 DIAGNOSIS — I10 Essential (primary) hypertension: Secondary | ICD-10-CM | POA: Diagnosis not present

## 2022-10-20 NOTE — Plan of Care (Signed)
Continue care plan

## 2022-10-20 NOTE — Progress Notes (Signed)
PROGRESS NOTE   Subjective/Complaints:  He is looking forward to going home today.  No new concerns.   ROS- neg CP, SOB, N/V/D, fevers.  No new motor or sensory changes.    Objective:   No results found. No results for input(s): "WBC", "HGB", "HCT", "PLT" in the last 72 hours.   No results for input(s): "NA", "K", "CL", "CO2", "GLUCOSE", "BUN", "CREATININE", "CALCIUM" in the last 72 hours.   Intake/Output Summary (Last 24 hours) at 10/20/2022 1342 Last data filed at 10/20/2022 0734 Gross per 24 hour  Intake 716 ml  Output --  Net 716 ml        Physical Exam: Vital Signs Blood pressure 119/74, pulse 75, temperature 98.6 F (37 C), resp. rate 16, height 6\' 2"  (1.88 m), weight 78.6 kg, SpO2 100%.   General: No acute distress Mood and affect are appropriate Heart: Regular rate and rhythm no rubs murmurs or extra sounds Lungs: Clear to auscultation, breathing unlabored, no rales or wheezes Abdomen: Positive bowel sounds, soft nontender to palpation, nondistended Extremities: No clubbing, cyanosis, or edema Skin: No evidence of breakdown, no evidence of rash  Neurologic: Alert and awake, follows commands, moving all extremities to gravity and resistance Sensory exam normal sensation to light touch  in bilateral upper and lower extremities Musculoskeletal: Full range of motion in all 4 extremities. No joint swelling or tenderness noted Romberg neg  Assessment/Plan: 1. Functional deficits which require 3+ hours per day of interdisciplinary therapy in a comprehensive inpatient rehab setting. Physiatrist is providing close team supervision and 24 hour management of active medical problems listed below. Physiatrist and rehab team continue to assess barriers to discharge/monitor patient progress toward functional and medical goals  Care Tool:  Bathing    Body parts bathed by patient: Right arm, Left arm, Chest,  Abdomen, Front perineal area, Buttocks, Right upper leg, Left upper leg, Right lower leg, Left lower leg, Face         Bathing assist Assist Level: Independent with assistive device     Upper Body Dressing/Undressing Upper body dressing   What is the patient wearing?: Pull over shirt    Upper body assist Assist Level: Independent with assistive device    Lower Body Dressing/Undressing Lower body dressing      What is the patient wearing?: Incontinence brief, Pants     Lower body assist Assist for lower body dressing: Independent with assitive device     Toileting Toileting    Toileting assist Assist for toileting: Independent with assistive device     Transfers Chair/bed transfer  Transfers assist     Chair/bed transfer assist level: Independent     Locomotion Ambulation   Ambulation assist      Assist level: Independent Assistive device: No Device Max distance: >527ft   Walk 10 feet activity   Assist     Assist level: Independent Assistive device: No Device   Walk 50 feet activity   Assist    Assist level: Independent Assistive device: No Device    Walk 150 feet activity   Assist Walk 150 feet activity did not occur: Safety/medical concerns (decreased balance/coordination)  Assist level: Independent Assistive device: No  Device    Walk 10 feet on uneven surface  activity   Assist     Assist level: Independent Assistive device: Other (comment) (bilateral handrails)   Wheelchair     Assist Is the patient using a wheelchair?: No Type of Wheelchair: Manual    Wheelchair assist level: Supervision/Verbal cueing Max wheelchair distance: 17ft    Wheelchair 50 feet with 2 turns activity    Assist        Assist Level: Supervision/Verbal cueing   Wheelchair 150 feet activity     Assist      Assist Level: Supervision/Verbal cueing   Blood pressure 119/74, pulse 75, temperature 98.6 F (37 C), resp. rate  16, height 6\' 2"  (1.88 m), weight 78.6 kg, SpO2 100%.  Medical Problem List and Plan: 1. Functional deficits secondary to infarcts of the left cerebellum likely secondary to left V4 atherosclerotic stenosis versus dissection             -patient may  shower             -ELOS/Goals: 5-7 days, mod I to supervision with PT, OT, SLP-Team conference today please see physician documentation under team conference tab, met with team  to discuss problems,progress, and goals. Formulized individual treatment plan based on medical history, underlying problem and comorbidities.   -Continue CIR PT, OT  -Okay for discharge home today    2.  Antithrombotics: -DVT/anticoagulation:  Pharmaceutical: Lovenox             -antiplatelet therapy: Aspirin and Plavix for three months followed by aspirin alone  -Vascular ultrasound study with no evidence of DVT bilateral lower extremities   3. Pain Management: Tylenol as needed   4. Mood/Behavior/Sleep: LCSW to evaluate and provide emotional support             -antipsychotic agents: n/a   5. Neuropsych/cognition: This patient is capable of making decisions on his own behalf.   6. Skin/Wound Care: Routine skin care checks   7. Fluids/Electrolytes/Nutrition: Routine Is and Os and follow-up chemistries   8: Urinary retention: continue Flomax   9: Hyperlipidemia: continue Zetia   10: CAD s/p CABG/stents 2014; hx of PAD.  Denies chest pain             -maintained on Plavix due to GERD             -on Zetia (cardiologist encouraged PSK9 or Nexlizet>>lipid clinic follow-up)             -follows with Dr Eden Emms   11: Borderline prediabetes: A1c = 5.7%.  Dietary education  -Monitor glucose on BMPs   12: Hypokalemia: resolved   -9/8 stable at 4.2   13:Leukocytosis: borderline , pt is afeb    Latest Ref Rng & Units 10/16/2022    7:03 AM 10/13/2022    6:12 AM 10/10/2022   11:40 AM  CBC  WBC 4.0 - 10.5 K/uL 8.2  10.4  10.6   Hemoglobin 13.0 - 17.0 g/dL 29.5  62.1   30.8   Hematocrit 39.0 - 52.0 % 42.4  40.2  38.5   Platelets 150 - 400 K/uL 293  256  267       14: Hypertension: monitor TID and prn             -home amlodipine 10 mg held, resume as appropriate     Vitals:   10/19/22 1942 10/20/22 0419  BP: 116/72 119/74  Pulse: 77 75  Resp: 16 16  Temp: 98.8 F (37.1 C) 98.6 F (37 C)  SpO2: 97% 100%   BP sl elevated today per d/c summ from acute resume amlodipine, now is 7d post CVA , start 5mg  (home dose is 10mg )  -9/13 BP control 15. Azotemia  -9/8 Cr 1.31/25 continue to encourage him to take oral fluids and monitor      Latest Ref Rng & Units 10/16/2022    7:03 AM 10/15/2022    5:08 AM 10/13/2022    6:12 AM  BMP  Glucose 70 - 99 mg/dL 409  811  914   BUN 8 - 23 mg/dL 19  25  21    Creatinine 0.61 - 1.24 mg/dL 7.82  9.56  2.13   Sodium 135 - 145 mmol/L 138  137  138   Potassium 3.5 - 5.1 mmol/L 4.0  4.2  4.4   Chloride 98 - 111 mmol/L 102  105  102   CO2 22 - 32 mmol/L 25  25  25    Calcium 8.9 - 10.3 mg/dL 9.5  9.4  08.6   Creat now is normal   16.  Dizziness, intermittent  -Reviewed related to cerebellar stroke, will order orthostatic vital signs   LOS: 8 days A FACE TO FACE EVALUATION WAS PERFORMED  Fanny Dance 10/20/2022, 1:42 PM

## 2022-10-23 ENCOUNTER — Telehealth: Payer: Self-pay | Admitting: Radiology

## 2022-10-23 NOTE — Telephone Encounter (Signed)
Patient has hospital F/u appt with Dr. Jonny Ruiz tomorrow.

## 2022-10-24 ENCOUNTER — Encounter: Payer: Self-pay | Admitting: Internal Medicine

## 2022-10-24 ENCOUNTER — Ambulatory Visit: Payer: Medicare PPO | Admitting: Internal Medicine

## 2022-10-24 VITALS — BP 132/86 | HR 100 | Temp 99.2°F | Ht 74.0 in | Wt 176.0 lb

## 2022-10-24 DIAGNOSIS — Z8673 Personal history of transient ischemic attack (TIA), and cerebral infarction without residual deficits: Secondary | ICD-10-CM

## 2022-10-24 DIAGNOSIS — E7800 Pure hypercholesterolemia, unspecified: Secondary | ICD-10-CM

## 2022-10-24 DIAGNOSIS — E559 Vitamin D deficiency, unspecified: Secondary | ICD-10-CM

## 2022-10-24 MED ORDER — REPATHA SURECLICK 140 MG/ML ~~LOC~~ SOAJ: 140.0000 mg | SUBCUTANEOUS | 3 refills | Status: DC

## 2022-10-24 MED ORDER — ROSUVASTATIN CALCIUM 20 MG PO TABS: 20.0000 mg | ORAL_TABLET | Freq: Every day | ORAL | 3 refills | Status: DC

## 2022-10-24 NOTE — Progress Notes (Signed)
Patient ID: LUXTON SALONEN, male   DOB: Dec 21, 1950, 72 y.o.   MRN: [id number]        Chief Complaint: follow up recent hospn and rehab stay for acute CVA left cerebellar sept 5 - 13 2024       HPI:  ARGO MCCOWIN is a 72 y.o. male here with above; doing well.  Pt denies chest pain, increased sob or doe, wheezing, orthopnea, PND, increased LE swelling, palpitations, dizziness or syncope.   Pt denies polydipsia, polyuria, or new focal neuro s/s.    Pt denies fever, wt loss, night sweats, loss of appetite, or other constitutional symptoms  Pt willing to start crestor now after further discussion today.  Has neuro f/u next month.   Transitional Care Management elements noted today: 1)  Date of D/C: as above 2)  Medication reconciliation:  done today at end visit 3)  Review of D/C summary or other information:  done today 4)  Review of need for f/u on pending diagnostic tests and treatments:  done today - none 5)  Review of need for Interaction with other providers who will assume or resume care of pt specific problems: done today - neurology next month 6)  Education of patient/family/guardian or caregiver: none needed        Wt Readings from Last 3 Encounters:  10/24/22 176 lb (79.8 kg)  10/18/22 173 lb 4.5 oz (78.6 kg)  10/10/22 170 lb 3.1 oz (77.2 kg)   BP Readings from Last 3 Encounters:  10/24/22 132/86  10/20/22 119/74  10/12/22 138/83         Past Medical History:  Diagnosis Date   ANEMIA, MILD    CAD (coronary artery disease)    a. CAD,  b. s/p Cypher DES to the ramus and Promus DES x 2 to the RCA 08/2007 (minimal disease in LAD and CFX at that time), c. Myoview 8/10: EF 63%, inf thinning, small prior ant infarct, no ischemia;  d. s/p CABG 3/14 (L-LAD, RIMA-OM1)   DEGENERATIVE JOINT DISEASE, RIGHT HIP    DIVERTICULOSIS, COLON    ERECTILE DYSFUNCTION    GERD    Heart murmur    HIATAL HERNIA    HTN (hypertension)    Hyperlipidemia    statin intolerant   HYPERPLASIA PROSTATE UNS  W/O UR OBST & OTH LUTS    LUMBAR RADICULOPATHY, RIGHT    Other abnormal glucose    PAD (peripheral artery disease) (HCC)    pre CABG 04/2012 => ABIs: Right 0.92, left 0.61 // ABI 09/02/14: R 0.93; L 0.82   SPINAL STENOSIS, LUMBAR    Past Surgical History:  Procedure Laterality Date   COLONOSCOPY  02/07/2000   negative   COLONOSCOPY  08/2017   TA, Dr Myrtie Neither   CORONARY ARTERY BYPASS GRAFT N/A 04/22/2012   Procedure: CORONARY ARTERY BYPASS GRAFTING (CABG);  Surgeon: Loreli Slot, MD;  Location: [facility] OR;  Service: Open Heart Surgery;  Laterality: N/A;  CABG X 2, BIMA, POSSIBLE EVH   CORONARY STENT PLACEMENT  02/07/2008    X 2   TEE WITHOUT CARDIOVERSION N/A 04/22/2012   Procedure: TRANSESOPHAGEAL ECHOCARDIOGRAM (TEE);  Surgeon: Loreli Slot, MD;  Location: [facility] OR;  Service: Open Heart Surgery;  Laterality: N/A;   TOTAL HIP ARTHROPLASTY Right 02/07/2007   UPPER GASTROINTESTINAL ENDOSCOPY      reports that he quit smoking about 37 years ago. His smoking use included cigarettes. He started smoking about 49 years ago. He has a 18  pack-year smoking history. He has never used smokeless tobacco. He reports that he does not drink alcohol and does not use drugs. family history includes Diabetes in his maternal grandmother; Heart attack (age of onset: 44) in his father; Heart attack (age of onset: 34) in his brother; Hyperlipidemia in his brother and mother; Hypertension in his father; Pulmonary embolism in his brother; Stroke in his brother. Allergies  Allergen Reactions   Accupril [Quinapril Hcl] Cough   Ace Inhibitors Other (See Comments)    ACE inhibitors are contraindicated because of a history of rash with Angiotensin receptor blocker.   Calan [Verapamil] Other (See Comments)    Sun sensitivity   Crestor [Rosuvastatin] Other (See Comments)    Myalgias Cramps   Glucosamine Other (See Comments)    Unknown reaction   Lipitor [Atorvastatin] Other (See Comments)    Myalgias  Cramps    Livalo [Pitavastatin] Other (See Comments)    Cramps   Pravachol [Pravastatin] Other (See Comments)    Myalgias Cramps   Statins Other (See Comments)    Myalgias, cramping with: Crestor, Lipitor, Livalo, Pravachol.   Zetia [Ezetimibe] Other (See Comments)    Myalgias Cramps  Pt ok on 3x weekly dose   Benicar [Olmesartan] Rash   Current Outpatient Medications on File Prior to Visit  Medication Sig Dispense Refill   amLODipine (NORVASC) 5 MG tablet Take 1 tablet (5 mg total) by mouth daily. 30 tablet 0   aspirin EC 81 MG tablet Take 1 tablet (81 mg total) by mouth daily. Swallow whole. 120 tablet 0   clopidogrel (PLAVIX) 75 MG tablet Take 1 tablet (75 mg total) by mouth daily. 30 tablet 0   ezetimibe (ZETIA) 10 MG tablet Take 10 mg by mouth 3 (three) times a week.     meclizine (ANTIVERT) 12.5 MG tablet Take 1 tablet (12.5 mg total) by mouth 3 (three) times daily as needed for dizziness. 40 tablet 1   Multiple Vitamins-Minerals (CENTRUM SILVER 50+MEN) TABS Take 1 tablet by mouth daily.     nitroGLYCERIN (NITROSTAT) 0.4 MG SL tablet Take 1 tab under your tongue for chest pain  If no relief of pain may repeat NTG, one tab every 5 minutes up to 3 tablets total over 15 minutes. 25 tablet 3   tamsulosin (FLOMAX) 0.4 MG CAPS capsule Take 0.4 mg by mouth at bedtime.     No current facility-administered medications on file prior to visit.        ROS:  All others reviewed and negative.  Objective        PE:  BP 132/86 (BP Location: Right Arm, Patient Position: Sitting, Cuff Size: Normal)   Pulse 100   Temp 99.2 F (37.3 C) (Oral)   Ht 6\' 2"  (1.88 m)   Wt 176 lb (79.8 kg)   SpO2 95%   BMI 22.60 kg/m                 Constitutional: Pt appears in NAD               HENT: Head: NCAT.                Right Ear: External ear normal.                 Left Ear: External ear normal.                Eyes: . Pupils are equal, round, and reactive to light. Conjunctivae and EOM are normal  Nose: without d/c or deformity               Neck: Neck supple. Gross normal ROM               Cardiovascular: Normal rate and regular rhythm.                 Pulmonary/Chest: Effort normal and breath sounds without rales or wheezing.                Abd:  Soft, NT, ND, + BS, no organomegaly               Neurological: Pt is alert. At baseline orientation, motor grossly intact               Skin: Skin is warm. No rashes, no other new lesions, LE edema - none               Psychiatric: Pt behavior is normal without agitation   Micro: none  Cardiac tracings I have personally interpreted today:  none  Pertinent Radiological findings (summarize): none   Lab Results  Component Value Date   WBC 8.2 10/16/2022   HGB 13.6 10/16/2022   HCT 42.4 10/16/2022   PLT 293 10/16/2022   GLUCOSE 108 (H) 10/16/2022   CHOL 222 (H) 10/11/2022   TRIG 59 10/11/2022   HDL 58 10/11/2022   LDLDIRECT 130.4 11/17/2009   LDLCALC 152 (H) 10/11/2022   ALT 14 10/13/2022   AST 17 10/13/2022   NA 138 10/16/2022   K 4.0 10/16/2022   CL 102 10/16/2022   CREATININE 1.14 10/16/2022   BUN 19 10/16/2022   CO2 25 10/16/2022   TSH 2.52 03/28/2022   PSA 6.55 (H) 03/24/2021   INR 1.28 04/22/2012   HGBA1C 5.7 (H) 10/11/2022   Assessment/Plan:  CORRIGAN WANEK is a 72 y.o. Black or African American [2] male with  has a past medical history of ANEMIA, MILD, CAD (coronary artery disease), DEGENERATIVE JOINT DISEASE, RIGHT HIP, DIVERTICULOSIS, COLON, ERECTILE DYSFUNCTION, GERD, Heart murmur, HIATAL HERNIA, HTN (hypertension), Hyperlipidemia, HYPERPLASIA PROSTATE UNS W/O UR OBST & OTH LUTS, LUMBAR RADICULOPATHY, RIGHT, Other abnormal glucose, PAD (peripheral artery disease) (HCC), and SPINAL STENOSIS, LUMBAR.  History of stroke Stable, to continue plavix and zetia, also willing to try crestor 20 every day - to start this  Hyperlipidemia Lab Results  Component Value Date   LDLCALC 152 (H) 10/11/2022   unclear, pt  to continue zetia 10 every day, add crestor 20 every day, f/u lab next visit   Vitamin D deficiency Last vitamin D Lab Results  Component Value Date   VD25OH 30.66 03/28/2022   Low, to start oral replacement  Followup: Return in about 4 months (around 02/23/2023).  Oliver Barre, MD 10/24/2022 7:47 PM [facility] Internal Medicine

## 2022-10-24 NOTE — Patient Instructions (Addendum)
Please take all new medication as prescribed -the crestor 20 mg per day  Please continue all other medications as before, including the zetia  Please have the pharmacy call with any other refills you may need.  Please continue your efforts at being more active, low cholesterol diet, and weight control.  Please keep your appointments with your specialists as you may have planned - Neurology in October  Please make an Appointment to return in 4 months, or sooner if needed

## 2022-10-24 NOTE — Assessment & Plan Note (Signed)
Last vitamin D Lab Results  Component Value Date   VD25OH 30.66 03/28/2022   Low, to start oral replacement

## 2022-10-24 NOTE — Assessment & Plan Note (Signed)
Stable, to continue plavix and zetia, also willing to try crestor 20 every day - to start this

## 2022-10-24 NOTE — Assessment & Plan Note (Signed)
Lab Results  Component Value Date   LDLCALC 152 (H) 10/11/2022   unclear, pt to continue zetia 10 every day, add crestor 20 every day, f/u lab next visit

## 2022-11-01 DIAGNOSIS — R3912 Poor urinary stream: Secondary | ICD-10-CM | POA: Diagnosis not present

## 2022-11-01 DIAGNOSIS — R972 Elevated prostate specific antigen [PSA]: Secondary | ICD-10-CM | POA: Diagnosis not present

## 2022-11-01 DIAGNOSIS — R3915 Urgency of urination: Secondary | ICD-10-CM | POA: Diagnosis not present

## 2022-11-01 DIAGNOSIS — N401 Enlarged prostate with lower urinary tract symptoms: Secondary | ICD-10-CM | POA: Diagnosis not present

## 2022-11-01 DIAGNOSIS — R3914 Feeling of incomplete bladder emptying: Secondary | ICD-10-CM | POA: Diagnosis not present

## 2022-11-07 ENCOUNTER — Ambulatory Visit: Payer: Medicare PPO

## 2022-11-07 ENCOUNTER — Ambulatory Visit: Payer: Medicare PPO | Attending: Physician Assistant | Admitting: Occupational Therapy

## 2022-11-07 VITALS — BP 159/98

## 2022-11-07 DIAGNOSIS — R42 Dizziness and giddiness: Secondary | ICD-10-CM | POA: Diagnosis not present

## 2022-11-07 DIAGNOSIS — R262 Difficulty in walking, not elsewhere classified: Secondary | ICD-10-CM | POA: Diagnosis not present

## 2022-11-07 DIAGNOSIS — R2681 Unsteadiness on feet: Secondary | ICD-10-CM | POA: Diagnosis not present

## 2022-11-07 DIAGNOSIS — M6281 Muscle weakness (generalized): Secondary | ICD-10-CM | POA: Diagnosis not present

## 2022-11-07 DIAGNOSIS — R278 Other lack of coordination: Secondary | ICD-10-CM | POA: Diagnosis not present

## 2022-11-07 DIAGNOSIS — I63542 Cerebral infarction due to unspecified occlusion or stenosis of left cerebellar artery: Secondary | ICD-10-CM | POA: Diagnosis not present

## 2022-11-07 NOTE — Therapy (Signed)
OUTPATIENT OCCUPATIONAL THERAPY NEURO EVALUATION  Patient Name: Eric Dickerson MRN: [id number] DOB:October 22, 1950, 72 y.o., male Today's Date: 11/07/2022  PCP: [name], MD REFERRING PROVIDER: Milinda Antis, PA-C  END OF SESSION:  OT End of Session - 11/07/22 1402     Visit Number 1    Number of Visits 1    Authorization Type [employer] MEDICARE CHOICE PPO    OT Start Time 1401    OT Stop Time 1436    OT Time Calculation (min) 35 min    Equipment Utilized During Treatment Testing material    Activity Tolerance Patient tolerated treatment well    Behavior During Therapy WFL for tasks assessed/performed             Past Medical History:  Diagnosis Date   ANEMIA, MILD    CAD (coronary artery disease)    a. CAD,  b. s/p Cypher DES to the ramus and Promus DES x 2 to the RCA 08/2007 (minimal disease in LAD and CFX at that time), c. Myoview 8/10: EF 63%, inf thinning, small prior ant infarct, no ischemia;  d. s/p CABG 3/14 (L-LAD, RIMA-OM1)   DEGENERATIVE JOINT DISEASE, RIGHT HIP    DIVERTICULOSIS, COLON    ERECTILE DYSFUNCTION    GERD    Heart murmur    HIATAL HERNIA    HTN (hypertension)    Hyperlipidemia    statin intolerant   HYPERPLASIA PROSTATE UNS W/O UR OBST & OTH LUTS    LUMBAR RADICULOPATHY, RIGHT    Other abnormal glucose    PAD (peripheral artery disease) (HCC)    pre CABG 04/2012 => ABIs: Right 0.92, left 0.61 // ABI 09/02/14: R 0.93; L 0.82   SPINAL STENOSIS, LUMBAR    Past Surgical History:  Procedure Laterality Date   COLONOSCOPY  02/07/2000   negative   COLONOSCOPY  08/2017   TA, Dr Myrtie Neither   CORONARY ARTERY BYPASS GRAFT N/A 04/22/2012   Procedure: CORONARY ARTERY BYPASS GRAFTING (CABG);  Surgeon: Loreli Slot, MD;  Location: [facility] OR;  Service: Open Heart Surgery;  Laterality: N/A;  CABG X 2, BIMA, POSSIBLE EVH   CORONARY STENT PLACEMENT  02/07/2008    X 2   TEE WITHOUT CARDIOVERSION N/A 04/22/2012   Procedure: TRANSESOPHAGEAL ECHOCARDIOGRAM (TEE);   Surgeon: Loreli Slot, MD;  Location: [facility] OR;  Service: Open Heart Surgery;  Laterality: N/A;   TOTAL HIP ARTHROPLASTY Right 02/07/2007   UPPER GASTROINTESTINAL ENDOSCOPY     Patient Active Problem List   Diagnosis Date Noted   History of stroke 10/24/2022   CVA (cerebral vascular accident) (HCC) 10/12/2022   Vertebral artery dissection (HCC) 10/11/2022   Acute ischemic stroke (HCC) 10/10/2022   Hypokalemia 10/10/2022   Low magnesium levels 04/01/2022   Vitamin D deficiency 03/27/2021   Statin intolerance 03/24/2021   Vertigo 08/31/2020   OAB (overactive bladder) 05/03/2020   Statin myopathy 05/06/2019   Low back pain 04/22/2019   Pain in joint of right hip 12/12/2017   Elevated PSA 03/01/2017   CAD (coronary artery disease)    HTN (hypertension)    Hyperlipidemia    Impacted cerumen of both ears 11/24/2015   Encounter for well adult exam with abnormal findings 03/29/2015   Allergic rhinitis 03/23/2015   Solar urticaria 07/30/2014   Primary localized osteoarthrosis, lower leg 07/15/2013   Acute medial meniscal tear 07/15/2013   Other abnormal glucose 09/06/2012   Urgency of urination 09/06/2012   PAD (peripheral artery disease) (HCC) 06/28/2011  ANEMIA, MILD 02/02/2010   Urinary frequency 02/02/2010   SPINAL STENOSIS, LUMBAR 08/05/2008   LUMBAR RADICULOPATHY, RIGHT 07/29/2008   Diaphragmatic hernia 08/19/2007   Diverticulosis of colon 08/19/2007   GERD 05/06/2007   ERECTILE DYSFUNCTION 01/22/2007   BPH (benign prostatic hyperplasia) 01/22/2007   DEGENERATIVE JOINT DISEASE, RIGHT HIP 01/22/2007    ONSET DATE: 10/25/2022 referral; stroke 10/10/22   REFERRING DIAG: I69.30 (ICD-10-CM) - Unspecified sequelae of cerebral infarction  THERAPY DIAG:  Cerebrovascular accident (CVA) due to stenosis of left cerebellar artery (HCC)  Other lack of coordination  Muscle weakness (generalized)  Rationale for Evaluation and Treatment: Rehabilitation  SUBJECTIVE:    SUBJECTIVE STATEMENT:  Pt reports that he is doing all the things he was before his stroke.  He even cut his lawn the other day. He drove himself to therapy today.  Pt reports that he hot, had an upset stomach and couldn't stand before he went to the hospital with his stroke.  Pt accompanied by: self  PERTINENT HISTORY:  Hospitalized: 10/10/22 Rehab 10/12/2022 - 10/20/2022   PMHx:  CVA (cerebral vascular accident) - Functional deficits secondary to infarcts of the left cerebellum, Left V4 atherosclerotic stenosis versus dissection, Urinary retention, Hyperlipidemia, History of coronary artery disease, Borderline prediabetes, Hypokalemia, Hypertension, R hip replacement about 15 years ago  PRECAUTIONS: None  WEIGHT BEARING RESTRICTIONS: No  PAIN:  Are you having pain? No  FALLS: Has patient fallen in last 6 months? Yes. 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  OBJECTIVE:  Note: Objective measures were completed at Evaluation unless otherwise noted.  HAND DOMINANCE: Right  ADLs: Overall ADLs: Pt reports independence with all ADLs, transfers etc Tub Shower transfers: walk in shower Equipment: Walk in shower with corner seat - may sit to wash feet  IADLs: Shopping: Wife does this and did so before the stroke Light housekeeping: May mop and vacuum; Wife did/does laundry Meal Prep: Fixes his own breakfast in the morning and wife does the big meals Community mobility: Ind - no AE Medication management: Mod Ind with pill box, sorts his own medication Financial management: Takes care of his own bills via online  Handwriting: 90%  legible(printing)  MOBILITY STATUS: Independent  POSTURE COMMENTS:  forward head Sitting balance:  WNL  ACTIVITY TOLERANCE: Activity tolerance: Good by pt report  FUNCTIONAL OUTCOME MEASURES: Quick Dash: 0.0 - No difficulty when any tasks  UPPER EXTREMITY ROM:    B UE - WFL  UPPER EXTREMITY MMT:     B UE - 5/5  HAND FUNCTION: Grip strength: Right: 104.2, 100.5  lbs; Left: 95.2, 90.8,  lbs Average Right 102.3 lbs Left 93.0 lbs  COORDINATION: 9 Hole Peg test: Right: 26.33  sec; Left: 32.53 sec  SENSATION: WFL  EDEMA: NA  MUSCLE TONE: WFL  COGNITION: Overall cognitive status: Within functional limits for tasks assessed and No family/caregiver present to determine baseline cognitive functioning  VISION: Subjective report: No changes in vision reported by pt. Baseline vision: Wears glasses all the time and progressive lenses Visual history: NA  VISION ASSESSMENT: WFL  Patient has no difficulty with functional activities due to vision deficits.  PERCEPTION: Not tested  PRAXIS: Not tested  OBSERVATIONS: Pt ambulates with no AE and tends to walk slowly upon exiting therapy gym. No loss of balance. The pt appears well kept and has glasses donned.   PATIENT EDUCATION: Education details: OT role and continued activity at home Person educated: Patient Education method: Explanation Education comprehension: verbalized understanding   ASSESSMENT:  CLINICAL IMPRESSION: Patient is a 72 y.o. male who was seen today for occupational therapy evaluation for follow up from recent CVA. Hx includes CABG, R hip DJD/THA.  His grip strength is functional at Right UE 102.3 lbs and Left UE 93.0 lbs.  He has functional coordination, sensation and visual tracking skills.  Patient currently presents at baseline level of function and is not recommended for skilled OT services in the outpatient setting at this time.   CO-MORBIDITIES: may have co-morbidities  that affects occupational  performance. Patient will benefit from skilled OT to address above impairments and improve overall function.  MODIFICATION OR ASSISTANCE TO COMPLETE EVALUATION: No modification of tasks or assist necessary to complete an evaluation.  OT OCCUPATIONAL PROFILE AND HISTORY: Problem focused assessment: Including review of records relating to presenting problem.  CLINICAL DECISION MAKING: LOW - limited treatment options, no task modification necessary  EVALUATION COMPLEXITY: Low    PLAN:  OT FREQUENCY: one time visit  RECOMMENDED OTHER SERVICES: N/A - PT evaluated patient today also   CONSULTED AND AGREED WITH PLAN OF CARE: Patient   Victorino Sparrow, OT 11/07/2022, 2:38 PM

## 2022-11-07 NOTE — Therapy (Signed)
OUTPATIENT PHYSICAL THERAPY NEURO EVALUATION   Patient Name: Eric Dickerson MRN: [id number] DOB:1950-02-16, 72 y.o., male Today's Date: 11/07/2022   PCP: Oliver Barre, MD REFERRING PROVIDER: Milinda Antis, PA-C  END OF SESSION:  PT End of Session - 11/07/22 1311     Visit Number 1    Number of Visits 1    Authorization Type human[employer] medicare    Progress Note Due on Visit 10    PT Start Time 1315    PT Stop Time 1345    PT Time Calculation (min) 30 min    Equipment Utilized During Treatment Gait belt    Activity Tolerance Patient tolerated treatment well    Behavior During Therapy WFL for tasks assessed/performed             Past Medical History:  Diagnosis Date   ANEMIA, MILD    CAD (coronary artery disease)    a. CAD,  b. s/p Cypher DES to the ramus and Promus DES x 2 to the RCA 08/2007 (minimal disease in LAD and CFX at that time), c. Myoview 8/10: EF 63%, inf thinning, small prior ant infarct, no ischemia;  d. s/p CABG 3/14 (L-LAD, RIMA-OM1)   DEGENERATIVE JOINT DISEASE, RIGHT HIP    DIVERTICULOSIS, COLON    ERECTILE DYSFUNCTION    GERD    Heart murmur    HIATAL HERNIA    HTN (hypertension)    Hyperlipidemia    statin intolerant   HYPERPLASIA PROSTATE UNS W/O UR OBST & OTH LUTS    LUMBAR RADICULOPATHY, RIGHT    Other abnormal glucose    PAD (peripheral artery disease) (HCC)    pre CABG 04/2012 => ABIs: Right 0.92, left 0.61 // ABI 09/02/14: R 0.93; L 0.82   SPINAL STENOSIS, LUMBAR    Past Surgical History:  Procedure Laterality Date   COLONOSCOPY  02/07/2000   negative   COLONOSCOPY  08/2017   TA, Dr Myrtie Neither   CORONARY ARTERY BYPASS GRAFT N/A 04/22/2012   Procedure: CORONARY ARTERY BYPASS GRAFTING (CABG);  Surgeon: Loreli Slot, MD;  Location: [facility] OR;  Service: Open Heart Surgery;  Laterality: N/A;  CABG X 2, BIMA, POSSIBLE EVH   CORONARY STENT PLACEMENT  02/07/2008    X 2   TEE WITHOUT CARDIOVERSION N/A 04/22/2012   Procedure: TRANSESOPHAGEAL  ECHOCARDIOGRAM (TEE);  Surgeon: Loreli Slot, MD;  Location: [facility] OR;  Service: Open Heart Surgery;  Laterality: N/A;   TOTAL HIP ARTHROPLASTY Right 02/07/2007   UPPER GASTROINTESTINAL ENDOSCOPY     Patient Active Problem List   Diagnosis Date Noted   History of stroke 10/24/2022   CVA (cerebral vascular accident) (HCC) 10/12/2022   Vertebral artery dissection (HCC) 10/11/2022   Acute ischemic stroke (HCC) 10/10/2022   Hypokalemia 10/10/2022   Low magnesium levels 04/01/2022   Vitamin D deficiency 03/27/2021   Statin intolerance 03/24/2021   Vertigo 08/31/2020   OAB (overactive bladder) 05/03/2020   Statin myopathy 05/06/2019   Low back pain 04/22/2019   Pain in joint of right hip 12/12/2017   Elevated PSA 03/01/2017   CAD (coronary artery disease)    HTN (hypertension)    Hyperlipidemia    Impacted cerumen of both ears 11/24/2015   Encounter for well adult exam with abnormal findings 03/29/2015   Allergic rhinitis 03/23/2015   Solar urticaria 07/30/2014   Primary localized osteoarthrosis, lower leg 07/15/2013   Acute medial meniscal tear 07/15/2013   Other abnormal glucose 09/06/2012   Urgency of urination 09/06/2012  PAD (peripheral artery disease) (HCC) 06/28/2011   ANEMIA, MILD 02/02/2010   Urinary frequency 02/02/2010   SPINAL STENOSIS, LUMBAR 08/05/2008   LUMBAR RADICULOPATHY, RIGHT 07/29/2008   HIATAL HERNIA 08/19/2007   DIVERTICULOSIS, COLON 08/19/2007   GERD 05/06/2007   ERECTILE DYSFUNCTION 01/22/2007   BPH (benign prostatic hyperplasia) 01/22/2007   DEGENERATIVE JOINT DISEASE, RIGHT HIP 01/22/2007    ONSET DATE: 10/25/2022 referral  REFERRING DIAG: I69.30 (ICD-10-CM) - Unspecified sequelae of cerebral infarction  THERAPY DIAG:  Difficulty in walking, not elsewhere classified  Unsteadiness on feet  Dizziness and giddiness  Rationale for Evaluation and Treatment: Rehabilitation  SUBJECTIVE:                                                                                                                                                                                              SUBJECTIVE STATEMENT: Patient arrives to clinic alone, no AD. Patient reports he's been doing everything he "normally does" since dc from hospital. Has been driving. 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  Pt accompanied by: self  PERTINENT HISTORY: anemia, arthritis, GERD, heart murmur, HLD, HTN, R lumbar radic, CVA  PAIN:  Are you having pain? No  PRECAUTIONS: Fall   WEIGHT BEARING RESTRICTIONS: No  FALLS: Has patient fallen in last 6 months? No  LIVING ENVIRONMENT: Lives with: lives with their spouse Lives in: House/apartment Stairs: No Has following equipment at home: Single point cane, Environmental consultant - 2 wheeled, Environmental consultant - 4 wheeled, Crutches, shower chair, and Grab bars  PLOF: Independent  PATIENT GOALS: "I don't think I need PT"  OBJECTIVE:  Note: Objective measures were completed at Evaluation unless otherwise noted.  DIAGNOSTIC FINDINGS: 10/10/22 brain MRI: IMPRESSION: 1. Acute infarcts in the left cerebellum. No hemorrhage. 2. Severe spinal canal stenosis at C4-C5. Consider further evaluation with a cervical spine MRI.  COGNITION: Overall cognitive status: Within functional limits for tasks assessed   SENSATION: WFL  COORDINATION: Very mild dysdiadochokinesia L LE  POSTURE: No Significant postural limitations   LOWER EXTREMITY MMT:   WFL  BED MOBILITY:  Reports independent    STAIRS: Level of Assistance: Modified independence Stair Negotiation Technique: Alternating Pattern  with Single Rail on Right Number of Stairs: 4  Height of Stairs: 6    GAIT: Gait pattern: step through pattern, decreased arm swing- Right, decreased arm swing- Left, trendelenburg, decreased trunk rotation, trunk flexed, wide BOS, poor foot clearance- Right, and poor  foot clearance- Left Distance walked: clinic Assistive device utilized: None Level of assistance: Modified independence  FUNCTIONAL  TESTS:   [facility] PT Assessment - 11/07/22 0001       Standardized Balance Assessment   Standardized Balance Assessment 10 meter walk test    10 Meter Walk 1.1m/s      Functional Gait  Assessment   Gait assessed  Yes    Gait Level Surface Walks 20 ft in less than 5.5 sec, no assistive devices, good speed, no evidence for imbalance, normal gait pattern, deviates no more than 6 in outside of the 12 in walkway width.    Change in Gait Speed Able to smoothly change walking speed without loss of balance or gait deviation. Deviate no more than 6 in outside of the 12 in walkway width.    Gait with Horizontal Head Turns Performs head turns smoothly with no change in gait. Deviates no more than 6 in outside 12 in walkway width    Gait with Vertical Head Turns Performs head turns with no change in gait. Deviates no more than 6 in outside 12 in walkway width.    Gait and Pivot Turn Pivot turns safely in greater than 3 sec and stops with no loss of balance, or pivot turns safely within 3 sec and stops with mild imbalance, requires small steps to catch balance.    Step Over Obstacle Is able to step over 2 stacked shoe boxes taped together (9 in total height) without changing gait speed. No evidence of imbalance.    Gait with Narrow Base of Support Ambulates 4-7 steps.    Gait with Eyes Closed Walks 20 ft, slow speed, abnormal gait pattern, evidence for imbalance, deviates 10-15 in outside 12 in walkway width. Requires more than 9 sec to ambulate 20 ft.    Ambulating Backwards Walks 20 ft, uses assistive device, slower speed, mild gait deviations, deviates 6-10 in outside 12 in walkway width.    Steps Alternating feet, must use rail.    Total Score 23              Vestibular Asssessment   General Observation: NAD    Symptom Behavior:   Subjective history: related  to CVA   Non-Vestibular symptoms:  none   Type of dizziness: Imbalance (Disequilibrium)   Frequency: with certain movements    Duration: seconds   Aggravating factors: Induced by motion: turning body quickly and turning head quickly   Relieving factors: slow movements   Progression of symptoms: better   Oculomotor Exam:   Ocular Alignment: normal   Ocular ROM: No Limitations   Spontaneous Nystagmus: absent   Gaze-Induced Nystagmus: absent   Smooth Pursuits: intact   Saccades: intact   Convergence/Divergence: <5 cm     Vestibular-Ocular Reflex (VOR):   Slow VOR: Normal   VOR Cancellation: Normal   Head-Impulse Test: HIT Right: negative HIT Left: negative    Positional Testing: not indicated based on symptoms       PATIENT SURVEYS:  FOTO not captured by staff  TODAY'S TREATMENT:  N/a eval   PATIENT EDUCATION: Education details: PT POC, exam findings, implications of balance deficits, clearance for return to driving  Person educated: Patient Education method: Explanation Education comprehension: verbalized understanding  HOME EXERCISE PROGRAM: Continue HEP from IPR  GOALS: Not indicated as patient declines OP PT at this time  ASSESSMENT:  CLINICAL IMPRESSION: Patient is a 72 y.o. male who was seen today for physical therapy evaluation and treatment for balance and gait deficits s/p CVA. Patient reports that his gait is premorbid due to orthopedic pain from THA and L knee OA. 10 Meter Walk Test: Patient instructed to walk 10 meters (32.8 ft) as quickly and as safely as possible at their normal speed x2 and at a fast speed x2. Time measured from 2 meter mark to 8 meter mark to accommodate ramp-up and ramp-down.  Normal speed: 1.40m/s Cut off scores: <0.4 m/s = household Ambulator, 0.4-0.8 m/s = limited community Ambulator, >0.8 m/s = community  Ambulator, >1.2 m/s = crossing a street, <1.0 = increased fall risk MCID 0.05 m/s (small), 0.13 m/s (moderate), 0.06 m/s (significant)  (ANPTA Core Set of Outcome Measures for Adults with Neurologic Conditions, 2018). Patient scored a 23/30 on  Functional Gait Assessment.   <22/30 = predictive of falls, <20/30 = fall in 6 months, <18/30 = predictive of falls in PD MCID: 5 points stroke population, 4 points geriatric population (ANPTA Core Set of Outcome Measures for Adults with Neurologic Conditions, 2018). Upon reviewing chart from CIR, patient noted to have L vestibular hypofunction. On eval today, this has resolved. He declines to participate in skilled PT therapy at this time.      CLINICAL DECISION MAKING: Stable/uncomplicated  EVALUATION COMPLEXITY: Low  PLAN:  PT FREQUENCY: one time visit  PT DURATION: other: 1x visit   Westley Foots, PT Westley Foots, PT, DPT, CBIS  11/07/2022, 2:00 PM

## 2022-11-09 ENCOUNTER — Other Ambulatory Visit (HOSPITAL_COMMUNITY): Payer: Self-pay

## 2022-11-16 ENCOUNTER — Telehealth: Payer: Self-pay | Admitting: Internal Medicine

## 2022-11-16 ENCOUNTER — Other Ambulatory Visit (HOSPITAL_COMMUNITY): Payer: Self-pay

## 2022-11-16 NOTE — Telephone Encounter (Signed)
Prescription Request  11/16/2022  LOV: 10/24/2022  What is the name of the medication or equipment? amLODipine (NORVASC) 5 MG tablet  rosuvastatin (CRESTOR) 20 MG tablet clopidogrel (PLAVIX) 75 MG tablet   Have you contacted your pharmacy to request a refill? Yes   Which pharmacy would you like this sent to?  [id number] [place], [place] - [phone] AT [facility] OF [facility] & [address] [place] [address] Phone: [phone] Fax: [phone]    Patient notified that their request is being sent to the clinical staff for review and that they should receive a response within 2 business days.   Please advise at Mobile [phone] (mobile)

## 2022-11-17 NOTE — Telephone Encounter (Signed)
Crestor not due for refill , other two meds were prescribed by different provider.

## 2022-11-20 ENCOUNTER — Other Ambulatory Visit: Payer: Self-pay

## 2022-11-20 MED ORDER — CLOPIDOGREL BISULFATE 75 MG PO TABS: 75.0000 mg | ORAL_TABLET | Freq: Every day | ORAL | 3 refills | Status: DC

## 2022-11-20 MED ORDER — AMLODIPINE BESYLATE 5 MG PO TABS: 5.0000 mg | ORAL_TABLET | Freq: Every day | ORAL | 3 refills | Status: DC

## 2022-11-20 NOTE — Telephone Encounter (Signed)
Patient said the provider was from when he was in the hospital. He has been seen by PCP since that hospital visit.  He said he is completely out of the Plavix.

## 2022-11-20 NOTE — Telephone Encounter (Signed)
Refill sent.

## 2022-11-28 ENCOUNTER — Encounter: Payer: Self-pay | Admitting: Physical Medicine & Rehabilitation

## 2022-11-28 ENCOUNTER — Encounter: Payer: Medicare PPO | Attending: Physical Medicine & Rehabilitation | Admitting: Physical Medicine & Rehabilitation

## 2022-11-28 VITALS — BP 156/91 | HR 78 | Ht 72.0 in | Wt 178.0 lb

## 2022-11-28 DIAGNOSIS — M47816 Spondylosis without myelopathy or radiculopathy, lumbar region: Secondary | ICD-10-CM | POA: Diagnosis not present

## 2022-11-28 DIAGNOSIS — M1712 Unilateral primary osteoarthritis, left knee: Secondary | ICD-10-CM | POA: Insufficient documentation

## 2022-11-28 DIAGNOSIS — I639 Cerebral infarction, unspecified: Secondary | ICD-10-CM | POA: Diagnosis not present

## 2022-11-28 NOTE — Progress Notes (Signed)
Subjective:    Patient ID: Eric Dickerson, male    DOB: [birth year], 72 y.o.   MRN: [id number] 72 y.o. male  who presented to the ED with sudden onset of dizziness and gait instability as well as feeling of falling if he stood up with room spinning. Seen by neurology. His last known well was somewhere around 10 AM-he was outside the window for IV TNKase by the time neurological consultation was obtained. MRI showed cerebellar infarcts. Given his prior cardiac history, atheroembolic versus cardioembolic source of stroke is suspected-further workup needed. Plavix started. Intolerant of statins and aspirin.  Has mild left UE and LE dysmetria on exam but significant imbalance on walking with PT/OT.  CT of neck performed with  left V4 stenotic plaque versus dissection, right P2, right M1, bilateral siphon stenosis, right VA origin severe stenosis. Added aspirin and recommend DAPT for 3 months given left VA stenosis followed by aspirin alone. Zetia three times weekly  Admit date: 10/12/2022 Discharge date: 10/20/2022 HPI Patient feels like he is doing well from a stroke standpoint.  He is back to walking independently.  No device.  Independent with all dressing and bathing.  He has started driving again but did not get formal medical clearance.  Has seen his PCP since discharge from the hospital. His major concern is not related to his stroke but related to his pain complaints he is mainly right-sided low back pain.  In addition he has had chronic knee pain osteoarthritis gel injection of the right knee with good results left knee was not injected but feels like more intervention on this part. Not a good candidate for NSAIDs due to recent stroke Some neck tightness but no shooting pains into the arms  Left cerebellar infarcts  Low back pain 5/10 increases with ambulation   MRI LUMBAR SPINE WITHOUT CONTRAST   TECHNIQUE: Multiplanar, multisequence MR imaging of the lumbar spine was performed. No intravenous  contrast was administered.   COMPARISON:  CT lumbar spine dated August 04, 2008.   FINDINGS: Segmentation:  Standard.   Alignment: Unchanged levoscoliosis. Unchanged 5 mm anterolisthesis at L4-L5.   Vertebrae:  No fracture, evidence of discitis, or bone lesion.   Conus medullaris and cauda equina: Conus extends to the T12-L1 level. Conus and cauda equina appear normal.   Paraspinal and other soft tissues: Small left renal cysts. Otherwise negative.   Disc levels:   T12-L1:  Unchanged mild disc bulging. No stenosis.   L1-L2: Unchanged mild disc bulging eccentric to the left. Mild left lateral recess stenosis. No spinal canal or neuroforaminal stenosis.   L2-L3: Progressive disc height loss and mild diffuse disc bulging. New mild spinal canal and right neuroforaminal stenosis. No left neuroforaminal stenosis.   L3-L4: Unchanged mild diffuse disc bulging. Unchanged mild bilateral lateral recess stenosis. Unchanged moderate right neuroforaminal stenosis. No spinal canal or left neuroforaminal stenosis.   L4-L5: Unchanged mild disc bulging. Right subarticular and foraminal annular fissure with unchanged right foraminal disc osteophyte complex. Progressed advanced right facet arthropathy with new partial ankylosis. Unchanged mild left facet arthropathy. Unchanged mild right neuroforaminal stenosis. No spinal canal or left neuroforaminal stenosis.   L5-S1: Unchanged minimal disc bulging and mild bilateral facet arthropathy. No stenosis.   IMPRESSION: 1. Progressive degenerative changes of the lumbar spine as described above. New mild spinal canal and right neuroforaminal stenosis at L2-L3. 2. Unchanged moderate right neuroforaminal stenosis at L3-L4.     Electronically Signed   By: Vickki Hearing.D.  On: 05/29/2019 11:50  BILATERAL KNEES STANDING - 1 VIEW   COMPARISON:  None.   FINDINGS:  Significant medial joint space narrowing is noted on the left. No  other  bony abnormality is seen. No gross soft tissue changes are  noted.   IMPRESSION:  Left medial joint space narrowing    Electronically Signed    By: Alcide Clever M.D.    On: 07/15/2013 16:23  Pain Inventory Average Pain 0 Pain Right Now 0 My pain is  no pain  LOCATION OF PAIN  no pain  BOWEL Number of stools per week: 7   BLADDER Normal   Mobility walk without assistance how many minutes can you walk? No problem ability to climb steps?  yes Do you have any goals in this area?  yes  Function employed # of hrs/week 20 hour per week  Do you have any goals in this area?  yes  Neuro/Psych No problems in this area  Prior Studies Any changes since last visit?  yes  Physicians involved in your care Any changes since last visit?  yes   Family History  Problem Relation Age of Onset   Hyperlipidemia Mother    Hypertension Father    Heart attack Father 69       smoker   Stroke Brother        2 brothers ; 72 & 29   Hyperlipidemia Brother    Pulmonary embolism Brother    Heart attack Brother 82   Diabetes Maternal Grandmother    Cancer Neg Hx    COPD Neg Hx    Colon cancer Neg Hx    Esophageal cancer Neg Hx    Stomach cancer Neg Hx    Rectal cancer Neg Hx    Colon polyps Neg Hx    Social History   Socioeconomic History   Marital status: Married    Spouse name: Not on file   Number of children: 4   Years of education: 12   Highest education level: Not on file  Occupational History   Occupation: Facilities Management  Tobacco Use   Smoking status: Former    Current packs/day: 0.00    Average packs/day: 1.5 packs/day for 12.0 years (18.0 ttl pk-yrs)    Types: Cigarettes    Start date: 02/06/1973    Quit date: 02/06/1985    Years since quitting: 37.8   Smokeless tobacco: Never   Tobacco comments:    smoked 1967-1987, up to 1.5 ppd  Vaping Use   Vaping status: Never Used  Substance and Sexual Activity   Alcohol use: Never   Drug use: Never   Sexual  activity: Not on file  Other Topics Concern   Not on file  Social History Narrative   Denies abuse and feels safe at home.    Social Determinants of Health   Financial Resource Strain: Not on file  Food Insecurity: No Food Insecurity (10/10/2022)   Hunger Vital Sign    Worried About Running Out of Food in the Last Year: Never true    Ran Out of Food in the Last Year: Never true  Transportation Needs: No Transportation Needs (10/10/2022)   PRAPARE - Administrator, Civil Service (Medical): No    Lack of Transportation (Non-Medical): No  Physical Activity: Not on file  Stress: Not on file  Social Connections: Not on file   Past Surgical History:  Procedure Laterality Date   COLONOSCOPY  02/07/2000   negative  COLONOSCOPY  08/2017   TA, Dr Myrtie Neither   CORONARY ARTERY BYPASS GRAFT N/A 04/22/2012   Procedure: CORONARY ARTERY BYPASS GRAFTING (CABG);  Surgeon: Loreli Slot, MD;  Location: [facility] OR;  Service: Open Heart Surgery;  Laterality: N/A;  CABG X 2, BIMA, POSSIBLE EVH   CORONARY STENT PLACEMENT  02/07/2008    X 2   TEE WITHOUT CARDIOVERSION N/A 04/22/2012   Procedure: TRANSESOPHAGEAL ECHOCARDIOGRAM (TEE);  Surgeon: Loreli Slot, MD;  Location: [facility] OR;  Service: Open Heart Surgery;  Laterality: N/A;   TOTAL HIP ARTHROPLASTY Right 02/07/2007   UPPER GASTROINTESTINAL ENDOSCOPY     Past Medical History:  Diagnosis Date   ANEMIA, MILD    CAD (coronary artery disease)    a. CAD,  b. s/p Cypher DES to the ramus and Promus DES x 2 to the RCA 08/2007 (minimal disease in LAD and CFX at that time), c. Myoview 8/10: EF 63%, inf thinning, small prior ant infarct, no ischemia;  d. s/p CABG 3/14 (L-LAD, RIMA-OM1)   DEGENERATIVE JOINT DISEASE, RIGHT HIP    DIVERTICULOSIS, COLON    ERECTILE DYSFUNCTION    GERD    Heart murmur    HIATAL HERNIA    HTN (hypertension)    Hyperlipidemia    statin intolerant   HYPERPLASIA PROSTATE UNS W/O UR OBST & OTH LUTS    LUMBAR  RADICULOPATHY, RIGHT    Other abnormal glucose    PAD (peripheral artery disease) (HCC)    pre CABG 04/2012 => ABIs: Right 0.92, left 0.61 // ABI 09/02/14: R 0.93; L 0.82   SPINAL STENOSIS, LUMBAR    Ht 6' (1.829 m)   Wt 178 lb (80.7 kg)   SpO2 (!) 6%   BMI 24.14 kg/m   Opioid Risk Score:   Fall Risk Score:  `1  Depression screen [facility] 2/9     11/28/2022   12:54 PM 08/07/2022    3:53 PM 03/28/2022    2:07 PM 03/24/2021    2:00 PM 03/24/2021    1:41 PM 08/31/2020    8:49 AM 04/27/2020    3:20 PM  Depression screen PHQ 2/9  Decreased Interest 0 0 0 0 1 0 0  Down, Depressed, Hopeless 0 0 0 0 0 0 0  PHQ - 2 Score 0 0 0 0 1 0 0  Altered sleeping 0  0      Tired, decreased energy 0  0      Change in appetite 0  0      Feeling bad or failure about yourself  0  0      Trouble concentrating 0  0      Moving slowly or fidgety/restless 0  0      Suicidal thoughts 0  0      PHQ-9 Score 0  0      Difficult doing work/chores   Not difficult at all        Review of Systems     Objective:   Physical Exam General No acute distress Mood affect appropriate Visual fields are intact confrontation testing Cerebellar testing no dysmetria with finger-nose-finger testing or heel shin testing Motor strength is 5/5 bilateral hip flexor knee extensor ankle dorsiflexor plantar flexor Ambulates without assistive device no evidence of toe drag or knee instability Lumbar spine has tenderness palpation right lumbar paraspinal area He has pain with lumbar extension as well as right lateral bending. He also has pain with palpation of the medial joint line  on the left  knee.         Assessment & Plan:  1.  Left cerebellar infarct excellent recovery no abnormality on neuroexam noted other than subjective feelings of mild gait imbalance.  Patient will finish out outpatient PT OT 2.  Chronic low back pain mainly right sided MRI demonstrating facet arthropathy.  He has no radicular symptoms or focal  neurologic abnormalities in the right lower extremity. He has failed physical therapy.  Symptoms have been ongoing for greater than 3 months.  Will schedule for right-sided L3-L4 medial branch L5 dorsal ramus injection under fluoroscopic guidance with contrast.  If you have a 80% relief at least on a short-term basis after the injection this would be need to be confirmed with another set of injections and if another 80% short-term relief is obtained, radiofrequency neurotomy of the same nerves would be scheduled.

## 2022-11-28 NOTE — Patient Instructions (Signed)
Back Exercises These exercises help to make your trunk and back strong. They also help to keep the lower back flexible. Doing these exercises can help to prevent or lessen pain in your lower back. If you have back pain, try to do these exercises 2-3 times each day or as told by your doctor. As you get better, do the exercises once each day. Repeat the exercises more often as told by your doctor. To stop back pain from coming back, do the exercises once each day, or as told by your doctor. Do exercises exactly as told by your doctor. Stop right away if you feel sudden pain or your pain gets worse. Exercises Single knee to chest Do these steps 3-5 times in a row for each leg: Lie on your back on a firm bed or the floor with your legs stretched out. Bring one knee to your chest. Grab your knee or thigh with both hands and hold it in place. Pull on your knee until you feel a gentle stretch in your lower back or butt. Keep doing the stretch for 10-30 seconds. Slowly let go of your leg and straighten it. Pelvic tilt Do these steps 5-10 times in a row: Lie on your back on a firm bed or the floor with your legs stretched out. Bend your knees so they point up to the ceiling. Your feet should be flat on the floor. Tighten your lower belly (abdomen) muscles to press your lower back against the floor. This will make your tailbone point up to the ceiling instead of pointing down to your feet or the floor. Stay in this position for 5-10 seconds while you gently tighten your muscles and breathe evenly. Cat-cow Do these steps until your lower back bends more easily: Get on your hands and knees on a firm bed or the floor. Keep your hands under your shoulders, and keep your knees under your hips. You may put padding under your knees. Let your head hang down toward your chest. Tighten (contract) the muscles in your belly. Point your tailbone toward the floor so your lower back becomes rounded like the back of a  cat. Stay in this position for 5 seconds. Slowly lift your head. Let the muscles of your belly relax. Point your tailbone up toward the ceiling so your back forms a sagging arch like the back of a cow. Stay in this position for 5 seconds.  Press-ups Do these steps 5-10 times in a row: Lie on your belly (face-down) on a firm bed or the floor. Place your hands near your head, about shoulder-width apart. While you keep your back relaxed and keep your hips on the floor, slowly straighten your arms to raise the top half of your body and lift your shoulders. Do not use your back muscles. You may change where you place your hands to make yourself more comfortable. Stay in this position for 5 seconds. Keep your back relaxed. Slowly return to lying flat on the floor.  Bridges Do these steps 10 times in a row: Lie on your back on a firm bed or the floor. Bend your knees so they point up to the ceiling. Your feet should be flat on the floor. Your arms should be flat at your sides, next to your body. Tighten your butt muscles and lift your butt off the floor until your waist is almost as high as your knees. If you do not feel the muscles working in your butt and the back of   your thighs, slide your feet 1-2 inches (2.5-5 cm) farther away from your butt. Stay in this position for 3-5 seconds. Slowly lower your butt to the floor, and let your butt muscles relax. If this exercise is too easy, try doing it with your arms crossed over your chest. Belly crunches Do these steps 5-10 times in a row: Lie on your back on a firm bed or the floor with your legs stretched out. Bend your knees so they point up to the ceiling. Your feet should be flat on the floor. Cross your arms over your chest. Tip your chin a little bit toward your chest, but do not bend your neck. Tighten your belly muscles and slowly raise your chest just enough to lift your shoulder blades a tiny bit off the floor. Avoid raising your body  higher than that because it can put too much stress on your lower back. Slowly lower your chest and your head to the floor. Back lifts Do these steps 5-10 times in a row: Lie on your belly (face-down) with your arms at your sides, and rest your forehead on the floor. Tighten the muscles in your legs and your butt. Slowly lift your chest off the floor while you keep your hips on the floor. Keep the back of your head in line with the curve in your back. Look at the floor while you do this. Stay in this position for 3-5 seconds. Slowly lower your chest and your face to the floor. Contact a doctor if: Your back pain gets a lot worse when you do an exercise. Your back pain does not get better within 2 hours after you exercise. If you have any of these problems, stop doing the exercises. Do not do them again unless your doctor says it is okay. Get help right away if: You have sudden, very bad back pain. If this happens, stop doing the exercises. Do not do them again unless your doctor says it is okay. This information is not intended to replace advice given to you by your health care provider. Make sure you discuss any questions you have with your health care provider. Document Revised: 04/07/2020 Document Reviewed: 04/07/2020 Elsevier Patient Education  2024 Elsevier Inc.  

## 2022-12-13 ENCOUNTER — Telehealth: Payer: Self-pay

## 2022-12-13 NOTE — Telephone Encounter (Signed)
Eric Dickerson has requested a return to work note, ASAP.  He said he went back to work on 11/10/2022. If granted he will pick up the note here at the office.   Call back phone [phone].

## 2022-12-14 ENCOUNTER — Encounter: Payer: Self-pay | Admitting: Physical Medicine & Rehabilitation

## 2022-12-26 NOTE — Progress Notes (Deleted)
  PROCEDURE RECORD [facility]   Name: Eric Dickerson DOB:[birth year] MRN: [id number]  Date:12/26/2022  Physician: Claudette Laws, MD    Nurse/CMA: Charise Carwin MA  Allergies:  Allergies  Allergen Reactions   Accupril [Quinapril Hcl] Cough   Ace Inhibitors Other (See Comments)    ACE inhibitors are contraindicated because of a history of rash with Angiotensin receptor blocker.   Calan [Verapamil] Other (See Comments)    Sun sensitivity   Crestor [Rosuvastatin] Other (See Comments)    Myalgias Cramps   Glucosamine Other (See Comments)    Unknown reaction   Lipitor [Atorvastatin] Other (See Comments)    Myalgias  Cramps   Livalo [Pitavastatin] Other (See Comments)    Cramps   Pravachol [Pravastatin] Other (See Comments)    Myalgias Cramps   Statins Other (See Comments)    Myalgias, cramping with: Crestor, Lipitor, Livalo, Pravachol.   Zetia [Ezetimibe] Other (See Comments)    Myalgias Cramps  Pt ok on 3x weekly dose   Benicar [Olmesartan] Rash    Consent Signed: {yes no:314532}  Is patient diabetic? {yes no:314532}  CBG today? ***  Pregnant: {yes no:314532} LMP: No LMP for male patient. (age 57-55)  Anticoagulants: {Yes/No:19989} Anti-inflammatory: {Yes/No:19989} Antibiotics: {Yes/No:19989}  Procedure: Right l3,4,5 Medial Branch Block   Position: Prone Start Time: ***  End Time: ***  Fluoro Time: ***  RN/CMA Tracer Gutridge MA     Time      BP      Pulse      Respirations      O2 Sat      S/S      Pain Level       D/C home with ***, patient A & O X 3, D/C instructions reviewed, and sits independently.

## 2022-12-28 ENCOUNTER — Encounter: Payer: Medicare PPO | Admitting: Physical Medicine & Rehabilitation

## 2023-01-01 ENCOUNTER — Ambulatory Visit (INDEPENDENT_AMBULATORY_CARE_PROVIDER_SITE_OTHER): Payer: Medicare PPO

## 2023-01-01 VITALS — Ht 74.0 in | Wt 178.0 lb

## 2023-01-01 DIAGNOSIS — Z0000 Encounter for general adult medical examination without abnormal findings: Secondary | ICD-10-CM

## 2023-01-01 NOTE — Progress Notes (Signed)
Subjective:   Eric Dickerson is a 72 y.o. male who presents for an Initial Medicare Annual Wellness Visit.  Visit Complete: Virtual I connected with  Eric Dickerson on 01/01/23 by a audio enabled telemedicine application and verified that I am speaking with the correct person using two identifiers.  Patient Location: Home  Provider Location: Office/Clinic  I discussed the limitations of evaluation and management by telemedicine. The patient expressed understanding and agreed to proceed.  Vital Signs: Because this visit was a virtual/telehealth visit, some criteria may be missing or patient reported. Any vitals not documented were not able to be obtained and vitals that have been documented are patient reported.   Cardiac Risk Factors include: advanced age (>84men, >66 women);hypertension;male gender;Other (see comment);dyslipidemia, Risk factor comments: PAD, CAD, CVA, BPH, OAB     Objective:    Today's Vitals   01/01/23 1429  Weight: 178 lb (80.7 kg)  Height: 6\' 2"  (1.88 m)   Body mass index is 22.85 kg/m.     01/01/2023    2:41 PM 11/07/2022    1:17 PM 10/12/2022    5:06 PM 10/10/2022   11:34 AM 08/07/2017   12:52 PM 04/24/2012    8:00 AM 04/23/2012    8:00 PM  Advanced Directives  Does Patient Have a Medical Advance Directive? No No No No No Patient does not have advance directive   Would patient like information on creating a medical advance directive?   No - Patient declined No - Patient declined     Pre-existing out of facility DNR order (yellow form or pink MOST form)      No No    Current Medications (verified) Outpatient Encounter Medications as of 01/01/2023  Medication Sig   amLODipine (NORVASC) 5 MG tablet Take 1 tablet (5 mg total) by mouth daily.   aspirin EC 81 MG tablet Take 1 tablet (81 mg total) by mouth daily. Swallow whole.   clopidogrel (PLAVIX) 75 MG tablet Take 1 tablet (75 mg total) by mouth daily.   meclizine (ANTIVERT) 12.5 MG tablet Take 1 tablet  (12.5 mg total) by mouth 3 (three) times daily as needed for dizziness.   Multiple Vitamins-Minerals (CENTRUM SILVER 50+MEN) TABS Take 1 tablet by mouth daily.   nitroGLYCERIN (NITROSTAT) 0.4 MG SL tablet Take 1 tab under your tongue for chest pain  If no relief of pain may repeat NTG, one tab every 5 minutes up to 3 tablets total over 15 minutes.   rosuvastatin (CRESTOR) 20 MG tablet Take 1 tablet (20 mg total) by mouth daily.   tamsulosin (FLOMAX) 0.4 MG CAPS capsule Take 0.4 mg by mouth at bedtime.   No facility-administered encounter medications on file as of 01/01/2023.    Allergies (verified) Accupril [quinapril hcl], Ace inhibitors, Calan [verapamil], Crestor [rosuvastatin], Glucosamine, Lipitor [atorvastatin], Livalo [pitavastatin], Pravachol [pravastatin], Statins, Zetia [ezetimibe], and Benicar [olmesartan]   History: Past Medical History:  Diagnosis Date   ANEMIA, MILD    CAD (coronary artery disease)    a. CAD,  b. s/p Cypher DES to the ramus and Promus DES x 2 to the RCA 08/2007 (minimal disease in LAD and CFX at that time), c. Myoview 8/10: EF 63%, inf thinning, small prior ant infarct, no ischemia;  d. s/p CABG 3/14 (L-LAD, RIMA-OM1)   DEGENERATIVE JOINT DISEASE, RIGHT HIP    DIVERTICULOSIS, COLON    ERECTILE DYSFUNCTION    GERD    Heart murmur    HIATAL HERNIA  HTN (hypertension)    Hyperlipidemia    statin intolerant   HYPERPLASIA PROSTATE UNS W/O UR OBST & OTH LUTS    LUMBAR RADICULOPATHY, RIGHT    Other abnormal glucose    PAD (peripheral artery disease) (HCC)    pre CABG 04/2012 => ABIs: Right 0.92, left 0.61 // ABI 09/02/14: R 0.93; L 0.82   SPINAL STENOSIS, LUMBAR    Past Surgical History:  Procedure Laterality Date   COLONOSCOPY  02/07/2000   negative   COLONOSCOPY  08/2017   TA, Dr Myrtie Neither   CORONARY ARTERY BYPASS GRAFT N/A 04/22/2012   Procedure: CORONARY ARTERY BYPASS GRAFTING (CABG);  Surgeon: Loreli Slot, MD;  Location: [facility] OR;  Service: Open  Heart Surgery;  Laterality: N/A;  CABG X 2, BIMA, POSSIBLE EVH   CORONARY STENT PLACEMENT  02/07/2008    X 2   TEE WITHOUT CARDIOVERSION N/A 04/22/2012   Procedure: TRANSESOPHAGEAL ECHOCARDIOGRAM (TEE);  Surgeon: Loreli Slot, MD;  Location[facility];  Service: Open Heart Surgery;  Laterality: N/A;   TOTAL HIP ARTHROPLASTY Right 02/07/2007   UPPER GASTROINTESTINAL ENDOSCOPY     Family History  Problem Relation Age of Onset   Hyperlipidemia Mother    Hypertension Father    Heart attack Father 41       smoker   Stroke Brother        2 brothers ; 40 & 16   Hyperlipidemia Brother    Pulmonary embolism Brother    Heart attack Brother 59   Diabetes Maternal Grandmother    Cancer Neg Hx    COPD Neg Hx    Colon cancer Neg Hx    Esophageal cancer Neg Hx    Stomach cancer Neg Hx    Rectal cancer Neg Hx    Colon polyps Neg Hx    Social History   Socioeconomic History   Marital status: Married    Spouse name: Karena Addison   Number of children: 4   Years of education: 12   Highest education level: Not on file  Occupational History   Occupation: Facilities Management  Tobacco Use   Smoking status: Former    Current packs/day: 0.00    Average packs/day: 1.5 packs/day for 12.0 years (18.0 ttl pk-yrs)    Types: Cigarettes    Start date: 02/06/1973    Quit date: 02/06/1985    Years since quitting: 37.9   Smokeless tobacco: Never   Tobacco comments:    smoked 1967-1987, up to 1.5 ppd  Vaping Use   Vaping status: Never Used  Substance and Sexual Activity   Alcohol use: Never   Drug use: Never   Sexual activity: Not on file  Other Topics Concern   Not on file  Social History Narrative   Denies abuse and feels safe at home. Lives with wife.   Social Determinants of Health   Financial Resource Strain: Low Risk  (01/01/2023)   Overall Financial Resource Strain (CARDIA)    Difficulty of Paying Living Expenses: Not very hard  Food Insecurity: No Food Insecurity (01/01/2023)   Hunger  Vital Sign    Worried About Running Out of Food in the Last Year: Never true    Ran Out of Food in the Last Year: Never true  Transportation Needs: No Transportation Needs (01/01/2023)   PRAPARE - Administrator, Civil Service (Medical): No    Lack of Transportation (Non-Medical): No  Physical Activity: Inactive (01/01/2023)   Exercise Vital Sign  Days of Exercise per Week: 0 days    Minutes of Exercise per Session: 0 min  Stress: No Stress Concern Present (01/01/2023)   [employer] of Occupational Health - Occupational Stress Questionnaire    Feeling of Stress : Not at all  Social Connections: Moderately Isolated (01/01/2023)   Social Connection and Isolation Panel [NHANES]    Frequency of Communication with Friends and Family: Twice a week    Frequency of Social Gatherings with Friends and Family: Twice a week    Attends Religious Services: Never    Database administrator or Organizations: No    Attends Banker Meetings: Never    Marital Status: Married    Tobacco Counseling Counseling given: Not Answered Tobacco comments: smoked [phone], up to 1.5 ppd   Clinical Intake:  Pre-visit preparation completed: Yes  Pain : No/denies pain     BMI - recorded: 22.85 Nutritional Status: BMI of 19-24  Normal Nutritional Risks: None Diabetes: No  How often do you need to have someone help you when you read instructions, pamphlets, or other written materials from your doctor or pharmacy?: 1 - Never  Interpreter Needed?: No  Information entered by :: Izaias Krupka, RMA   Activities of Daily Living    01/01/2023    2:30 PM 10/12/2022    5:08 PM  In your present state of health, do you have any difficulty performing the following activities:  Hearing? 0   Vision? 0   Difficulty concentrating or making decisions? 0   Walking or climbing stairs? 0   Dressing or bathing? 0   Doing errands, shopping? 0 0  Preparing Food and eating ? N   Using  the Toilet? N   In the past six months, have you accidently leaked urine? N   Do you have problems with loss of bowel control? N   Managing your Medications? N   Managing your Finances? N   Housekeeping or managing your Housekeeping? N     Patient Care Team: [name], MD as PCP - General (Internal Medicine) [name], MD as PCP - Cardiology (Cardiology) [name], MD as Attending Physician (Cardiology) Loreli Slot, MD as Attending Physician (Cardiothoracic Surgery) [facility]  Indicate any recent Medical Services you may have received from other than [facility] providers in the past year (date may be approximate).     Assessment:   This is a routine wellness examination for Djon.  Hearing/Vision screen Hearing Screening - Comments:: Denies hearing difficulties   Vision Screening - Comments:: Wears eyeglasses   Goals Addressed               This Visit's Progress     Patient Stated (pt-stated)        Take it one day at a time      Depression Screen    01/01/2023    2:45 PM 11/28/2022   12:54 PM 08/07/2022    3:53 PM 03/28/2022    2:07 PM 03/24/2021    2:00 PM 03/24/2021    1:41 PM 08/31/2020    8:49 AM  PHQ 2/9 Scores  PHQ - 2 Score 0 0 0 0 0 1 0  PHQ- 9 Score 0 0  0       Fall Risk    01/01/2023    2:41 PM 11/28/2022   12:53 PM 08/07/2022    3:53 PM 03/28/2022    2:07 PM 03/24/2021    2:00 PM  Fall Risk   Falls in the past year? 0 0 0 0 0  Number falls in past yr: 0 0 0 0 0  Injury with Fall? 0 0 0 0 0  Risk for fall due to : No Fall Risks  No Fall Risks No Fall Risks   Follow up Falls prevention discussed;Falls evaluation completed  Falls evaluation completed Falls evaluation completed     MEDICARE RISK AT HOME: Medicare Risk at Home Any stairs in or around the home?: No Home free of loose throw rugs in walkways, pet beds, electrical cords, etc?: Yes Adequate lighting in your home to reduce risk of falls?: Yes Life  alert?: No Use of a cane, walker or w/c?: No Grab bars in the bathroom?: Yes Shower chair or bench in shower?: Yes Elevated toilet seat or a handicapped toilet?: Yes  TIMED UP AND GO:  Was the test performed? No    Cognitive Function:        01/01/2023    2:31 PM  6CIT Screen  What Year? 0 points  What month? 0 points  What time? 0 points  Count back from 20 2 points  Months in reverse 4 points  Repeat phrase 0 points  Total Score 6 points    Immunizations Immunization History  Administered Date(s) Administered   PFIZER(Purple Top)SARS-COV-2 Vaccination 03/30/2019, 04/22/2019   Td 11/15/2009    TDAP status: Due, Education has been provided regarding the importance of this vaccine. Advised may receive this vaccine at local pharmacy or Health Dept. Aware to provide a copy of the vaccination record if obtained from local pharmacy or Health Dept. Verbalized acceptance and understanding.  Flu Vaccine status: Declined, Education has been provided regarding the importance of this vaccine but patient still declined. Advised may receive this vaccine at local pharmacy or Health Dept. Aware to provide a copy of the vaccination record if obtained from local pharmacy or Health Dept. Verbalized acceptance and understanding.  Pneumococcal vaccine status: Declined,  Education has been provided regarding the importance of this vaccine but patient still declined. Advised may receive this vaccine at local pharmacy or Health Dept. Aware to provide a copy of the vaccination record if obtained from local pharmacy or Health Dept. Verbalized acceptance and understanding.   Covid-19 vaccine status: Declined, Education has been provided regarding the importance of this vaccine but patient still declined. Advised may receive this vaccine at local pharmacy or Health Dept.or vaccine clinic. Aware to provide a copy of the vaccination record if obtained from local pharmacy or Health Dept. Verbalized  acceptance and understanding.  Qualifies for Shingles Vaccine? Yes   Zostavax completed  Declined   Shingrix Completed?: No.    Education has been provided regarding the importance of this vaccine. Patient has been advised to call insurance company to determine out of pocket expense if they have not yet received this vaccine. Advised may also receive vaccine at local pharmacy or Health Dept. Verbalized acceptance and understanding.  Screening Tests Health Maintenance  Topic Date Due   Pneumonia Vaccine 59+ Years old (1 of 1 - PCV) 03/29/2023 (Originally 08/10/2014)   Zoster Vaccines- Shingrix (1 of 2) 04/03/2023 (Originally 08/10/1999)   DTaP/Tdap/Td (2 - Tdap) 01/01/2024 (Originally 11/16/2019)   Medicare Annual Wellness (AWV)  01/01/2024   Hepatitis C Screening  Completed   HPV VACCINES  Aged Out   INFLUENZA VACCINE  Discontinued   Colonoscopy  Discontinued   COVID-19 Vaccine  Discontinued    Health Maintenance  There are  no preventive care reminders to display for this patient.   Colorectal cancer screening: No longer required.   Lung Cancer Screening: (Low Dose CT Chest recommended if Age 67-80 years, 20 pack-year currently smoking OR have quit w/in 15years.) does not qualify.   Lung Cancer Screening Referral: N/A  Additional Screening:  Hepatitis C Screening: does qualify; Completed 03/29/2015  Vision Screening: Recommended annual ophthalmology exams for early detection of glaucoma and other disorders of the eye. Is the patient up to date with their annual eye exam?  No  Who is the provider or what is the name of the office in which the patient attends annual eye exams? [facility] If pt is not established with a provider, would they like to be referred to a provider to establish care? No .   Dental Screening: Recommended annual dental exams for proper oral hygiene   Community Resource Referral / Chronic Care Management: CRR required this visit?  No   CCM  required this visit?  No    Plan:     I have personally reviewed and noted the following in the patient's chart:   Medical and social history Use of alcohol, tobacco or illicit drugs  Current medications and supplements including opioid prescriptions. Patient is not currently taking opioid prescriptions. Functional ability and status Nutritional status Physical activity Advanced directives List of other physicians Hospitalizations, surgeries, and ER visits in previous 12 months Vitals Screenings to include cognitive, depression, and falls Referrals and appointments  In addition, I have reviewed and discussed with patient certain preventive protocols, quality metrics, and best practice recommendations. A written personalized care plan for preventive services as well as general preventive health recommendations were provided to patient.     Judit Awad L Erandi Lemma, CMA   01/01/2023   After Visit Summary: (MyChart) Due to this being a telephonic visit, the after visit summary with patients personalized plan was offered to patient via MyChart   Nurse Notes: Patient is due for a Tdap, however, patient declines all vaccines.  He is due for a yearly eye exam, which he is aware that he is due.  Patient just had a recent colonoscopy in July.  He has no concerns to address today.

## 2023-01-01 NOTE — Patient Instructions (Signed)
[name] , Thank you for taking time to come for your Medicare Wellness Visit. I appreciate your ongoing commitment to your health goals. Please review the following plan we discussed and let me know if I can assist you in the future.   Referrals/Orders/Follow-Ups/Clinician Recommendations: You are due for a yearly eye exam.  Please call to schedule one.  It was nice to talk with you today.  Keep up the good work.   This is a list of the screening recommended for you and due dates:  Health Maintenance  Topic Date Due   Pneumonia Vaccine (1 of 1 - PCV) 03/29/2023*   Zoster (Shingles) Vaccine (1 of 2) 04/03/2023*   DTaP/Tdap/Td vaccine (2 - Tdap) 01/01/2024*   Medicare Annual Wellness Visit  01/01/2024   Hepatitis C Screening  Completed   HPV Vaccine  Aged Out   Flu Shot  Discontinued   Colon Cancer Screening  Discontinued   COVID-19 Vaccine  Discontinued  *Topic was postponed. The date shown is not the original due date.    Advanced directives: (Declined) Advance directive discussed with you today. Even though you declined this today, please call our office should you change your mind, and we can give you the proper paperwork for you to fill out.  Next Medicare Annual Wellness Visit scheduled for next year: Yes

## 2023-01-15 ENCOUNTER — Ambulatory Visit: Payer: Medicare PPO | Admitting: Neurology

## 2023-01-15 ENCOUNTER — Encounter: Payer: Self-pay | Admitting: Neurology

## 2023-01-15 VITALS — BP 136/89 | HR 69 | Ht 74.0 in | Wt 178.0 lb

## 2023-01-15 DIAGNOSIS — I6502 Occlusion and stenosis of left vertebral artery: Secondary | ICD-10-CM | POA: Diagnosis not present

## 2023-01-15 DIAGNOSIS — E7849 Other hyperlipidemia: Secondary | ICD-10-CM | POA: Diagnosis not present

## 2023-01-15 DIAGNOSIS — I639 Cerebral infarction, unspecified: Secondary | ICD-10-CM

## 2023-01-15 NOTE — Patient Instructions (Signed)
I had a long d/w patient about his recent cerebellar stroke, vertebral artery occlusion, risk for recurrent stroke/TIAs, personally independently reviewed imaging studies and stroke evaluation results and answered questions.Continue Plavix 75 mg daily alone now and discontinue aspirin for secondary stroke prevention and maintain strict control of hypertension with blood pressure goal below 130/90, diabetes with hemoglobin A1c goal below 6.5% and lipids with LDL cholesterol goal below 70 mg/dL. I also advised the patient to eat a healthy diet with plenty of whole grains, cereals, fruits and vegetables, exercise regularly and maintain ideal body weight check follow-up lipid profile and if not satisfactory may consider adding Zetia or Repatha.  Followup in the future with my nurse practitioner in 6 months or call earlier if necessary.  Stroke Prevention Some medical conditions and behaviors can lead to a higher chance of having a stroke. You can help prevent a stroke by eating healthy, exercising, not smoking, and managing any medical conditions you have. Stroke is a leading cause of functional impairment. Primary prevention is particularly important because a majority of strokes are first-time events. Stroke changes the lives of not only those who experience a stroke but also their family and other caregivers. How can this condition affect me? A stroke is a medical emergency and should be treated right away. A stroke can lead to brain damage and can sometimes be life-threatening. If a person gets medical treatment right away, there is a better chance of surviving and recovering from a stroke. What can increase my risk? The following medical conditions may increase your risk of a stroke: Cardiovascular disease. High blood pressure (hypertension). Diabetes. High cholesterol. Sickle cell disease. Blood clotting disorders (hypercoagulable state). Obesity. Sleep disorders (obstructive sleep apnea). Other  risk factors include: Being older than age 64. Having a history of blood clots, stroke, or mini-stroke (transient ischemic attack, TIA). Genetic factors, such as race, ethnicity, or a family history of stroke. Smoking cigarettes or using other tobacco products. Taking birth control pills, especially if you also use tobacco. Heavy use of alcohol or drugs, especially cocaine and methamphetamine. Physical inactivity. What actions can I take to prevent this? Manage your health conditions High cholesterol levels. Eating a healthy diet is important for preventing high cholesterol. If cholesterol cannot be managed through diet alone, you may need to take medicines. Take any prescribed medicines to control your cholesterol as told by your health care provider. Hypertension. To reduce your risk of stroke, try to keep your blood pressure below 130/80. Eating a healthy diet and exercising regularly are important for controlling blood pressure. If these steps are not enough to manage your blood pressure, you may need to take medicines. Take any prescribed medicines to control hypertension as told by your health care provider. Ask your health care provider if you should monitor your blood pressure at home. Have your blood pressure checked every year, even if your blood pressure is normal. Blood pressure increases with age and some medical conditions. Diabetes. Eating a healthy diet and exercising regularly are important parts of managing your blood sugar (glucose). If your blood sugar cannot be managed through diet and exercise, you may need to take medicines. Take any prescribed medicines to control your diabetes as told by your health care provider. Get evaluated for obstructive sleep apnea. Talk to your health care provider about getting a sleep evaluation if you snore a lot or have excessive sleepiness. Make sure that any other medical conditions you have, such as atrial fibrillation or  atherosclerosis, are  managed. Nutrition Follow instructions from your health care provider about what to eat or drink to help manage your health condition. These instructions may include: Reducing your daily calorie intake. Limiting how much salt (sodium) you use to 1,500 milligrams (mg) each day. Using only healthy fats for cooking, such as olive oil, canola oil, or sunflower oil. Eating healthy foods. You can do this by: Choosing foods that are high in fiber, such as whole grains, and fresh fruits and vegetables. Eating at least 5 servings of fruits and vegetables a day. Try to fill one-half of your plate with fruits and vegetables at each meal. Choosing lean protein foods, such as lean cuts of meat, poultry without skin, fish, tofu, beans, and nuts. Eating low-fat dairy products. Avoiding foods that are high in sodium. This can help lower blood pressure. Avoiding foods that have saturated fat, trans fat, and cholesterol. This can help prevent high cholesterol. Avoiding processed and prepared foods. Counting your daily carbohydrate intake.  Lifestyle If you drink alcohol: Limit how much you have to: 0-1 drink a day for women who are not pregnant. 0-2 drinks a day for men. Know how much alcohol is in your drink. In the [place] one drink equals one 12 oz bottle of beer ( ), one 5 oz glass of wine ( ), or one 1 oz glass of hard liquor (44mL). Do not use any products that contain nicotine or tobacco. These products include cigarettes, chewing tobacco, and vaping devices, such as e-cigarettes. If you need help quitting, ask your health care provider. Avoid secondhand smoke. Do not use drugs. Activity  Try to stay at a healthy weight. Get at least 30 minutes of exercise on most days, such as: Fast walking. Biking. Swimming. Medicines Take over-the-counter and prescription medicines only as told by your health care provider. Aspirin or blood thinners (antiplatelets or  anticoagulants) may be recommended to reduce your risk of forming blood clots that can lead to stroke. Avoid taking birth control pills. Talk to your health care provider about the risks of taking birth control pills if: You are over 27 years old. You smoke. You get very bad headaches. You have had a blood clot. Where to find more information American Stroke Association: www.strokeassociation.org Get help right away if: You or a loved one has any symptoms of a stroke. "BE FAST" is an easy way to remember the main warning signs of a stroke: B - Balance. Signs are dizziness, sudden trouble walking, or loss of balance. E - Eyes. Signs are trouble seeing or a sudden change in vision. F - Face. Signs are sudden weakness or numbness of the face, or the face or eyelid drooping on one side. A - Arms. Signs are weakness or numbness in an arm. This happens suddenly and usually on one side of the body. S - Speech. Signs are sudden trouble speaking, slurred speech, or trouble understanding what people say. T - Time. Time to call emergency services. Write down what time symptoms started. You or a loved one has other signs of a stroke, such as: A sudden, severe headache with no known cause. Nausea or vomiting. Seizure. These symptoms may represent a serious problem that is an emergency. Do not wait to see if the symptoms will go away. Get medical help right away. Call your local emergency services (911 in the [place] Do not drive yourself to the hospital. Summary You can help to prevent a stroke by eating healthy, exercising, not smoking, limiting alcohol intake, and managing  any medical conditions you may have. Do not use any products that contain nicotine or tobacco. These include cigarettes, chewing tobacco, and vaping devices, such as e-cigarettes. If you need help quitting, ask your health care provider. Remember "BE FAST" for warning signs of a stroke. Get help right away if you or a loved one has any  of these signs. This information is not intended to replace advice given to you by your health care provider. Make sure you discuss any questions you have with your health care provider. Document Revised: 12/26/2021 Document Reviewed: 12/26/2021 Elsevier Patient Education  2024 [employer].

## 2023-01-15 NOTE — Progress Notes (Signed)
[facility] [address] [place]. [place] [address] [phone]       OFFICE FOLLOW-UP NOTE  [name] Date of Birth:  [birth year] Medical Record Number:  [id number]   HPI: Eric Dickerson is a 72 year old African-American male seen today for initial office follow-up visit following hospital consultation for stroke in September 2024.  History is obtained from the patient and review of electronic medical records.  I have personally reviewed pertinent medical and hospital records, imaging films in PACS and hospital workup.  Patient has past medical history of coronary artery disease, hypertension, hyperlipidemia, peripheral arterial disease.  He presented on 10/10/2022 with sudden onset of feeling of dizziness and room spinning sensation he started while at work.  He was evaluated in the emergency room for vertigo and MRI scan was done which showed left cerebellar infarcts and CT angiogram showed left vertebral artery occlusion from a plaque versus dissection at V4 junction.  There is also right P2, right M1, bilateral carotid siphon and right vertebral origin stenosis noted.  2D echo showed ejection fraction of 60 to 65% without cardiac source of embolism.  LDL cholesterol was 152 mg percent.  Hemoglobin A1c was 5.7.  Urine drug screen was negative.  Patient was started on dual antiplatelet therapy aspirin and Plavix for 3 months followed by aspirin alone.  Patient was seen by physical Occupational Therapy and transferred to inpatient rehab where he did well.  He has been discharged home and is doing well now able to ambulate independently without assistance.  He is tolerating aspirin and Plavix well but states that aspirin causes stomach irritation and he does not take it every day.  He is tolerating Crestor 20 mg well without side effects.  He was considered for [facility] but patient refused.  He says he is almost back to baseline.  He has no complaints today.  ROS:   14 system review  of systems is positive for dizziness, gait difficulty, bruising all other systems negative  PMH:  Past Medical History:  Diagnosis Date   ANEMIA, MILD    CAD (coronary artery disease)    a. CAD,  b. s/p Cypher DES to the ramus and Promus DES x 2 to the RCA 08/2007 (minimal disease in LAD and CFX at that time), c. Myoview 8/10: EF 63%, inf thinning, small prior ant infarct, no ischemia;  d. s/p CABG 3/14 (L-LAD, RIMA-OM1)   DEGENERATIVE JOINT DISEASE, RIGHT HIP    DIVERTICULOSIS, COLON    ERECTILE DYSFUNCTION    GERD    Heart murmur    HIATAL HERNIA    HTN (hypertension)    Hyperlipidemia    statin intolerant   HYPERPLASIA PROSTATE UNS W/O UR OBST & OTH LUTS    LUMBAR RADICULOPATHY, RIGHT    Other abnormal glucose    PAD (peripheral artery disease) (HCC)    pre CABG 04/2012 => ABIs: Right 0.92, left 0.61 // ABI 09/02/14: R 0.93; L 0.82   SPINAL STENOSIS, LUMBAR     Social History:  Social History   Socioeconomic History   Marital status: Married    Spouse name: Karena Addison   Number of children: 4   Years of education: 12   Highest education level: Not on file  Occupational History   Occupation: Facilities Management  Tobacco Use   Smoking status: Former    Current packs/day: 0.00    Average packs/day: 1.5 packs/day for 12.0 years (18.0 ttl pk-yrs)    Types: Cigarettes  Start date: 02/06/1973    Quit date: 02/06/1985    Years since quitting: 37.9   Smokeless tobacco: Never   Tobacco comments:    smoked [phone], up to 1.5 ppd  Vaping Use   Vaping status: Never Used  Substance and Sexual Activity   Alcohol use: Never   Drug use: Never   Sexual activity: Not on file  Other Topics Concern   Not on file  Social History Narrative   Denies abuse and feels safe at home. Lives with wife.   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    Social Determinants of Corporate investment banker Strain: Low Risk  (01/01/2023)   Overall Financial Resource Strain (CARDIA)    Difficulty of Paying Living  Expenses: Not very hard  Food Insecurity: No Food Insecurity (01/01/2023)   Hunger Vital Sign    Worried About Running Out of Food in the Last Year: Never true    Ran Out of Food in the Last Year: Never true  Transportation Needs: No Transportation Needs (01/01/2023)   PRAPARE - Administrator, Civil Service (Medical): No    Lack of Transportation (Non-Medical): No  Physical Activity: Inactive (01/01/2023)   Exercise Vital Sign    Days of Exercise per Week: 0 days    Minutes of Exercise per Session: 0 min  Stress: No Stress Concern Present (01/01/2023)   [employer] of Occupational Health - Occupational Stress Questionnaire    Feeling of Stress : Not at all  Social Connections: Moderately Isolated (01/01/2023)   Social Connection and Isolation Panel [NHANES]    Frequency of Communication with Friends and Family: Twice a week    Frequency of Social Gatherings with Friends and Family: Twice a week    Attends Religious Services: Never    Database administrator or Organizations: No    Attends Banker Meetings: Never    Marital Status: Married  Catering manager Violence: Not At Risk (01/01/2023)   Humiliation, Afraid, Rape, and Kick questionnaire    Fear of Current or Ex-Partner: No    Emotionally Abused: No    Physically Abused: No    Sexually Abused: No    Medications:   Current Outpatient Medications on File Prior to Visit  Medication Sig Dispense Refill   amLODipine (NORVASC) 5 MG tablet Take 1 tablet (5 mg total) by mouth daily. 90 tablet 3   aspirin EC 81 MG tablet Take 1 tablet (81 mg total) by mouth daily. Swallow whole. 120 tablet 0   clopidogrel (PLAVIX) 75 MG tablet Take 1 tablet (75 mg total) by mouth daily. 90 tablet 3   meclizine (ANTIVERT) 12.5 MG tablet Take 1 tablet (12.5 mg total) by mouth 3 (three) times daily as needed for dizziness. (Patient taking differently: Take 12.5 mg by mouth as needed for dizziness.) 40 tablet 1   Multiple  Vitamins-Minerals (CENTRUM SILVER 50+MEN) TABS Take 1 tablet by mouth daily.     nitroGLYCERIN (NITROSTAT) 0.4 MG SL tablet Take 1 tab under your tongue for chest pain  If no relief of pain may repeat NTG, one tab every 5 minutes up to 3 tablets total over 15 minutes. 25 tablet 3   rosuvastatin (CRESTOR) 20 MG tablet Take 1 tablet (20 mg total) by mouth daily. (Patient not taking: Reported on 01/15/2023) 90 tablet 3   tamsulosin (FLOMAX) 0.4 MG CAPS capsule Take 0.4 mg by mouth at bedtime. (Patient not taking: Reported on 01/15/2023)     No current  facility-administered medications on file prior to visit.    Allergies:   Allergies  Allergen Reactions   Accupril [Quinapril Hcl] Cough   Ace Inhibitors Other (See Comments)    ACE inhibitors are contraindicated because of a history of rash with Angiotensin receptor blocker.   Calan [Verapamil] Other (See Comments)    Sun sensitivity   Crestor [Rosuvastatin] Other (See Comments)    Myalgias Cramps   Glucosamine Other (See Comments)    Unknown reaction   Lipitor [Atorvastatin] Other (See Comments)    Myalgias  Cramps   Livalo [Pitavastatin] Other (See Comments)    Cramps   Pravachol [Pravastatin] Other (See Comments)    Myalgias Cramps   Statins Other (See Comments)    Myalgias, cramping with: Crestor, Lipitor, Livalo, Pravachol.   Zetia [Ezetimibe] Other (See Comments)    Myalgias Cramps  Pt ok on 3x weekly dose   Benicar [Olmesartan] Rash    Physical Exam General: well developed, well nourished pleasant elderly African-American male, seated, in no evident distress Head: head normocephalic and atraumatic.  Neck: supple with no carotid or supraclavicular bruits Cardiovascular: regular rate and rhythm, no murmurs Musculoskeletal: no deformity Skin:  no rash/petichiae Vascular:  Normal pulses all extremities Vitals:   01/15/23 1138  BP: 136/89  Pulse: 69   Neurologic Exam Mental Status: Awake and fully alert. Oriented to  place and time. Recent and remote memory intact. Attention span, concentration and fund of knowledge appropriate. Mood and affect appropriate.  Cranial Nerves: Fundoscopic exam reveals sharp disc margins. Pupils equal, briskly reactive to light. Extraocular movements full without nystagmus. Visual fields full to confrontation. Hearing intact. Facial sensation intact. Face, tongue, palate moves normally and symmetrically.  Motor: Normal bulk and tone. Normal strength in all tested extremity muscles. Sensory.: intact to touch ,pinprick .position and vibratory sensation.  Coordination: Rapid alternating movements normal in all extremities. Finger-to-nose and heel-to-shin performed accurately bilaterally. Gait and Station: Arises from chair without difficulty. Stance is normal. Gait demonstrates normal stride length and balance . Able to heel, toe and tandem walk with slight difficulty.  Reflexes: 1+ and symmetric. Toes downgoing.   NIHSS  0 Modified Rankin  1   ASSESSMENT: 72 year old African-American male with left cerebellar infarct in September 2024 due to left intracranial vertebral artery occlusion.  He is doing well with practically no residual deficits.  Vascular risk factors of hyperlipidemia, coronary artery disease, peripheral arterial disease and hypertension     PLAN:I had a long d/w patient about his recent cerebellar stroke, vertebral artery occlusion, risk for recurrent stroke/TIAs, personally independently reviewed imaging studies and stroke evaluation results and answered questions.Continue Plavix 75 mg daily alone now and discontinue aspirin for secondary stroke prevention and maintain strict control of hypertension with blood pressure goal below 130/90, diabetes with hemoglobin A1c goal below 6.5% and lipids with LDL cholesterol goal below 70 mg/dL. I also advised the patient to eat a healthy diet with plenty of whole grains, cereals, fruits and vegetables, exercise regularly and  maintain ideal body weight check follow-up lipid profile and if not satisfactory may consider adding Zetia or Repatha.  Followup in the future with my nurse practitioner in 6 months or call earlier if necessary.  Greater than 50% of time during this 35 minute visit was spent on counseling,explanation of diagnosis of cerebellar stroke and vertebral artery occlusion , planning of further management, discussion with patient and family and coordination of care Delia Heady, MD Note: This document was prepared with digital dictation  and possible smart Lobbyist. Any transcriptional errors that result from this process are unintentional

## 2023-01-26 DIAGNOSIS — R972 Elevated prostate specific antigen [PSA]: Secondary | ICD-10-CM | POA: Diagnosis not present

## 2023-01-26 DIAGNOSIS — N401 Enlarged prostate with lower urinary tract symptoms: Secondary | ICD-10-CM | POA: Diagnosis not present

## 2023-01-26 DIAGNOSIS — R3914 Feeling of incomplete bladder emptying: Secondary | ICD-10-CM | POA: Diagnosis not present

## 2023-02-08 ENCOUNTER — Encounter: Payer: Medicare PPO | Attending: Physical Medicine & Rehabilitation | Admitting: Physical Medicine & Rehabilitation

## 2023-02-08 ENCOUNTER — Encounter: Payer: Self-pay | Admitting: Physical Medicine & Rehabilitation

## 2023-02-08 VITALS — BP 167/83 | HR 57 | Ht 74.0 in | Wt 179.0 lb

## 2023-02-08 DIAGNOSIS — M47816 Spondylosis without myelopathy or radiculopathy, lumbar region: Secondary | ICD-10-CM | POA: Insufficient documentation

## 2023-02-08 MED ORDER — IOHEXOL 180 MG/ML  SOLN
2.0000 mL | Freq: Once | INTRAMUSCULAR | Status: AC
  Administered 2023-02-08: 2 mL via ORAL

## 2023-02-08 MED ORDER — LIDOCAINE HCL (PF) 2 % IJ SOLN
2.0000 mL | Freq: Once | INTRAMUSCULAR | Status: AC
Start: 2023-02-08 — End: 2023-02-08
  Administered 2023-02-08: 2 mL

## 2023-02-08 MED ORDER — LIDOCAINE HCL 1 % IJ SOLN
5.0000 mL | Freq: Once | INTRAMUSCULAR | Status: AC
Start: 2023-02-08 — End: 2023-02-08
  Administered 2023-02-08: 5 mL

## 2023-02-08 NOTE — Progress Notes (Signed)
  PROCEDURE RECORD  Physical Medicine and Rehabilitation   Name: Eric Dickerson DOB:06-15-1950 MRN: [id number]  Date:02/08/2023  Physician: Prentice Compton, MD    Nurse/CMA: Dallis Darden RN  Allergies:  Allergies  Allergen Reactions   Accupril [Quinapril Hcl] Cough   Ace Inhibitors Other (See Comments)    ACE inhibitors are contraindicated because of a history of rash with Angiotensin receptor blocker.   Calan  [Verapamil ] Other (See Comments)    Sun sensitivity   Crestor  [Rosuvastatin ] Other (See Comments)    Myalgias Cramps   Glucosamine Other (See Comments)    Unknown reaction   Lipitor [Atorvastatin] Other (See Comments)    Myalgias  Cramps   Livalo [Pitavastatin] Other (See Comments)    Cramps   Pravachol  [Pravastatin ] Other (See Comments)    Myalgias Cramps   Statins Other (See Comments)    Myalgias, cramping with: Crestor , Lipitor, Livalo, Pravachol .   Zetia  [Ezetimibe ] Other (See Comments)    Myalgias Cramps  Pt ok on 3x weekly dose   Benicar [Olmesartan] Rash    Consent Signed: Yes.    Is patient diabetic? No.  CBG today?   Pregnant: No. LMP: No LMP for male patient. (age 83-55)  Anticoagulants: yes (plavix ) Anti-inflammatory: no Antibiotics: no  Procedure: right L3-4-5 MBB  Position: Prone Start Time: 2:44p  End Time: 2:47 Fluoro Time: 34 sec  RN/CMA Designer, Multimedia    Time 227 256    BP 167/83 161/90    Pulse 57 56    Respirations 14 14    O2 Sat 97 98    S/S 6 6    Pain Level 7/10 010     D/C home with self, patient A & O X 3, D/C instructions reviewed, and sits independently.

## 2023-02-08 NOTE — Progress Notes (Signed)
 Right lumbar L3, L4 medial branch blocks and L5 dorsal ramus injection under fluoroscopic guidance  Indication: Right Lumbar pain which is not relieved by medication management or other conservative care and interfering with self-care and mobility.  Informed consent was obtained after describing risks and benefits of the procedure with the patient, this includes bleeding, bruising, infection, paralysis and medication side effects. The patient wishes to proceed and has given written consent. The patient was placed in a prone position. The lumbar area was marked and prepped with Betadine. One ML of 1% lidocaine  was injected into each of 3 areas into the skin and subcutaneous tissue. Then a 22-gauge 3.5 spinal needle was inserted targeting the junction of the Right S1 superior articular process and sacral ala junction. Needle was advanced under fluoroscopic guidance. Bone contact was made.Isovue 200 was injected x0.5 mL demonstrating no intravascular uptake. Then a solution containing 2% MPF lidocaine  was injected x0.5 mL. Then the Right L5 superior articular process in transverse process junction was targeted. Bone contact was made.Isovue 200 was injected x0.5 mL demonstrating no intravascular uptake. Then a solution containing 2% MPF lidocaine  was injected x0.5 mL. Then the Right L4 superior articular process in transverse process junction was targeted. Bone contact was made. Isovue 200 was injected x0.5 mL demonstrating no intravascular uptake. Then a solution containing2% MPF lidocaine  was injected x0.5 mL Patient tolerated procedure well. Post procedure instructions were given. Please refer to post procedure form.   Preinjection pain 7/10 Postinjection pain 0/10 Will repeat in 1 month assuming that pain has returned to evaluate whether he would be a good candidate for radiofrequency procedure.

## 2023-03-06 DIAGNOSIS — Z6833 Body mass index (BMI) 33.0-33.9, adult: Secondary | ICD-10-CM | POA: Diagnosis not present

## 2023-03-06 DIAGNOSIS — I10 Essential (primary) hypertension: Secondary | ICD-10-CM | POA: Diagnosis not present

## 2023-03-06 DIAGNOSIS — E669 Obesity, unspecified: Secondary | ICD-10-CM | POA: Diagnosis not present

## 2023-03-06 DIAGNOSIS — Z0000 Encounter for general adult medical examination without abnormal findings: Secondary | ICD-10-CM | POA: Diagnosis not present

## 2023-03-06 DIAGNOSIS — Z8673 Personal history of transient ischemic attack (TIA), and cerebral infarction without residual deficits: Secondary | ICD-10-CM | POA: Diagnosis not present

## 2023-03-09 DIAGNOSIS — H40033 Anatomical narrow angle, bilateral: Secondary | ICD-10-CM | POA: Diagnosis not present

## 2023-03-09 DIAGNOSIS — H2513 Age-related nuclear cataract, bilateral: Secondary | ICD-10-CM | POA: Diagnosis not present

## 2023-03-13 ENCOUNTER — Encounter: Payer: Self-pay | Admitting: Physical Medicine & Rehabilitation

## 2023-03-13 ENCOUNTER — Encounter: Payer: Medicare PPO | Attending: Physical Medicine & Rehabilitation | Admitting: Physical Medicine & Rehabilitation

## 2023-03-13 DIAGNOSIS — M47816 Spondylosis without myelopathy or radiculopathy, lumbar region: Secondary | ICD-10-CM | POA: Insufficient documentation

## 2023-03-13 NOTE — Patient Instructions (Signed)
Will hold off on injection , sent to PT, follow up in one month

## 2023-03-13 NOTE — Progress Notes (Signed)
  PROCEDURE RECORD [facility]   Name: Eric Dickerson DOB:[birth year] MRN: [id number]  Date:03/13/2023  Physician: Prentice Compton, MD    Nurse/CMA: Bodin Gorka RN  Allergies:  Allergies  Allergen Reactions   Accupril [Quinapril Hcl] Cough   Ace Inhibitors Other (See Comments)    ACE inhibitors are contraindicated because of a history of rash with Angiotensin receptor blocker.   Calan  [Verapamil ] Other (See Comments)    Sun sensitivity   Crestor  [Rosuvastatin ] Other (See Comments)    Myalgias Cramps   Glucosamine Other (See Comments)    Unknown reaction   Lipitor [Atorvastatin] Other (See Comments)    Myalgias  Cramps   Livalo [Pitavastatin] Other (See Comments)    Cramps   Pravachol  [Pravastatin ] Other (See Comments)    Myalgias Cramps   Statins Other (See Comments)    Myalgias, cramping with: Crestor , Lipitor, Livalo, Pravachol .   Zetia  [Ezetimibe ] Other (See Comments)    Myalgias Cramps  Pt ok on 3x weekly dose   Benicar [Olmesartan] Rash    Procedure cancelled

## 2023-03-13 NOTE — Progress Notes (Signed)
 Patient returns today he states from last visit he did not think that the injection was helpful.  His preinjection pain score 7/10 postinjection pain score was 0/10.  He states that his pain did worsen again in a few hours and then a couple days later got better again.  He states that it is difficult to tell when his pain is better and worse because he has good days and bad days.  Prior MRI of the lumbar spine dated 05/28/2019 did show mild neuroforaminal stenosis right L2-3 as well as moderate at L3-4 this can certainly mimic some of his pain.   We stated that because of this it is difficult to perform these medial branch blocks as a diagnostic procedure and get a clear-cut result. We discussed a trial of physical therapy and MD follow-up in 1 month.  If no improvement may consider repeat axial imaging

## 2023-03-26 ENCOUNTER — Ambulatory Visit: Payer: Medicare PPO | Attending: Physical Medicine & Rehabilitation | Admitting: Physical Therapy

## 2023-03-26 ENCOUNTER — Encounter: Payer: Self-pay | Admitting: Physical Therapy

## 2023-03-26 ENCOUNTER — Other Ambulatory Visit: Payer: Self-pay

## 2023-03-26 DIAGNOSIS — M6281 Muscle weakness (generalized): Secondary | ICD-10-CM | POA: Insufficient documentation

## 2023-03-26 DIAGNOSIS — M47816 Spondylosis without myelopathy or radiculopathy, lumbar region: Secondary | ICD-10-CM | POA: Diagnosis not present

## 2023-03-26 DIAGNOSIS — M5459 Other low back pain: Secondary | ICD-10-CM | POA: Diagnosis present

## 2023-03-26 DIAGNOSIS — R262 Difficulty in walking, not elsewhere classified: Secondary | ICD-10-CM | POA: Insufficient documentation

## 2023-03-26 NOTE — Patient Instructions (Signed)
Access Code: [facility] URL: [email or web address] Date: 03/26/2023 Prepared by: Rosana Hoes  Exercises - Clam with Resistance  - 1 x daily - 3 sets - 15 reps - Bridge  - 1 x daily - 3 sets - 10 reps - Modified Thomas Stretch  - 1 x daily - 3 reps - 60 seconds hold

## 2023-03-26 NOTE — Therapy (Signed)
OUTPATIENT PHYSICAL THERAPY EVALUATION   Patient Name: Eric Dickerson MRN: [id number] DOB:1950/09/12, 73 y.o., male Today's Date: 03/26/2023   END OF SESSION:  PT End of Session - 03/26/23 1416     Visit Number 1    Number of Visits 17    Date for PT Re-Evaluation 05/21/23    Authorization Type [employer] MCR    Progress Note Due on Visit 10    PT Start Time 1400    PT Stop Time 1445    PT Time Calculation (min) 45 min    Activity Tolerance Patient tolerated treatment well    Behavior During Therapy WFL for tasks assessed/performed             Past Medical History:  Diagnosis Date   ANEMIA, MILD    CAD (coronary artery disease)    a. CAD,  b. s/p Cypher DES to the ramus and Promus DES x 2 to the RCA 08/2007 (minimal disease in LAD and CFX at that time), c. Myoview 8/10: EF 63%, inf thinning, small prior ant infarct, no ischemia;  d. s/p CABG 3/14 (L-LAD, RIMA-OM1)   DEGENERATIVE JOINT DISEASE, RIGHT HIP    DIVERTICULOSIS, COLON    ERECTILE DYSFUNCTION    GERD    Heart murmur    HIATAL HERNIA    HTN (hypertension)    Hyperlipidemia    statin intolerant   HYPERPLASIA PROSTATE UNS W/O UR OBST & OTH LUTS    LUMBAR RADICULOPATHY, RIGHT    Other abnormal glucose    PAD (peripheral artery disease) (HCC)    pre CABG 04/2012 => ABIs: Right 0.92, left 0.61 // ABI 09/02/14: R 0.93; L 0.82   SPINAL STENOSIS, LUMBAR    Past Surgical History:  Procedure Laterality Date   COLONOSCOPY  02/07/2000   negative   COLONOSCOPY  08/2017   TA, Dr Myrtie Neither   CORONARY ARTERY BYPASS GRAFT N/A 04/22/2012   Procedure: CORONARY ARTERY BYPASS GRAFTING (CABG);  Surgeon: Loreli Slot, MD;  Location: [facility] OR;  Service: Open Heart Surgery;  Laterality: N/A;  CABG X 2, BIMA, POSSIBLE EVH   CORONARY STENT PLACEMENT  02/07/2008    X 2   TEE WITHOUT CARDIOVERSION N/A 04/22/2012   Procedure: TRANSESOPHAGEAL ECHOCARDIOGRAM (TEE);  Surgeon: Loreli Slot, MD;  Location: [facility] OR;  Service: Open Heart  Surgery;  Laterality: N/A;   TOTAL HIP ARTHROPLASTY Right 02/07/2007   UPPER GASTROINTESTINAL ENDOSCOPY     Patient Active Problem List   Diagnosis Date Noted   History of stroke 10/24/2022   CVA (cerebral vascular accident) (HCC) 10/12/2022   Vertebral artery dissection (HCC) 10/11/2022   Acute ischemic stroke (HCC) 10/10/2022   Hypokalemia 10/10/2022   Low magnesium levels 04/01/2022   Vitamin D deficiency 03/27/2021   Statin intolerance 03/24/2021   Vertigo 08/31/2020   OAB (overactive bladder) 05/03/2020   Statin myopathy 05/06/2019   Low back pain 04/22/2019   Pain in joint of right hip 12/12/2017   Elevated PSA 03/01/2017   CAD (coronary artery disease)    HTN (hypertension)    Hyperlipidemia    Impacted cerumen of both ears 11/24/2015   Encounter for well adult exam with abnormal findings 03/29/2015   Allergic rhinitis 03/23/2015   Solar urticaria 07/30/2014   Primary localized osteoarthrosis, lower leg 07/15/2013   Acute medial meniscal tear 07/15/2013   Other abnormal glucose 09/06/2012   Urgency of urination 09/06/2012   PAD (peripheral artery disease) (HCC) 06/28/2011   ANEMIA, MILD 02/02/2010  Urinary frequency 02/02/2010   SPINAL STENOSIS, LUMBAR 08/05/2008   LUMBAR RADICULOPATHY, RIGHT 07/29/2008   Diaphragmatic hernia 08/19/2007   Diverticulosis of colon 08/19/2007   GERD 05/06/2007   ERECTILE DYSFUNCTION 01/22/2007   BPH (benign prostatic hyperplasia) 01/22/2007   DEGENERATIVE JOINT DISEASE, RIGHT HIP 01/22/2007    PCP: [name], MD  REFERRING PROVIDER: Erick Colace, MD  REFERRING DIAG: Spondylosis without myelopathy or radiculopathy, lumbar region  Rationale for Evaluation and Treatment: Rehabilitation  THERAPY DIAG:  Other low back pain  Muscle weakness (generalized)  ONSET DATE: Chronic, 2 years   SUBJECTIVE:            SUBJECTIVE STATEMENT: Patient reports right sided lower back/hip stiffness, discomfort that keeps him  from straightening up. This has been going for about 2 years, and forced him in to retirement. His right side gets aggravated when he tries to stand up straight or when he is walking for a period of time it will start to grab him. It does seem like when he lets it hurt and he pushes through with the walking then it will do better. He reports a hip replacement on the right side that is about 85-21 years old. He had a couple injections that didn't help with his pain. Patient reports having a CVA which affected his balance, and he does still have problems with that.  PERTINENT HISTORY:  See PMH above  PAIN:  Are you having pain? Yes:  NPRS scale: 4/10  At worst 8-9/10 Pain location: Right lower back / hip Pain description: Sore, stabbing Aggravating factors: Standing up straight, walking Relieving factors: Rest, positional change  PRECAUTIONS: None  RED FLAGS: None   WEIGHT BEARING RESTRICTIONS: No  FALLS:  Has patient fallen in last 6 months? No  LIVING ENVIRONMENT: Lives with: lives with their spouse Lives in: House/apartment Stairs: No  OCCUPATION: Retired  PLOF: Independent  PATIENT GOALS: Improve ability to stand up straight and walking tolerance   OBJECTIVE:  Note: Objective measures were completed at Evaluation unless otherwise noted. PATIENT SURVEYS:  Modified Oswestry 24% (12/50)   COGNITION: Overall cognitive status: Within functional limits for tasks assessed     SENSATION: WFL  MUSCLE LENGTH: Generalized muscle tightness of bilateral hips, hamstrings, quads, and hip flexors  POSTURE:   Trunk flexion in standing  PALPATION: Tender to palpation right lumbar paraspinals, glute med and max, piriformis region  Hypomobility with lumbar CPAs   LUMBAR ROM:   AROM eval  Flexion 50%  Extension < 25%  Right lateral flexion 25%  Left lateral flexion 50%  Right rotation 50%  Left rotation 50%   (Blank rows = not tested)  LOWER EXTREMITY ROM:       Patient demonstrates limitation with bilateral hip PROM in all directions  LOWER EXTREMITY MMT:    MMT Right eval Left eval  Hip flexion 4- 4  Hip extension 3- 3  Hip abduction 3- 3  Hip adduction    Hip internal rotation    Hip external rotation    Knee flexion 5 5  Knee extension 5 5  Ankle dorsiflexion    Ankle plantarflexion    Ankle inversion    Ankle eversion     (Blank rows = not tested)  LUMBAR SPECIAL TESTS:  Not assessed  FUNCTIONAL TESTS:  5xSTS: 24 seconds  GAIT: Assistive device utilized: None Level of assistance: Complete Independence Comments: Trunk flexion, antalgic gait on right   TREATMENT  [facility] Adult PT Treatment:                                                DATE: 03/26/2023 Side clamshell with yellow x 15 Bridge x 10 Modified thomas stretch 2 x 60 sec  PATIENT EDUCATION:  Education details: Exam findings, POC, HEP Person educated: Patient Education method: Explanation, Demonstration, Tactile cues, Verbal cues, and Handouts Education comprehension: verbalized understanding, returned demonstration, verbal cues required, tactile cues required, and needs further education  HOME EXERCISE PROGRAM: Access Code: [email or web address]    ASSESSMENT: CLINICAL IMPRESSION: Patient is a 73 y.o. male who was seen today for physical therapy evaluation and treatment for chronic right lower back / hip pain. He currently demonstrates limitations in both his lumbar and hip motion, gross strength deficits of his hips, generalized muscle tightness, and right sided pain with upright posture and walking this is impacting his functional ability.    OBJECTIVE IMPAIRMENTS: Abnormal gait, decreased activity tolerance, decreased balance, decreased ROM, decreased strength, impaired flexibility, postural dysfunction, and pain.   ACTIVITY LIMITATIONS:  carrying, lifting, standing, and locomotion level  PARTICIPATION LIMITATIONS: community activity and yard work  PERSONAL FACTORS: Fitness, Past/current experiences, and Time since onset of injury/illness/exacerbation are also affecting patient's functional outcome.   REHAB POTENTIAL: Good  CLINICAL DECISION MAKING: Stable/uncomplicated  EVALUATION COMPLEXITY: Low   GOALS: Goals reviewed with patient? Yes  SHORT TERM GOALS: Target date: 04/23/2023  Patient will be I with initial HEP in order to progress with therapy. Baseline: HEP provided at eval Goal status: INITIAL  2.  Patient will report right lower back pain with standing or walking </= 6/10 in order to reduce functional limitations Baseline: 8-9/10 pain Goal status: INITIAL  LONG TERM GOALS: Target date: 05/21/2023  Patient will be I with final HEP to maintain progress from PT. Baseline: HEP provided at eval Goal status: INITIAL  2.  Patient will report modified oswestry </= 10% (5/50) in order to indicate improvement in functional status Baseline: 24% (12/50) Goal status: INITIAL  3.  Patient will demonstrate hip strength >/= 4-/5 MMT in order to improve walking tolerance Baseline: 3-/5 MMT right hip strength Goal status: INITIAL  4.  Patient will report pain with standing up straight or walking </= 3/10 in order to reduce functional limitations Baseline: 8-9/10 pain Goal status: INITIAL  5. Patient will demonstrate 5xSTS </= 12 seconds to indicate improvement in strength and mobility with a reduced fall risk  Baseline: 24 seconds  Goals status: INITIAL   PLAN: PT FREQUENCY: 1-2x/week  PT DURATION: 8 weeks  PLANNED INTERVENTIONS: 97164- PT Re-evaluation, 97110-Therapeutic exercises, 97530- Therapeutic activity, 97112- Neuromuscular re-education, 97535- Self Care, 16109- Manual therapy, [phone]- Gait training, Patient/Family education, Balance training, Dry Needling, Joint mobilization, Joint manipulation,  Spinal manipulation, Spinal mobilization, Cryotherapy, and Moist heat.  PLAN FOR NEXT SESSION: Review HEP and progress PRN, manual/mobs for lumbar region and initiate lumbar mobility/stretching, hip flexor/quad stretch, progress hip strengthening   Rosana Hoes, PT, DPT, LAT, ATC 03/26/23  3:16 PM Phone: [phone] Fax: [phone]   Referring diagnosis? M47.816  Treatment diagnosis? (if different than referring diagnosis) M54.59  What was this (referring dx) caused by? []  Surgery []  Fall [x]  Ongoing issue []  Arthritis []  Other: ____________  Laterality: [x]  Rt []  Lt []  Both  Check all possible CPT codes:  *CHOOSE 10 OR LESS*  See Planned Interventions listed in the Plan section of the Evaluation.

## 2023-04-03 ENCOUNTER — Ambulatory Visit: Payer: Medicare PPO

## 2023-04-03 DIAGNOSIS — M6281 Muscle weakness (generalized): Secondary | ICD-10-CM

## 2023-04-03 DIAGNOSIS — M5459 Other low back pain: Secondary | ICD-10-CM

## 2023-04-03 NOTE — Therapy (Signed)
 OUTPATIENT PHYSICAL THERAPY TREATMENT   Patient Name: Eric Dickerson MRN: [id number] DOB:Jan 27, 1951, 73 y.o., male Today's Date: 04/03/2023   END OF SESSION:  PT End of Session - 04/03/23 1441     Visit Number 2    Number of Visits 17    Date for PT Re-Evaluation 05/21/23    Authorization Type [employer] MCR    Progress Note Due on Visit 10    PT Start Time 1445    PT Stop Time 1525    PT Time Calculation (min) 40 min    Activity Tolerance Patient tolerated treatment well    Behavior During Therapy WFL for tasks assessed/performed              Past Medical History:  Diagnosis Date   ANEMIA, MILD    CAD (coronary artery disease)    a. CAD,  b. s/p Cypher DES to the ramus and Promus DES x 2 to the RCA 08/2007 (minimal disease in LAD and CFX at that time), c. Myoview 8/10: EF 63%, inf thinning, small prior ant infarct, no ischemia;  d. s/p CABG 3/14 (L-LAD, RIMA-OM1)   DEGENERATIVE JOINT DISEASE, RIGHT HIP    DIVERTICULOSIS, COLON    ERECTILE DYSFUNCTION    GERD    Heart murmur    HIATAL HERNIA    HTN (hypertension)    Hyperlipidemia    statin intolerant   HYPERPLASIA PROSTATE UNS W/O UR OBST & OTH LUTS    LUMBAR RADICULOPATHY, RIGHT    Other abnormal glucose    PAD (peripheral artery disease) (HCC)    pre CABG 04/2012 => ABIs: Right 0.92, left 0.61 // ABI 09/02/14: R 0.93; L 0.82   SPINAL STENOSIS, LUMBAR    Past Surgical History:  Procedure Laterality Date   COLONOSCOPY  02/07/2000   negative   COLONOSCOPY  08/2017   TA, Dr Myrtie Neither   CORONARY ARTERY BYPASS GRAFT N/A 04/22/2012   Procedure: CORONARY ARTERY BYPASS GRAFTING (CABG);  Surgeon: Loreli Slot, MD;  Location: [facility] OR;  Service: Open Heart Surgery;  Laterality: N/A;  CABG X 2, BIMA, POSSIBLE EVH   CORONARY STENT PLACEMENT  02/07/2008    X 2   TEE WITHOUT CARDIOVERSION N/A 04/22/2012   Procedure: TRANSESOPHAGEAL ECHOCARDIOGRAM (TEE);  Surgeon: Loreli Slot, MD;  Location: [facility] OR;  Service: Open  Heart Surgery;  Laterality: N/A;   TOTAL HIP ARTHROPLASTY Right 02/07/2007   UPPER GASTROINTESTINAL ENDOSCOPY     Patient Active Problem List   Diagnosis Date Noted   History of stroke 10/24/2022   CVA (cerebral vascular accident) (HCC) 10/12/2022   Vertebral artery dissection (HCC) 10/11/2022   Acute ischemic stroke (HCC) 10/10/2022   Hypokalemia 10/10/2022   Low magnesium levels 04/01/2022   Vitamin D deficiency 03/27/2021   Statin intolerance 03/24/2021   Vertigo 08/31/2020   OAB (overactive bladder) 05/03/2020   Statin myopathy 05/06/2019   Low back pain 04/22/2019   Pain in joint of right hip 12/12/2017   Elevated PSA 03/01/2017   CAD (coronary artery disease)    HTN (hypertension)    Hyperlipidemia    Impacted cerumen of both ears 11/24/2015   Encounter for well adult exam with abnormal findings 03/29/2015   Allergic rhinitis 03/23/2015   Solar urticaria 07/30/2014   Primary localized osteoarthrosis, lower leg 07/15/2013   Acute medial meniscal tear 07/15/2013   Other abnormal glucose 09/06/2012   Urgency of urination 09/06/2012   PAD (peripheral artery disease) (HCC) 06/28/2011   ANEMIA, MILD  02/02/2010   Urinary frequency 02/02/2010   SPINAL STENOSIS, LUMBAR 08/05/2008   LUMBAR RADICULOPATHY, RIGHT 07/29/2008   Diaphragmatic hernia 08/19/2007   Diverticulosis of colon 08/19/2007   GERD 05/06/2007   ERECTILE DYSFUNCTION 01/22/2007   BPH (benign prostatic hyperplasia) 01/22/2007   DEGENERATIVE JOINT DISEASE, RIGHT HIP 01/22/2007    PCP: [name], MD  REFERRING PROVIDER: Erick Colace, MD  REFERRING DIAG: Spondylosis without myelopathy or radiculopathy, lumbar region  Rationale for Evaluation and Treatment: Rehabilitation  THERAPY DIAG:  Other low back pain  Muscle weakness (generalized)  ONSET DATE: Chronic, 2 years   SUBJECTIVE:            SUBJECTIVE STATEMENT: Pt presents to PT with reports of continued R sided low back pain and R hip  stiffness. Has been compliant with HEP and notes some irritation.   EVAL: Patient reports right sided lower back/hip stiffness, discomfort that keeps him from straightening up. This has been going for about 2 years, and forced him in to retirement. His right side gets aggravated when he tries to stand up straight or when he is walking for a period of time it will start to grab him. It does seem like when he lets it hurt and he pushes through with the walking then it will do better. He reports a hip replacement on the right side that is about 78-60 years old. He had a couple injections that didn't help with his pain. Patient reports having a CVA which affected his balance, and he does still have problems with that.  PERTINENT HISTORY:  See PMH above  PAIN:  Are you having pain? Yes:  NPRS scale: 4/10  At worst 8-9/10 Pain location: Right lower back / hip Pain description: Sore, stabbing Aggravating factors: Standing up straight, walking Relieving factors: Rest, positional change  PRECAUTIONS: None  RED FLAGS: None   WEIGHT BEARING RESTRICTIONS: No  FALLS:  Has patient fallen in last 6 months? No  LIVING ENVIRONMENT: Lives with: lives with their spouse Lives in: House/apartment Stairs: No  OCCUPATION: Retired  PLOF: Independent  PATIENT GOALS: Improve ability to stand up straight and walking tolerance   OBJECTIVE:  Note: Objective measures were completed at Evaluation unless otherwise noted. PATIENT SURVEYS:  Modified Oswestry 24% (12/50)   COGNITION: Overall cognitive status: Within functional limits for tasks assessed     SENSATION: WFL  MUSCLE LENGTH: Generalized muscle tightness of bilateral hips, hamstrings, quads, and hip flexors  POSTURE:   Trunk flexion in standing  PALPATION: Tender to palpation right lumbar paraspinals, glute med and max, piriformis region  Hypomobility with lumbar CPAs   LUMBAR ROM:   AROM eval  Flexion 50%  Extension < 25%   Right lateral flexion 25%  Left lateral flexion 50%  Right rotation 50%  Left rotation 50%   (Blank rows = not tested)  LOWER EXTREMITY ROM:      Patient demonstrates limitation with bilateral hip PROM in all directions  LOWER EXTREMITY MMT:    MMT Right eval Left eval  Hip flexion 4- 4  Hip extension 3- 3  Hip abduction 3- 3  Hip adduction    Hip internal rotation    Hip external rotation    Knee flexion 5 5  Knee extension 5 5  Ankle dorsiflexion    Ankle plantarflexion    Ankle inversion    Ankle eversion     (Blank rows = not tested)  LUMBAR SPECIAL TESTS:  Not assessed  FUNCTIONAL  TESTS:  5xSTS: 24 seconds  GAIT: Assistive device utilized: None Level of assistance: Complete Independence Comments: Trunk flexion, antalgic gait on right   TODAY'S TREATMENT: [facility] Adult PT Treatment:                                                  Therapeutic Exercise: Modified thomas stretch x 60" R LTR x 10 Hooklying PPT x 10 - 5" hold Hooklying ball squeeze 2x10 - 3" hold Hooklying clamshell 2x15 RTB Bridge 2x10 Pilates SLR 3x5 each Seated physioball lumbar flexion 2x10 Therapeutic Activity: NuStep lvl 4 UE/LE x 4 min while taking subjective STS 2x10 - high table no UE  Standing mini squat 2x10 with UE support  PATIENT EDUCATION:  Education details: continue HEP Person educated: Patient Education method: Explanation, Demonstration, Tactile cues, Verbal cues, and Handouts Education comprehension: verbalized understanding, returned demonstration, verbal cues required, tactile cues required, and needs further education  HOME EXERCISE PROGRAM: Access Code: [email or web address]    ASSESSMENT: CLINICAL IMPRESSION: Pt was able to complete all prescribed exercises with no adverse effect. Therapy today focused exercises on improving core and hip strength in order to decrease pain and improve mobility. Also focused on improving functional mobility for better performance of home  ADLs and recreational activity. Pt is progressing with therapy, will continue per POC.   EVAL: Patient is a 73 y.o. male who was seen today for physical therapy evaluation and treatment for chronic right lower back / hip pain. He currently demonstrates limitations in both his lumbar and hip motion, gross strength deficits of his hips, generalized muscle tightness, and right sided pain with upright posture and walking this is impacting his functional ability.    OBJECTIVE IMPAIRMENTS: Abnormal gait, decreased activity tolerance, decreased balance, decreased ROM, decreased strength, impaired flexibility, postural dysfunction, and pain.   ACTIVITY LIMITATIONS: carrying, lifting, standing, and locomotion level  PARTICIPATION LIMITATIONS: community activity and yard work  PERSONAL FACTORS: Fitness, Past/current experiences, and Time since onset of injury/illness/exacerbation are also affecting patient's functional outcome.   REHAB POTENTIAL: Good  CLINICAL DECISION MAKING: Stable/uncomplicated  EVALUATION COMPLEXITY: Low   GOALS: Goals reviewed with patient? Yes  SHORT TERM GOALS: Target date: 04/23/2023  Patient will be I with initial HEP in order to progress with therapy. Baseline: HEP provided at eval Goal status: INITIAL  2.  Patient will report right lower back pain with standing or walking </= 6/10 in order to reduce functional limitations Baseline: 8-9/10 pain Goal status: INITIAL  LONG TERM GOALS: Target date: 05/21/2023  Patient will be I with final HEP to maintain progress from PT. Baseline: HEP provided at eval Goal status: INITIAL  2.  Patient will report modified oswestry </= 10% (5/50) in order to indicate improvement in functional status Baseline: 24% (12/50) Goal status: INITIAL  3.  Patient will demonstrate hip strength >/= 4-/5 MMT in order to improve walking tolerance Baseline: 3-/5 MMT right hip strength Goal status: INITIAL  4.  Patient will report pain  with standing up straight or walking </= 3/10 in order to reduce functional limitations Baseline: 8-9/10 pain Goal status: INITIAL  5. Patient will demonstrate 5xSTS </= 12 seconds to indicate improvement in strength and mobility with a reduced fall risk  Baseline: 24 seconds  Goals status: INITIAL   PLAN: PT FREQUENCY: 1-2x/week  PT DURATION: 8 weeks  PLANNED  INTERVENTIONS: 97164- PT Re-evaluation, 97110-Therapeutic exercises, 97530- Therapeutic activity, 97112- Neuromuscular re-education, 97535- Self Care, 62130- Manual therapy, [phone]- Gait training, Patient/Family education, Balance training, Dry Needling, Joint mobilization, Joint manipulation, Spinal manipulation, Spinal mobilization, Cryotherapy, and Moist heat.  PLAN FOR NEXT SESSION: Review HEP and progress PRN, manual/mobs for lumbar region and initiate lumbar mobility/stretching, hip flexor/quad stretch, progress hip strengthening   Rosana Hoes, PT, DPT, LAT, ATC 04/03/23  3:29 PM Phone: [phone] Fax: [phone]   Referring diagnosis? M47.816  Treatment diagnosis? (if different than referring diagnosis) M54.59  What was this (referring dx) caused by? []  Surgery []  Fall [x]  Ongoing issue []  Arthritis []  Other: ____________  Laterality: [x]  Rt []  Lt []  Both  Check all possible CPT codes:  *CHOOSE 10 OR LESS*    See Planned Interventions listed in the Plan section of the Evaluation.

## 2023-04-05 ENCOUNTER — Encounter: Payer: Self-pay | Admitting: Physical Therapy

## 2023-04-05 ENCOUNTER — Ambulatory Visit: Payer: Medicare PPO | Admitting: Physical Therapy

## 2023-04-05 DIAGNOSIS — M6281 Muscle weakness (generalized): Secondary | ICD-10-CM

## 2023-04-05 DIAGNOSIS — R262 Difficulty in walking, not elsewhere classified: Secondary | ICD-10-CM

## 2023-04-05 DIAGNOSIS — M5459 Other low back pain: Secondary | ICD-10-CM

## 2023-04-05 NOTE — Therapy (Signed)
 OUTPATIENT PHYSICAL THERAPY TREATMENT   Patient Name: Eric Dickerson MRN: [id number] DOB:[birth year], 73 y.o., male Today's Date: 04/05/2023   END OF SESSION:  PT End of Session - 04/05/23 1445     Visit Number 3    Number of Visits 17    Date for PT Re-Evaluation 05/21/23    Authorization Type [employer] MCR    Authorization Time Period 04/02/23-05/19/23    Authorization - Visit Number 2    Authorization - Number of Visits 12    PT Start Time 0242    PT Stop Time 0320    PT Time Calculation (min) 38 min              Past Medical History:  Diagnosis Date   ANEMIA, MILD    CAD (coronary artery disease)    a. CAD,  b. s/p Cypher DES to the ramus and Promus DES x 2 to the RCA 08/2007 (minimal disease in LAD and CFX at that time), c. Myoview 8/10: EF 63%, inf thinning, small prior ant infarct, no ischemia;  d. s/p CABG 3/14 (L-LAD, RIMA-OM1)   DEGENERATIVE JOINT DISEASE, RIGHT HIP    DIVERTICULOSIS, COLON    ERECTILE DYSFUNCTION    GERD    Heart murmur    HIATAL HERNIA    HTN (hypertension)    Hyperlipidemia    statin intolerant   HYPERPLASIA PROSTATE UNS W/O UR OBST & OTH LUTS    LUMBAR RADICULOPATHY, RIGHT    Other abnormal glucose    PAD (peripheral artery disease) (HCC)    pre CABG 04/2012 => ABIs: Right 0.92, left 0.61 // ABI 09/02/14: R 0.93; L 0.82   SPINAL STENOSIS, LUMBAR    Past Surgical History:  Procedure Laterality Date   COLONOSCOPY  02/07/2000   negative   COLONOSCOPY  08/2017   TA, Dr Myrtie Neither   CORONARY ARTERY BYPASS GRAFT N/A 04/22/2012   Procedure: CORONARY ARTERY BYPASS GRAFTING (CABG);  Surgeon: Loreli Slot, MD;  Location: [facility] OR;  Service: Open Heart Surgery;  Laterality: N/A;  CABG X 2, BIMA, POSSIBLE EVH   CORONARY STENT PLACEMENT  02/07/2008    X 2   TEE WITHOUT CARDIOVERSION N/A 04/22/2012   Procedure: TRANSESOPHAGEAL ECHOCARDIOGRAM (TEE);  Surgeon: Loreli Slot, MD;  Location[facility] OR;  Service: Open Heart Surgery;  Laterality: N/A;    TOTAL HIP ARTHROPLASTY Right 02/07/2007   UPPER GASTROINTESTINAL ENDOSCOPY     Patient Active Problem List   Diagnosis Date Noted   History of stroke 10/24/2022   CVA (cerebral vascular accident) (HCC) 10/12/2022   Vertebral artery dissection (HCC) 10/11/2022   Acute ischemic stroke (HCC) 10/10/2022   Hypokalemia 10/10/2022   Low magnesium levels 04/01/2022   Vitamin D deficiency 03/27/2021   Statin intolerance 03/24/2021   Vertigo 08/31/2020   OAB (overactive bladder) 05/03/2020   Statin myopathy 05/06/2019   Low back pain 04/22/2019   Pain in joint of right hip 12/12/2017   Elevated PSA 03/01/2017   CAD (coronary artery disease)    HTN (hypertension)    Hyperlipidemia    Impacted cerumen of both ears 11/24/2015   Encounter for well adult exam with abnormal findings 03/29/2015   Allergic rhinitis 03/23/2015   Solar urticaria 07/30/2014   Primary localized osteoarthrosis, lower leg 07/15/2013   Acute medial meniscal tear 07/15/2013   Other abnormal glucose 09/06/2012   Urgency of urination 09/06/2012   PAD (peripheral artery disease) (HCC) 06/28/2011   ANEMIA, MILD 02/02/2010   Urinary  frequency 02/02/2010   SPINAL STENOSIS, LUMBAR 08/05/2008   LUMBAR RADICULOPATHY, RIGHT 07/29/2008   Diaphragmatic hernia 08/19/2007   Diverticulosis of colon 08/19/2007   GERD 05/06/2007   ERECTILE DYSFUNCTION 01/22/2007   BPH (benign prostatic hyperplasia) 01/22/2007   DEGENERATIVE JOINT DISEASE, RIGHT HIP 01/22/2007    PCP: [name], MD  REFERRING PROVIDER: Erick Colace, MD  REFERRING DIAG: Spondylosis without myelopathy or radiculopathy, lumbar region  Rationale for Evaluation and Treatment: Rehabilitation  THERAPY DIAG:  Other low back pain  Muscle weakness (generalized)  Difficulty in walking, not elsewhere classified  ONSET DATE: Chronic, 2 years   SUBJECTIVE:            SUBJECTIVE STATEMENT: Pt presents to PT with reports of continued R sided low  back pain and R hip stiffness. Has been compliant with HEP and notes some irritation.   EVAL: Patient reports right sided lower back/hip stiffness, discomfort that keeps him from straightening up. This has been going for about 2 years, and forced him in to retirement. His right side gets aggravated when he tries to stand up straight or when he is walking for a period of time it will start to grab him. It does seem like when he lets it hurt and he pushes through with the walking then it will do better. He reports a hip replacement on the right side that is about 33-60 years old. He had a couple injections that didn't help with his pain. Patient reports having a CVA which affected his balance, and he does still have problems with that.  PERTINENT HISTORY:  See PMH above  PAIN:  Are you having pain? Yes:  NPRS scale: 4/10  At worst 8-9/10 Pain location: Right lower back / hip Pain description: Sore, stabbing Aggravating factors: Standing up straight, walking Relieving factors: Rest, positional change  PRECAUTIONS: None  RED FLAGS: None   WEIGHT BEARING RESTRICTIONS: No  FALLS:  Has patient fallen in last 6 months? No  LIVING ENVIRONMENT: Lives with: lives with their spouse Lives in: House/apartment Stairs: No  OCCUPATION: Retired  PLOF: Independent  PATIENT GOALS: Improve ability to stand up straight and walking tolerance   OBJECTIVE:  Note: Objective measures were completed at Evaluation unless otherwise noted. PATIENT SURVEYS:  Modified Oswestry 24% (12/50)   COGNITION: Overall cognitive status: Within functional limits for tasks assessed     SENSATION: WFL  MUSCLE LENGTH: Generalized muscle tightness of bilateral hips, hamstrings, quads, and hip flexors  POSTURE:   Trunk flexion in standing  PALPATION: Tender to palpation right lumbar paraspinals, glute med and max, piriformis region  Hypomobility with lumbar CPAs   LUMBAR ROM:   AROM eval  Flexion 50%   Extension < 25%  Right lateral flexion 25%  Left lateral flexion 50%  Right rotation 50%  Left rotation 50%   (Blank rows = not tested)  LOWER EXTREMITY ROM:      Patient demonstrates limitation with bilateral hip PROM in all directions  LOWER EXTREMITY MMT:    MMT Right eval Left eval  Hip flexion 4- 4  Hip extension 3- 3  Hip abduction 3- 3  Hip adduction    Hip internal rotation    Hip external rotation    Knee flexion 5 5  Knee extension 5 5  Ankle dorsiflexion    Ankle plantarflexion    Ankle inversion    Ankle eversion     (Blank rows = not tested)  LUMBAR SPECIAL TESTS:  Not  assessed  FUNCTIONAL TESTS:  5xSTS: 24 seconds  GAIT: Assistive device utilized: None Level of assistance: Complete Independence Comments: Trunk flexion, antalgic gait on right   TODAY'S TREATMENT: [facility] Adult PT Treatment:                                                DATE: 04/05/23 Therapeutic Exercise: SKTC  LTR Supine clam red band 15 x 2  PPT SLR 5 x 2 each  HL ball squeeze 10 x 2  Hip flexor stretch   Therapeutic Activity: Alternating hip flexion Standing hip abduction 10 x 2 each  Front lean on counter with hip ext 2 x 10 each  Mini squat at counter x 10   [facility] Adult PT Treatment:                                                  Therapeutic Exercise: Modified thomas stretch x 60" R LTR x 10 Hooklying PPT x 10 - 5" hold Hooklying ball squeeze 2x10 - 3" hold Hooklying clamshell 2x15 RTB Bridge 2x10 Pilates SLR 3x5 each Seated physioball lumbar flexion 2x10 Therapeutic Activity: NuStep lvl 4 UE/LE x 4 min while taking subjective STS 2x10 - high table no UE  Standing mini squat 2x10 with UE support  PATIENT EDUCATION:  Education details: continue HEP Person educated: Patient Education method: Explanation, Demonstration, Tactile cues, Verbal cues, and Handouts Education comprehension: verbalized understanding, returned demonstration, verbal cues required,  tactile cues required, and needs further education  HOME EXERCISE PROGRAM: Access Code: [email or web address]    ASSESSMENT: CLINICAL IMPRESSION: Pt was able to complete all prescribed exercises with no adverse effect. Therapy today focused exercises on improving core and hip strength in order to decrease pain and improve mobility. Also focused on improving functional mobility for better performance of home ADLs and recreational activity. Pt reports he felt good after last session however his stabbing pain returns in the mornings. Pt is progressing with therapy, will continue per POC.   EVAL: Patient is a 73 y.o. male who was seen today for physical therapy evaluation and treatment for chronic right lower back / hip pain. He currently demonstrates limitations in both his lumbar and hip motion, gross strength deficits of his hips, generalized muscle tightness, and right sided pain with upright posture and walking this is impacting his functional ability.    OBJECTIVE IMPAIRMENTS: Abnormal gait, decreased activity tolerance, decreased balance, decreased ROM, decreased strength, impaired flexibility, postural dysfunction, and pain.   ACTIVITY LIMITATIONS: carrying, lifting, standing, and locomotion level  PARTICIPATION LIMITATIONS: community activity and yard work  PERSONAL FACTORS: Fitness, Past/current experiences, and Time since onset of injury/illness/exacerbation are also affecting patient's functional outcome.   REHAB POTENTIAL: Good  CLINICAL DECISION MAKING: Stable/uncomplicated  EVALUATION COMPLEXITY: Low   GOALS: Goals reviewed with patient? Yes  SHORT TERM GOALS: Target date: 04/23/2023  Patient will be I with initial HEP in order to progress with therapy. Baseline: HEP provided at eval Goal status: INITIAL  2.  Patient will report right lower back pain with standing or walking </= 6/10 in order to reduce functional limitations Baseline: 8-9/10 pain Goal status: INITIAL  LONG TERM  GOALS: Target date: 05/21/2023  Patient will be  I with final HEP to maintain progress from PT. Baseline: HEP provided at eval Goal status: INITIAL  2.  Patient will report modified oswestry </= 10% (5/50) in order to indicate improvement in functional status Baseline: 24% (12/50) Goal status: INITIAL  3.  Patient will demonstrate hip strength >/= 4-/5 MMT in order to improve walking tolerance Baseline: 3-/5 MMT right hip strength Goal status: INITIAL  4.  Patient will report pain with standing up straight or walking </= 3/10 in order to reduce functional limitations Baseline: 8-9/10 pain Goal status: INITIAL  5. Patient will demonstrate 5xSTS </= 12 seconds to indicate improvement in strength and mobility with a reduced fall risk  Baseline: 24 seconds  Goals status: INITIAL   PLAN: PT FREQUENCY: 1-2x/week  PT DURATION: 8 weeks  PLANNED INTERVENTIONS: 97164- PT Re-evaluation, 97110-Therapeutic exercises, 97530- Therapeutic activity, 97112- Neuromuscular re-education, 97535- Self Care, 16109- Manual therapy, [phone]- Gait training, Patient/Family education, Balance training, Dry Needling, Joint mobilization, Joint manipulation, Spinal manipulation, Spinal mobilization, Cryotherapy, and Moist heat.  PLAN FOR NEXT SESSION: Review HEP and progress PRN, manual/mobs for lumbar region and initiate lumbar mobility/stretching, hip flexor/quad stretch, progress hip strengthening   Jannette Spanner, PTA 04/05/23 3:17 PM Phone: [phone] Fax: [phone]    Referring diagnosis? M47.816  Treatment diagnosis? (if different than referring diagnosis) M54.59  What was this (referring dx) caused by? []  Surgery []  Fall [x]  Ongoing issue []  Arthritis []  Other: ____________  Laterality: [x]  Rt []  Lt []  Both  Check all possible CPT codes:  *CHOOSE 10 OR LESS*    See Planned Interventions listed in the Plan section of the Evaluation.

## 2023-04-09 ENCOUNTER — Other Ambulatory Visit: Payer: Self-pay

## 2023-04-09 ENCOUNTER — Encounter: Payer: Self-pay | Admitting: Physical Therapy

## 2023-04-09 ENCOUNTER — Ambulatory Visit: Payer: Medicare PPO | Attending: Physical Medicine & Rehabilitation | Admitting: Physical Therapy

## 2023-04-09 DIAGNOSIS — M5459 Other low back pain: Secondary | ICD-10-CM | POA: Insufficient documentation

## 2023-04-09 DIAGNOSIS — M6281 Muscle weakness (generalized): Secondary | ICD-10-CM | POA: Insufficient documentation

## 2023-04-09 NOTE — Therapy (Signed)
 OUTPATIENT PHYSICAL THERAPY TREATMENT   Patient Name: Eric Dickerson MRN: [id number] DOB:Nov 02, 1950, 73 y.o., male Today's Date: 04/09/2023   END OF SESSION:  PT End of Session - 04/09/23 1537     Visit Number 4    Number of Visits 17    Date for PT Re-Evaluation 05/21/23    Authorization Type [employer] MCR    Authorization Time Period 04/02/23 - 05/19/23    Authorization - Visit Number 3    Authorization - Number of Visits 12    Progress Note Due on Visit 10    PT Start Time 1445    PT Stop Time 1525    PT Time Calculation (min) 40 min    Activity Tolerance Patient tolerated treatment well    Behavior During Therapy WFL for tasks assessed/performed               Past Medical History:  Diagnosis Date   ANEMIA, MILD    CAD (coronary artery disease)    a. CAD,  b. s/p Cypher DES to the ramus and Promus DES x 2 to the RCA 08/2007 (minimal disease in LAD and CFX at that time), c. Myoview 8/10: EF 63%, inf thinning, small prior ant infarct, no ischemia;  d. s/p CABG 3/14 (L-LAD, RIMA-OM1)   DEGENERATIVE JOINT DISEASE, RIGHT HIP    DIVERTICULOSIS, COLON    ERECTILE DYSFUNCTION    GERD    Heart murmur    HIATAL HERNIA    HTN (hypertension)    Hyperlipidemia    statin intolerant   HYPERPLASIA PROSTATE UNS W/O UR OBST & OTH LUTS    LUMBAR RADICULOPATHY, RIGHT    Other abnormal glucose    PAD (peripheral artery disease) (HCC)    pre CABG 04/2012 => ABIs: Right 0.92, left 0.61 // ABI 09/02/14: R 0.93; L 0.82   SPINAL STENOSIS, LUMBAR    Past Surgical History:  Procedure Laterality Date   COLONOSCOPY  02/07/2000   negative   COLONOSCOPY  08/2017   TA, Dr Myrtie Neither   CORONARY ARTERY BYPASS GRAFT N/A 04/22/2012   Procedure: CORONARY ARTERY BYPASS GRAFTING (CABG);  Surgeon: Loreli Slot, MD;  Location: [facility] OR;  Service: Open Heart Surgery;  Laterality: N/A;  CABG X 2, BIMA, POSSIBLE EVH   CORONARY STENT PLACEMENT  02/07/2008    X 2   TEE WITHOUT CARDIOVERSION N/A 04/22/2012    Procedure: TRANSESOPHAGEAL ECHOCARDIOGRAM (TEE);  Surgeon: Loreli Slot, MD;  Location: [facility] OR;  Service: Open Heart Surgery;  Laterality: N/A;   TOTAL HIP ARTHROPLASTY Right 02/07/2007   UPPER GASTROINTESTINAL ENDOSCOPY     Patient Active Problem List   Diagnosis Date Noted   History of stroke 10/24/2022   CVA (cerebral vascular accident) (HCC) 10/12/2022   Vertebral artery dissection (HCC) 10/11/2022   Acute ischemic stroke (HCC) 10/10/2022   Hypokalemia 10/10/2022   Low magnesium levels 04/01/2022   Vitamin D deficiency 03/27/2021   Statin intolerance 03/24/2021   Vertigo 08/31/2020   OAB (overactive bladder) 05/03/2020   Statin myopathy 05/06/2019   Low back pain 04/22/2019   Pain in joint of right hip 12/12/2017   Elevated PSA 03/01/2017   CAD (coronary artery disease)    HTN (hypertension)    Hyperlipidemia    Impacted cerumen of both ears 11/24/2015   Encounter for well adult exam with abnormal findings 03/29/2015   Allergic rhinitis 03/23/2015   Solar urticaria 07/30/2014   Primary localized osteoarthrosis, lower leg 07/15/2013   Acute medial  meniscal tear 07/15/2013   Other abnormal glucose 09/06/2012   Urgency of urination 09/06/2012   PAD (peripheral artery disease) (HCC) 06/28/2011   ANEMIA, MILD 02/02/2010   Urinary frequency 02/02/2010   SPINAL STENOSIS, LUMBAR 08/05/2008   LUMBAR RADICULOPATHY, RIGHT 07/29/2008   Diaphragmatic hernia 08/19/2007   Diverticulosis of colon 08/19/2007   GERD 05/06/2007   ERECTILE DYSFUNCTION 01/22/2007   BPH (benign prostatic hyperplasia) 01/22/2007   DEGENERATIVE JOINT DISEASE, RIGHT HIP 01/22/2007    PCP: [name], MD  REFERRING PROVIDER: Erick Colace, MD  REFERRING DIAG: Spondylosis without myelopathy or radiculopathy, lumbar region  Rationale for Evaluation and Treatment: Rehabilitation  THERAPY DIAG:  Other low back pain  Muscle weakness (generalized)  ONSET DATE: Chronic, 2  years   SUBJECTIVE:            SUBJECTIVE STATEMENT: Patient reports his exercises do make him sore but overall he feels like he can do more. He did walk around his block the other day and this is more than he has done in a while.    EVAL: Patient reports right sided lower back/hip stiffness, discomfort that keeps him from straightening up. This has been going for about 2 years, and forced him in to retirement. His right side gets aggravated when he tries to stand up straight or when he is walking for a period of time it will start to grab him. It does seem like when he lets it hurt and he pushes through with the walking then it will do better. He reports a hip replacement on the right side that is about 68-50 years old. He had a couple injections that didn't help with his pain. Patient reports having a CVA which affected his balance, and he does still have problems with that.  PERTINENT HISTORY:  See PMH above  PAIN:  Are you having pain? Yes:  NPRS scale: 4/10  At worst 8-9/10 Pain location: Right lower back / hip Pain description: Sore, stabbing Aggravating factors: Standing up straight, walking Relieving factors: Rest, positional change  PRECAUTIONS: None  RED FLAGS: None   WEIGHT BEARING RESTRICTIONS: No  FALLS:  Has patient fallen in last 6 months? No  LIVING ENVIRONMENT: Lives with: lives with their spouse Lives in: House/apartment Stairs: No  OCCUPATION: Retired  PLOF: Independent  PATIENT GOALS: Improve ability to stand up straight and walking tolerance   OBJECTIVE:  Note: Objective measures were completed at Evaluation unless otherwise noted. PATIENT SURVEYS:  Modified Oswestry 24% (12/50)   SENSATION: WFL  MUSCLE LENGTH: Generalized muscle tightness of bilateral hips, hamstrings, quads, and hip flexors  POSTURE:   Trunk flexion in standing  PALPATION: Tender to palpation right lumbar paraspinals, glute med and max, piriformis  region  Hypomobility with lumbar CPAs   LUMBAR ROM:   AROM eval  Flexion 50%  Extension < 25%  Right lateral flexion 25%  Left lateral flexion 50%  Right rotation 50%  Left rotation 50%   (Blank rows = not tested)  LOWER EXTREMITY ROM:      Patient demonstrates limitation with bilateral hip PROM in all directions  LOWER EXTREMITY MMT:    MMT Right eval Left eval Rt / Lt 04/09/2023  Hip flexion 4- 4   Hip extension 3- 3   Hip abduction 3- 3 3- / 3  Hip adduction     Hip internal rotation     Hip external rotation     Knee flexion 5 5   Knee extension  5 5   Ankle dorsiflexion     Ankle plantarflexion     Ankle inversion     Ankle eversion      (Blank rows = not tested)  LUMBAR SPECIAL TESTS:  Not assessed  FUNCTIONAL TESTS:  5xSTS: 24 seconds  GAIT: Assistive device utilized: None Level of assistance: Complete Independence Comments: Trunk flexion, antalgic gait on right   TODAY'S TREATMENT: [facility] Adult PT Treatment:                                                DATE: 04/09/2023 NuStep L6 x 5 min with UE/LE to improve endurance Modified thomas stretch 3 x 30 sec each Bridge with red at knees 3 x 10 Side clamshell with red 3 x 10 each Sit to stand from elevated table holding 10# at chest 3 x 10 Standing hip abduction with red at knees 2 x 10 each Standing hip extension with stance leg on 2" box 2 x 10 each  PATIENT EDUCATION:  Education details: HEP Person educated: Patient Education method: Programmer, multimedia, Demonstration, Actor cues, Verbal cues Education comprehension: verbalized understanding, returned demonstration, verbal cues required, tactile cues required, and needs further education  HOME EXERCISE PROGRAM: Access Code: [email or web address]    ASSESSMENT: CLINICAL IMPRESSION: Patient tolerated therapy well with no adverse effects. Therapy focused on improving flexibility and strength. He does note majority of his symptoms are at the right hip region and he  does exhibit continued strength deficit of the right hip. He was able to complete all prescribed exercises but demonstrates compensation and greater difficulty on right side. He did report fatigue post therapy this visit. No changes made to HEP. Patient would benefit from continued skilled PT to progress his mobility and strength in order to reduce pain and maximize functional ability.   EVAL: Patient is a 73 y.o. male who was seen today for physical therapy evaluation and treatment for chronic right lower back / hip pain. He currently demonstrates limitations in both his lumbar and hip motion, gross strength deficits of his hips, generalized muscle tightness, and right sided pain with upright posture and walking this is impacting his functional ability.   OBJECTIVE IMPAIRMENTS: Abnormal gait, decreased activity tolerance, decreased balance, decreased ROM, decreased strength, impaired flexibility, postural dysfunction, and pain.   ACTIVITY LIMITATIONS: carrying, lifting, standing, and locomotion level  PARTICIPATION LIMITATIONS: community activity and yard work  PERSONAL FACTORS: Fitness, Past/current experiences, and Time since onset of injury/illness/exacerbation are also affecting patient's functional outcome.    GOALS: Goals reviewed with patient? Yes  SHORT TERM GOALS: Target date: 04/23/2023  Patient will be I with initial HEP in order to progress with therapy. Baseline: HEP provided at eval Goal status: INITIAL  2.  Patient will report right lower back pain with standing or walking </= 6/10 in order to reduce functional limitations Baseline: 8-9/10 pain Goal status: INITIAL  LONG TERM GOALS: Target date: 05/21/2023  Patient will be I with final HEP to maintain progress from PT. Baseline: HEP provided at eval Goal status: INITIAL  2.  Patient will report modified oswestry </= 10% (5/50) in order to indicate improvement in functional status Baseline: 24% (12/50) Goal status:  INITIAL  3.  Patient will demonstrate hip strength >/= 4-/5 MMT in order to improve walking tolerance Baseline: 3-/5 MMT right hip strength Goal status: INITIAL  4.  Patient will report pain with standing up straight or walking </= 3/10 in order to reduce functional limitations Baseline: 8-9/10 pain Goal status: INITIAL  5. Patient will demonstrate 5xSTS </= 12 seconds to indicate improvement in strength and mobility with a reduced fall risk  Baseline: 24 seconds  Goals status: INITIAL   PLAN: PT FREQUENCY: 1-2x/week  PT DURATION: 8 weeks  PLANNED INTERVENTIONS: 97164- PT Re-evaluation, 97110-Therapeutic exercises, 97530- Therapeutic activity, 97112- Neuromuscular re-education, 97535- Self Care, 60454- Manual therapy, [phone]- Gait training, Patient/Family education, Balance training, Dry Needling, Joint mobilization, Joint manipulation, Spinal manipulation, Spinal mobilization, Cryotherapy, and Moist heat.  PLAN FOR NEXT SESSION: Review HEP and progress PRN, manual/mobs for lumbar region and initiate lumbar mobility/stretching, hip flexor/quad stretch, progress hip strengthening   Rosana Hoes, PT, DPT, LAT, ATC 04/09/23  3:56 PM Phone: [phone] Fax: [phone]

## 2023-04-10 ENCOUNTER — Encounter: Payer: Medicare PPO | Attending: Physical Medicine & Rehabilitation | Admitting: Physical Medicine & Rehabilitation

## 2023-04-10 ENCOUNTER — Encounter: Payer: Self-pay | Admitting: Physical Medicine & Rehabilitation

## 2023-04-10 VITALS — BP 152/98 | HR 83 | Ht 74.0 in | Wt 180.0 lb

## 2023-04-10 DIAGNOSIS — M47816 Spondylosis without myelopathy or radiculopathy, lumbar region: Secondary | ICD-10-CM | POA: Insufficient documentation

## 2023-04-10 DIAGNOSIS — M1712 Unilateral primary osteoarthritis, left knee: Secondary | ICD-10-CM | POA: Diagnosis not present

## 2023-04-10 NOTE — Progress Notes (Signed)
 Subjective:    Patient ID: Eric Dickerson, male    DOB: January 23, 1951, 73 y.o.   MRN: [id number]  HPI 73 year old male with history of coronary artery biased has status post drug-eluting stent placement x 2 and later CABG x 2 in 2014. He has peripheral artery disease affecting left lower extremity.  In addition he has lumbar spinal stenosis The patient has started outpatient PT, working on core strengthening as well as flexibility, hip extensor weakness noted The patient has had right L3-L4-L5 medial branch blocks which gave him a temporary relief of pain.  Preinjection pain score 7/10 postinjection pain score 0/10.  Because it was a temporary relief the patient has been reluctant to repeat Since last visit he has started physical therapy his main complaints have been right-sided low back pain Prior MRI of the lumbar spine dated 05/28/2019 did show mild neuroforaminal stenosis right L2-3 as well as moderate at L3-4 this can certainly mimic some of his pain. At this point the patient feels like his left knee pain is flaring up again.  He has not had any falls or trauma to the area. Pain Inventory Average Pain 8 Pain Right Now 6 My pain is  stiffness  In the last 24 hours, has pain interfered with the following? General activity 6 Relation with others 0 Enjoyment of life 6 What TIME of day is your pain at its worst? varies Sleep (in general) Good  Pain is worse with: unsure Pain improves with:  . Relief from Meds:  .  Family History  Problem Relation Age of Onset   Hyperlipidemia Mother    Hypertension Father    Heart attack Father 72       smoker   Stroke Brother        2 brothers ; 110 & 36   Hyperlipidemia Brother    Pulmonary embolism Brother    Heart attack Brother 44   Diabetes Maternal Grandmother    Cancer Neg Hx    COPD Neg Hx    Colon cancer Neg Hx    Esophageal cancer Neg Hx    Stomach cancer Neg Hx    Rectal cancer Neg Hx    Colon polyps Neg Hx    Social History    Socioeconomic History   Marital status: Married    Spouse name: Karena Addison   Number of children: 4   Years of education: 12   Highest education level: Not on file  Occupational History   Occupation: Facilities Management  Tobacco Use   Smoking status: Former    Current packs/day: 0.00    Average packs/day: 1.5 packs/day for 12.0 years (18.0 ttl pk-yrs)    Types: Cigarettes    Start date: 02/06/1973    Quit date: 02/06/1985    Years since quitting: 38.1   Smokeless tobacco: Never   Tobacco comments:    smoked 1967-1987, up to 1.5 ppd  Vaping Use   Vaping status: Never Used  Substance and Sexual Activity   Alcohol use: Never   Drug use: Never   Sexual activity: Not on file  Other Topics Concern   Not on file  Social History Narrative   Denies abuse and feels safe at home. Lives with wife.   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    Social Drivers of [employer]: Low Risk  (01/01/2023)   Overall Financial Resource Strain (CARDIA)    Difficulty of Paying Living Expenses: Not very hard  Food Insecurity: No Food  Insecurity (01/01/2023)   Hunger Vital Sign    Worried About Running Out of Food in the Last Year: Never true    Ran Out of Food in the Last Year: Never true  Transportation Needs: No Transportation Needs (01/01/2023)   PRAPARE - Administrator, Civil Service (Medical): No    Lack of Transportation (Non-Medical): No  Physical Activity: Inactive (01/01/2023)   Exercise Vital Sign    Days of Exercise per Week: 0 days    Minutes of Exercise per Session: 0 min  Stress: No Stress Concern Present (01/01/2023)   [employer] of Occupational Health - Occupational Stress Questionnaire    Feeling of Stress : Not at all  Social Connections: Moderately Isolated (01/01/2023)   Social Connection and Isolation Panel [NHANES]    Frequency of Communication with Friends and Family: Twice a week    Frequency of Social Gatherings with Friends and Family: Twice  a week    Attends Religious Services: Never    Database administrator or Organizations: No    Attends Banker Meetings: Never    Marital Status: Married   Past Surgical History:  Procedure Laterality Date   COLONOSCOPY  02/07/2000   negative   COLONOSCOPY  08/2017   TA, Dr Myrtie Neither   CORONARY ARTERY BYPASS GRAFT N/A 04/22/2012   Procedure: CORONARY ARTERY BYPASS GRAFTING (CABG);  Surgeon: Loreli Slot, MD;  Location: [facility] OR;  Service: Open Heart Surgery;  Laterality: N/A;  CABG X 2, BIMA, POSSIBLE EVH   CORONARY STENT PLACEMENT  02/07/2008    X 2   TEE WITHOUT CARDIOVERSION N/A 04/22/2012   Procedure: TRANSESOPHAGEAL ECHOCARDIOGRAM (TEE);  Surgeon: Loreli Slot, MD;  Location[facility] OR;  Service: Open Heart Surgery;  Laterality: N/A;   TOTAL HIP ARTHROPLASTY Right 02/07/2007   UPPER GASTROINTESTINAL ENDOSCOPY     Past Surgical History:  Procedure Laterality Date   COLONOSCOPY  02/07/2000   negative   COLONOSCOPY  08/2017   TA, Dr Myrtie Neither   CORONARY ARTERY BYPASS GRAFT N/A 04/22/2012   Procedure: CORONARY ARTERY BYPASS GRAFTING (CABG);  Surgeon: Loreli Slot, MD;  Location: [facility] OR;  Service: Open Heart Surgery;  Laterality: N/A;  CABG X 2, BIMA, POSSIBLE EVH   CORONARY STENT PLACEMENT  02/07/2008    X 2   TEE WITHOUT CARDIOVERSION N/A 04/22/2012   Procedure: TRANSESOPHAGEAL ECHOCARDIOGRAM (TEE);  Surgeon: Loreli Slot, MD;  Location: [facility] OR;  Service: Open Heart Surgery;  Laterality: N/A;   TOTAL HIP ARTHROPLASTY Right 02/07/2007   UPPER GASTROINTESTINAL ENDOSCOPY     Past Medical History:  Diagnosis Date   ANEMIA, MILD    CAD (coronary artery disease)    a. CAD,  b. s/p Cypher DES to the ramus and Promus DES x 2 to the RCA 08/2007 (minimal disease in LAD and CFX at that time), c. Myoview 8/10: EF 63%, inf thinning, small prior ant infarct, no ischemia;  d. s/p CABG 3/14 (L-LAD, RIMA-OM1)   DEGENERATIVE JOINT DISEASE, RIGHT HIP     DIVERTICULOSIS, COLON    ERECTILE DYSFUNCTION    GERD    Heart murmur    HIATAL HERNIA    HTN (hypertension)    Hyperlipidemia    statin intolerant   HYPERPLASIA PROSTATE UNS W/O UR OBST & OTH LUTS    LUMBAR RADICULOPATHY, RIGHT    Other abnormal glucose    PAD (peripheral artery disease) (HCC)    pre CABG 04/2012 => ABIs:  Right 0.92, left 0.61 // ABI 09/02/14: R 0.93; L 0.82   SPINAL STENOSIS, LUMBAR    BP (!) 152/98   Pulse 83   Ht 6\' 2"  (1.88 m)   Wt 180 lb (81.6 kg)   SpO2 98%   BMI 23.11 kg/m   Opioid Risk Score:   Fall Risk Score:  `1  Depression screen PHQ 2/9     01/01/2023    2:45 PM 11/28/2022   12:54 PM 08/07/2022    3:53 PM 03/28/2022    2:07 PM 03/24/2021    2:00 PM 03/24/2021    1:41 PM 08/31/2020    8:49 AM  Depression screen PHQ 2/9  Decreased Interest 0 0 0 0 0 1 0  Down, Depressed, Hopeless 0 0 0 0 0 0 0  PHQ - 2 Score 0 0 0 0 0 1 0  Altered sleeping 0 0  0     Tired, decreased energy 0 0  0     Change in appetite 0 0  0     Feeling bad or failure about yourself  0 0  0     Trouble concentrating 0 0  0     Moving slowly or fidgety/restless 0 0  0     Suicidal thoughts 0 0  0     PHQ-9 Score 0 0  0     Difficult doing work/chores Not difficult at all   Not difficult at all         Review of Systems  Musculoskeletal:        Right hip stiffness  All other systems reviewed and are negative.     Objective:   Physical Exam General No acute distress Mood and affect appropriate Motor strength is 5/5 in right hip flexor knee extensor ankle dorsiflexor 4/5 in the left hip flexor 5/5 knee extensor 5/5 dorsiflexion Musculoskeletal the patient has normal right hip and knee range of motion On the left side he has full knee extension but flexion is only to 100 degrees.  Hip internal rotation is 0 external rotation is minimal Ambulates with a forward flexed posture no evidence of toe drag or knee instability       Assessment & Plan:   1.  Lumbar  spondylosis and lumbar stenosis he actually got a good relief during the anesthetic phase of his injection and would likely benefit from repeat right-sided L3-L4-L5 medial branch blocks. 2.  Left knee pain osteoarthritis with knee contracture, has had good results with gel injection on the right knee we will schedule for left knee 3.  Left hip contracture osteoarthritis at this point the patient is reluctant to undergo orthopedic surgery patient.  Consider intra-articular hip injection if the groin pain develops

## 2023-04-10 NOTE — Patient Instructions (Signed)
Back Exercises These exercises help to make your trunk and back strong. They also help to keep the lower back flexible. Doing these exercises can help to prevent or lessen pain in your lower back. If you have back pain, try to do these exercises 2-3 times each day or as told by your doctor. As you get better, do the exercises once each day. Repeat the exercises more often as told by your doctor. To stop back pain from coming back, do the exercises once each day, or as told by your doctor. Do exercises exactly as told by your doctor. Stop right away if you feel sudden pain or your pain gets worse. Exercises Single knee to chest Do these steps 3-5 times in a row for each leg: Lie on your back on a firm bed or the floor with your legs stretched out. Bring one knee to your chest. Grab your knee or thigh with both hands and hold it in place. Pull on your knee until you feel a gentle stretch in your lower back or butt. Keep doing the stretch for 10-30 seconds. Slowly let go of your leg and straighten it. Pelvic tilt Do these steps 5-10 times in a row: Lie on your back on a firm bed or the floor with your legs stretched out. Bend your knees so they point up to the ceiling. Your feet should be flat on the floor. Tighten your lower belly (abdomen) muscles to press your lower back against the floor. This will make your tailbone point up to the ceiling instead of pointing down to your feet or the floor. Stay in this position for 5-10 seconds while you gently tighten your muscles and breathe evenly. Cat-cow Do these steps until your lower back bends more easily: Get on your hands and knees on a firm bed or the floor. Keep your hands under your shoulders, and keep your knees under your hips. You may put padding under your knees. Let your head hang down toward your chest. Tighten (contract) the muscles in your belly. Point your tailbone toward the floor so your lower back becomes rounded like the back of a  cat. Stay in this position for 5 seconds. Slowly lift your head. Let the muscles of your belly relax. Point your tailbone up toward the ceiling so your back forms a sagging arch like the back of a cow. Stay in this position for 5 seconds.   Bridges Do these steps 10 times in a row: Lie on your back on a firm bed or the floor. Bend your knees so they point up to the ceiling. Your feet should be flat on the floor. Your arms should be flat at your sides, next to your body. Tighten your butt muscles and lift your butt off the floor until your waist is almost as high as your knees. If you do not feel the muscles working in your butt and the back of your thighs, slide your feet 1-2 inches (2.5-5 cm) farther away from your butt. Stay in this position for 3-5 seconds. Slowly lower your butt to the floor, and let your butt muscles relax. If this exercise is too easy, try doing it with your arms crossed over your chest. Belly crunches Do these steps 5-10 times in a row: Lie on your back on a firm bed or the floor with your legs stretched out. Bend your knees so they point up to the ceiling. Your feet should be flat on the floor. Cross your arms  over your chest. Tip your chin a little bit toward your chest, but do not bend your neck. Tighten your belly muscles and slowly raise your chest just enough to lift your shoulder blades a tiny bit off the floor. Avoid raising your body higher than that because it can put too much stress on your lower back. Slowly lower your chest and your head to the floor. Back lifts Do these steps 5-10 times in a row: Lie on your belly (face-down) with your arms at your sides, and rest your forehead on the floor. Tighten the muscles in your legs and your butt. Slowly lift your chest off the floor while you keep your hips on the floor. Keep the back of your head in line with the curve in your back. Look at the floor while you do this. Stay in this position for 3-5  seconds. Slowly lower your chest and your face to the floor. Contact a doctor if: Your back pain gets a lot worse when you do an exercise. Your back pain does not get better within 2 hours after you exercise. If you have any of these problems, stop doing the exercises. Do not do them again unless your doctor says it is okay. Get help right away if: You have sudden, very bad back pain. If this happens, stop doing the exercises. Do not do them again unless your doctor says it is okay. This information is not intended to replace advice given to you by your health care provider. Make sure you discuss any questions you have with your health care provider. Document Revised: 04/07/2020 Document Reviewed: 04/07/2020 Elsevier Patient Education  2024 [employer].

## 2023-04-12 ENCOUNTER — Encounter: Payer: Self-pay | Admitting: Physical Therapy

## 2023-04-12 ENCOUNTER — Ambulatory Visit: Payer: Medicare PPO | Admitting: Physical Therapy

## 2023-04-12 ENCOUNTER — Other Ambulatory Visit: Payer: Self-pay

## 2023-04-12 DIAGNOSIS — M5459 Other low back pain: Secondary | ICD-10-CM

## 2023-04-12 DIAGNOSIS — M6281 Muscle weakness (generalized): Secondary | ICD-10-CM

## 2023-04-12 NOTE — Therapy (Signed)
 OUTPATIENT PHYSICAL THERAPY TREATMENT   Patient Name: Eric Dickerson MRN: [id number] DOB:01-17-1951, 73 y.o., male Today's Date: 04/12/2023   END OF SESSION:  PT End of Session - 04/12/23 1450     Visit Number 5    Number of Visits 17    Date for PT Re-Evaluation 05/21/23    Authorization Type [employer] MCR    Authorization Time Period 04/02/23 - 05/19/23    Authorization - Visit Number 4    Authorization - Number of Visits 12    Progress Note Due on Visit 10    PT Start Time 1445    PT Stop Time 1525    PT Time Calculation (min) 40 min    Activity Tolerance Patient tolerated treatment well    Behavior During Therapy WFL for tasks assessed/performed                Past Medical History:  Diagnosis Date   ANEMIA, MILD    CAD (coronary artery disease)    a. CAD,  b. s/p Cypher DES to the ramus and Promus DES x 2 to the RCA 08/2007 (minimal disease in LAD and CFX at that time), c. Myoview 8/10: EF 63%, inf thinning, small prior ant infarct, no ischemia;  d. s/p CABG 3/14 (L-LAD, RIMA-OM1)   DEGENERATIVE JOINT DISEASE, RIGHT HIP    DIVERTICULOSIS, COLON    ERECTILE DYSFUNCTION    GERD    Heart murmur    HIATAL HERNIA    HTN (hypertension)    Hyperlipidemia    statin intolerant   HYPERPLASIA PROSTATE UNS W/O UR OBST & OTH LUTS    LUMBAR RADICULOPATHY, RIGHT    Other abnormal glucose    PAD (peripheral artery disease) (HCC)    pre CABG 04/2012 => ABIs: Right 0.92, left 0.61 // ABI 09/02/14: R 0.93; L 0.82   SPINAL STENOSIS, LUMBAR    Past Surgical History:  Procedure Laterality Date   COLONOSCOPY  02/07/2000   negative   COLONOSCOPY  08/2017   TA, Dr Myrtie Neither   CORONARY ARTERY BYPASS GRAFT N/A 04/22/2012   Procedure: CORONARY ARTERY BYPASS GRAFTING (CABG);  Surgeon: Loreli Slot, MD;  Location: [facility] OR;  Service: Open Heart Surgery;  Laterality: N/A;  CABG X 2, BIMA, POSSIBLE EVH   CORONARY STENT PLACEMENT  02/07/2008    X 2   TEE WITHOUT CARDIOVERSION N/A 04/22/2012    Procedure: TRANSESOPHAGEAL ECHOCARDIOGRAM (TEE);  Surgeon: Loreli Slot, MD;  Location: [facility] OR;  Service: Open Heart Surgery;  Laterality: N/A;   TOTAL HIP ARTHROPLASTY Right 02/07/2007   UPPER GASTROINTESTINAL ENDOSCOPY     Patient Active Problem List   Diagnosis Date Noted   History of stroke 10/24/2022   CVA (cerebral vascular accident) (HCC) 10/12/2022   Vertebral artery dissection (HCC) 10/11/2022   Acute ischemic stroke (HCC) 10/10/2022   Hypokalemia 10/10/2022   Low magnesium levels 04/01/2022   Vitamin D deficiency 03/27/2021   Statin intolerance 03/24/2021   Vertigo 08/31/2020   OAB (overactive bladder) 05/03/2020   Statin myopathy 05/06/2019   Low back pain 04/22/2019   Pain in joint of right hip 12/12/2017   Elevated PSA 03/01/2017   CAD (coronary artery disease)    HTN (hypertension)    Hyperlipidemia    Impacted cerumen of both ears 11/24/2015   Encounter for well adult exam with abnormal findings 03/29/2015   Allergic rhinitis 03/23/2015   Solar urticaria 07/30/2014   Primary localized osteoarthrosis, lower leg 07/15/2013   Acute  medial meniscal tear 07/15/2013   Other abnormal glucose 09/06/2012   Urgency of urination 09/06/2012   PAD (peripheral artery disease) (HCC) 06/28/2011   ANEMIA, MILD 02/02/2010   Urinary frequency 02/02/2010   SPINAL STENOSIS, LUMBAR 08/05/2008   LUMBAR RADICULOPATHY, RIGHT 07/29/2008   Diaphragmatic hernia 08/19/2007   Diverticulosis of colon 08/19/2007   GERD 05/06/2007   ERECTILE DYSFUNCTION 01/22/2007   BPH (benign prostatic hyperplasia) 01/22/2007   DEGENERATIVE JOINT DISEASE, RIGHT HIP 01/22/2007    PCP: [name], MD  REFERRING PROVIDER: Erick Colace, MD  REFERRING DIAG: Spondylosis without myelopathy or radiculopathy, lumbar region  Rationale for Evaluation and Treatment: Rehabilitation  THERAPY DIAG:  Other low back pain  Muscle weakness (generalized)  ONSET DATE: Chronic, 2  years   SUBJECTIVE:            SUBJECTIVE STATEMENT: Patient reports his left knee is still giving him trouble so he is going back to see the doctor on April 1st to get a shot in his knee. His right hip is doing about the same, was told to just keep working it.    EVAL: Patient reports right sided lower back/hip stiffness, discomfort that keeps him from straightening up. This has been going for about 2 years, and forced him in to retirement. His right side gets aggravated when he tries to stand up straight or when he is walking for a period of time it will start to grab him. It does seem like when he lets it hurt and he pushes through with the walking then it will do better. He reports a hip replacement on the right side that is about 15-40 years old. He had a couple injections that didn't help with his pain. Patient reports having a CVA which affected his balance, and he does still have problems with that.  PERTINENT HISTORY:  See PMH above  PAIN:  Are you having pain? Yes:  NPRS scale: 6/10  At worst 8-9/10 Pain location: Right lower back / hip Pain description: Sore, stabbing Aggravating factors: Standing up straight, walking Relieving factors: Rest, positional change  PRECAUTIONS: None  RED FLAGS: None   WEIGHT BEARING RESTRICTIONS: No  FALLS:  Has patient fallen in last 6 months? No  LIVING ENVIRONMENT: Lives with: lives with their spouse Lives in: House/apartment Stairs: No  OCCUPATION: Retired  PLOF: Independent  PATIENT GOALS: Improve ability to stand up straight and walking tolerance   OBJECTIVE:  Note: Objective measures were completed at Evaluation unless otherwise noted. PATIENT SURVEYS:  Modified Oswestry 24% (12/50)   SENSATION: WFL  MUSCLE LENGTH: Generalized muscle tightness of bilateral hips, hamstrings, quads, and hip flexors  POSTURE:   Trunk flexion in standing  PALPATION: Tender to palpation right lumbar paraspinals, glute med and max,  piriformis region  Hypomobility with lumbar CPAs   LUMBAR ROM:   AROM eval  Flexion 50%  Extension < 25%  Right lateral flexion 25%  Left lateral flexion 50%  Right rotation 50%  Left rotation 50%   (Blank rows = not tested)  LOWER EXTREMITY ROM:      Patient demonstrates limitation with bilateral hip PROM in all directions  LOWER EXTREMITY MMT:    MMT Right eval Left eval Rt / Lt 04/09/2023  Hip flexion 4- 4   Hip extension 3- 3   Hip abduction 3- 3 3- / 3  Hip adduction     Hip internal rotation     Hip external rotation  Knee flexion 5 5   Knee extension 5 5   Ankle dorsiflexion     Ankle plantarflexion     Ankle inversion     Ankle eversion      (Blank rows = not tested)  LUMBAR SPECIAL TESTS:  Not assessed  FUNCTIONAL TESTS:  5xSTS: 24 seconds  GAIT: Assistive device utilized: None Level of assistance: Complete Independence Comments: Trunk flexion, antalgic gait on right   TODAY'S TREATMENT: [facility] Adult PT Treatment:                                                DATE: 04/12/2023 NuStep L6 x 5 min with UE/LE to improve endurance Modified thomas stretch 3 x 30 sec on right Prone piriformis TPR / ART with hip ER Prone lower lumbar CPA mobs Attempted piriformis stretch but patient unable to attain position so performed passively LTR x 10 Bridge 2 x 10 Side clamshell with red 2 x 15 each Seated hamstring stretch 3 x 20 sec each Standing hip abduction with red at knees 2 x 10 each  PATIENT EDUCATION:  Education details: HEP Person educated: Patient Education method: Programmer, multimedia, Demonstration, Actor cues, Verbal cues Education comprehension: verbalized understanding, returned demonstration, verbal cues required, tactile cues required, and needs further education  HOME EXERCISE PROGRAM: Access Code: [email or web address]    ASSESSMENT: CLINICAL IMPRESSION: Patient tolerated therapy well with no adverse effects. He continues to report pain of the right hip  region that keeps him from being able to stand upright. He does continue to exhibit right hip motion limitations and muscular tightness. Address piriformis limitations with visit with manual and stretching with patient reporting improvement in ability to stand upright post treatment. He does exhibit hypomobility of the lumbar spine with CPA but he reports no pain or reproduction of symptoms with this. Continued with strengthening exercises or the right hip with good tolerance. No changes made to HEP this visit. Patient would benefit from continued skilled PT to progress his mobility and strength in order to reduce pain and maximize functional ability.   EVAL: Patient is a 73 y.o. male who was seen today for physical therapy evaluation and treatment for chronic right lower back / hip pain. He currently demonstrates limitations in both his lumbar and hip motion, gross strength deficits of his hips, generalized muscle tightness, and right sided pain with upright posture and walking this is impacting his functional ability.   OBJECTIVE IMPAIRMENTS: Abnormal gait, decreased activity tolerance, decreased balance, decreased ROM, decreased strength, impaired flexibility, postural dysfunction, and pain.   ACTIVITY LIMITATIONS: carrying, lifting, standing, and locomotion level  PARTICIPATION LIMITATIONS: community activity and yard work  PERSONAL FACTORS: Fitness, Past/current experiences, and Time since onset of injury/illness/exacerbation are also affecting patient's functional outcome.    GOALS: Goals reviewed with patient? Yes  SHORT TERM GOALS: Target date: 04/23/2023  Patient will be I with initial HEP in order to progress with therapy. Baseline: HEP provided at eval Goal status: INITIAL  2.  Patient will report right lower back pain with standing or walking </= 6/10 in order to reduce functional limitations Baseline: 8-9/10 pain Goal status: INITIAL  LONG TERM GOALS: Target date:  05/21/2023  Patient will be I with final HEP to maintain progress from PT. Baseline: HEP provided at eval Goal status: INITIAL  2.  Patient will report modified oswestry </=  10% (5/50) in order to indicate improvement in functional status Baseline: 24% (12/50) Goal status: INITIAL  3.  Patient will demonstrate hip strength >/= 4-/5 MMT in order to improve walking tolerance Baseline: 3-/5 MMT right hip strength Goal status: INITIAL  4.  Patient will report pain with standing up straight or walking </= 3/10 in order to reduce functional limitations Baseline: 8-9/10 pain Goal status: INITIAL  5. Patient will demonstrate 5xSTS </= 12 seconds to indicate improvement in strength and mobility with a reduced fall risk  Baseline: 24 seconds  Goals status: INITIAL   PLAN: PT FREQUENCY: 1-2x/week  PT DURATION: 8 weeks  PLANNED INTERVENTIONS: 97164- PT Re-evaluation, 97110-Therapeutic exercises, 97530- Therapeutic activity, 97112- Neuromuscular re-education, 97535- Self Care, 81191- Manual therapy, [phone]- Gait training, Patient/Family education, Balance training, Dry Needling, Joint mobilization, Joint manipulation, Spinal manipulation, Spinal mobilization, Cryotherapy, and Moist heat.  PLAN FOR NEXT SESSION: Review HEP and progress PRN, manual/mobs for lumbar region and initiate lumbar mobility/stretching, hip flexor/quad stretch, manual for piriformis and stretching as able, progress hip strengthening   Rosana Hoes, PT, DPT, LAT, ATC 04/12/23  4:32 PM Phone: [phone] Fax: [phone]

## 2023-04-16 ENCOUNTER — Other Ambulatory Visit: Payer: Self-pay

## 2023-04-16 ENCOUNTER — Encounter: Payer: Self-pay | Admitting: Physical Therapy

## 2023-04-16 ENCOUNTER — Ambulatory Visit: Payer: Medicare PPO | Admitting: Physical Therapy

## 2023-04-16 DIAGNOSIS — M6281 Muscle weakness (generalized): Secondary | ICD-10-CM

## 2023-04-16 DIAGNOSIS — M5459 Other low back pain: Secondary | ICD-10-CM

## 2023-04-16 NOTE — Therapy (Signed)
 OUTPATIENT PHYSICAL THERAPY TREATMENT   Patient Name: Eric Dickerson MRN: [id number] DOB:08/17/1950, 73 y.o., male Today's Date: 04/16/2023   END OF SESSION:  PT End of Session - 04/16/23 1439     Visit Number 6    Number of Visits 17    Date for PT Re-Evaluation 05/21/23    Authorization Type [employer] MCR    Authorization Time Period 04/02/23 - 05/19/23    Authorization - Visit Number 5    Authorization - Number of Visits 12    Progress Note Due on Visit 10    PT Start Time 1440    PT Stop Time 1520    PT Time Calculation (min) 40 min    Activity Tolerance Patient tolerated treatment well    Behavior During Therapy WFL for tasks assessed/performed                 Past Medical History:  Diagnosis Date   ANEMIA, MILD    CAD (coronary artery disease)    a. CAD,  b. s/p Cypher DES to the ramus and Promus DES x 2 to the RCA 08/2007 (minimal disease in LAD and CFX at that time), c. Myoview 8/10: EF 63%, inf thinning, small prior ant infarct, no ischemia;  d. s/p CABG 3/14 (L-LAD, RIMA-OM1)   DEGENERATIVE JOINT DISEASE, RIGHT HIP    DIVERTICULOSIS, COLON    ERECTILE DYSFUNCTION    GERD    Heart murmur    HIATAL HERNIA    HTN (hypertension)    Hyperlipidemia    statin intolerant   HYPERPLASIA PROSTATE UNS W/O UR OBST & OTH LUTS    LUMBAR RADICULOPATHY, RIGHT    Other abnormal glucose    PAD (peripheral artery disease) (HCC)    pre CABG 04/2012 => ABIs: Right 0.92, left 0.61 // ABI 09/02/14: R 0.93; L 0.82   SPINAL STENOSIS, LUMBAR    Past Surgical History:  Procedure Laterality Date   COLONOSCOPY  02/07/2000   negative   COLONOSCOPY  08/2017   TA, Dr Myrtie Neither   CORONARY ARTERY BYPASS GRAFT N/A 04/22/2012   Procedure: CORONARY ARTERY BYPASS GRAFTING (CABG);  Surgeon: Loreli Slot, MD;  Location: [facility] OR;  Service: Open Heart Surgery;  Laterality: N/A;  CABG X 2, BIMA, POSSIBLE EVH   CORONARY STENT PLACEMENT  02/07/2008    X 2   TEE WITHOUT CARDIOVERSION N/A  04/22/2012   Procedure: TRANSESOPHAGEAL ECHOCARDIOGRAM (TEE);  Surgeon: Loreli Slot, MD;  Location: [facility] OR;  Service: Open Heart Surgery;  Laterality: N/A;   TOTAL HIP ARTHROPLASTY Right 02/07/2007   UPPER GASTROINTESTINAL ENDOSCOPY     Patient Active Problem List   Diagnosis Date Noted   History of stroke 10/24/2022   CVA (cerebral vascular accident) (HCC) 10/12/2022   Vertebral artery dissection (HCC) 10/11/2022   Acute ischemic stroke (HCC) 10/10/2022   Hypokalemia 10/10/2022   Low magnesium levels 04/01/2022   Vitamin D deficiency 03/27/2021   Statin intolerance 03/24/2021   Vertigo 08/31/2020   OAB (overactive bladder) 05/03/2020   Statin myopathy 05/06/2019   Low back pain 04/22/2019   Pain in joint of right hip 12/12/2017   Elevated PSA 03/01/2017   CAD (coronary artery disease)    HTN (hypertension)    Hyperlipidemia    Impacted cerumen of both ears 11/24/2015   Encounter for well adult exam with abnormal findings 03/29/2015   Allergic rhinitis 03/23/2015   Solar urticaria 07/30/2014   Primary localized osteoarthrosis, lower leg 07/15/2013  Acute medial meniscal tear 07/15/2013   Other abnormal glucose 09/06/2012   Urgency of urination 09/06/2012   PAD (peripheral artery disease) (HCC) 06/28/2011   ANEMIA, MILD 02/02/2010   Urinary frequency 02/02/2010   SPINAL STENOSIS, LUMBAR 08/05/2008   LUMBAR RADICULOPATHY, RIGHT 07/29/2008   Diaphragmatic hernia 08/19/2007   Diverticulosis of colon 08/19/2007   GERD 05/06/2007   ERECTILE DYSFUNCTION 01/22/2007   BPH (benign prostatic hyperplasia) 01/22/2007   DEGENERATIVE JOINT DISEASE, RIGHT HIP 01/22/2007    PCP: [name], MD  REFERRING PROVIDER: Erick Colace, MD  REFERRING DIAG: Spondylosis without myelopathy or radiculopathy, lumbar region  Rationale for Evaluation and Treatment: Rehabilitation  THERAPY DIAG:  Other low back pain  Muscle weakness (generalized)  ONSET DATE: Chronic, 2  years   SUBJECTIVE:            SUBJECTIVE STATEMENT: Patient reports he felt good for 2 days after last visit.    EVAL: Patient reports right sided lower back/hip stiffness, discomfort that keeps him from straightening up. This has been going for about 2 years, and forced him in to retirement. His right side gets aggravated when he tries to stand up straight or when he is walking for a period of time it will start to grab him. It does seem like when he lets it hurt and he pushes through with the walking then it will do better. He reports a hip replacement on the right side that is about 33-54 years old. He had a couple injections that didn't help with his pain. Patient reports having a CVA which affected his balance, and he does still have problems with that.  PERTINENT HISTORY:  See PMH above  PAIN:  Are you having pain? Yes:  NPRS scale: 6/10  At worst 8-9/10 Pain location: Right lower back / hip Pain description: Sore, stabbing Aggravating factors: Standing up straight, walking Relieving factors: Rest, positional change  PRECAUTIONS: None  RED FLAGS: None   WEIGHT BEARING RESTRICTIONS: No  FALLS:  Has patient fallen in last 6 months? No  LIVING ENVIRONMENT: Lives with: lives with their spouse Lives in: House/apartment Stairs: No  OCCUPATION: Retired  PLOF: Independent  PATIENT GOALS: Improve ability to stand up straight and walking tolerance   OBJECTIVE:  Note: Objective measures were completed at Evaluation unless otherwise noted. PATIENT SURVEYS:  Modified Oswestry 24% (12/50)   SENSATION: WFL  MUSCLE LENGTH: Generalized muscle tightness of bilateral hips, hamstrings, quads, and hip flexors  POSTURE:   Trunk flexion in standing  PALPATION: Tender to palpation right lumbar paraspinals, glute med and max, piriformis region  Hypomobility with lumbar CPAs   LUMBAR ROM:   AROM eval  Flexion 50%  Extension < 25%  Right lateral flexion 25%  Left  lateral flexion 50%  Right rotation 50%  Left rotation 50%   (Blank rows = not tested)  LOWER EXTREMITY ROM:      Patient demonstrates limitation with bilateral hip PROM in all directions  LOWER EXTREMITY MMT:    MMT Right eval Left eval Rt / Lt 04/09/2023 Right 04/16/2023  Hip flexion 4- 4    Hip extension 3- 3  3-  Hip abduction 3- 3 3- / 3 3-  Hip adduction      Hip internal rotation      Hip external rotation      Knee flexion 5 5    Knee extension 5 5    Ankle dorsiflexion      Ankle plantarflexion  Ankle inversion      Ankle eversion       (Blank rows = not tested)  LUMBAR SPECIAL TESTS:  Not assessed  FUNCTIONAL TESTS:  5xSTS: 24 seconds  GAIT: Assistive device utilized: None Level of assistance: Complete Independence Comments: Trunk flexion, antalgic gait on right   TODAY'S TREATMENT: [facility] Adult PT Treatment:                                                DATE: 04/16/2023 NuStep L7 x 5 min with UE/LE to improve endurance and workload capacity Prone piriformis TPR / ART with hip ER Prone quad and hip flexor stretch 3 x 30 sec Prone straight leg hip extension with 2 pillows under lower abdomen 3 x 10 SMFR using tennis ball against wall for right piriformis region Bridge 2 x 10 Hooklying unilateral clamshell with green 2 x 10 each Goblet squat to table with 15# KB 3 x 10  PATIENT EDUCATION:  Education details: HEP update Person educated: Patient Education method: Explanation, Demonstration, Tactile cues, Verbal cues, Handout Education comprehension: verbalized understanding, returned demonstration, verbal cues required, tactile cues required, and needs further education  HOME EXERCISE PROGRAM: Access Code: [email or web address]    ASSESSMENT: CLINICAL IMPRESSION: Patient tolerated therapy well with no adverse effects. Continued with manual to the right piriformis region as patient reports this was beneficial and provided him with a tennis ball for SMFR at home.  Therapy continued to focus on improving right hip flexibility and progressing his hip strengthening and control with good tolerance. He does continue to exhibit gross right hip strength deficit and updated his HEP this visit to progress prone hip strengthening. Patient would benefit from continued skilled PT to progress his mobility and strength in order to reduce pain and maximize functional ability.   EVAL: Patient is a 73 y.o. male who was seen today for physical therapy evaluation and treatment for chronic right lower back / hip pain. He currently demonstrates limitations in both his lumbar and hip motion, gross strength deficits of his hips, generalized muscle tightness, and right sided pain with upright posture and walking this is impacting his functional ability.   OBJECTIVE IMPAIRMENTS: Abnormal gait, decreased activity tolerance, decreased balance, decreased ROM, decreased strength, impaired flexibility, postural dysfunction, and pain.   ACTIVITY LIMITATIONS: carrying, lifting, standing, and locomotion level  PARTICIPATION LIMITATIONS: community activity and yard work  PERSONAL FACTORS: Fitness, Past/current experiences, and Time since onset of injury/illness/exacerbation are also affecting patient's functional outcome.    GOALS: Goals reviewed with patient? Yes  SHORT TERM GOALS: Target date: 04/23/2023  Patient will be I with initial HEP in order to progress with therapy. Baseline: HEP provided at eval Goal status: INITIAL  2.  Patient will report right lower back pain with standing or walking </= 6/10 in order to reduce functional limitations Baseline: 8-9/10 pain Goal status: INITIAL  LONG TERM GOALS: Target date: 05/21/2023  Patient will be I with final HEP to maintain progress from PT. Baseline: HEP provided at eval Goal status: INITIAL  2.  Patient will report modified oswestry </= 10% (5/50) in order to indicate improvement in functional status Baseline: 24%  (12/50) Goal status: INITIAL  3.  Patient will demonstrate hip strength >/= 4-/5 MMT in order to improve walking tolerance Baseline: 3-/5 MMT right hip strength Goal status: INITIAL  4.  Patient will report pain with standing up straight or walking </= 3/10 in order to reduce functional limitations Baseline: 8-9/10 pain Goal status: INITIAL  5. Patient will demonstrate 5xSTS </= 12 seconds to indicate improvement in strength and mobility with a reduced fall risk  Baseline: 24 seconds  Goals status: INITIAL   PLAN: PT FREQUENCY: 1-2x/week  PT DURATION: 8 weeks  PLANNED INTERVENTIONS: 97164- PT Re-evaluation, 97110-Therapeutic exercises, 97530- Therapeutic activity, 97112- Neuromuscular re-education, 97535- Self Care, 09811- Manual therapy, [phone]- Gait training, Patient/Family education, Balance training, Dry Needling, Joint mobilization, Joint manipulation, Spinal manipulation, Spinal mobilization, Cryotherapy, and Moist heat.  PLAN FOR NEXT SESSION: Review HEP and progress PRN, manual/mobs for lumbar region and initiate lumbar mobility/stretching, hip flexor/quad stretch, manual for piriformis and stretching as able, progress hip strengthening   Rosana Hoes, PT, DPT, LAT, ATC 04/16/23  3:28 PM Phone: [phone] Fax: [phone]

## 2023-04-19 ENCOUNTER — Ambulatory Visit: Payer: Medicare PPO | Admitting: Physical Therapy

## 2023-04-23 ENCOUNTER — Ambulatory Visit: Payer: Medicare PPO | Admitting: Physical Therapy

## 2023-04-23 ENCOUNTER — Encounter: Payer: Self-pay | Admitting: Physical Therapy

## 2023-04-23 ENCOUNTER — Other Ambulatory Visit: Payer: Self-pay

## 2023-04-23 DIAGNOSIS — M5459 Other low back pain: Secondary | ICD-10-CM

## 2023-04-23 DIAGNOSIS — M6281 Muscle weakness (generalized): Secondary | ICD-10-CM

## 2023-04-23 NOTE — Therapy (Signed)
 OUTPATIENT PHYSICAL THERAPY TREATMENT   Patient Name: Eric Dickerson MRN: [id number] DOB:1950-04-17, 73 y.o., male Today's Date: 04/23/2023   END OF SESSION:  PT End of Session - 04/23/23 1505     Visit Number 7    Number of Visits 17    Date for PT Re-Evaluation 05/21/23    Authorization Type [employer] MCR    Authorization Time Period 04/02/23 - 05/19/23    Authorization - Visit Number 6    Authorization - Number of Visits 12    Progress Note Due on Visit 10    PT Start Time 1455    PT Stop Time 1540    PT Time Calculation (min) 45 min    Activity Tolerance Patient tolerated treatment well    Behavior During Therapy [facility] for tasks assessed/performed                  Past Medical History:  Diagnosis Date   ANEMIA, MILD    CAD (coronary artery disease)    a. CAD,  b. s/p Cypher DES to the ramus and Promus DES x 2 to the RCA 08/2007 (minimal disease in LAD and CFX at that time), c. Myoview 8/10: EF 63%, inf thinning, small prior ant infarct, no ischemia;  d. s/p CABG 3/14 (L-LAD, RIMA-OM1)   DEGENERATIVE JOINT DISEASE, RIGHT HIP    DIVERTICULOSIS, COLON    ERECTILE DYSFUNCTION    GERD    Heart murmur    HIATAL HERNIA    HTN (hypertension)    Hyperlipidemia    statin intolerant   HYPERPLASIA PROSTATE UNS W/O UR OBST & OTH LUTS    LUMBAR RADICULOPATHY, RIGHT    Other abnormal glucose    PAD (peripheral artery disease) (HCC)    pre CABG 04/2012 => ABIs: Right 0.92, left 0.61 // ABI 09/02/14: R 0.93; L 0.82   SPINAL STENOSIS, LUMBAR    Past Surgical History:  Procedure Laterality Date   COLONOSCOPY  02/07/2000   negative   COLONOSCOPY  08/2017   TA, Dr Myrtie Neither   CORONARY ARTERY BYPASS GRAFT N/A 04/22/2012   Procedure: CORONARY ARTERY BYPASS GRAFTING (CABG);  Surgeon: Loreli Slot, MD;  Location: [facility] OR;  Service: Open Heart Surgery;  Laterality: N/A;  CABG X 2, BIMA, POSSIBLE EVH   CORONARY STENT PLACEMENT  02/07/2008    X 2   TEE WITHOUT CARDIOVERSION N/A  04/22/2012   Procedure: TRANSESOPHAGEAL ECHOCARDIOGRAM (TEE);  Surgeon: Loreli Slot, MD;  Location: [facility] OR;  Service: Open Heart Surgery;  Laterality: N/A;   TOTAL HIP ARTHROPLASTY Right 02/07/2007   UPPER GASTROINTESTINAL ENDOSCOPY     Patient Active Problem List   Diagnosis Date Noted   History of stroke 10/24/2022   CVA (cerebral vascular accident) (HCC) 10/12/2022   Vertebral artery dissection (HCC) 10/11/2022   Acute ischemic stroke (HCC) 10/10/2022   Hypokalemia 10/10/2022   Low magnesium levels 04/01/2022   Vitamin D deficiency 03/27/2021   Statin intolerance 03/24/2021   Vertigo 08/31/2020   OAB (overactive bladder) 05/03/2020   Statin myopathy 05/06/2019   Low back pain 04/22/2019   Pain in joint of right hip 12/12/2017   Elevated PSA 03/01/2017   CAD (coronary artery disease)    HTN (hypertension)    Hyperlipidemia    Impacted cerumen of both ears 11/24/2015   Encounter for well adult exam with abnormal findings 03/29/2015   Allergic rhinitis 03/23/2015   Solar urticaria 07/30/2014   Primary localized osteoarthrosis, lower leg 07/15/2013  Acute medial meniscal tear 07/15/2013   Other abnormal glucose 09/06/2012   Urgency of urination 09/06/2012   PAD (peripheral artery disease) (HCC) 06/28/2011   ANEMIA, MILD 02/02/2010   Urinary frequency 02/02/2010   SPINAL STENOSIS, LUMBAR 08/05/2008   LUMBAR RADICULOPATHY, RIGHT 07/29/2008   Diaphragmatic hernia 08/19/2007   Diverticulosis of colon 08/19/2007   GERD 05/06/2007   ERECTILE DYSFUNCTION 01/22/2007   BPH (benign prostatic hyperplasia) 01/22/2007   DEGENERATIVE JOINT DISEASE, RIGHT HIP 01/22/2007    PCP: [name], MD  REFERRING PROVIDER: Erick Colace, MD  REFERRING DIAG: Spondylosis without myelopathy or radiculopathy, lumbar region  Rationale for Evaluation and Treatment: Rehabilitation  THERAPY DIAG:  Other low back pain  Muscle weakness (generalized)  ONSET DATE: Chronic, 2  years   SUBJECTIVE:            SUBJECTIVE STATEMENT: Patient reports he is feeling better today.   EVAL: Patient reports right sided lower back/hip stiffness, discomfort that keeps him from straightening up. This has been going for about 2 years, and forced him in to retirement. His right side gets aggravated when he tries to stand up straight or when he is walking for a period of time it will start to grab him. It does seem like when he lets it hurt and he pushes through with the walking then it will do better. He reports a hip replacement on the right side that is about 71-49 years old. He had a couple injections that didn't help with his pain. Patient reports having a CVA which affected his balance, and he does still have problems with that.  PERTINENT HISTORY:  See PMH above  PAIN:  Are you having pain? Yes:  NPRS scale: 4/10  At worst 6/10 Pain location: Right lower back / hip Pain description: Sore, stabbing Aggravating factors: Standing up straight, walking Relieving factors: Rest, positional change  PRECAUTIONS: None  RED FLAGS: None   WEIGHT BEARING RESTRICTIONS: No  FALLS:  Has patient fallen in last 6 months? No  LIVING ENVIRONMENT: Lives with: lives with their spouse Lives in: House/apartment Stairs: No  OCCUPATION: Retired  PLOF: Independent  PATIENT GOALS: Improve ability to stand up straight and walking tolerance   OBJECTIVE:  Note: Objective measures were completed at Evaluation unless otherwise noted. PATIENT SURVEYS:  Modified Oswestry 24% (12/50)   04/23/2023: 20% (10/50)  SENSATION: WFL  MUSCLE LENGTH: Generalized muscle tightness of bilateral hips, hamstrings, quads, and hip flexors  POSTURE:   Trunk flexion in standing  PALPATION: Tender to palpation right lumbar paraspinals, glute med and max, piriformis region  Hypomobility with lumbar CPAs   LUMBAR ROM:   AROM eval  Flexion 50%  Extension < 25%  Right lateral flexion 25%   Left lateral flexion 50%  Right rotation 50%  Left rotation 50%   (Blank rows = not tested)  LOWER EXTREMITY ROM:      Patient demonstrates limitation with bilateral hip PROM in all directions  LOWER EXTREMITY MMT:    MMT Right eval Left eval Rt / Lt 04/09/2023 Right 04/16/2023  Hip flexion 4- 4    Hip extension 3- 3  3-  Hip abduction 3- 3 3- / 3 3-  Hip adduction      Hip internal rotation      Hip external rotation      Knee flexion 5 5    Knee extension 5 5    Ankle dorsiflexion      Ankle plantarflexion  Ankle inversion      Ankle eversion       (Blank rows = not tested)  LUMBAR SPECIAL TESTS:  Not assessed  FUNCTIONAL TESTS:  5xSTS: 24 seconds  GAIT: Assistive device utilized: None Level of assistance: Complete Independence Comments: Trunk flexion, antalgic gait on right   TODAY'S TREATMENT: [facility] Adult PT Treatment:                                                DATE: 04/16/2023 NuStep L6 x 5 min with UE/LE to improve endurance and workload capacity Prone piriformis TPR / ART with hip ER Prone quad and hip flexor stretch 3 x 30 sec LTR x 10 Bridge 2 x 10 Hooklying unilateral clamshell with green 2 x 10 each Sidelying hip abduction 2 x 10 Goblet squat to table with 15# KB 3 x 10 Standing hip abduction and extension with green at knee 2 x 10 each  PATIENT EDUCATION:  Education details: HEP Person educated: Patient Education method: Programmer, multimedia, Demonstration, Actor cues, Verbal cues Education comprehension: verbalized understanding, returned demonstration, verbal cues required, tactile cues required, and needs further education  HOME EXERCISE PROGRAM: Access Code: [email or web address]    ASSESSMENT: CLINICAL IMPRESSION: Patient tolerated therapy well with no adverse effects. Continued with manual to the right piriformis region as patient reports this continues to be helpful. He did report less tenderness to the right gluteal and piriformis region this  visit. Therapy continued to focus on improving right hip flexibility and progressing his hip strengthening and control with good tolerance. He does report an improvement in his low back function on the modified oswestry this visit. No changes to HEP this visit. Patient would benefit from continued skilled PT to progress his mobility and strength in order to reduce pain and maximize functional ability.   EVAL: Patient is a 73 y.o. male who was seen today for physical therapy evaluation and treatment for chronic right lower back / hip pain. He currently demonstrates limitations in both his lumbar and hip motion, gross strength deficits of his hips, generalized muscle tightness, and right sided pain with upright posture and walking this is impacting his functional ability.   OBJECTIVE IMPAIRMENTS: Abnormal gait, decreased activity tolerance, decreased balance, decreased ROM, decreased strength, impaired flexibility, postural dysfunction, and pain.   ACTIVITY LIMITATIONS: carrying, lifting, standing, and locomotion level  PARTICIPATION LIMITATIONS: community activity and yard work  PERSONAL FACTORS: Fitness, Past/current experiences, and Time since onset of injury/illness/exacerbation are also affecting patient's functional outcome.    GOALS: Goals reviewed with patient? Yes  SHORT TERM GOALS: Target date: 04/23/2023  Patient will be I with initial HEP in order to progress with therapy. Baseline: HEP provided at eval 04/23/2023: independent Goal status: MET  2.  Patient will report right lower back pain with standing or walking </= 6/10 in order to reduce functional limitations Baseline: 8-9/10 pain 04/23/2023: 6/10  Goal status: MET  LONG TERM GOALS: Target date: 05/21/2023  Patient will be I with final HEP to maintain progress from PT. Baseline: HEP provided at eval Goal status: INITIAL  2.  Patient will report modified oswestry </= 10% (5/50) in order to indicate improvement in  functional status Baseline: 24% (12/50) 04/23/2023: 20% (10/50) Goal status: ONGOING  3.  Patient will demonstrate hip strength >/= 4-/5 MMT in order to improve walking tolerance Baseline: 3-/5  MMT right hip strength Goal status: INITIAL  4.  Patient will report pain with standing up straight or walking </= 3/10 in order to reduce functional limitations Baseline: 8-9/10 pain Goal status: INITIAL  5. Patient will demonstrate 5xSTS </= 12 seconds to indicate improvement in strength and mobility with a reduced fall risk  Baseline: 24 seconds  Goals status: INITIAL   PLAN: PT FREQUENCY: 1-2x/week  PT DURATION: 8 weeks  PLANNED INTERVENTIONS: 97164- PT Re-evaluation, 97110-Therapeutic exercises, 97530- Therapeutic activity, 97112- Neuromuscular re-education, 97535- Self Care, 13244- Manual therapy, [phone]- Gait training, Patient/Family education, Balance training, Dry Needling, Joint mobilization, Joint manipulation, Spinal manipulation, Spinal mobilization, Cryotherapy, and Moist heat.  PLAN FOR NEXT SESSION: Review HEP and progress PRN, manual/mobs for lumbar region and initiate lumbar mobility/stretching, hip flexor/quad stretch, manual for piriformis and stretching as able, progress hip strengthening   Rosana Hoes, PT, DPT, LAT, ATC 04/23/23  3:49 PM Phone: [phone] Fax: [phone]

## 2023-04-25 ENCOUNTER — Ambulatory Visit

## 2023-04-25 DIAGNOSIS — M5459 Other low back pain: Secondary | ICD-10-CM | POA: Diagnosis not present

## 2023-04-25 DIAGNOSIS — M6281 Muscle weakness (generalized): Secondary | ICD-10-CM

## 2023-04-25 NOTE — Therapy (Signed)
 OUTPATIENT PHYSICAL THERAPY TREATMENT   Patient Name: Eric Dickerson MRN: [id number] DOB:29-Aug-1950, 73 y.o., male Today's Date: 04/25/2023   END OF SESSION:  PT End of Session - 04/25/23 1528     Visit Number 8    Number of Visits 17    Date for PT Re-Evaluation 05/21/23    Authorization Type [employer] MCR    Authorization Time Period 04/02/23 - 05/19/23    Authorization - Visit Number 7    Authorization - Number of Visits 12    Progress Note Due on Visit 10    PT Start Time 1531    PT Stop Time 1611    PT Time Calculation (min) 40 min    Activity Tolerance Patient tolerated treatment well    Behavior During Therapy [facility] for tasks assessed/performed                   Past Medical History:  Diagnosis Date   ANEMIA, MILD    CAD (coronary artery disease)    a. CAD,  b. s/p Cypher DES to the ramus and Promus DES x 2 to the RCA 08/2007 (minimal disease in LAD and CFX at that time), c. Myoview 8/10: EF 63%, inf thinning, small prior ant infarct, no ischemia;  d. s/p CABG 3/14 (L-LAD, RIMA-OM1)   DEGENERATIVE JOINT DISEASE, RIGHT HIP    DIVERTICULOSIS, COLON    ERECTILE DYSFUNCTION    GERD    Heart murmur    HIATAL HERNIA    HTN (hypertension)    Hyperlipidemia    statin intolerant   HYPERPLASIA PROSTATE UNS W/O UR OBST & OTH LUTS    LUMBAR RADICULOPATHY, RIGHT    Other abnormal glucose    PAD (peripheral artery disease) (HCC)    pre CABG 04/2012 => ABIs: Right 0.92, left 0.61 // ABI 09/02/14: R 0.93; L 0.82   SPINAL STENOSIS, LUMBAR    Past Surgical History:  Procedure Laterality Date   COLONOSCOPY  02/07/2000   negative   COLONOSCOPY  08/2017   TA, Dr Myrtie Neither   CORONARY ARTERY BYPASS GRAFT N/A 04/22/2012   Procedure: CORONARY ARTERY BYPASS GRAFTING (CABG);  Surgeon: Loreli Slot, MD;  Location: [facility] OR;  Service: Open Heart Surgery;  Laterality: N/A;  CABG X 2, BIMA, POSSIBLE EVH   CORONARY STENT PLACEMENT  02/07/2008    X 2   TEE WITHOUT CARDIOVERSION N/A  04/22/2012   Procedure: TRANSESOPHAGEAL ECHOCARDIOGRAM (TEE);  Surgeon: Loreli Slot, MD;  Location: [facility] OR;  Service: Open Heart Surgery;  Laterality: N/A;   TOTAL HIP ARTHROPLASTY Right 02/07/2007   UPPER GASTROINTESTINAL ENDOSCOPY     Patient Active Problem List   Diagnosis Date Noted   History of stroke 10/24/2022   CVA (cerebral vascular accident) (HCC) 10/12/2022   Vertebral artery dissection (HCC) 10/11/2022   Acute ischemic stroke (HCC) 10/10/2022   Hypokalemia 10/10/2022   Low magnesium levels 04/01/2022   Vitamin D deficiency 03/27/2021   Statin intolerance 03/24/2021   Vertigo 08/31/2020   OAB (overactive bladder) 05/03/2020   Statin myopathy 05/06/2019   Low back pain 04/22/2019   Pain in joint of right hip 12/12/2017   Elevated PSA 03/01/2017   CAD (coronary artery disease)    HTN (hypertension)    Hyperlipidemia    Impacted cerumen of both ears 11/24/2015   Encounter for well adult exam with abnormal findings 03/29/2015   Allergic rhinitis 03/23/2015   Solar urticaria 07/30/2014   Primary localized osteoarthrosis, lower leg 07/15/2013  Acute medial meniscal tear 07/15/2013   Other abnormal glucose 09/06/2012   Urgency of urination 09/06/2012   PAD (peripheral artery disease) (HCC) 06/28/2011   ANEMIA, MILD 02/02/2010   Urinary frequency 02/02/2010   SPINAL STENOSIS, LUMBAR 08/05/2008   LUMBAR RADICULOPATHY, RIGHT 07/29/2008   Diaphragmatic hernia 08/19/2007   Diverticulosis of colon 08/19/2007   GERD 05/06/2007   ERECTILE DYSFUNCTION 01/22/2007   BPH (benign prostatic hyperplasia) 01/22/2007   DEGENERATIVE JOINT DISEASE, RIGHT HIP 01/22/2007    PCP: [name], MD  REFERRING PROVIDER: Erick Colace, MD  REFERRING DIAG: Spondylosis without myelopathy or radiculopathy, lumbar region  Rationale for Evaluation and Treatment: Rehabilitation  THERAPY DIAG:  Other low back pain  Muscle weakness (generalized)  ONSET DATE: Chronic, 2  years   SUBJECTIVE:            SUBJECTIVE STATEMENT: Pt presents to PT with reports of continued LBP. Notes that he is frustrated he continues to have pain, is worried there is something more severe going on.   EVAL: Patient reports right sided lower back/hip stiffness, discomfort that keeps him from straightening up. This has been going for about 2 years, and forced him in to retirement. His right side gets aggravated when he tries to stand up straight or when he is walking for a period of time it will start to grab him. It does seem like when he lets it hurt and he pushes through with the walking then it will do better. He reports a hip replacement on the right side that is about 56-76 years old. He had a couple injections that didn't help with his pain. Patient reports having a CVA which affected his balance, and he does still have problems with that.  PERTINENT HISTORY:  See PMH above  PAIN:  Are you having pain? Yes:  NPRS scale: 4/10  At worst 6/10 Pain location: Right lower back / hip Pain description: Sore, stabbing Aggravating factors: Standing up straight, walking Relieving factors: Rest, positional change  PRECAUTIONS: None  RED FLAGS: None   WEIGHT BEARING RESTRICTIONS: No  FALLS:  Has patient fallen in last 6 months? No  LIVING ENVIRONMENT: Lives with: lives with their spouse Lives in: House/apartment Stairs: No  OCCUPATION: Retired  PLOF: Independent  PATIENT GOALS: Improve ability to stand up straight and walking tolerance   OBJECTIVE:  Note: Objective measures were completed at Evaluation unless otherwise noted. PATIENT SURVEYS:  Modified Oswestry 24% (12/50)   04/23/2023: 20% (10/50)  SENSATION: WFL  MUSCLE LENGTH: Generalized muscle tightness of bilateral hips, hamstrings, quads, and hip flexors  POSTURE:   Trunk flexion in standing  PALPATION: Tender to palpation right lumbar paraspinals, glute med and max, piriformis region  Hypomobility  with lumbar CPAs   LUMBAR ROM:   AROM eval  Flexion 50%  Extension < 25%  Right lateral flexion 25%  Left lateral flexion 50%  Right rotation 50%  Left rotation 50%   (Blank rows = not tested)  LOWER EXTREMITY ROM:      Patient demonstrates limitation with bilateral hip PROM in all directions  LOWER EXTREMITY MMT:    MMT Right eval Left eval Rt / Lt 04/09/2023 Right 04/16/2023  Hip flexion 4- 4    Hip extension 3- 3  3-  Hip abduction 3- 3 3- / 3 3-  Hip adduction      Hip internal rotation      Hip external rotation      Knee flexion 5 5  Knee extension 5 5    Ankle dorsiflexion      Ankle plantarflexion      Ankle inversion      Ankle eversion       (Blank rows = not tested)  LUMBAR SPECIAL TESTS:  Not assessed  FUNCTIONAL TESTS:  5xSTS: 24 seconds  GAIT: Assistive device utilized: None Level of assistance: Complete Independence Comments: Trunk flexion, antalgic gait on right   TODAY'S TREATMENT: [facility] Adult PT Treatment:                                                DATE: 04/25/2023 NuStep L6 x 4 min with UE/LE to improve endurance and workload capacity Modified thomas stretch 2x60" Bridge with GTB 2x15 Hooklying unilateral clamshell with green 2 x 10 each Supine pilates SLR 2x10 each Seated repeated lumbar flexion 2x10 with physioball Standing FW row 2x10 17# Pallof press x 10 7# Lateral walk RTB x 4 laps at counter Standing hip ext 2x10 RTB  PATIENT EDUCATION:  Education details: HEP Person educated: Patient Education method: Programmer, multimedia, Demonstration, Tactile cues, Verbal cues Education comprehension: verbalized understanding, returned demonstration, verbal cues required, tactile cues required, and needs further education  HOME EXERCISE PROGRAM: Access Code: [email or web address]    ASSESSMENT: CLINICAL IMPRESSION: Pt was able to complete all prescribed exercises with no adverse effect or increase in pain. Therapy focused on improving core and  proximal hip strength as well as hip flexor mobility. Pt is progressing with therapy, will assess goals and possible discharge at next session.   EVAL: Patient is a 73 y.o. male who was seen today for physical therapy evaluation and treatment for chronic right lower back / hip pain. He currently demonstrates limitations in both his lumbar and hip motion, gross strength deficits of his hips, generalized muscle tightness, and right sided pain with upright posture and walking this is impacting his functional ability.   OBJECTIVE IMPAIRMENTS: Abnormal gait, decreased activity tolerance, decreased balance, decreased ROM, decreased strength, impaired flexibility, postural dysfunction, and pain.   ACTIVITY LIMITATIONS: carrying, lifting, standing, and locomotion level  PARTICIPATION LIMITATIONS: community activity and yard work  PERSONAL FACTORS: Fitness, Past/current experiences, and Time since onset of injury/illness/exacerbation are also affecting patient's functional outcome.    GOALS: Goals reviewed with patient? Yes  SHORT TERM GOALS: Target date: 04/23/2023  Patient will be I with initial HEP in order to progress with therapy. Baseline: HEP provided at eval 04/23/2023: independent Goal status: MET  2.  Patient will report right lower back pain with standing or walking </= 6/10 in order to reduce functional limitations Baseline: 8-9/10 pain 04/23/2023: 6/10  Goal status: MET  LONG TERM GOALS: Target date: 05/21/2023  Patient will be I with final HEP to maintain progress from PT. Baseline: HEP provided at eval Goal status: INITIAL  2.  Patient will report modified oswestry </= 10% (5/50) in order to indicate improvement in functional status Baseline: 24% (12/50) 04/23/2023: 20% (10/50) Goal status: ONGOING  3.  Patient will demonstrate hip strength >/= 4-/5 MMT in order to improve walking tolerance Baseline: 3-/5 MMT right hip strength Goal status: INITIAL  4.  Patient will  report pain with standing up straight or walking </= 3/10 in order to reduce functional limitations Baseline: 8-9/10 pain Goal status: INITIAL  5. Patient will demonstrate 5xSTS </= 12 seconds to indicate improvement  in strength and mobility with a reduced fall risk  Baseline: 24 seconds  Goals status: INITIAL   PLAN: PT FREQUENCY: 1-2x/week  PT DURATION: 8 weeks  PLANNED INTERVENTIONS: 97164- PT Re-evaluation, 97110-Therapeutic exercises, 97530- Therapeutic activity, 97112- Neuromuscular re-education, 97535- Self Care, 95621- Manual therapy, [phone]- Gait training, Patient/Family education, Balance training, Dry Needling, Joint mobilization, Joint manipulation, Spinal manipulation, Spinal mobilization, Cryotherapy, and Moist heat.  PLAN FOR NEXT SESSION: Review HEP and progress PRN, manual/mobs for lumbar region and initiate lumbar mobility/stretching, hip flexor/quad stretch, manual for piriformis and stretching as able, progress hip strengthening   Eloy End PT  04/25/23 4:27 PM

## 2023-04-26 ENCOUNTER — Ambulatory Visit: Payer: Medicare PPO

## 2023-05-02 ENCOUNTER — Ambulatory Visit

## 2023-05-02 DIAGNOSIS — M5459 Other low back pain: Secondary | ICD-10-CM

## 2023-05-02 DIAGNOSIS — M6281 Muscle weakness (generalized): Secondary | ICD-10-CM

## 2023-05-02 NOTE — Therapy (Signed)
 OUTPATIENT PHYSICAL THERAPY TREATMENT/DISCHARGE  PHYSICAL THERAPY DISCHARGE SUMMARY  Visits from Start of Care: 9  Current functional level related to goals / functional outcomes: See goals and objective   Remaining deficits: See goals and objective   Education / Equipment: HEP   Patient agrees to discharge. Patient goals were met. Patient is being discharged due to being pleased with the current functional level.   Patient Name: RIVALDO HINEMAN MRN: [id number] DOB:[birth year], 73 y.o., male Today's Date: 05/02/2023   END OF SESSION:  PT End of Session - 05/02/23 1528     Visit Number 9    Number of Visits 17    Date for PT Re-Evaluation 05/21/23    Authorization Type [employer] MCR    Authorization Time Period 04/02/23 - 05/19/23    Authorization - Visit Number 8    Authorization - Number of Visits 12    Progress Note Due on Visit 10    PT Start Time 1530    PT Stop Time 1609    PT Time Calculation (min) 39 min    Activity Tolerance Patient tolerated treatment well    Behavior During Therapy WFL for tasks assessed/performed                    Past Medical History:  Diagnosis Date   ANEMIA, MILD    CAD (coronary artery disease)    a. CAD,  b. s/p Cypher DES to the ramus and Promus DES x 2 to the RCA 08/2007 (minimal disease in LAD and CFX at that time), c. Myoview 8/10: EF 63%, inf thinning, small prior ant infarct, no ischemia;  d. s/p CABG 3/14 (L-LAD, RIMA-OM1)   DEGENERATIVE JOINT DISEASE, RIGHT HIP    DIVERTICULOSIS, COLON    ERECTILE DYSFUNCTION    GERD    Heart murmur    HIATAL HERNIA    HTN (hypertension)    Hyperlipidemia    statin intolerant   HYPERPLASIA PROSTATE UNS W/O UR OBST & OTH LUTS    LUMBAR RADICULOPATHY, RIGHT    Other abnormal glucose    PAD (peripheral artery disease) (HCC)    pre CABG 04/2012 => ABIs: Right 0.92, left 0.61 // ABI 09/02/14: R 0.93; L 0.82   SPINAL STENOSIS, LUMBAR    Past Surgical History:  Procedure Laterality Date    COLONOSCOPY  02/07/2000   negative   COLONOSCOPY  08/2017   TA, Dr Myrtie Neither   CORONARY ARTERY BYPASS GRAFT N/A 04/22/2012   Procedure: CORONARY ARTERY BYPASS GRAFTING (CABG);  Surgeon: Loreli Slot, MD;  Location: [facility] OR;  Service: Open Heart Surgery;  Laterality: N/A;  CABG X 2, BIMA, POSSIBLE EVH   CORONARY STENT PLACEMENT  02/07/2008    X 2   TEE WITHOUT CARDIOVERSION N/A 04/22/2012   Procedure: TRANSESOPHAGEAL ECHOCARDIOGRAM (TEE);  Surgeon: Loreli Slot, MD;  Location: [facility] OR;  Service: Open Heart Surgery;  Laterality: N/A;   TOTAL HIP ARTHROPLASTY Right 02/07/2007   UPPER GASTROINTESTINAL ENDOSCOPY     Patient Active Problem List   Diagnosis Date Noted   History of stroke 10/24/2022   CVA (cerebral vascular accident) (HCC) 10/12/2022   Vertebral artery dissection (HCC) 10/11/2022   Acute ischemic stroke (HCC) 10/10/2022   Hypokalemia 10/10/2022   Low magnesium levels 04/01/2022   Vitamin D deficiency 03/27/2021   Statin intolerance 03/24/2021   Vertigo 08/31/2020   OAB (overactive bladder) 05/03/2020   Statin myopathy 05/06/2019   Low back pain 04/22/2019   Pain  in joint of right hip 12/12/2017   Elevated PSA 03/01/2017   CAD (coronary artery disease)    HTN (hypertension)    Hyperlipidemia    Impacted cerumen of both ears 11/24/2015   Encounter for well adult exam with abnormal findings 03/29/2015   Allergic rhinitis 03/23/2015   Solar urticaria 07/30/2014   Primary localized osteoarthrosis, lower leg 07/15/2013   Acute medial meniscal tear 07/15/2013   Other abnormal glucose 09/06/2012   Urgency of urination 09/06/2012   PAD (peripheral artery disease) (HCC) 06/28/2011   ANEMIA, MILD 02/02/2010   Urinary frequency 02/02/2010   SPINAL STENOSIS, LUMBAR 08/05/2008   LUMBAR RADICULOPATHY, RIGHT 07/29/2008   Diaphragmatic hernia 08/19/2007   Diverticulosis of colon 08/19/2007   GERD 05/06/2007   ERECTILE DYSFUNCTION 01/22/2007   BPH (benign prostatic  hyperplasia) 01/22/2007   DEGENERATIVE JOINT DISEASE, RIGHT HIP 01/22/2007    PCP: [name], MD  REFERRING PROVIDER: Erick Colace, MD  REFERRING DIAG: Spondylosis without myelopathy or radiculopathy, lumbar region  Rationale for Evaluation and Treatment: Rehabilitation  THERAPY DIAG:  Other low back pain  Muscle weakness (generalized)  ONSET DATE: Chronic, 2 years   SUBJECTIVE:            SUBJECTIVE STATEMENT: Pt presents to PT with continued back stiffness but otherwise is doing well. Has been compliant with HEP.   EVAL: Patient reports right sided lower back/hip stiffness, discomfort that keeps him from straightening up. This has been going for about 2 years, and forced him in to retirement. His right side gets aggravated when he tries to stand up straight or when he is walking for a period of time it will start to grab him. It does seem like when he lets it hurt and he pushes through with the walking then it will do better. He reports a hip replacement on the right side that is about 52-54 years old. He had a couple injections that didn't help with his pain. Patient reports having a CVA which affected his balance, and he does still have problems with that.  PERTINENT HISTORY:  See PMH above  PAIN:  Are you having pain? Yes:  NPRS scale: 4/10  At worst 6/10 Pain location: Right lower back / hip Pain description: Sore, stabbing Aggravating factors: Standing up straight, walking Relieving factors: Rest, positional change  PRECAUTIONS: None  RED FLAGS: None   WEIGHT BEARING RESTRICTIONS: No  FALLS:  Has patient fallen in last 6 months? No  LIVING ENVIRONMENT: Lives with: lives with their spouse Lives in: House/apartment Stairs: No  OCCUPATION: Retired  PLOF: Independent  PATIENT GOALS: Improve ability to stand up straight and walking tolerance   OBJECTIVE:  Note: Objective measures were completed at Evaluation unless otherwise noted. PATIENT  SURVEYS:  Modified Oswestry 24% (12/50)   04/23/2023: 20% (10/50) 05/02/2023: 20% (10/50)  SENSATION: WFL  MUSCLE LENGTH: Generalized muscle tightness of bilateral hips, hamstrings, quads, and hip flexors  POSTURE:   Trunk flexion in standing  PALPATION: Tender to palpation right lumbar paraspinals, glute med and max, piriformis region  Hypomobility with lumbar CPAs   LUMBAR ROM:   AROM eval  Flexion 50%  Extension < 25%  Right lateral flexion 25%  Left lateral flexion 50%  Right rotation 50%  Left rotation 50%   (Blank rows = not tested)  LOWER EXTREMITY ROM:      Patient demonstrates limitation with bilateral hip PROM in all directions  LOWER EXTREMITY MMT:    MMT Right eval  Left eval Rt / Lt 04/09/2023 Right 04/16/2023 Right/Left 05/02/23  Hip flexion 4- 4     Hip extension 3- 3  3- 4  Hip abduction 3- 3 3- / 3 3- 4  Hip adduction       Hip internal rotation       Hip external rotation       Knee flexion 5 5     Knee extension 5 5     Ankle dorsiflexion       Ankle plantarflexion       Ankle inversion       Ankle eversion        (Blank rows = not tested)  LUMBAR SPECIAL TESTS:  Not assessed  FUNCTIONAL TESTS:  5xSTS: 24 seconds 05/02/2023: 15 seconds  GAIT: Assistive device utilized: None Level of assistance: Complete Independence Comments: Trunk flexion, antalgic gait on right   TODAY'S TREATMENT: [facility] Adult PT Treatment:                                                DATE: 05/02/2023 Therapeutic Exercise:  NuStep L5 x 5 min with UE/LE to improve endurance and workload capacity Modified thomas stretch 2x60" Bridge with GTB 2x15 S/L clamshell 2x15 GTB Seated hamstring stretch 2x30" each Lateral walk RTB x 3 laps at counter Standing hip ext 2x10 RTB Therapeutic Activity:  Assessment of tests/measures, goals, and outcomes for discharge  PATIENT EDUCATION:  Education details: HEP Person educated: Patient Education method: Explanation,  Demonstration, Tactile cues, Verbal cues Education comprehension: verbalized understanding, returned demonstration, verbal cues required, tactile cues required, and needs further education  HOME EXERCISE PROGRAM: Access Code: [facility] URL: [email or web address] Date: 05/02/2023 Prepared by: Edwinna Areola  Exercises - Modified Maisie Fus Stretch  - 1 x daily - 3 reps - 60 seconds hold - Seated Hamstring Stretch  - 1 x daily - 2 reps - 30 sec hold - Supine Bridge with Resistance Band  - 3-4 x weekly - 3 sets - 15 reps - green band hold - Clam with Resistance  - 3-4 x weekly - 3 sets - 15 reps - green band hold - Side Stepping with Resistance at Ankles and Counter Support  - 3-4 x weekly - 3 reps - red band hold - Standing Hip Extension with Resistance at Ankles and Counter Support  - 3-4 x weekly - 3 sets - 10 reps - red band hold   ASSESSMENT: CLINICAL IMPRESSION: Pt was able to complete prescribed exercises with no adverse effect and demonstrated knowledge of HEP. Over the course of PT treatment he has performed well noting decrease in back pain to no greater than 5/10. Pt ODI score shows slight improvement compared to initial evaluation but he has seemed to plateau with therapy. He should continue to improve with HEP compliance and is being discharged from skilled therapy at this time.   EVAL: Patient is a 73 y.o. male who was seen today for physical therapy evaluation and treatment for chronic right lower back / hip pain. He currently demonstrates limitations in both his lumbar and hip motion, gross strength deficits of his hips, generalized muscle tightness, and right sided pain with upright posture and walking this is impacting his functional ability.   OBJECTIVE IMPAIRMENTS: Abnormal gait, decreased activity tolerance, decreased balance, decreased ROM, decreased strength, impaired flexibility, postural dysfunction, and pain.  ACTIVITY LIMITATIONS: carrying, lifting,  standing, and locomotion level  PARTICIPATION LIMITATIONS: community activity and yard work  PERSONAL FACTORS: Fitness, Past/current experiences, and Time since onset of injury/illness/exacerbation are also affecting patient's functional outcome.    GOALS: Goals reviewed with patient? Yes  SHORT TERM GOALS: Target date: 04/23/2023  Patient will be I with initial HEP in order to progress with therapy. Baseline: HEP provided at eval 04/23/2023: independent Goal status: MET  2.  Patient will report right lower back pain with standing or walking </= 6/10 in order to reduce functional limitations Baseline: 8-9/10 pain 04/23/2023: 6/10  Goal status: MET  LONG TERM GOALS: Target date: 05/21/2023  Patient will be I with final HEP to maintain progress from PT. Baseline: HEP provided at eval Goal status: MET  2.  Patient will report modified oswestry </= 10% (5/50) in order to indicate improvement in functional status Baseline: 24% (12/50) 04/23/2023: 20% (10/50) 05/02/2023: 20% (10/50) Goal status: PARTIALLY MET  3.  Patient will demonstrate hip strength >/= 4-/5 MMT in order to improve walking tolerance Baseline: 3-/5 MMT right hip strength Goal status: MET  4.  Patient will report pain with standing up straight or walking </= 3/10 in order to reduce functional limitations Baseline: 8-9/10 pain 05/02/2023: 5-6/10 Goal status: PARTIALLY MET  5. Patient will demonstrate 5xSTS </= 12 seconds to indicate improvement in strength and mobility with a reduced fall risk  Baseline: 24 seconds 05/02/2023: 15 seconds  Goals status: PARTIALLY MET   PLAN: PT FREQUENCY: 1-2x/week  PT DURATION: 8 weeks  PLANNED INTERVENTIONS: 97164- PT Re-evaluation, 97110-Therapeutic exercises, 97530- Therapeutic activity, 97112- Neuromuscular re-education, 97535- Self Care, 10272- Manual therapy, [phone]- Gait training, Patient/Family education, Balance training, Dry Needling, Joint mobilization, Joint  manipulation, Spinal manipulation, Spinal mobilization, Cryotherapy, and Moist heat.  PLAN FOR NEXT SESSION: Review HEP and progress PRN, manual/mobs for lumbar region and initiate lumbar mobility/stretching, hip flexor/quad stretch, manual for piriformis and stretching as able, progress hip strengthening   Eloy End PT  05/02/23 4:21 PM

## 2023-05-08 ENCOUNTER — Encounter: Attending: Physical Medicine & Rehabilitation | Admitting: Physical Medicine & Rehabilitation

## 2023-05-08 ENCOUNTER — Encounter: Payer: Self-pay | Admitting: Physical Medicine & Rehabilitation

## 2023-05-08 VITALS — BP 166/97 | HR 84 | Ht 74.0 in | Wt 175.0 lb

## 2023-05-08 DIAGNOSIS — M1712 Unilateral primary osteoarthritis, left knee: Secondary | ICD-10-CM | POA: Insufficient documentation

## 2023-05-08 MED ORDER — HYLAN G-F 20 16 MG/2ML IX SOSY
16.0000 mg | PREFILLED_SYRINGE | Freq: Once | INTRA_ARTICULAR | Status: AC
Start: 2023-05-08 — End: 2023-05-08
  Administered 2023-05-08: 16 mg via INTRA_ARTICULAR

## 2023-05-08 NOTE — Progress Notes (Signed)
 Indication end-stage osteoarthritis of the knee with pain that limits mobility and does not respond to oral medications.  Ultrasound guidance, 15 Hz linear transducer, long axis view  Medial aspect of the knee was imaged, identified joint space, identified patella, femur, tibia. 25-gauge 1.5 inch needle was inserted under ultrasound guidance and 3 mL of 1% lidocaine were infiltrated into the skin and subcutaneous tissue. Then a 21-gauge, 2 inch needle was inserted along the same needle track Into the joint under direct ultrasound visualization. 3 cc of joint fluid were removed and discarded. 6 mL of Synvisc-1 (16mg )  were injected. Patient tolerated procedure well Post procedure instructions given

## 2023-05-08 NOTE — Patient Instructions (Signed)
 Hylan G-F 20 Injection What is this medication? HYLAN G-F 20 (HI lan G F 20) treats arthritis of the knee. It works by acting like a lubricant and cushion in the joint. This helps the joint move easily, which reduces pain. This medicine may be used for other purposes; ask your health care provider or pharmacist if you have questions. COMMON BRAND NAME(S): Synvisc, Synvisc-One What should I tell my care team before I take this medication? They need to know if you have any of these conditions: Severe knee inflammation Skin conditions or sensitivity Skin or joint infection Venous stasis An unusual or allergic reaction to hylan G-F 20, hyaluronan (sodium hyaluronate), other medications, foods, dyes, or preservatives Pregnant or trying to get pregnant Breast-feeding How should I use this medication? This medication is injected into the knee joint. It is given by your care team in a hospital or clinic setting. Talk to your care team about the use of this medication in children. Special care may be needed. Overdosage: If you think you have taken too much of this medicine contact a poison control center or emergency room at once. NOTE: This medicine is only for you. Do not share this medicine with others. What if I miss a dose? Keep appointments for follow-up doses. It is important not to miss your dose. Call your care team if you are unable to keep an appointment. What may interact with this medication? Do not take this medication with any of the following: Certain skin disinfectants, such as benzalkonium chloride Other injections for the joint, such as steroids or anesthetics This list may not describe all possible interactions. Give your health care provider a list of all the medicines, herbs, non-prescription drugs, or dietary supplements you use. Also tell them if you smoke, drink alcohol, or use illegal drugs. Some items may interact with your medicine. What should I watch for while using  this medication? Visit your care team for regular checks on your progress. Tell your care team if your symptoms do not start to get better or if they get worse. Your condition will be monitored carefully while you are receiving this medication. Most people get pain relief for up to 6 months after treatment. Avoid strenuous activities (high-impact sports, jogging) or major weight-bearing activities for 48 hours after the injection. What side effects may I notice from receiving this medication? Side effects that you should report to your care team as soon as possible: Allergic reactions--skin rash, itching, hives, swelling of the face, lips, tongue, or throat Painful swelling, warmth, or redness of the skin, blisters or sores Severe pain, redness, warmth, or swelling in joint where injected Side effects that usually do not require medical attention (report to your care team if they continue or are bothersome): Joint pain Pain, redness, or irritation at injection site Swelling in the joint where injected This list may not describe all possible side effects. Call your doctor for medical advice about side effects. You may report side effects to FDA at 1-800-FDA-1088. Where should I keep my medication? This medication is given in a hospital or clinic. It will not be stored at home. NOTE: This sheet is a summary. It may not cover all possible information. If you have questions about this medicine, talk to your doctor, pharmacist, or health care provider.  2024 Elsevier/Gold Standard (2021-05-02 00:00:00)

## 2023-06-07 ENCOUNTER — Telehealth: Payer: Self-pay | Admitting: Cardiovascular Disease

## 2023-06-07 ENCOUNTER — Other Ambulatory Visit: Payer: Self-pay | Admitting: Urology

## 2023-06-07 NOTE — Telephone Encounter (Signed)
 Patient has been scheduled for pre op clearance.

## 2023-06-07 NOTE — Telephone Encounter (Signed)
   Pre-operative Risk Assessment    Patient Name: Eric Dickerson  DOB: Jan 21, 1951 MRN: [id number]   Date of last office visit: 06/05/22 Date of next office visit: Not yet scheduled   Request for Surgical Clearance    Procedure:   Meatotomy  Date of Surgery:  Clearance 08/13/23                                Surgeon:  Dr. Leila Punt Surgeon's Group or Practice Name:  [facility] Phone number:  [phone]  Fax number:  [phone]   Type of Clearance Requested:   - Medical  - Pharmacy:  Hold Clopidogrel  (Plavix )     Type of Anesthesia:  General    Additional requests/questions:   Caller Sallyanne Creamer) stated they will need patient's Plavix  stopped for 5 days  Signed, Jasmin B Wilson   06/07/2023, 10:19 AM

## 2023-06-07 NOTE — Telephone Encounter (Signed)
   Name: Eric Dickerson  DOB: 08-23-1950  MRN: [id number]  Primary Cardiologist: Janelle Mediate, MD Last OV 06/05/22   Chart reviewed as part of pre-operative protocol coverage. Because of Constandinos Deeds Armitage's past medical history and time since last visit, he will require a follow-up in-office visit in order to better assess preoperative cardiovascular risk.  Pre-op covering staff: - Please schedule appointment and call patient to inform them. If patient already had an upcoming appointment within acceptable timeframe, please add "pre-op clearance" to the appointment notes so provider is aware. - Please contact requesting surgeon's office via preferred method (i.e, phone, fax) to inform them of need for appointment prior to surgery.  Corrado Hymon D Loriana Samad, NP  06/07/2023, 1:21 PM

## 2023-07-10 NOTE — Telephone Encounter (Signed)
 Office states patient has cancel his surgery. Clearance is no longer needed for the patient. Please advise

## 2023-07-10 NOTE — Telephone Encounter (Signed)
 Spoke with pt regarding pre op clearance needed for 08/13/23. Pt stated he cancelled his procedure and requested appt with Renelda Carry scheduled on 07/30/23 be cancelled. Pt was advise his appt will be cancelled and to call our office if anything changes. Pt agreed.

## 2023-07-30 ENCOUNTER — Ambulatory Visit: Admitting: Nurse Practitioner

## 2023-07-30 ENCOUNTER — Ambulatory Visit: Payer: Medicare PPO | Admitting: Adult Health

## 2023-07-30 ENCOUNTER — Encounter: Payer: Self-pay | Admitting: Adult Health

## 2023-07-30 NOTE — Progress Notes (Deleted)
 [facility] [address] [place]. [place] [address] [phone]       OFFICE FOLLOW-UP NOTE  Eric Dickerson Date of Birth:  1950/05/29 Medical Record Number:  [id number]   Primary neurologist: Dr. Rosemarie Reason for visit: Stroke follow-up  No chief complaint on file.    HPI:   Update 07/30/2023 JM: Patient returns for 72-month follow-up visit.   Reports compliance on Plavix  without side effects.  No longer taking Crestor , did not complete lipid panel as ordered at prior visit.  Has not had recent follow-up with PCP, prior visit 10/2022.        Initial visit 01/15/2023 Dr. Rosemarie: Eric Dickerson is a 73 year old African-American male seen today for initial office follow-up visit following hospital consultation for stroke in September 2024.  History is obtained from the patient and review of electronic medical records.  I have personally reviewed pertinent medical and hospital records, imaging films in PACS and hospital workup.  Patient has past medical history of coronary artery disease, hypertension, hyperlipidemia, peripheral arterial disease.  He presented on 10/10/2022 with sudden onset of feeling of dizziness and room spinning sensation he started while at work.  He was evaluated in the emergency room for vertigo and MRI scan was done which showed left cerebellar infarcts and CT angiogram showed left vertebral artery occlusion from a plaque versus dissection at V4 junction.  There is also right P2, right M1, bilateral carotid siphon and right vertebral origin stenosis noted.  2D echo showed ejection fraction of 60 to 65% without cardiac source of embolism.  LDL cholesterol was 152 mg percent.  Hemoglobin A1c was 5.7.  Urine drug screen was negative.  Patient was started on dual antiplatelet therapy aspirin  and Plavix  for 3 months followed by aspirin  alone.  Patient was seen by physical Occupational Therapy and transferred to inpatient rehab where he did well.  He has been  discharged home and is doing well now able to ambulate independently without assistance.  He is tolerating aspirin  and Plavix  well but states that aspirin  causes stomach irritation and he does not take it every day.  He is tolerating Crestor  20 mg well without side effects.  He was considered for Leqvio but patient refused.  He says he is almost back to baseline.  He has no complaints today.  ROS:   14 system review of systems is positive for dizziness, gait difficulty, bruising all other systems negative  PMH:  Past Medical History:  Diagnosis Date   ANEMIA, MILD    CAD (coronary artery disease)    a. CAD,  b. s/p Cypher DES to the ramus and Promus DES x 2 to the RCA 08/2007 (minimal disease in LAD and CFX at that time), c. Myoview 8/10: EF 63%, inf thinning, small prior ant infarct, no ischemia;  d. s/p CABG 3/14 (L-LAD, RIMA-OM1)   DEGENERATIVE JOINT DISEASE, RIGHT HIP    DIVERTICULOSIS, COLON    ERECTILE DYSFUNCTION    GERD    Heart murmur    HIATAL HERNIA    HTN (hypertension)    Hyperlipidemia    statin intolerant   HYPERPLASIA PROSTATE UNS W/O UR OBST & OTH LUTS    LUMBAR RADICULOPATHY, RIGHT    Other abnormal glucose    PAD (peripheral artery disease) (HCC)    pre CABG 04/2012 => ABIs: Right 0.92, left 0.61 // ABI 09/02/14: R 0.93; L 0.82   SPINAL STENOSIS, LUMBAR     Social History:  Social History  Socioeconomic History   Marital status: Married    Spouse name: Johnston   Number of children: 4   Years of education: 12   Highest education level: Not on file  Occupational History   Occupation: Facilities Management  Tobacco Use   Smoking status: Former    Current packs/day: 0.00    Average packs/day: 1.5 packs/day for 12.0 years (18.0 ttl pk-yrs)    Types: Cigarettes    Start date: 02/06/1973    Quit date: 02/06/1985    Years since quitting: 38.5   Smokeless tobacco: Never   Tobacco comments:    smoked 1967-1987, up to 1.5 ppd  Vaping Use   Vaping status: Never Used   Substance and Sexual Activity   Alcohol use: Never   Drug use: Never   Sexual activity: Not on file  Other Topics Concern   Not on file  Social History Narrative   Denies abuse and feels safe at home. Lives with wife.   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    Social Drivers of [employer]: Low Risk  (01/01/2023)   Overall Financial Resource Strain (CARDIA)    Difficulty of Paying Living Expenses: Not very hard  Food Insecurity: No Food Insecurity (01/01/2023)   Hunger Vital Sign    Worried About Running Out of Food in the Last Year: Never true    Ran Out of Food in the Last Year: Never true  Transportation Needs: No Transportation Needs (01/01/2023)   PRAPARE - Administrator, Civil Service (Medical): No    Lack of Transportation (Non-Medical): No  Physical Activity: Inactive (01/01/2023)   Exercise Vital Sign    Days of Exercise per Week: 0 days    Minutes of Exercise per Session: 0 min  Stress: No Stress Concern Present (01/01/2023)   [employer] of Occupational Health - Occupational Stress Questionnaire    Feeling of Stress : Not at all  Social Connections: Moderately Isolated (01/01/2023)   Social Connection and Isolation Panel    Frequency of Communication with Friends and Family: Twice a week    Frequency of Social Gatherings with Friends and Family: Twice a week    Attends Religious Services: Never    Database administrator or Organizations: No    Attends Banker Meetings: Never    Marital Status: Married  Catering manager Violence: Not At Risk (01/01/2023)   Humiliation, Afraid, Rape, and Kick questionnaire    Fear of Current or Ex-Partner: No    Emotionally Abused: No    Physically Abused: No    Sexually Abused: No    Medications:   Current Outpatient Medications on File Prior to Visit  Medication Sig Dispense Refill   amLODipine  (NORVASC ) 5 MG tablet Take 1 tablet (5 mg total) by mouth daily. 90 tablet 3    clopidogrel  (PLAVIX ) 75 MG tablet Take 1 tablet (75 mg total) by mouth daily. 90 tablet 3   meclizine  (ANTIVERT ) 12.5 MG tablet Take 1 tablet (12.5 mg total) by mouth 3 (three) times daily as needed for dizziness. (Patient taking differently: Take 12.5 mg by mouth as needed for dizziness.) 40 tablet 1   Multiple Vitamins-Minerals (CENTRUM SILVER 50+MEN) TABS Take 1 tablet by mouth daily.     nitroGLYCERIN  (NITROSTAT ) 0.4 MG SL tablet Take 1 tab under your tongue for chest pain  If no relief of pain may repeat NTG, one tab every 5 minutes up to 3 tablets total over 15 minutes. 25  tablet 3   rosuvastatin  (CRESTOR ) 20 MG tablet Take 1 tablet (20 mg total) by mouth daily. (Patient not taking: Reported on 01/15/2023) 90 tablet 3   tamsulosin  (FLOMAX ) 0.4 MG CAPS capsule Take 0.4 mg by mouth at bedtime. (Patient not taking: Reported on 01/15/2023)     No current facility-administered medications on file prior to visit.    Allergies:   Allergies  Allergen Reactions   Accupril [Quinapril Hcl] Cough   Ace Inhibitors Other (See Comments)    ACE inhibitors are contraindicated because of a history of rash with Angiotensin receptor blocker.   Calan  [Verapamil ] Other (See Comments)    Sun sensitivity   Crestor  [Rosuvastatin ] Other (See Comments)    Myalgias Cramps   Glucosamine Other (See Comments)    Unknown reaction   Lipitor [Atorvastatin] Other (See Comments)    Myalgias  Cramps   Livalo [Pitavastatin] Other (See Comments)    Cramps   Pravachol  [Pravastatin ] Other (See Comments)    Myalgias Cramps   Statins Other (See Comments)    Myalgias, cramping with: Crestor , Lipitor, Livalo, Pravachol .   Zetia  [Ezetimibe ] Other (See Comments)    Myalgias Cramps  Pt ok on 3x weekly dose   Benicar [Olmesartan] Rash    Physical Exam There were no vitals filed for this visit. There is no height or weight on file to calculate BMI.  General: well developed, well nourished pleasant elderly  African-American male, seated, in no evident distress Head: head normocephalic and atraumatic.  Neck: supple with no carotid or supraclavicular bruits Cardiovascular: regular rate and rhythm, no murmurs Musculoskeletal: no deformity Skin:  no rash/petichiae Vascular:  Normal pulses all extremities  Neurologic Exam Mental Status: Awake and fully alert. Oriented to place and time. Recent and remote memory intact. Attention span, concentration and fund of knowledge appropriate. Mood and affect appropriate.  Cranial Nerves: Pupils equal, briskly reactive to light. Extraocular movements full without nystagmus. Visual fields full to confrontation. Hearing intact. Facial sensation intact. Face, tongue, palate moves normally and symmetrically.  Motor: Normal bulk and tone. Normal strength in all tested extremity muscles. Sensory.: intact to touch ,pinprick .position and vibratory sensation.  Coordination: Rapid alternating movements normal in all extremities. Finger-to-nose and heel-to-shin performed accurately bilaterally. Gait and Station: Arises from chair without difficulty. Stance is normal. Gait demonstrates normal stride length and balance . Able to heel, toe and tandem walk with slight difficulty.  Reflexes: 1+ and symmetric. Toes downgoing.       ASSESSMENT: 73 year old African-American male with left cerebellar infarct in September 2024 due to left intracranial vertebral artery occlusion.  He is doing well with practically no residual deficits.  Vascular risk factors of hyperlipidemia, coronary artery disease, peripheral arterial disease and hypertension    PLAN:  - Continue Plavix  75 mg daily for secondary stroke prevention manner/prescribed by PCP - Continue close PCP follow-up for aggressive risk factor management     I had a long d/w patient about his recent cerebellar stroke, vertebral artery occlusion, risk for recurrent stroke/TIAs, personally independently reviewed imaging  studies and stroke evaluation results and answered questions.Continue Plavix  75 mg daily alone now and discontinue aspirin  for secondary stroke prevention and maintain strict control of hypertension with blood pressure goal below 130/90, diabetes with hemoglobin A1c goal below 6.5% and lipids with LDL cholesterol goal below 70 mg/dL. I also advised the patient to eat a healthy diet with plenty of whole grains, cereals, fruits and vegetables, exercise regularly and maintain ideal body weight check  follow-up lipid profile and if not satisfactory may consider adding Zetia  or Repatha .  Followup in the future with my nurse practitioner in 6 months or call earlier if necessary.  I personally spent a total of *** minutes in the care of the patient today including {Time Based Coding:210964241}.  Harlene Bogaert, AGNP-BC  [facility] [address] [place], [place] [address]  Phone [phone] Fax [phone] Note: This document was prepared with digital dictation and possible smart phrase technology. Any transcriptional errors that result from this process are unintentional.

## 2023-08-08 ENCOUNTER — Telehealth: Payer: Self-pay | Admitting: Cardiovascular Disease

## 2023-08-08 ENCOUNTER — Other Ambulatory Visit: Payer: Self-pay | Admitting: Urology

## 2023-08-08 NOTE — Telephone Encounter (Signed)
   Pre-operative Risk Assessment    Patient Name: Eric Dickerson  DOB: February 20, 1950 MRN: [id number]      Request for Surgical Clearance    Procedure:  Cystoscopy w/ opilum urethral dilation  Date of Surgery:  Clearance 09/03/23                                 Surgeon:  Dr Carolee Socks Group or Practice Name:  [facility] Phone number:  [phone]  Fax number:  [phone]   Type of Clearance Requested:   - Pharmacy:  Hold Clopidogrel  (Plavix ) 5 days prior   Type of Anesthesia:  General    Additional requests/questions:  is ok to stay on Aspirin   Signed, April L Harrington   08/08/2023, 1:40 PM

## 2023-08-08 NOTE — Telephone Encounter (Signed)
 S/W pt and scheduled IN OFFICE Preop appt 08/24/23 with Katlyn West, NP

## 2023-08-08 NOTE — Telephone Encounter (Signed)
   Name: Eric Dickerson  DOB: 02/04/1951  MRN: [id number]  Primary Cardiologist: Maude Emmer, MD  Chart reviewed as part of pre-operative protocol coverage. Because of Eric Dickerson's past medical history and time since last visit, he will require a follow-up in-office visit in order to better assess preoperative cardiovascular risk. Last seen 06/05/2022 by Dr. Emmer.  Per office protocol, if patient is without any new symptoms or concerns at the time of their virtual visit, he may hold clopidogrel ; for 5 days prior to procedure. Please resume clopidogrel  as soon as possible postprocedure, at the discretion of the surgeon.    Pre-op covering staff: - Please schedule appointment and call patient to inform them. If patient already had an upcoming appointment within acceptable timeframe, please add pre-op clearance to the appointment notes so provider is aware. - Please contact requesting surgeon's office via preferred method (i.e, phone, fax) to inform them of need for appointment prior to surgery.    Lamarr Satterfield, NP  08/08/2023, 1:56 PM

## 2023-08-13 ENCOUNTER — Ambulatory Visit (HOSPITAL_COMMUNITY): Admit: 2023-08-13 | Admitting: Urology

## 2023-08-13 SURGERY — MEATOTOMY, URETHRA, ADULT
Anesthesia: General

## 2023-08-22 NOTE — Progress Notes (Unsigned)
 Cardiology Office Note    Date:  08/24/2023  ID:  Eric Dickerson, DOB 05/11/1950, MRN [id number] PCP:  Norleen Lynwood ORN, MD  Cardiologist:  Maude Emmer, MD  Electrophysiologist:  None   Chief Complaint: Preoperative cardiac evaluation   History of Present Illness: .    Eric Dickerson is a 73 y.o. male with visit-pertinent history of CAD s/p Cypher DES to the ramus and Promus DES x 2 to the RCA in 08/2007, minimal disease in the LAD and CFX at that time, PAD, hyperlipidemia and hypertension.  In 04/2012 patient had developed progressively worsening chest pain and underwent LHC that demonstrated three-vessel CAD.  He was referred for CABG by Dr. Kerrin on 04/22/2012 with LIMA to LAD, free RIMA to OM1.  Beta-blocker previously discontinued due to perception that it worsened back pain and made him urinate more frequently.  Patient was previously in by lipid clinic and deferred PCSK9 inhibitor or increasing Crestor  dose to achieve target LDL.  Patient was last seen in clinic on 06/05/2022 by Dr. Emmer.  Patient had remained stable from cardiac standpoint.  It was noted that his Crestor  had previously been discontinued and he was only on Zetia .  Patient was referred back to Pharm.D. lipid clinic and was encouraged to consider PCSK9 inhibitor or Nexlizet.  Patient declined referral.  On chart review patient was admitted from 9/3 to 10/12/22 with an acute ischemic stroke due to atherosclerosis versus dissection of the left V4 segment.  Noninvasive angiography showed advanced atherosclerosis versus dissection of the left vertebral artery.  Echocardiogram on 10/11/2022 indicated LVEF 60 to 65%, no RWMA, G1 DD, RV systolic function and size was normal, normal PASP, there is no evidence of mitral valve regurgitation, aortic valve sclerosis was present without any evidence of stenosis.  Patient was started on aspirin  81 mg daily and Plavix  75 mg daily for 3 months, he was continued on Plavix  75 mg daily given  history of GI problems with prolonged use of aspirin .  Leqvio was recommended however patient declined medication.  Today patient presents for preoperative cardiac evaluation for cystoscopy with opilum urethral dilation. He reports that he is doing well overall. He denies chest pain, shorntess of breath, lower extremity edema, orthoponea or pnd.  He denies any palpitations, presyncope or syncope.  Patient reports that he continues to do some minor work, reports that he has overall retired over continues to work at [employer] with small jobs.  He also reports that he regular does yard work without any difficulty.  He denies any residuals from his CVA.  ROS: .   Today he denies chest pain, shortness of breath, lower extremity edema, fatigue, palpitations, melena, hematuria, hemoptysis, diaphoresis, weakness, presyncope, syncope, orthopnea, and PND.  All other systems are reviewed and otherwise negative. Studies Reviewed: SABRA    EKG:  EKG is ordered today, personally reviewed, demonstrating  EKG Interpretation Date/Time:  Friday August 24 2023 08:44:31 EDT Ventricular Rate:  85 PR Interval:  164 QRS Duration:  118 QT Interval:  404 QTC Calculation: 480 R Axis:   39  Text Interpretation: Normal sinus rhythm Right bundle branch block Confirmed by Maxxwell Edgett [phone]) on 08/24/2023 12:04:45 PM   CV Studies: Cardiac studies reviewed are outlined and summarized above. Otherwise please see EMR for full report. Cardiac Studies & Procedures   ______________________________________________________________________________________________     ECHOCARDIOGRAM  ECHOCARDIOGRAM COMPLETE 10/11/2022  Narrative ECHOCARDIOGRAM REPORT    Patient Name:   Eric Dickerson Date of  Exam: 10/11/2022 Medical Rec #:  [id number]     Height:       74.0 in Accession #:    [id number]    Weight:       170.2 lb Date of Birth:  [birth year]      BSA:          2.029 m Patient Age:    72 years      BP:           136/79 mmHg Patient  Gender: M             HR:           66 bpm. Exam Location:  Inpatient  Procedure: 2D Echo, Color Doppler and Cardiac Doppler  Indications:    stroke  History:        Patient has no prior history of Echocardiogram examinations. Prior CABG, PAD; Risk Factors:Hypertension and Dyslipidemia.  Sonographer:    Tinnie Barefoot RDCS Referring Phys: [id number] CHRISTOPHER P DANFORD  IMPRESSIONS   1. Left ventricular ejection fraction, by estimation, is 60 to 65%. The left ventricle has normal function. The left ventricle has no regional wall motion abnormalities. Left ventricular diastolic parameters are consistent with Grade I diastolic dysfunction (impaired relaxation). 2. Right ventricular systolic function is normal. The right ventricular size is normal. There is normal pulmonary artery systolic pressure. 3. The mitral valve is normal in structure. No evidence of mitral valve regurgitation. 4. The aortic valve is tricuspid. Aortic valve regurgitation is not visualized. Aortic valve sclerosis/calcification is present, without any evidence of aortic stenosis. 5. Pulmonic valve regurgitation is moderate. 6. There is mild dilatation of the ascending aorta, measuring 40 mm. 7. The inferior vena cava is normal in size with greater than 50% respiratory variability, suggesting right atrial pressure of 3 mmHg.  FINDINGS Left Ventricle: Left ventricular ejection fraction, by estimation, is 60 to 65%. The left ventricle has normal function. The left ventricle has no regional wall motion abnormalities. The left ventricular internal cavity size was normal in size. There is no left ventricular hypertrophy. Left ventricular diastolic parameters are consistent with Grade I diastolic dysfunction (impaired relaxation).  Right Ventricle: The right ventricular size is normal. Right ventricular systolic function is normal. There is normal pulmonary artery systolic pressure. The tricuspid regurgitant velocity is  2.11 m/s, and with an assumed right atrial pressure of 3 mmHg, the estimated right ventricular systolic pressure is 20.8 mmHg.  Left Atrium: Left atrial size was normal in size.  Right Atrium: Right atrial size was normal in size.  Pericardium: There is no evidence of pericardial effusion.  Mitral Valve: The mitral valve is normal in structure. No evidence of mitral valve regurgitation.  Tricuspid Valve: Tricuspid valve regurgitation is mild.  Aortic Valve: The aortic valve is tricuspid. Aortic valve regurgitation is not visualized. Aortic valve sclerosis/calcification is present, without any evidence of aortic stenosis.  Pulmonic Valve: Pulmonic valve regurgitation is moderate.  Aorta: There is mild dilatation of the ascending aorta, measuring 40 mm.  Venous: The inferior vena cava is normal in size with greater than 50% respiratory variability, suggesting right atrial pressure of 3 mmHg.  IAS/Shunts: No atrial level shunt detected by color flow Doppler.   LEFT VENTRICLE PLAX 2D LVIDd:         4.10 cm   Diastology LVIDs:         2.90 cm   LV e' medial:    9.79 cm/s LV PW:  1.00 cm   LV E/e' medial:  7.0 LV IVS:        0.90 cm   LV e' lateral:   11.50 cm/s LVOT diam:     2.20 cm   LV E/e' lateral: 6.0 LV SV:         83 LV SV Index:   41 LVOT Area:     3.80 cm   RIGHT VENTRICLE            IVC RV Basal diam:  2.80 cm    IVC diam: 1.50 cm RV S prime:     9.90 cm/s TAPSE (M-mode): 1.7 cm  LEFT ATRIUM             Index        RIGHT ATRIUM           Index LA diam:        3.60 cm 1.77 cm/m   RA Area:     14.70 cm LA Vol (A2C):   40.4 ml 19.91 ml/m  RA Volume:   36.80 ml  18.13 ml/m LA Vol (A4C):   42.4 ml 20.89 ml/m LA Biplane Vol: 43.5 ml 21.43 ml/m AORTIC VALVE LVOT Vmax:   123.00 cm/s LVOT Vmean:  74.900 cm/s LVOT VTI:    0.218 m  AORTA Ao Root diam: 3.00 cm Ao Asc diam:  4.00 cm  MITRAL VALVE               TRICUSPID VALVE MV Area (PHT): 2.87 cm     TR Peak grad:   17.8 mmHg MV Decel Time: 264 msec    TR Vmax:        211.00 cm/s MV E velocity: 68.70 cm/s MV A velocity: 74.90 cm/s  SHUNTS MV E/A ratio:  0.92        Systemic VTI:  0.22 m Systemic Diam: 2.20 cm  Eric Dickerson Electronically signed by Eric Dickerson Signature Date/Time: 10/11/2022/1:11:38 PM    Final          ______________________________________________________________________________________________       Current Reported Medications:.    Current Meds  Medication Sig   amLODipine  (NORVASC ) 5 MG tablet Take 1 tablet (5 mg total) by mouth daily.   clopidogrel  (PLAVIX ) 75 MG tablet Take 1 tablet (75 mg total) by mouth daily.   Multiple Vitamins-Minerals (CENTRUM SILVER 50+MEN) TABS Take 1 tablet by mouth daily.   rosuvastatin  (CRESTOR ) 20 MG tablet Take 1 tablet (20 mg total) by mouth daily.   tamsulosin  (FLOMAX ) 0.4 MG CAPS capsule Take 0.4 mg by mouth at bedtime.   [DISCONTINUED] nitroGLYCERIN  (NITROSTAT ) 0.4 MG SL tablet Take 1 tab under your tongue for chest pain  If no relief of pain may repeat NTG, one tab every 5 minutes up to 3 tablets total over 15 minutes.    Physical Exam:    VS:  BP 132/80   Pulse 85   Ht 6' 2 (1.88 m)   Wt 172 lb 3.2 oz (78.1 kg)   SpO2 97%   BMI 22.11 kg/m    Wt Readings from Last 3 Encounters:  08/24/23 172 lb 3.2 oz (78.1 kg)  05/08/23 175 lb (79.4 kg)  04/10/23 180 lb (81.6 kg)    GEN: Well nourished, well developed in no acute distress NECK: No JVD; No carotid bruits CARDIAC: RRR, no murmurs, rubs, gallops RESPIRATORY:  Clear to auscultation without rales, wheezing or rhonchi  ABDOMEN: Soft, non-tender, non-distended EXTREMITIES:  No edema; No acute deformity  Asessement and Plan:.    CAD: s/p CABG in 2014. Stable with no anginal symptoms. No indication for ischemic evaluation.  Heart healthy diet and regular cardiovascular exercise encouraged.  Reviewed ED precautions.  Continue Plavix  75 mg daily, amlodipine   5 mg daily, Crestor  20 mg 3 times a week (per patient).  HLD: Last lipid profile on 10/11/2022 indicated total cholesterol 222, HDL 58, triglycerides 59 and LDL 152. Patient was started on rosuvastatin  20 mg daily, reports he is taking three times a week. Patient has declined PCSK9 inhibitors and Leqvio.  Discussed with patient that given his history his LDL goal should be below 55, patient is aware of risk of elevated cholesterol levels. Check fasting lipid profile and LFTs.   HTN: Blood pressure today 132/80.  Continue amlodipine  5 mg daily.  CVA: Patient with left cerebellar infarct, etiology secondary to left V4 atherosclerotic stenosis versus dissection.  He has been followed by neurology and continued on Plavix  75 mg daily.  Leqvio was previously recommended however patient declined medication.  He has been continued on Plavix  75 mg daily.  Dilation of the ascending aorta: Echo on 10/11/2022 indicated mild dilation of the ascending aorta measuring 40 mm.  Plan for repeat echo in 1 year.  Preoperative cardiac evaluation: Patient to undergo cystopscopy with opilum urethral dilation. Mr. Tamargo perioperative risk of a major cardiac event is 6.6% according to the Revised Cardiac Risk Index (RCRI).  His functional capacity is good at 7.34 METs according to the Duke Activity Status Index (DASI). Recommendations: According to ACC/AHA guidelines, no further cardiovascular testing needed.  The patient may proceed to surgery at acceptable risk.  Antiplatelet and/or Anticoagulation Recommendations: From a cardiac standpoint clopidogrel  (Plavix ) can be held for 5 days prior to his surgery, however patient also has history of left cerebral infarct in 10/2022, final recommendations regarding holding of Plavix  will need to come from his neurologist or PCP.   Disposition: F/u with Dr. Nishan in one year per patient request.   Signed, Orlinda Slomski D Jolena Kittle, NP

## 2023-08-23 NOTE — Progress Notes (Signed)
 COVID Vaccine received:  []  No [x]  Yes Date of any COVID positive Test in last 90 days:  PCP - Lynwood Rush, MD  Cardiologist - Maude Emmer, MD  PM&R - Prentice Compton, MD   Chest x-ray - 2014  2v  Epic EKG -  10-10-2022  Epic Stress Test -  ECHO - 10-11-2022  Epic Cardiac Cath - [facility] 04-15-2012 by Dr. Emmer CT Coronary Calcium  score:   Bowel Prep - []  No  []   Yes ______  Pacemaker / ICD device []  No []  Yes   Spinal Cord Stimulator:[]  No []  Yes       History of Sleep Apnea? []  No []  Yes   CPAP used?- []  No []  Yes    Does the patient monitor blood sugar?   []  N/A   []  No []  Yes  Patient has: []  NO Hx DM   []  Pre-DM   []  DM1  []   DM2 Last A1c was:        on      Does patient have a [employer] or Dexcom? []  No []  Yes   Fasting Blood Sugar Ranges-  Checks Blood Sugar _____ times a day  Blood Thinner / Instructions: Aspirin  Instructions:  ERAS Protocol Ordered: []  No  []  Yes PRE-SURGERY []  ENSURE  []  G2   []  No Drink Ordered  Patient is to be NPO after:   Dental hx: []  Dentures:  []  N/A      []  Bridge or Partial:                   []  Loose or Damaged teeth:   Comments:   Activity level: Able to walk up 2 flights of stairs without becoming significantly short of breath or having chest pain?  []  No   []    Yes   Anesthesia review:   Patient denies any S&S of respiratory illness or Covid - no shortness of breath, fever, cough or chest pain at PAT appointment.  Patient verbalized understanding and agreement to the Pre-Surgical Instructions that were given to them at this PAT appointment. Patient was also educated of the need to review these PAT instructions again prior to his/her surgery.I reviewed the appropriate phone numbers to call if they have any and questions or concerns.

## 2023-08-24 ENCOUNTER — Ambulatory Visit: Attending: Cardiology | Admitting: Cardiology

## 2023-08-24 ENCOUNTER — Encounter: Payer: Self-pay | Admitting: Cardiology

## 2023-08-24 ENCOUNTER — Encounter (HOSPITAL_COMMUNITY): Payer: Self-pay

## 2023-08-24 ENCOUNTER — Encounter (HOSPITAL_COMMUNITY)
Admission: RE | Admit: 2023-08-24 | Discharge: 2023-08-24 | Disposition: A | Discharge status: Home / Self Care | Source: Ambulatory Visit | Attending: Urology | Admitting: Urology

## 2023-08-24 ENCOUNTER — Other Ambulatory Visit: Payer: Self-pay

## 2023-08-24 VITALS — BP 132/80 | HR 85 | Ht 74.0 in | Wt 172.2 lb

## 2023-08-24 VITALS — BP 142/90 | HR 80 | Temp 98.6°F | Resp 16 | Ht 74.0 in | Wt 172.0 lb

## 2023-08-24 DIAGNOSIS — Z955 Presence of coronary angioplasty implant and graft: Secondary | ICD-10-CM | POA: Diagnosis not present

## 2023-08-24 DIAGNOSIS — I739 Peripheral vascular disease, unspecified: Secondary | ICD-10-CM | POA: Insufficient documentation

## 2023-08-24 DIAGNOSIS — I77810 Thoracic aortic ectasia: Secondary | ICD-10-CM

## 2023-08-24 DIAGNOSIS — Z951 Presence of aortocoronary bypass graft: Secondary | ICD-10-CM | POA: Insufficient documentation

## 2023-08-24 DIAGNOSIS — I63542 Cerebral infarction due to unspecified occlusion or stenosis of left cerebellar artery: Secondary | ICD-10-CM

## 2023-08-24 DIAGNOSIS — I10 Essential (primary) hypertension: Secondary | ICD-10-CM | POA: Diagnosis not present

## 2023-08-24 DIAGNOSIS — Z01818 Encounter for other preprocedural examination: Secondary | ICD-10-CM

## 2023-08-24 DIAGNOSIS — N35811 Other urethral stricture, male, meatal: Secondary | ICD-10-CM | POA: Diagnosis not present

## 2023-08-24 DIAGNOSIS — E7800 Pure hypercholesterolemia, unspecified: Secondary | ICD-10-CM

## 2023-08-24 DIAGNOSIS — Z01812 Encounter for preprocedural laboratory examination: Secondary | ICD-10-CM | POA: Diagnosis present

## 2023-08-24 DIAGNOSIS — Z8673 Personal history of transient ischemic attack (TIA), and cerebral infarction without residual deficits: Secondary | ICD-10-CM | POA: Insufficient documentation

## 2023-08-24 DIAGNOSIS — I2510 Atherosclerotic heart disease of native coronary artery without angina pectoris: Secondary | ICD-10-CM | POA: Diagnosis not present

## 2023-08-24 DIAGNOSIS — Z87891 Personal history of nicotine dependence: Secondary | ICD-10-CM | POA: Diagnosis not present

## 2023-08-24 HISTORY — DX: Cerebral infarction, unspecified: I63.9

## 2023-08-24 MED ORDER — NITROGLYCERIN 0.4 MG SL SUBL: SUBLINGUAL_TABLET | SUBLINGUAL | 3 refills | Status: AC

## 2023-08-24 NOTE — Patient Instructions (Signed)
 Medication Instructions:  No changes *If you need a refill on your cardiac medications before your next appointment, please call your pharmacy*  Lab Work: In the next month we are going to need a fasting lipid panel and lft's If you have labs (blood work) drawn today and your tests are completely normal, you will receive your results only by: MyChart Message (if you have MyChart) OR A paper copy in the mail If you have any lab test that is abnormal or we need to change your treatment, we will call you to review the results.  Testing/Procedures: No testing  Follow-Up: At [facility], you and your health needs are our priority.  As part of our continuing mission to provide you with exceptional heart care, our providers are all part of one team.  This team includes your primary Cardiologist (physician) and Advanced Practice Providers or APPs (Physician Assistants and Nurse Practitioners) who all work together to provide you with the care you need, when you need it.  Your next appointment:   1 year(s)  Provider:   Maude Emmer, MD    We recommend signing up for the patient portal called MyChart.  Sign up information is provided on this After Visit Summary.  MyChart is used to connect with patients for Virtual Visits (Telemedicine).  Patients are able to view lab/test results, encounter notes, upcoming appointments, etc.  Non-urgent messages can be sent to your provider as well.   To learn more about what you can do with MyChart, go to [email or web address].

## 2023-08-24 NOTE — Patient Instructions (Addendum)
 SURGICAL WAITING ROOM VISITATION Patients having surgery or a procedure may have no more than 2 support people in the waiting area - these visitors may rotate in the visitor waiting room.   If the patient needs to stay at the hospital during part of their recovery, the visitor guidelines for inpatient rooms apply.  PRE-OP VISITATION  Pre-op nurse will coordinate an appropriate time for 1 support person to accompany the patient in pre-op.  This support person may not rotate.  This visitor will be contacted when the time is appropriate for the visitor to come back in the pre-op area.  Please refer to the [facility] website for the visitor guidelines for Inpatients (after your surgery is over and you are in a regular room).  You are not required to quarantine at this time prior to your surgery. However, you must do this: Hand Hygiene often Do NOT share personal items Notify your provider if you are in close contact with someone who has COVID or you develop fever 100.4 or greater, new onset of sneezing, cough, sore throat, shortness of breath or body aches.  If you test positive for Covid or have been in contact with anyone that has tested positive in the last 10 days please notify you surgeon.    Your procedure is scheduled on:  MONDAY  September 03, 2023  Report to [facility] Main Entrance: [address] entrance where the [employer] is available.   Report to admitting at:  07:15   AM  Call this number if you have any questions or problems the morning of surgery [phone]  DO NOT EAT OR DRINK ANYTHING AFTER MIDNIGHT THE NIGHT PRIOR TO YOUR SURGERY / PROCEDURE.   FOLLOW  ANY ADDITIONAL PRE OP INSTRUCTIONS YOU RECEIVED FROM YOUR SURGEON'S OFFICE!!!   Oral Hygiene is also important to reduce your risk of infection.        Remember - BRUSH YOUR TEETH THE MORNING OF SURGERY WITH YOUR REGULAR TOOTHPASTE  Do NOT smoke after Midnight the night before surgery.  STOP TAKING all Vitamins,  Herbs and supplements 1 week before your surgery.   PLAVIX - Stop taking 5 days before your surgery-  last dose will be taken on Tuesday 08-28-2023  Take ONLY these medicines the morning of surgery with A SIP OF WATER: Amlodipine                    You may not have any metal on your body including  jewelry, and body piercing  Do not wear , lotions, powders,  cologne, or deodorant  Men may shave face and neck.  Contacts, Hearing Aids, dentures or bridgework may not be worn into surgery. DENTURES WILL BE REMOVED PRIOR TO SURGERY PLEASE DO NOT APPLY Poly grip OR ADHESIVES!!!   Patients discharged on the day of surgery will not be allowed to drive home.  Someone NEEDS to stay with you for the first 24 hours after anesthesia.  Do not bring your home medications to the hospital. The Pharmacy will dispense medications listed on your medication list to you during your admission in the Hospital.  Please read over the following fact sheets you were given: IF YOU HAVE QUESTIONS ABOUT YOUR PRE-OP INSTRUCTIONS, PLEASE CALL [phone].    - Preparing for Surgery Before surgery, you can play an important role.  Because skin is not sterile, your skin needs to be as free of germs as possible.  You can reduce the number of germs on your  skin by washing with CHG (chlorahexidine gluconate) soap before surgery.  CHG is an antiseptic cleaner which kills germs and bonds with the skin to continue killing germs even after washing. Please DO NOT use if you have an allergy to CHG or antibacterial soaps.  If your skin becomes reddened/irritated stop using the CHG and inform your nurse when you arrive at Short Stay. Do not shave (including legs and underarms) for at least 48 hours prior to the first CHG shower.  You may shave your face/neck.  Please follow these instructions carefully:  1.  Shower with CHG Soap the night before surgery and the  morning of surgery.  2.  If you choose to wash your hair,  wash your hair first as usual with your normal  shampoo.  3.  After you shampoo, rinse your hair and body thoroughly to remove the shampoo.                             4.  Use CHG as you would any other liquid soap.  You can apply chg directly to the skin and wash.  Gently with a scrungie or clean washcloth.  5.  Apply the CHG Soap to your body ONLY FROM THE NECK DOWN.   Do not use on face/ open                           Wound or open sores. Avoid contact with eyes, ears mouth and genitals (private parts).                       Wash face,  Genitals (private parts) with your normal soap.             6.  Wash thoroughly, paying special attention to the area where your  surgery  will be performed.  7.  Thoroughly rinse your body with warm water from the neck down.  8.  DO NOT shower/wash with your normal soap after using and rinsing off the CHG Soap.            9.  Pat yourself dry with a clean towel.            10.  Wear clean pajamas.            11.  Place clean sheets on your bed the night of your first shower and do not  sleep with pets.  ON THE DAY OF SURGERY : Do not apply any lotions/deodorants the morning of surgery.  Please wear clean clothes to the hospital/surgery center.    FAILURE TO FOLLOW THESE INSTRUCTIONS MAY RESULT IN THE CANCELLATION OF YOUR SURGERY  PATIENT SIGNATURE_________________________________  NURSE SIGNATURE__________________________________  ________________________________________________________________________

## 2023-08-27 NOTE — Progress Notes (Signed)
 Anesthesia Chart Review   Case: [id number] Date/Time: 09/03/23 0915   Procedure: CYSTOSCOPY, WITH URETHRAL DILATION - OPTILUME DRUG COATED BALLOON   Anesthesia type: General   Diagnosis: Other stricture of urethral meatus in male [N35.811]   Pre-op diagnosis: MEATAL STENOSIS   Location: [facility] / [facility]   Surgeons: Carolee Sherwood JONETTA DOUGLAS, MD       DISCUSSION:73 y.o. former smoker with h/o HTN, CAD s/p DES to the ramus and DES to the RCA 2009, s/p CABG 2014, Stroke, PAD, meatal stenosis scheduled for above procedure 09/03/2023 with Dr. Sherwood Carolee.   Pt last seen by cardiology 08/24/2023. Without cv sx at this visit. No indication for ischemic workup. Per OV note,  Patient to undergo cystopscopy with opilum urethral dilation. Mr. Nodarse perioperative risk of a major cardiac event is 6.6% according to the Revised Cardiac Risk Index (RCRI).  His functional capacity is good at 7.34 METs according to the Duke Activity Status Index (DASI). Recommendations: According to ACC/AHA guidelines, no further cardiovascular testing needed.  The patient may proceed to surgery at acceptable risk.  Antiplatelet and/or Anticoagulation Recommendations: From a cardiac standpoint clopidogrel  (Plavix ) can be held for 5 days prior to his surgery, however patient also has history of left cerebral infarct in 10/2022, final recommendations regarding holding of Plavix  will need to come from his neurologist or PCP   Acute ischemic stroke 10/2022. Last seen by neurology 01/15/2023. Stable at this visit.  On Plavix  alone.   Pt reports advised to hold Plavix  5 days prior to procedure.   Labs DOS, lab reports they misplaced vials from PAT visit.  VS: BP (!) 142/90 Comment: right arm sitting  Pulse 80   Temp 37 C (Oral)   Resp 16   Ht 6' 2 (1.88 m)   Wt 78 kg   SpO2 100%   BMI 22.08 kg/m   PROVIDERS: Norleen Lynwood ORN, MD is PCP    Cardiologist - Maude Emmer, MD  LABS: DOS (all labs ordered are listed, but only  abnormal results are displayed)  Labs Reviewed - No data to display   IMAGES:   EKG:   CV: Echo 10/11/2022  1. Left ventricular ejection fraction, by estimation, is 60 to 65%. The  left ventricle has normal function. The left ventricle has no regional  wall motion abnormalities. Left ventricular diastolic parameters are  consistent with Grade I diastolic  dysfunction (impaired relaxation).   2. Right ventricular systolic function is normal. The right ventricular  size is normal. There is normal pulmonary artery systolic pressure.   3. The mitral valve is normal in structure. No evidence of mitral valve  regurgitation.   4. The aortic valve is tricuspid. Aortic valve regurgitation is not  visualized. Aortic valve sclerosis/calcification is present, without any  evidence of aortic stenosis.   5. Pulmonic valve regurgitation is moderate.   6. There is mild dilatation of the ascending aorta, measuring 40 mm.   7. The inferior vena cava is normal in size with greater than 50%  respiratory variability, suggesting right atrial pressure of 3 mmHg.  Past Medical History:  Diagnosis Date   ANEMIA, MILD    CAD (coronary artery disease)    a. CAD,  b. s/p Cypher DES to the ramus and Promus DES x 2 to the RCA 08/2007 (minimal disease in LAD and CFX at that time), c. Myoview 8/10: EF 63%, inf thinning, small prior ant infarct, no ischemia;  d. s/p CABG 3/14 (L-LAD,  RIMA-OM1)   DEGENERATIVE JOINT DISEASE, RIGHT HIP    DIVERTICULOSIS, COLON    ERECTILE DYSFUNCTION    GERD    Heart murmur    HTN (hypertension)    Hyperlipidemia    statin intolerant   HYPERPLASIA PROSTATE UNS W/O UR OBST & OTH LUTS    LUMBAR RADICULOPATHY, RIGHT    Other abnormal glucose    PAD (peripheral artery disease) (HCC)    pre CABG 04/2012 => ABIs: Right 0.92, left 0.61 // ABI 09/02/14: R 0.93; L 0.82   SPINAL STENOSIS, LUMBAR    Stroke [facility])    vertebral artery dissection    Past Surgical History:  Procedure  Laterality Date   COLONOSCOPY  02/07/2000   negative   COLONOSCOPY  08/2017   TA, Dr Legrand   CORONARY ARTERY BYPASS GRAFT N/A 04/22/2012   Procedure: CORONARY ARTERY BYPASS GRAFTING (CABG);  Surgeon: Elspeth JAYSON Millers, MD;  Location: [facility] OR;  Service: Open Heart Surgery;  Laterality: N/A;  CABG X 2, BIMA, POSSIBLE EVH   CORONARY STENT PLACEMENT  02/07/2008   X 2    Left heart catheterizations   TEE WITHOUT CARDIOVERSION N/A 04/22/2012   Procedure: TRANSESOPHAGEAL ECHOCARDIOGRAM (TEE);  Surgeon: Elspeth JAYSON Millers, MD;  Location: [facility] OR;  Service: Open Heart Surgery;  Laterality: N/A;   TOTAL HIP ARTHROPLASTY Right 02/07/2007   UPPER GASTROINTESTINAL ENDOSCOPY      MEDICATIONS:  amLODipine  (NORVASC ) 5 MG tablet   clopidogrel  (PLAVIX ) 75 MG tablet   Multiple Vitamins-Minerals (CENTRUM SILVER 50+MEN) TABS   rosuvastatin  (CRESTOR ) 20 MG tablet   tamsulosin  (FLOMAX ) 0.4 MG CAPS capsule   nitroGLYCERIN  (NITROSTAT ) 0.4 MG SL tablet   No current facility-administered medications for this encounter.    Harlene Hoots Ward, PA-C [facility] [phone]

## 2023-08-27 NOTE — Progress Notes (Signed)
 I called the [facility] lab to get the CBC, BMP results from Friday's PST appt. But, these tests had been cancelled by someone on Saturday 08-25-23 at 0510. The lab tech found the vials of blood but could not run them b/c they were more than 48 hours old (according to their SOP). The CBC and BMP will need to be redrawn on the DOS 09-03-23 prior to his cystoscopy- Optilume dilation at 0930 (pt will arrive at [facility]). Tera will look to see if he can do an audit trail to see who cancelled these tests.    Shawnee Aloe, BSN, CVRN-BC   Pre-Surgical Testing Nurse [facility]  [phone]

## 2023-08-28 ENCOUNTER — Telehealth: Payer: Self-pay

## 2023-08-28 NOTE — Telephone Encounter (Signed)
 Copied from CRM [id number]. Topic: Clinical - Medical Advice >> Aug 28, 2023  8:45 AM Precious C wrote:  Reason for CRM: Shona called on behalf of the patient. She stated the patient is scheduled for surgery on the 28th and needs clearance to stop Plavix  five days prior. She mentioned the surgeon advised the patient may continue taking aspirin , but needs specific clearance for Plavix . Shona can be reached at [phone] ext. [phone] for follow-up.

## 2023-08-29 NOTE — Telephone Encounter (Signed)
 Surgical clearance form was received and placed in provider's box up front. They would like it completed ASAP because patient would need to stop plavix  today for the surgery.

## 2023-08-29 NOTE — Telephone Encounter (Signed)
 Form provided to Dr.John. Awaiting signature

## 2023-08-29 NOTE — Telephone Encounter (Signed)
 Form signed and faxed over to urologist with confirmation. Also called and left detailed voice message for Shona at [facility] informing her of fax confirmation

## 2023-08-31 NOTE — Anesthesia Preprocedure Evaluation (Addendum)
 Anesthesia Evaluation  Patient identified by MRN, date of birth, ID band Patient awake    Reviewed: Allergy & Precautions, NPO status , Patient's Chart, lab work & pertinent test results  History of Anesthesia Complications Negative for: history of anesthetic complications  Airway Mallampati: III  TM Distance: >3 FB Neck ROM: Full   Comment: Previous grade I view with MAC 3, easy mask with OPA Dental  (+) Edentulous Upper, Edentulous Lower   Pulmonary neg shortness of breath, neg sleep apnea, neg COPD, neg recent URI, former smoker   Pulmonary exam normal breath sounds clear to auscultation       Cardiovascular hypertension (amlodipine ), Pt. on medications (-) angina + CAD, + Cardiac Stents, + CABG (04/22/2012) and + Peripheral Vascular Disease  (-) Orthopnea and (-) PND + dysrhythmias (RBBB) + Valvular Problems/Murmurs (moderate PI)  Rhythm:Regular Rate:Normal  HLD  TTE 10/11/2022: IMPRESSIONS    1. Left ventricular ejection fraction, by estimation, is 60 to 65%. The  left ventricle has normal function. The left ventricle has no regional  wall motion abnormalities. Left ventricular diastolic parameters are  consistent with Grade I diastolic  dysfunction (impaired relaxation).   2. Right ventricular systolic function is normal. The right ventricular  size is normal. There is normal pulmonary artery systolic pressure.   3. The mitral valve is normal in structure. No evidence of mitral valve  regurgitation.   4. The aortic valve is tricuspid. Aortic valve regurgitation is not  visualized. Aortic valve sclerosis/calcification is present, without any  evidence of aortic stenosis.   5. Pulmonic valve regurgitation is moderate.   6. There is mild dilatation of the ascending aorta, measuring 40 mm.   7. The inferior vena cava is normal in size with greater than 50%  respiratory variability, suggesting right atrial pressure of 3 mmHg.      Neuro/Psych neg Seizures Vertigo   Neuromuscular disease (lumbar radiculopathy) CVA (vertebral artery dissection, 10/2022), No Residual Symptoms    GI/Hepatic Neg liver ROS,GERD  ,,Diverticulosis    Endo/Other  negative endocrine ROS    Renal/GU negative Renal ROS Bladder dysfunction  BPH    Musculoskeletal  (+) Arthritis ,    Abdominal   Peds  Hematology  (+) Blood dyscrasia, anemia   Anesthesia Other Findings Last Plavix : 08/29/2023  Reproductive/Obstetrics                              Anesthesia Physical Anesthesia Plan  ASA: 3  Anesthesia Plan: General   Post-op Pain Management: Tylenol  PO (pre-op)*   Induction: Intravenous  PONV Risk Score and Plan: 2 and Ondansetron , Dexamethasone  and Treatment may vary due to age or medical condition  Airway Management Planned: LMA  Additional Equipment:   Intra-op Plan:   Post-operative Plan: Extubation in OR  Informed Consent: I have reviewed the patients History and Physical, chart, labs and discussed the procedure including the risks, benefits and alternatives for the proposed anesthesia with the patient or authorized representative who has indicated his/her understanding and acceptance.     Dental advisory given  Plan Discussed with: CRNA and Anesthesiologist  Anesthesia Plan Comments: (Risks of general anesthesia discussed including, but not limited to, sore throat, hoarse voice, chipped/damaged teeth, injury to vocal cords, nausea and vomiting, allergic reactions, lung infection, heart attack, stroke, and death. All questions answered. )         Anesthesia Quick Evaluation

## 2023-09-03 ENCOUNTER — Ambulatory Visit (HOSPITAL_COMMUNITY): Payer: Self-pay | Admitting: Anesthesiology

## 2023-09-03 ENCOUNTER — Encounter (HOSPITAL_COMMUNITY)
Admission: RE | Disposition: A | Discharge status: Home / Self Care | Payer: Self-pay | Source: Ambulatory Visit | Attending: Urology

## 2023-09-03 ENCOUNTER — Encounter (HOSPITAL_COMMUNITY): Payer: Self-pay | Admitting: Urology

## 2023-09-03 ENCOUNTER — Ambulatory Visit (HOSPITAL_COMMUNITY): Payer: Self-pay | Admitting: Physician Assistant

## 2023-09-03 ENCOUNTER — Ambulatory Visit (HOSPITAL_COMMUNITY)

## 2023-09-03 ENCOUNTER — Ambulatory Visit (HOSPITAL_COMMUNITY)
Admission: RE | Admit: 2023-09-03 | Discharge: 2023-09-03 | Disposition: A | Discharge status: Home / Self Care | Source: Ambulatory Visit | Attending: Urology | Admitting: Urology

## 2023-09-03 DIAGNOSIS — L900 Lichen sclerosus et atrophicus: Secondary | ICD-10-CM | POA: Insufficient documentation

## 2023-09-03 DIAGNOSIS — M5416 Radiculopathy, lumbar region: Secondary | ICD-10-CM | POA: Insufficient documentation

## 2023-09-03 DIAGNOSIS — I4510 Unspecified right bundle-branch block: Secondary | ICD-10-CM

## 2023-09-03 DIAGNOSIS — I10 Essential (primary) hypertension: Secondary | ICD-10-CM

## 2023-09-03 DIAGNOSIS — Z955 Presence of coronary angioplasty implant and graft: Secondary | ICD-10-CM | POA: Insufficient documentation

## 2023-09-03 DIAGNOSIS — Z8249 Family history of ischemic heart disease and other diseases of the circulatory system: Secondary | ICD-10-CM | POA: Insufficient documentation

## 2023-09-03 DIAGNOSIS — Z79899 Other long term (current) drug therapy: Secondary | ICD-10-CM | POA: Insufficient documentation

## 2023-09-03 DIAGNOSIS — E785 Hyperlipidemia, unspecified: Secondary | ICD-10-CM | POA: Insufficient documentation

## 2023-09-03 DIAGNOSIS — I739 Peripheral vascular disease, unspecified: Secondary | ICD-10-CM | POA: Insufficient documentation

## 2023-09-03 DIAGNOSIS — G709 Myoneural disorder, unspecified: Secondary | ICD-10-CM | POA: Insufficient documentation

## 2023-09-03 DIAGNOSIS — I2510 Atherosclerotic heart disease of native coronary artery without angina pectoris: Secondary | ICD-10-CM | POA: Insufficient documentation

## 2023-09-03 DIAGNOSIS — N35811 Other urethral stricture, male, meatal: Secondary | ICD-10-CM | POA: Diagnosis present

## 2023-09-03 DIAGNOSIS — N35911 Unspecified urethral stricture, male, meatal: Secondary | ICD-10-CM | POA: Diagnosis not present

## 2023-09-03 DIAGNOSIS — M1990 Unspecified osteoarthritis, unspecified site: Secondary | ICD-10-CM | POA: Diagnosis not present

## 2023-09-03 DIAGNOSIS — I371 Nonrheumatic pulmonary valve insufficiency: Secondary | ICD-10-CM | POA: Diagnosis not present

## 2023-09-03 DIAGNOSIS — Z8673 Personal history of transient ischemic attack (TIA), and cerebral infarction without residual deficits: Secondary | ICD-10-CM | POA: Diagnosis not present

## 2023-09-03 DIAGNOSIS — K219 Gastro-esophageal reflux disease without esophagitis: Secondary | ICD-10-CM | POA: Diagnosis not present

## 2023-09-03 DIAGNOSIS — Z87891 Personal history of nicotine dependence: Secondary | ICD-10-CM | POA: Diagnosis not present

## 2023-09-03 DIAGNOSIS — I358 Other nonrheumatic aortic valve disorders: Secondary | ICD-10-CM | POA: Diagnosis not present

## 2023-09-03 DIAGNOSIS — R011 Cardiac murmur, unspecified: Secondary | ICD-10-CM | POA: Diagnosis not present

## 2023-09-03 HISTORY — PX: CYSTOSCOPY WITH URETHRAL DILATATION: SHX5125

## 2023-09-03 SURGERY — CYSTOSCOPY, WITH URETHRAL DILATION
Anesthesia: General | Site: Bladder

## 2023-09-03 MED ORDER — OXYCODONE HCL 5 MG/5ML PO SOLN: 5.0000 mg | Freq: Once | ORAL | Status: DC | PRN

## 2023-09-03 MED ORDER — PHENYLEPHRINE 80 MCG/ML (10ML) SYRINGE FOR IV PUSH (FOR BLOOD PRESSURE SUPPORT)
PREFILLED_SYRINGE | INTRAVENOUS | Status: DC | PRN
  Administered 2023-09-03 (×3): 80 ug via INTRAVENOUS

## 2023-09-03 MED ORDER — AMISULPRIDE (ANTIEMETIC) 5 MG/2ML IV SOLN: 10.0000 mg | Freq: Once | INTRAVENOUS | Status: DC | PRN

## 2023-09-03 MED ORDER — PROPOFOL 10 MG/ML IV BOLUS
INTRAVENOUS | Status: AC
  Filled 2023-09-03: qty 20

## 2023-09-03 MED ORDER — ONDANSETRON HCL 4 MG/2ML IJ SOLN
INTRAMUSCULAR | Status: DC | PRN
  Administered 2023-09-03: 4 mg via INTRAVENOUS

## 2023-09-03 MED ORDER — STERILE WATER FOR IRRIGATION IR SOLN
Status: DC | PRN
  Administered 2023-09-03: 10 mL

## 2023-09-03 MED ORDER — 0.9 % SODIUM CHLORIDE (POUR BTL) OPTIME
TOPICAL | Status: DC | PRN
  Administered 2023-09-03: 1000 mL

## 2023-09-03 MED ORDER — LACTATED RINGERS IV SOLN: INTRAVENOUS | Status: DC

## 2023-09-03 MED ORDER — ACETAMINOPHEN 500 MG PO TABS
1000.0000 mg | ORAL_TABLET | Freq: Once | ORAL | Status: AC
  Administered 2023-09-03: 1000 mg via ORAL
  Filled 2023-09-03: qty 2

## 2023-09-03 MED ORDER — CHLORHEXIDINE GLUCONATE 0.12 % MT SOLN
15.0000 mL | Freq: Once | OROMUCOSAL | Status: AC
  Administered 2023-09-03: 15 mL via OROMUCOSAL

## 2023-09-03 MED ORDER — SODIUM CHLORIDE 0.9 % IR SOLN
Status: DC | PRN
Start: 2023-09-03 — End: 2023-09-03
  Administered 2023-09-03: 3000 mL via INTRAVESICAL

## 2023-09-03 MED ORDER — ORAL CARE MOUTH RINSE: 15.0000 mL | Freq: Once | OROMUCOSAL | Status: AC

## 2023-09-03 MED ORDER — LIDOCAINE HCL (PF) 2 % IJ SOLN
INTRAMUSCULAR | Status: AC
  Filled 2023-09-03: qty 5

## 2023-09-03 MED ORDER — OXYCODONE HCL 5 MG PO TABS: 5.0000 mg | ORAL_TABLET | Freq: Once | ORAL | Status: DC | PRN

## 2023-09-03 MED ORDER — DEXAMETHASONE SODIUM PHOSPHATE 10 MG/ML IJ SOLN
INTRAMUSCULAR | Status: AC
Start: 2023-09-03 — End: 2023-09-03
  Filled 2023-09-03: qty 1

## 2023-09-03 MED ORDER — CEFAZOLIN SODIUM-DEXTROSE 2-4 GM/100ML-% IV SOLN
2.0000 g | INTRAVENOUS | Status: AC
  Administered 2023-09-03: 2 g via INTRAVENOUS
  Filled 2023-09-03: qty 100

## 2023-09-03 MED ORDER — ONDANSETRON HCL 4 MG/2ML IJ SOLN
INTRAMUSCULAR | Status: AC
  Filled 2023-09-03: qty 2

## 2023-09-03 MED ORDER — PROPOFOL 10 MG/ML IV BOLUS
INTRAVENOUS | Status: DC | PRN
Start: 2023-09-03 — End: 2023-09-03
  Administered 2023-09-03: 150 mg via INTRAVENOUS

## 2023-09-03 MED ORDER — LIDOCAINE HCL (CARDIAC) PF 100 MG/5ML IV SOSY
PREFILLED_SYRINGE | INTRAVENOUS | Status: DC | PRN
  Administered 2023-09-03: 100 mg via INTRAVENOUS

## 2023-09-03 MED ORDER — FENTANYL CITRATE PF 50 MCG/ML IJ SOSY: 25.0000 ug | PREFILLED_SYRINGE | INTRAMUSCULAR | Status: DC | PRN

## 2023-09-03 MED ORDER — DEXAMETHASONE SODIUM PHOSPHATE 10 MG/ML IJ SOLN
INTRAMUSCULAR | Status: DC | PRN
  Administered 2023-09-03: 4 mg via INTRAVENOUS

## 2023-09-03 SURGICAL SUPPLY — 16 items
BAG URINE DRAIN 2000ML AR STRL (UROLOGICAL SUPPLIES) ×1 IMPLANT
BALLOON NEPHROSTOMY (BALLOONS) IMPLANT
BALLOON OPTILUME DCB 30X5X75 (BALLOONS) IMPLANT
CATH FOLEY 2WAY SLVR 5CC 18FR (CATHETERS) IMPLANT
CATH URET 5FR 70CM CONE TIP (BALLOONS) IMPLANT
CATH URETL OPEN END 6FR 70 (CATHETERS) IMPLANT
CLOTH BEACON ORANGE TIMEOUT ST (SAFETY) ×1 IMPLANT
GLOVE BIO SURGEON STRL SZ7.5 (GLOVE) ×1 IMPLANT
GOWN STRL REUS W/ TWL XL LVL3 (GOWN DISPOSABLE) ×1 IMPLANT
GUIDEWIRE ANG ZIPWIRE 038X150 (WIRE) IMPLANT
GUIDEWIRE STR DUAL SENSOR (WIRE) ×1 IMPLANT
KIT TURNOVER KIT A (KITS) ×1 IMPLANT
MANIFOLD NEPTUNE II (INSTRUMENTS) IMPLANT
NS IRRIG 1000ML POUR BTL (IV SOLUTION) IMPLANT
PACK CYSTO (CUSTOM PROCEDURE TRAY) ×1 IMPLANT
WATER STERILE IRR 3000ML UROMA (IV SOLUTION) ×1 IMPLANT

## 2023-09-03 NOTE — Transfer of Care (Signed)
 Immediate Anesthesia Transfer of Care Note  Patient: Eric Dickerson  Procedure(s) Performed: CYSTOSCOPY, WITH URETHRAL DILATION (Bladder)  Patient Location: PACU  Anesthesia Type:General  Level of Consciousness: drowsy  Airway & Oxygen Therapy: Patient Spontanous Breathing and Patient connected to face mask oxygen  Post-op Assessment: Report given to RN and Post -op Vital signs reviewed and stable  Post vital signs: Reviewed and stable  Last Vitals:  Vitals Value Taken Time  BP 142/83 09/03/23 10:45  Temp    Pulse 60 09/03/23 10:48  Resp 16 09/03/23 10:48  SpO2 100 % 09/03/23 10:48  Vitals shown include unfiled device data.  Last Pain:  Vitals:   09/03/23 0802  TempSrc: Oral  PainSc:          Complications: No notable events documented.

## 2023-09-03 NOTE — Discharge Instructions (Addendum)
 Some blood in the urine or small amount of bleeding from the penis.  This should resolve.  You may restart your blood thinner in 2 days as long as there is no active bleeding noted.  Please call with the inability to urinate.

## 2023-09-03 NOTE — Anesthesia Procedure Notes (Signed)
 Procedure Name: LMA Insertion Date/Time: 09/03/2023 10:19 AM  Performed by: Gladis Honey, CRNAPre-anesthesia Checklist: Patient identified, Emergency Drugs available, Suction available and Patient being monitored Patient Re-evaluated:Patient Re-evaluated prior to induction Oxygen Delivery Method: Circle System Utilized Preoxygenation: Pre-oxygenation with 100% oxygen Induction Type: IV induction Ventilation: Mask ventilation without difficulty LMA: LMA inserted LMA Size: 4.0 Number of attempts: 1 Airway Equipment and Method: Bite block Placement Confirmation: positive ETCO2 Tube secured with: Tape Dental Injury: Teeth and Oropharynx as per pre-operative assessment

## 2023-09-03 NOTE — Op Note (Signed)
 Operative Note  Preoperative diagnosis:  1.  Meatal stenosis  Postoperative diagnosis: 1.  Meatal stenosis  Procedure(s): 1.  Cystoscopy with Optilume urethral dilation  Surgeon: Sherwood Edison, MD  Assistants: None  Anesthesia: General  Complications: None immediate  EBL: Minimal  Specimens: 1.  None  Drains/Catheters: 1.  None  Intraoperative findings: Patient had evidence of lichen sclerosis with meatal stenosis.  Wide open urethral meatus after dilation.  Remainder of anterior urethra without stricture.  He had a moderately obstructing prostate with no large median lobe.  Bladder mucosa without any tumors, masses, or stones.  Indication: 73 year old male with meatal stenosis presents for the previously mentioned operation  Description of procedure:  The patient was identified and consent was obtained.  The patient was taken to the operating room and placed in the supine position.  The patient was placed under general anesthesia.  Perioperative antibiotics were administered.  The patient was placed in dorsal lithotomy.  Patient was prepped and draped in a standard sterile fashion and a timeout was performed.  I wire was advanced into the urethral meatus.  I advanced the down the urethra.  I was then able to dilate the meatus with sounds from 12 [place] up to 28 [place].  A 21 French rigid cystoscope was advanced into the urethra and into the bladder.  Complete cystoscopy was performed with the findings noted above.  I withdrew the scope and the wire.  I advanced the Optilume balloon into the urethra and it transversed the meatus.  I inflated the balloon to a pressure of 8 where it remained inflated for approximately 5 minutes.  I deflated the balloon and withdrew it.  There was no active bleeding noted.  This concluded the operation.  Patient tolerated procedure well was stable postoperatively.  Plan: Follow-up in a month or 2 for symptom evaluation

## 2023-09-03 NOTE — Anesthesia Postprocedure Evaluation (Signed)
 Anesthesia Post Note  Patient: Eric Dickerson  Procedure(s) Performed: CYSTOSCOPY, WITH URETHRAL DILATION (Bladder)     Patient location during evaluation: PACU Anesthesia Type: General Level of consciousness: awake Pain management: pain level controlled Vital Signs Assessment: post-procedure vital signs reviewed and stable Respiratory status: spontaneous breathing, nonlabored ventilation and respiratory function stable Cardiovascular status: blood pressure returned to baseline and stable Postop Assessment: no apparent nausea or vomiting Anesthetic complications: no   No notable events documented.  Last Vitals:  Vitals:   09/03/23 1100 09/03/23 1115  BP: (!) 146/85 (!) 164/87  Pulse: 61 (!) 59  Resp: 16 18  Temp:    SpO2: 100% 100%    Last Pain:  Vitals:   09/03/23 1100  TempSrc:   PainSc: 0-No pain                 Delon Aisha Arch

## 2023-09-03 NOTE — H&P (Signed)
 CC/HPI: [name] presented in November 2015 as a referral from Dr Elsie Roses for a further assessment of a mildly elevated PSA. [name] is currently 73 years of age. He has no prior urologic history and no family history of prostate cancer. He has had some fairly longstanding voiding symptoms that have included some frequency and intermittent nocturia issues, as well as a weak stream. His IPSS score at the time of initial evaluation was 17 with a bothersome score of 4. PSA drawn in August of 2014 was 3.19. Repeat PSA in September of 2015 was 4.07. He does have a number of medical comorbidities including coronary artery disease status post stent placement followed by bypass surgery in 2015.    2019 f/u: PSA last year stable at 3.32. He does continue on tamsulosin  enjoying stable non-bothersome symptoms. Nocturia 1. No significant issues with daytime frequency or urgency. No issues beginning or maintaining urine stream. Denies dysuria or gross hematuria. No interval treatment for UTI. Denies any unexplained weight loss, or  Bone pain, or unexplained fatigue. 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 States erectile dysfunction on a priority for him at this time.    April 15, 2018: Here today for annual OV. PSA at last OV showed stability at 3.33. Voiding symptoms stable, well controlled on tamsulosin  0.4mg  qhs. In october he was seen for worsening urinary frequency by PCP. The patient had stopped his tamsulosin  due to experiencing a period of dizziness. He was found to have a cerumen impaction in his ear canals. That was thought to be the source of his dizziness. Tamsulosin  was restarted as well as doxycycline  for possible Uti. His UA was clear. A PSA was checked at the same time being elevated at 5.98 and pt was told this was likely related to a prostate infection. This has not been reassessed since then.   Today he tells me bothersome Luts improved since completing abx and resuming  tamsulosin . Now voiding at his baseline with adequate FOS, stable frequency. Nocturia 0-1. He's not had burning or painful urination, denies gross hematuria. States energy levels are good. Denies unexplained weight loss, no new bone pain. He has been seeing another provider for lumbar back pain. He tells me he was told he had arthritis in the lumbar spine. No unilateral lower back or flank pain. Denies any recent f/c, n/v.   08/15/2018  PSA slightly increased at 4.15. He continues on Flomax  for history of BPH. He is pleased with the results. No nocturia. He denies hematuria, dysuria. No complaints today.   09/19/2019  PSA increased and is now 5.21. He continues on tamsulosin  and does well with this. No voiding complaints on tamsulosin . He declines prostate biopsy.   04/01/2020  PSA is back closer to baseline and is now 3.77. He has been doing well. No voiding complaints while taking tamsulosin .   03/17/2021  Most recent PSA 3.73. He continues on tamsulosin  and has minimal complaints.   10/04/2021  Couple weeks ago, the patient had some difficulty voiding in the morning. Wondered if it was related to his meatal stenosis. He is now back to voiding his baseline. He is not ready to proceed with meatotomy unless symptoms get worse.   03/16/2022  Most recent PSA 5.77. He has had elevated PSA in the past, last in 2021 at which point it was 5.21. Declined biopsy at that time. Would like to proceed with MRI of the prostate. Has had a little bit increase in urgency.  States he is about to start taking an over-the-counter herbal supplement. He continues tamsulosin .   04/26/2022  Patient underwent an MRI of the prostate. There was significant artifact from his right hip replacement. Within limitation, there were no high risk lesions seen within the prostate. Prostate volume was 50 cc. He declines proceeding with prostate biopsy at this time.   07/07/2022:  Patient presents acutely with concerns for prostate  discomfort. He continues on tamsulosin  0.4 mg. Patient states his discomfort began 1 week ago, with cloudy and malodorous urine, increased frequency every hour, dysuria. He maintained a good force of stream, nocturia x 2. This has since improved. Today, patient denies gross hematuria, suprapubic discomfort, flank pain, fever/chills, nausea/vomiting. He does continue with minimal frequency, mostly resolved dysuria.   07/21/2022:  Patient presents for 2-week follow-up on UTI. He has completed his course of cephalexin, and continues on tamsulosin  0.4 mg. Since last seen, patient endorses good resolution of his prior symptoms. Today, he he is at baseline voiding function, noted with frequency, minimal urgency and incontinence, requiring no pad use, nocturia x 1. He has good force of stream, and has no concerns with initiating or maintaining it. Today, patient denies irritative symptoms, gross hematuria, suprapubic discomfort, flank pain, fever/chills, nausea/vomiting.   11/01/2022  Patient is not having any dysuria. Does have weak stream, intermittency, sensation of incomplete bladder emptying, nocturia x 2. He is not too bothered by this. PVR is 308 cc. He stopped his tamsulosin  because he felt like it was giving him some bladder frequency and back pain. He did have a stroke after Labor Day.   01/26/2023: Pt returns. PSA 5.98. Started on daily tadalafil at last exam in favor of tamsulosin , unfortunately insurance denied coverage he has not been taking it. Pt was also treated for UTI at last exam with Augmentin. Since then he has decided to start back on tamsulosin . He is also partially filling the tadalafil prescription taking it about every 2 to 3 days. With this regimen he has seen improvement with baseline LUTS. Frequency has decreased. Force of stream appropriate by his report, nocturia reduced also now only getting up twice at night when he was getting up more previously. He has had no interval recurrence of  any dysuria or pain with urination. Denies interval gross hematuria. IPSS today is 12, PVR stable.   04/26/2023  Most recent PSA 4.97 which is lower than prior values. He wants to continue to observe and declines any prostate biopsy. Has elevated PVR at 312 cc but low amount of urinary complaints. Mildly weak stream. Remains on tamsulosin . No interest in other treatments. He stopped tadalafil.   06/05/2023  Patient has noted an increase in meatal stenosis. Notices a weaker stream and increase in urinary frequency. Would like to proceed with dilation versus meatotomy. Is on Plavix  for history of stroke last September.   08/07/2023  Patient was planning for procedural intervention but ultimately decided against it. Now wants to revisit the procedure.     ALLERGIES: Betadine RTU SOLN Shellfish    MEDICATIONS: GoodSense Aspirin  81 MG Tablet Chewable  Plavix   Amlodipine  Besylate  Centrum Silver Tablet Oral  Ezetimibe  10 MG Tablet 1 tablet PO Daily  Rosuvastatin  Calcium      GU PSH: None     PSH Notes: CABG (CABG), Total Hip Replacement, CABG (CABG)   NON-GU PSH: CABG (coronary artery bypass grafting) - 2015, 2015 Total Hip Replacement - 2015 Visit Complexity (formerly [facility]) - 06/05/2023, 11/01/2022, 04/26/2022  GU PMH: Urinary Urgency - 06/05/2023, - 11/01/2022, - 04/26/2022, - 03/16/2022, - 10/04/2021 Weak Urinary Stream - 06/05/2023, - 04/26/2023 (Stable), - 11/01/2022, Weak urinary stream, - 2016 BPH w/LUTS - 04/26/2023, - 01/26/2023, - 11/01/2022, - 07/21/2022, - 04/26/2022, - 03/16/2022, BPH (benign prostatic hypertrophy) with urinary obstruction, - 2016 Elevated PSA - 04/26/2023, - 01/26/2023, - 11/01/2022, - 07/21/2022, - 04/26/2022, - 03/16/2022, - 2022, PSA elevation, - 2016 Encounter for Prostate Cancer screening - 04/26/2023, - 04/26/2022, - 03/16/2022, - 2023, - 2022, - 2021 Incomplete bladder emptying - 04/26/2023, - 01/26/2023, - 11/01/2022 Acute Cystitis/UTI - 07/21/2022, - 07/07/2022 BPH w/o LUTS  - 2023, - 2022, - 2020 ED due to arterial insufficiency - 2020    NON-GU PMH: Other urethral stricture, male, meatal - 06/05/2023, - 10/04/2021, - 2023 Encounter for general adult medical examination without abnormal findings, Encounter for preventive health examination - 2016 Personal history of other diseases of the circulatory system, History of cardiac disorder - 2015, History of cardiac murmur, - 2015, History of hypertension, - 2015 Personal history of other diseases of the digestive system, History of esophageal reflux - 2015 Personal history of other diseases of the musculoskeletal system and connective tissue, History of arthritis - 2015 Personal history of other endocrine, nutritional and metabolic disease, History of hypercholesterolemia - 2015 Stroke/TIA    FAMILY HISTORY: Acute Myocardial Infarction - Runs In Family Deceased - Runs In Family   SOCIAL HISTORY: Marital Status: Married Preferred Language: English; Race: Black or African American Current Smoking Status: Patient does not smoke anymore.   Tobacco Use Assessment Completed: Used Tobacco in last 30 days? Drinks 3 caffeinated drinks per day.     Notes: Former smoker, No alcohol use, Occupation, Married, Caffeine use, Number of children, Former smoker   REVIEW OF SYSTEMS:    GU Review Male:   Patient denies frequent urination, hard to postpone urination, burning/ pain with urination, get up at night to urinate, leakage of urine, stream starts and stops, trouble starting your stream, have to strain to urinate , erection problems, and penile pain.  Gastrointestinal (Upper):   Patient denies nausea, vomiting, and indigestion/ heartburn.  Gastrointestinal (Lower):   Patient denies constipation and diarrhea.  Constitutional:   Patient denies fever, night sweats, weight loss, and fatigue.  Skin:   Patient denies skin rash/ lesion and itching.  Eyes:   Patient denies blurred vision and double vision.  Ears/ Nose/ Throat:    Patient denies sore throat and sinus problems.  Hematologic/Lymphatic:   Patient denies swollen glands and easy bruising.  Cardiovascular:   Patient denies leg swelling and chest pains.  Respiratory:   Patient denies cough and shortness of breath.  Endocrine:   Patient denies excessive thirst.  Musculoskeletal:   Patient denies back pain and joint pain.  Neurological:   Patient denies headaches and dizziness.  Psychologic:   Patient denies depression and anxiety.   VITAL SIGNS: None   GU PHYSICAL EXAMINATION:    Penis: Circumcised, likely lichen sclerosus present with significant meatal stenosis   MULTI-SYSTEM PHYSICAL EXAMINATION:       Complexity of Data:  Source Of History:  Patient  Records Review:   Previous Doctor Records, Previous Patient Records   04/19/23 01/19/23 03/13/22 03/10/21 03/25/20 09/12/19 07/24/18 04/15/18  PSA  Total PSA 4.97 ng/mL 5.98 ng/mL 5.77 ng/mL 3.73 ng/mL 3.77 ng/mL 5.21 ng/mL 4.15 ng/mL 3.98 ng/mL  Free PSA       0.79 ng/mL   % Free PSA  19 % PSA     PROCEDURES:          Visit Complexity - G2211    ASSESSMENT:      ICD-10 Details  1 GU:   Weak Urinary Stream - R39.12 Chronic, Stable  2   Incomplete bladder emptying - R39.14 Chronic, Stable  3 NON-GU:   Other urethral stricture, male, meatal - N35.811 Chronic, Stable   PLAN:           Document Letter(s):  Created for Patient: Clinical Summary         Notes:   Will plan for cystoscopy with Optilume urethral dilation. Risk and benefits discussed.   CC: Dr. Norleen    Signed by Sherwood Edison, III, M.D. on 08/07/23 at 10:15 AM (EDT

## 2023-09-04 ENCOUNTER — Encounter (HOSPITAL_COMMUNITY): Payer: Self-pay | Admitting: Urology

## 2023-11-20 ENCOUNTER — Ambulatory Visit

## 2023-11-20 ENCOUNTER — Ambulatory Visit: Payer: Self-pay | Admitting: Internal Medicine

## 2023-11-20 ENCOUNTER — Encounter: Payer: Self-pay | Admitting: Internal Medicine

## 2023-11-20 ENCOUNTER — Ambulatory Visit: Admitting: Internal Medicine

## 2023-11-20 VITALS — BP 130/78 | HR 86 | Temp 98.6°F | Ht 73.0 in | Wt 173.0 lb

## 2023-11-20 VITALS — BP 130/78 | HR 86 | Ht 73.0 in | Wt 173.4 lb

## 2023-11-20 DIAGNOSIS — R790 Abnormal level of blood mineral: Secondary | ICD-10-CM

## 2023-11-20 DIAGNOSIS — I10 Essential (primary) hypertension: Secondary | ICD-10-CM | POA: Diagnosis not present

## 2023-11-20 DIAGNOSIS — R972 Elevated prostate specific antigen [PSA]: Secondary | ICD-10-CM | POA: Diagnosis not present

## 2023-11-20 DIAGNOSIS — R7309 Other abnormal glucose: Secondary | ICD-10-CM

## 2023-11-20 DIAGNOSIS — Z0000 Encounter for general adult medical examination without abnormal findings: Secondary | ICD-10-CM

## 2023-11-20 DIAGNOSIS — D649 Anemia, unspecified: Secondary | ICD-10-CM

## 2023-11-20 DIAGNOSIS — E559 Vitamin D deficiency, unspecified: Secondary | ICD-10-CM

## 2023-11-20 DIAGNOSIS — E7800 Pure hypercholesterolemia, unspecified: Secondary | ICD-10-CM

## 2023-11-20 DIAGNOSIS — E538 Deficiency of other specified B group vitamins: Secondary | ICD-10-CM

## 2023-11-20 DIAGNOSIS — Z0001 Encounter for general adult medical examination with abnormal findings: Secondary | ICD-10-CM

## 2023-11-20 LAB — BASIC METABOLIC PANEL WITH GFR
BUN: 17 mg/dL (ref 6–23)
CO2: 29 meq/L (ref 19–32)
Calcium: 9.7 mg/dL (ref 8.4–10.5)
Chloride: 102 meq/L (ref 96–112)
Creatinine, Ser: 0.97 mg/dL (ref 0.40–1.50)
GFR: 77.03 mL/min (ref >60.00)
Glucose, Bld: 90 mg/dL (ref 70–99)
Potassium: 4.1 meq/L (ref 3.5–5.1)
Sodium: 138 meq/L (ref 135–145)

## 2023-11-20 LAB — LIPID PANEL
Cholesterol: 232 mg/dL — ABNORMAL HIGH (ref 0–200)
HDL: 62.6 mg/dL (ref >39.00)
LDL Cholesterol: 156 mg/dL — ABNORMAL HIGH (ref 0–99)
NonHDL: 169.21
Total CHOL/HDL Ratio: 4
Triglycerides: 64 mg/dL (ref 0.0–149.0)
VLDL: 12.8 mg/dL (ref 0.0–40.0)

## 2023-11-20 LAB — PSA: PSA: 8.27 ng/mL — ABNORMAL HIGH (ref 0.10–4.00)

## 2023-11-20 LAB — URINALYSIS, ROUTINE W REFLEX MICROSCOPIC
Bilirubin Urine: NEGATIVE
Hgb urine dipstick: NEGATIVE
Ketones, ur: NEGATIVE
Leukocytes,Ua: NEGATIVE
Nitrite: NEGATIVE
Specific Gravity, Urine: 1.01 (ref 1.000–1.030)
Total Protein, Urine: NEGATIVE
Urine Glucose: NEGATIVE
Urobilinogen, UA: 0.2 (ref 0.0–1.0)
pH: 7 (ref 5.0–8.0)

## 2023-11-20 LAB — CBC WITH DIFFERENTIAL/PLATELET
Basophils Absolute: 0 K/uL (ref 0.0–0.1)
Basophils Relative: 0.6 % (ref 0.0–3.0)
Eosinophils Absolute: 0.1 K/uL (ref 0.0–0.7)
Eosinophils Relative: 0.9 % (ref 0.0–5.0)
HCT: 39.8 % (ref 39.0–52.0)
Hemoglobin: 12.7 g/dL — ABNORMAL LOW (ref 13.0–17.0)
Lymphocytes Relative: 20.1 % (ref 12.0–46.0)
Lymphs Abs: 1.3 K/uL (ref 0.7–4.0)
MCHC: 32 g/dL (ref 30.0–36.0)
MCV: 85.3 fl (ref 78.0–100.0)
Monocytes Absolute: 0.5 K/uL (ref 0.1–1.0)
Monocytes Relative: 8.8 % (ref 3.0–12.0)
Neutro Abs: 4.3 K/uL (ref 1.4–7.7)
Neutrophils Relative %: 69.6 % (ref 43.0–77.0)
Platelets: 254 K/uL (ref 150.0–400.0)
RBC: 4.67 Mil/uL (ref 4.22–5.81)
RDW: 14.9 % (ref 11.5–15.5)
WBC: 6.2 K/uL (ref 4.0–10.5)

## 2023-11-20 LAB — HEPATIC FUNCTION PANEL
ALT: 13 U/L (ref 0–53)
AST: 16 U/L (ref 0–37)
Albumin: 4.3 g/dL (ref 3.5–5.2)
Alkaline Phosphatase: 87 U/L (ref 39–117)
Bilirubin, Direct: 0.1 mg/dL (ref 0.0–0.3)
Total Bilirubin: 0.3 mg/dL (ref 0.2–1.2)
Total Protein: 7.5 g/dL (ref 6.0–8.3)

## 2023-11-20 LAB — MAGNESIUM: Magnesium: 2 mg/dL (ref 1.5–2.5)

## 2023-11-20 LAB — VITAMIN B12: Vitamin B-12: 490 pg/mL (ref 211–911)

## 2023-11-20 LAB — TSH: TSH: 2.62 u[IU]/mL (ref 0.35–5.50)

## 2023-11-20 LAB — HEMOGLOBIN A1C: Hgb A1c MFr Bld: 6.1 % (ref 4.6–6.5)

## 2023-11-20 MED ORDER — TAMSULOSIN HCL 0.4 MG PO CAPS: 0.4000 mg | ORAL_CAPSULE | Freq: Every day | ORAL | 3 refills | Status: AC

## 2023-11-20 MED ORDER — AMLODIPINE BESYLATE 5 MG PO TABS: 5.0000 mg | ORAL_TABLET | Freq: Every day | ORAL | 3 refills | Status: AC

## 2023-11-20 NOTE — Assessment & Plan Note (Signed)
 BP Readings from Last 3 Encounters:  11/20/23 130/78  11/20/23 130/78  09/03/23 (!) 147/89   Stable, pt to continue medical treatment norvasc  5 mg qd

## 2023-11-20 NOTE — Assessment & Plan Note (Signed)
Also for f/u iron lab,  to f/u any worsening symptoms or concerns

## 2023-11-20 NOTE — Progress Notes (Signed)
 Subjective:   Eric Dickerson is a 73 y.o. who presents for a Medicare Wellness preventive visit.  As a reminder, Annual Wellness Visits don't include a physical exam, and some assessments may be limited, especially if this visit is performed virtually. We may recommend an in-person follow-up visit with your provider if needed.  Visit Complete: In person  Persons Participating in Visit: Patient.  AWV Questionnaire: No: Patient Medicare AWV questionnaire was not completed prior to this visit.  Cardiac Risk Factors include: advanced age (>17men, >73 women);dyslipidemia;hypertension;male gender     Objective:    Today's Vitals   11/20/23 1240  BP: 130/78  Pulse: 86  SpO2: 98%  Weight: 173 lb 6.4 oz (78.7 kg)  Height: 6' 1 (1.854 m)   Body mass index is 22.88 kg/m.     08/24/2023    2:39 PM 01/01/2023    2:41 PM 11/07/2022    1:17 PM 10/12/2022    5:06 PM 10/10/2022   11:34 AM 08/07/2017   12:52 PM 04/24/2012    8:00 AM  Advanced Directives  Does Patient Have a Medical Advance Directive? No No No No No No  Patient does not have advance directive   Would patient like information on creating a medical advance directive? No - Patient declined   No - Patient declined No - Patient declined    Pre-existing out of facility DNR order (yellow form or pink MOST form)       No      Data saved with a previous flowsheet row definition    Current Medications (verified) Outpatient Encounter Medications as of 11/20/2023  Medication Sig   amLODipine  (NORVASC ) 5 MG tablet Take 1 tablet (5 mg total) by mouth daily.   Multiple Vitamins-Minerals (CENTRUM SILVER 50+MEN) TABS Take 1 tablet by mouth daily.   nitroGLYCERIN  (NITROSTAT ) 0.4 MG SL tablet Take 1 tab under your tongue for chest pain  If no relief of pain may repeat NTG, one tab every 5 minutes up to 3 tablets total over 15 minutes.   rosuvastatin  (CRESTOR ) 20 MG tablet Take 1 tablet (20 mg total) by mouth daily.   tamsulosin  (FLOMAX ) 0.4  MG CAPS capsule Take 0.4 mg by mouth at bedtime.   No facility-administered encounter medications on file as of 11/20/2023.    Allergies (verified) Accupril [quinapril hcl], Ace inhibitors, Calan  [verapamil ], Crestor  [rosuvastatin ], Glucosamine, Lipitor [atorvastatin], Livalo [pitavastatin], Pravachol  [pravastatin ], Statins, Zetia  [ezetimibe ], and Benicar [olmesartan]   History: Past Medical History:  Diagnosis Date   ANEMIA, MILD    CAD (coronary artery disease)    a. CAD,  b. s/p Cypher DES to the ramus and Promus DES x 2 to the RCA 08/2007 (minimal disease in LAD and CFX at that time), c. Myoview 8/10: EF 63%, inf thinning, small prior ant infarct, no ischemia;  d. s/p CABG 3/14 (L-LAD, RIMA-OM1)   DEGENERATIVE JOINT DISEASE, RIGHT HIP    DIVERTICULOSIS, COLON    ERECTILE DYSFUNCTION    GERD    Heart murmur    HTN (hypertension)    Hyperlipidemia    statin intolerant   HYPERPLASIA PROSTATE UNS W/O UR OBST & OTH LUTS    LUMBAR RADICULOPATHY, RIGHT    Other abnormal glucose    PAD (peripheral artery disease)    pre CABG 04/2012 => ABIs: Right 0.92, left 0.61 // ABI 09/02/14: R 0.93; L 0.82   SPINAL STENOSIS, LUMBAR    Stroke (HCC)    vertebral artery dissection   Past  Surgical History:  Procedure Laterality Date   COLONOSCOPY  02/07/2000   negative   COLONOSCOPY  08/2017   TA, Dr Legrand   CORONARY ARTERY BYPASS GRAFT N/A 04/22/2012   Procedure: CORONARY ARTERY BYPASS GRAFTING (CABG);  Surgeon: Elspeth JAYSON Millers, MD;  Location[facility] OR;  Service: Open Heart Surgery;  Laterality: N/A;  CABG X 2, BIMA, POSSIBLE EVH   CORONARY STENT PLACEMENT  02/07/2008   X 2    Left heart catheterizations   CYSTOSCOPY WITH URETHRAL DILATATION N/A 09/03/2023   Procedure: CYSTOSCOPY, WITH URETHRAL DILATION;  Surgeon: Carolee Sherwood JONETTA DOUGLAS, MD;  Location: [facility] ORS;  Service: Urology;  Laterality: N/A;  OPTILUME DRUG COATED BALLOON   TEE WITHOUT CARDIOVERSION N/A 04/22/2012   Procedure: TRANSESOPHAGEAL  ECHOCARDIOGRAM (TEE);  Surgeon: Elspeth JAYSON Millers, MD;  Location: [facility] OR;  Service: Open Heart Surgery;  Laterality: N/A;   TOTAL HIP ARTHROPLASTY Right 02/07/2007   UPPER GASTROINTESTINAL ENDOSCOPY     Family History  Problem Relation Age of Onset   Hyperlipidemia Mother    Hypertension Father    Heart attack Father 82       smoker   Stroke Brother        2 brothers ; 31 & 39   Hyperlipidemia Brother    Pulmonary embolism Brother    Heart attack Brother 47   Diabetes Maternal Grandmother    Cancer Neg Hx    COPD Neg Hx    Colon cancer Neg Hx    Esophageal cancer Neg Hx    Stomach cancer Neg Hx    Rectal cancer Neg Hx    Colon polyps Neg Hx    Social History   Socioeconomic History   Marital status: Married    Spouse name: Johnston   Number of children: 4   Years of education: 12   Highest education level: 10th grade  Occupational History   Occupation: Facilities Management  Tobacco Use   Smoking status: Former    Current packs/day: 0.00    Average packs/day: 1.5 packs/day for 12.0 years (18.0 ttl pk-yrs)    Types: Cigarettes    Start date: 02/06/1973    Quit date: 02/06/1985    Years since quitting: 38.8   Smokeless tobacco: Never   Tobacco comments:    smoked 1967-1987, up to 1.5 ppd  Vaping Use   Vaping status: Never Used  Substance and Sexual Activity   Alcohol use: Never   Drug use: Never   Sexual activity: Not Currently  Other Topics Concern   Not on file  Social History Narrative   Denies abuse and feels safe at home. Lives with wife.   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    Social Drivers of [employer]: Low Risk  (11/20/2023)   Overall Financial Resource Strain (CARDIA)    Difficulty of Paying Living Expenses: Not very hard  Food Insecurity: No Food Insecurity (11/20/2023)   Hunger Vital Sign    Worried About Running Out of Food in the Last Year: Never true    Ran Out of Food in the Last Year: Never true  Transportation Needs: No  Transportation Needs (11/20/2023)   PRAPARE - Administrator, Civil Service (Medical): No    Lack of Transportation (Non-Medical): No  Physical Activity: Insufficiently Active (11/20/2023)   Exercise Vital Sign    Days of Exercise per Week: 4 days    Minutes of Exercise per Session: 30 min  Stress: No Stress Concern  Present (11/20/2023)   [employer] of Occupational Health - Occupational Stress Questionnaire    Feeling of Stress: Not at all  Social Connections: Moderately Isolated (11/20/2023)   Social Connection and Isolation Panel    Frequency of Communication with Friends and Family: Three times a week    Frequency of Social Gatherings with Friends and Family: Three times a week    Attends Religious Services: Never    Active Member of Clubs or Organizations: No    Attends Banker Meetings: Never    Marital Status: Married    Tobacco Counseling Counseling given: Not Answered Tobacco comments: smoked [phone], up to 1.5 ppd    Clinical Intake:  Pre-visit preparation completed: Yes  Pain : No/denies pain     BMI - recorded: 22.88 Nutritional Status: BMI of 19-24  Normal Nutritional Risks: None Diabetes: No  Lab Results  Component Value Date   HGBA1C 5.7 (H) 10/11/2022   HGBA1C 6.0 03/28/2022   HGBA1C 6.0 03/24/2021     How often do you need to have someone help you when you read instructions, pamphlets, or other written materials from your doctor or pharmacy?: 1 - Never  Interpreter Needed?: No  Information entered by :: Verdie Saba, CMA   Activities of Daily Living     11/20/2023   12:43 PM 08/24/2023    2:41 PM  In your present state of health, do you have any difficulty performing the following activities:  Hearing? 0   Vision? 0   Difficulty concentrating or making decisions? 0   Walking or climbing stairs? 0   Dressing or bathing? 0   Doing errands, shopping? 0 0  Preparing Food and eating ? N   Using the Toilet?  N   In the past six months, have you accidently leaked urine? N   Do you have problems with loss of bowel control? N   Managing your Medications? N   Managing your Finances? N   Housekeeping or managing your Housekeeping? N     Patient Care Team: Norleen Lynwood ORN, MD as PCP - General (Internal Medicine) Delford Maude BROCKS, MD as PCP - Cardiology (Cardiology) Delford Maude BROCKS, MD as Attending Physician (Cardiology) Kerrin Elspeth BROCKS, MD as Attending Physician (Cardiothoracic Surgery) [facility]  I have updated your Care Teams any recent Medical Services you may have received from other providers in the past year.     Assessment:   This is a routine wellness examination for Lido.  Hearing/Vision screen Hearing Screening - Comments:: Denies hearing difficulties   Vision Screening - Comments:: Wears rx glasses - up to date with routine eye exams with [facility]   Goals Addressed               This Visit's Progress     Patient Stated (pt-stated)        Patient stated he plans to monitor his bp readings       Depression Screen     11/20/2023   12:44 PM 01/01/2023    2:45 PM 11/28/2022   12:54 PM 08/07/2022    3:53 PM 03/28/2022    2:07 PM 03/24/2021    2:00 PM 03/24/2021    1:41 PM  PHQ 2/9 Scores  PHQ - 2 Score 0 0 0 0 0 0 1  PHQ- 9 Score 0 0 0  0      Fall Risk     11/20/2023   12:43 PM 05/08/2023  2:50 PM 04/10/2023    2:13 PM 01/01/2023    2:41 PM 11/28/2022   12:53 PM  Fall Risk   Falls in the past year? 0 0 0 0 0  Number falls in past yr: 0   0 0  Injury with Fall? 0   0 0  Risk for fall due to : No Fall Risks   No Fall Risks   Follow up Falls evaluation completed;Falls prevention discussed   Falls prevention discussed;Falls evaluation completed     MEDICARE RISK AT HOME:  Medicare Risk at Home Any stairs in or around the home?: No If so, are there any without handrails?: No Home free of loose throw rugs in walkways, pet beds,  electrical cords, etc?: Yes Adequate lighting in your home to reduce risk of falls?: Yes Life alert?: No Use of a cane, walker or w/c?: No Grab bars in the bathroom?: Yes Shower chair or bench in shower?: Yes Elevated toilet seat or a handicapped toilet?: Yes  TIMED UP AND GO:  Was the test performed?  No  Cognitive Function: 6CIT completed        11/20/2023   12:45 PM 01/01/2023    2:31 PM  6CIT Screen  What Year? 0 points 0 points  What month? 0 points 0 points  What time? 0 points 0 points  Count back from 20 0 points 2 points  Months in reverse 0 points 4 points  Repeat phrase 2 points 0 points  Total Score 2 points 6 points    Immunizations Immunization History  Administered Date(s) Administered   PFIZER(Purple Top)SARS-COV-2 Vaccination 03/30/2019, 04/22/2019   Td 11/15/2009    Screening Tests Health Maintenance  Topic Date Due   Pneumococcal Vaccine: 50+ Years (1 of 2 - PCV) Never done   Zoster Vaccines- Shingrix (1 of 2) Never done   DTaP/Tdap/Td (2 - Tdap) 01/01/2024 (Originally 11/16/2019)   Medicare Annual Wellness (AWV)  11/19/2024   Hepatitis C Screening  Completed   Meningococcal B Vaccine  Aged Out   Influenza Vaccine  Discontinued   Colonoscopy  Discontinued   COVID-19 Vaccine  Discontinued    Health Maintenance Items Addressed:  I have recommended that this patient have a immunization for Influenza and the Shingles but he declines at this time. I have discussed the risks and benefits of this procedure with him. The patient verbalizes understanding.   Additional Screening:  Vision Screening: Recommended annual ophthalmology exams for early detection of glaucoma and other disorders of the eye. Is the patient up to date with their annual eye exam?  Yes  Who is the provider or what is the name of the office in which the patient attends annual eye exams? [facility]  Dental Screening: Recommended annual dental exams for proper oral  hygiene  Community Resource Referral / Chronic Care Management: CRR required this visit?  No   CCM required this visit?  No   Plan:    I have personally reviewed and noted the following in the patient's chart:   Medical and social history Use of alcohol, tobacco or illicit drugs  Current medications and supplements including opioid prescriptions. Patient is not currently taking opioid prescriptions. Functional ability and status Nutritional status Physical activity Advanced directives List of other physicians Hospitalizations, surgeries, and ER visits in previous 12 months Vitals Screenings to include cognitive, depression, and falls Referrals and appointments  In addition, I have reviewed and discussed with patient certain preventive protocols, quality metrics, and best  practice recommendations. A written personalized care plan for preventive services as well as general preventive health recommendations were provided to patient.   Verdie CHRISTELLA Saba, CMA   11/20/2023   After Visit Summary: (In Person-Declined) Patient declined AVS at this time.  Notes: Nothing significant to report at this time.

## 2023-11-20 NOTE — Assessment & Plan Note (Signed)
Also for f/u lab today

## 2023-11-20 NOTE — Assessment & Plan Note (Signed)
 Age and sex appropriate education and counseling updated with regular exercise and diet Referrals for preventative services - none needed Immunizations addressed - declines all immunizations Smoking counseling  - none needed Evidence for depression or other mood disorder - none significant Most recent labs reviewed. I have personally reviewed and have noted: 1) the patient's medical and social history 2) The patient's current medications and supplements 3) The patient's height, weight, and BMI have been recorded in the chart

## 2023-11-20 NOTE — Assessment & Plan Note (Signed)
Last vitamin D Lab Results  Component Value Date   VD25OH 30.66 03/28/2022   Low, to start oral replacement

## 2023-11-20 NOTE — Assessment & Plan Note (Signed)
 Lab Results  Component Value Date   HGBA1C 6.1 11/20/2023   Stable, pt to continue current medical treatment  - diet, wt control

## 2023-11-20 NOTE — Progress Notes (Signed)
 Patient ID: Eric Dickerson, male   DOB: 10-Jul-1950, 73 y.o.   MRN: [id number]         Chief Complaint:: wellness exam and elevated psa, low magnesium , hld, anemia, htn, hyperglycemia, low vit d       HPI:  Eric Dickerson is a 73 y.o. male here for wellness exam; declines immunizations all, o/w up to date                        Also Pt denies chest pain, increased sob or doe, wheezing, orthopnea, PND, increased LE swelling, palpitations, dizziness or syncope.   Pt denies polydipsia, polyuria, or new focal neuro s/s.    Pt denies fever, wt loss, night sweats, loss of appetite, or other constitutional symptoms  Denies urinary symptoms such as dysuria, frequency, urgency, flank pain, hematuria or n/v, fever, chills. Never started the repatha , has been trying to follow lower chol diet.   Wt Readings from Last 3 Encounters:  11/20/23 173 lb (78.5 kg)  11/20/23 173 lb 6.4 oz (78.7 kg)  09/03/23 172 lb (78 kg)   BP Readings from Last 3 Encounters:  11/20/23 130/78  11/20/23 130/78  09/03/23 (!) 147/89   Immunization History  Administered Date(s) Administered   PFIZER(Purple Top)SARS-COV-2 Vaccination 03/30/2019, 04/22/2019   Td 11/15/2009   There are no preventive care reminders to display for this patient.     Past Medical History:  Diagnosis Date   ANEMIA, MILD    CAD (coronary artery disease)    a. CAD,  b. s/p Cypher DES to the ramus and Promus DES x 2 to the RCA 08/2007 (minimal disease in LAD and CFX at that time), c. Myoview 8/10: EF 63%, inf thinning, small prior ant infarct, no ischemia;  d. s/p CABG 3/14 (L-LAD, RIMA-OM1)   DEGENERATIVE JOINT DISEASE, RIGHT HIP    DIVERTICULOSIS, COLON    ERECTILE DYSFUNCTION    GERD    Heart murmur    HTN (hypertension)    Hyperlipidemia    statin intolerant   HYPERPLASIA PROSTATE UNS W/O UR OBST & OTH LUTS    LUMBAR RADICULOPATHY, RIGHT    Other abnormal glucose    PAD (peripheral artery disease)    pre CABG 04/2012 => ABIs: Right 0.92,  left 0.61 // ABI 09/02/14: R 0.93; L 0.82   SPINAL STENOSIS, LUMBAR    Stroke (HCC)    vertebral artery dissection   Past Surgical History:  Procedure Laterality Date   COLONOSCOPY  02/07/2000   negative   COLONOSCOPY  08/2017   TA, Dr Legrand   CORONARY ARTERY BYPASS GRAFT N/A 04/22/2012   Procedure: CORONARY ARTERY BYPASS GRAFTING (CABG);  Surgeon: Elspeth JAYSON Millers, MD;  Location: [facility] OR;  Service: Open Heart Surgery;  Laterality: N/A;  CABG X 2, BIMA, POSSIBLE EVH   CORONARY STENT PLACEMENT  02/07/2008   X 2    Left heart catheterizations   CYSTOSCOPY WITH URETHRAL DILATATION N/A 09/03/2023   Procedure: CYSTOSCOPY, WITH URETHRAL DILATION;  Surgeon: Carolee Sherwood JONETTA DOUGLAS, MD;  Location: [facility] ORS;  Service: Urology;  Laterality: N/A;  OPTILUME DRUG COATED BALLOON   TEE WITHOUT CARDIOVERSION N/A 04/22/2012   Procedure: TRANSESOPHAGEAL ECHOCARDIOGRAM (TEE);  Surgeon: Elspeth JAYSON Millers, MD;  Location: [facility] OR;  Service: Open Heart Surgery;  Laterality: N/A;   TOTAL HIP ARTHROPLASTY Right 02/07/2007   UPPER GASTROINTESTINAL ENDOSCOPY      reports that he quit smoking about 38 years ago.  His smoking use included cigarettes. He started smoking about 50 years ago. He has a 18 pack-year smoking history. He has never used smokeless tobacco. He reports that he does not drink alcohol and does not use drugs. family history includes Diabetes in his maternal grandmother; Heart attack (age of onset: 80) in his father; Heart attack (age of onset: 26) in his brother; Hyperlipidemia in his brother and mother; Hypertension in his father; Pulmonary embolism in his brother; Stroke in his brother. Allergies  Allergen Reactions   Accupril [Quinapril Hcl] Cough   Ace Inhibitors Other (See Comments)    ACE inhibitors are contraindicated because of a history of rash with Angiotensin receptor blocker.   Calan  [Verapamil ] Other (See Comments)    Sun sensitivity   Crestor  [Rosuvastatin ] Other (See Comments)     Myalgias Cramps   Glucosamine Other (See Comments)    Unknown reaction   Lipitor [Atorvastatin] Other (See Comments)    Myalgias  Cramps   Livalo [Pitavastatin] Other (See Comments)    Cramps   Pravachol  [Pravastatin ] Other (See Comments)    Myalgias Cramps   Statins Other (See Comments)    Myalgias, cramping with: Crestor , Lipitor, Livalo, Pravachol .   Zetia  [Ezetimibe ] Other (See Comments)    Myalgias Cramps  Pt ok on 3x weekly dose   Benicar [Olmesartan] Rash   Current Outpatient Medications on File Prior to Visit  Medication Sig Dispense Refill   Multiple Vitamins-Minerals (CENTRUM SILVER 50+MEN) TABS Take 1 tablet by mouth daily.     nitroGLYCERIN  (NITROSTAT ) 0.4 MG SL tablet Take 1 tab under your tongue for chest pain  If no relief of pain may repeat NTG, one tab every 5 minutes up to 3 tablets total over 15 minutes. 25 tablet 3   No current facility-administered medications on file prior to visit.        ROS:  All others reviewed and negative.  Objective        PE:  BP 130/78 (BP Location: Left Arm, Patient Position: Sitting, Cuff Size: Normal)   Pulse 86   Temp 98.6 F (37 C) (Oral)   Ht 6' 1 (1.854 m)   Wt 173 lb (78.5 kg)   SpO2 98%   BMI 22.82 kg/m                 Constitutional: Pt appears in NAD               HENT: Head: NCAT.                Right Ear: External ear normal.                 Left Ear: External ear normal.                Eyes: . Pupils are equal, round, and reactive to light. Conjunctivae and EOM are normal               Nose: without d/c or deformity               Neck: Neck supple. Gross normal ROM               Cardiovascular: Normal rate and regular rhythm.                 Pulmonary/Chest: Effort normal and breath sounds without rales or wheezing.                Abd:  Soft, NT, ND, +  BS, no organomegaly               Neurological: Pt is alert. At baseline orientation, motor grossly intact               Skin: Skin is warm. No rashes,  no other new lesions, LE edema - none               Psychiatric: Pt behavior is normal without agitation   Micro: none  Cardiac tracings I have personally interpreted today:  none  Pertinent Radiological findings (summarize): none   Lab Results  Component Value Date   WBC 6.2 11/20/2023   HGB 12.7 (L) 11/20/2023   HCT 39.8 11/20/2023   PLT 254.0 11/20/2023   GLUCOSE 90 11/20/2023   CHOL 232 (H) 11/20/2023   TRIG 64.0 11/20/2023   HDL 62.60 11/20/2023   LDLDIRECT 130.4 11/17/2009   LDLCALC 156 (H) 11/20/2023   ALT 13 11/20/2023   AST 16 11/20/2023   NA 138 11/20/2023   K 4.1 11/20/2023   CL 102 11/20/2023   CREATININE 0.97 11/20/2023   BUN 17 11/20/2023   CO2 29 11/20/2023   TSH 2.62 11/20/2023   PSA 8.27 (H) 11/20/2023   INR 1.28 04/22/2012   HGBA1C 6.1 11/20/2023   Assessment/Plan:  Eric Dickerson is a 73 y.o. Black or African American [2] male with  has a past medical history of ANEMIA, MILD, CAD (coronary artery disease), DEGENERATIVE JOINT DISEASE, RIGHT HIP, DIVERTICULOSIS, COLON, ERECTILE DYSFUNCTION, GERD, Heart murmur, HTN (hypertension), Hyperlipidemia, HYPERPLASIA PROSTATE UNS W/O UR OBST & OTH LUTS, LUMBAR RADICULOPATHY, RIGHT, Other abnormal glucose, PAD (peripheral artery disease), SPINAL STENOSIS, LUMBAR, and Stroke (HCC).  Encounter for well adult exam with abnormal findings Age and sex appropriate education and counseling updated with regular exercise and diet Referrals for preventative services - none needed Immunizations addressed - declines all immunizations Smoking counseling  - none needed Evidence for depression or other mood disorder - none significant Most recent labs reviewed. I have personally reviewed and have noted: 1) the patient's medical and social history 2) The patient's current medications and supplements 3) The patient's height, weight, and BMI have been recorded in the chart   ANEMIA, MILD Also for f/u iron lab,  to f/u any  worsening symptoms or concerns  Elevated PSA Lab Results  Component Value Date   PSA 8.27 (H) 11/20/2023   PSA 6.55 (H) 03/24/2021   PSA 6.26 (H) 04/27/2020   Mild worsening, for f/u with urology as planned nov 3    HTN (hypertension) BP Readings from Last 3 Encounters:  11/20/23 130/78  11/20/23 130/78  09/03/23 (!) 147/89   Stable, pt to continue medical treatment norvasc  5 mg qd   Hyperlipidemia Lab Results  Component Value Date   LDLCALC 156 (H) 11/20/2023   uncontrolled, pt has been statin intolerant, declines zetia  or repatha , for lower choldiet   Low magnesium  levels Also for f/u lab today  Other abnormal glucose Lab Results  Component Value Date   HGBA1C 6.1 11/20/2023   Stable, pt to continue current medical treatment  - diet, wt control   Vitamin D  deficiency Last vitamin D  Lab Results  Component Value Date   VD25OH 30.66 03/28/2022   Low, to start oral replacement  Followup: Return in about 1 year (around 11/19/2024).  Lynwood Rush, MD 11/20/2023 8:35 PM [facility]

## 2023-11-20 NOTE — Assessment & Plan Note (Signed)
 Lab Results  Component Value Date   LDLCALC 156 (H) 11/20/2023   uncontrolled, pt has been statin intolerant, declines zetia  or repatha , for lower choldiet

## 2023-11-20 NOTE — Assessment & Plan Note (Addendum)
 Lab Results  Component Value Date   PSA 8.27 (H) 11/20/2023   PSA 6.55 (H) 03/24/2021   PSA 6.26 (H) 04/27/2020   Mild worsening, for f/u with urology as planned nov 3

## 2023-11-20 NOTE — Patient Instructions (Signed)
 Please continue all other medications as before, and refills have been done if requested.  Please have the pharmacy call with any other refills you may need.  Please continue your efforts at being more active, low cholesterol diet, and weight control.  You are otherwise up to date with prevention measures today.  Please keep your appointments with your specialists as you may have planned  Please call if you change your mind about starting the Repatha   Please go to the LAB at the blood drawing area for the tests to be done  You will be contacted by phone if any changes need to be made immediately.  Otherwise, you will receive a letter about your results with an explanation, but please check with MyChart first.  Please make an Appointment to return for your 1 year visit, or sooner if needed

## 2023-11-20 NOTE — Patient Instructions (Addendum)
 [name],  Thank you for taking the time for your Medicare Wellness Visit. I appreciate your continued commitment to your health goals. Please review the care plan we discussed, and feel free to reach out if I can assist you further.  Medicare recommends these wellness visits once per year to help you and your care team stay ahead of potential health issues. These visits are designed to focus on prevention, allowing your provider to concentrate on managing your acute and chronic conditions during your regular appointments.  Please note that Annual Wellness Visits do not include a physical exam. Some assessments may be limited, especially if the visit was conducted virtually. If needed, we may recommend a separate in-person follow-up with your provider.  Ongoing Care Seeing your primary care provider every 3 to 6 months helps us  monitor your health and provide consistent, personalized care.   Referrals If a referral was made during today's visit and you haven't received any updates within two weeks, please contact the referred provider directly to check on the status.  Recommended Screenings:  Health Maintenance  Topic Date Due   Pneumococcal Vaccine for age over 39 (1 of 2 - PCV) Never done   Zoster (Shingles) Vaccine (1 of 2) Never done   DTaP/Tdap/Td vaccine (2 - Tdap) 01/01/2024*   Medicare Annual Wellness Visit  11/19/2024   Hepatitis C Screening  Completed   Meningitis B Vaccine  Aged Out   Flu Shot  Discontinued   Colon Cancer Screening  Discontinued   COVID-19 Vaccine  Discontinued  *Topic was postponed. The date shown is not the original due date.       08/24/2023    2:39 PM  Advanced Directives  Does Patient Have a Medical Advance Directive? No  Would patient like information on creating a medical advance directive? No - Patient declined   Advance Care Planning is important because it: Ensures you receive medical care that aligns with your values, goals, and  preferences. Provides guidance to your family and loved ones, reducing the emotional burden of decision-making during critical moments.  Vision: Annual vision screenings are recommended for early detection of glaucoma, cataracts, and diabetic retinopathy. These exams can also reveal signs of chronic conditions such as diabetes and high blood pressure.  Dental: Annual dental screenings help detect early signs of oral cancer, gum disease, and other conditions linked to overall health, including heart disease and diabetes.

## 2024-11-21 ENCOUNTER — Encounter: Admitting: Internal Medicine

## 2024-11-21 ENCOUNTER — Ambulatory Visit
